# Patient Record
Sex: Female | Born: 1975 | Race: Black or African American | Hispanic: No | Smoking: Never smoker
Health system: Southern US, Community
[De-identification: ages and names within clinical notes are randomized; demographics above are authoritative.]

## PROBLEM LIST (undated history)

## (undated) ENCOUNTER — Inpatient Hospital Stay (HOSPITAL_COMMUNITY): Payer: Self-pay

## (undated) DIAGNOSIS — I8390 Asymptomatic varicose veins of unspecified lower extremity: Secondary | ICD-10-CM

## (undated) DIAGNOSIS — I472 Ventricular tachycardia, unspecified: Secondary | ICD-10-CM

## (undated) DIAGNOSIS — B999 Unspecified infectious disease: Secondary | ICD-10-CM

## (undated) DIAGNOSIS — I509 Heart failure, unspecified: Secondary | ICD-10-CM

## (undated) DIAGNOSIS — I4891 Unspecified atrial fibrillation: Secondary | ICD-10-CM

## (undated) DIAGNOSIS — J45909 Unspecified asthma, uncomplicated: Secondary | ICD-10-CM

## (undated) DIAGNOSIS — I4729 Other ventricular tachycardia: Secondary | ICD-10-CM

## (undated) DIAGNOSIS — I428 Other cardiomyopathies: Secondary | ICD-10-CM

## (undated) DIAGNOSIS — Z8719 Personal history of other diseases of the digestive system: Secondary | ICD-10-CM

## (undated) DIAGNOSIS — J42 Unspecified chronic bronchitis: Secondary | ICD-10-CM

## (undated) DIAGNOSIS — G43909 Migraine, unspecified, not intractable, without status migrainosus: Secondary | ICD-10-CM

## (undated) DIAGNOSIS — G4730 Sleep apnea, unspecified: Secondary | ICD-10-CM

## (undated) DIAGNOSIS — Z95810 Presence of automatic (implantable) cardiac defibrillator: Secondary | ICD-10-CM

## (undated) DIAGNOSIS — I5022 Chronic systolic (congestive) heart failure: Secondary | ICD-10-CM

## (undated) DIAGNOSIS — I10 Essential (primary) hypertension: Secondary | ICD-10-CM

## (undated) DIAGNOSIS — D649 Anemia, unspecified: Secondary | ICD-10-CM

## (undated) HISTORY — PX: REDUCTION MAMMAPLASTY: SUR839

## (undated) HISTORY — DX: Unspecified atrial fibrillation: I48.91

## (undated) HISTORY — DX: Migraine, unspecified, not intractable, without status migrainosus: G43.909

## (undated) HISTORY — DX: Heart failure, unspecified: I50.9

## (undated) HISTORY — DX: Unspecified infectious disease: B99.9

## (undated) HISTORY — DX: Unspecified asthma, uncomplicated: J45.909

## (undated) HISTORY — DX: Asymptomatic varicose veins of unspecified lower extremity: I83.90

## (undated) HISTORY — DX: Ventricular tachycardia: I47.2

## (undated) HISTORY — DX: Other ventricular tachycardia: I47.29

## (undated) HISTORY — PX: CARDIAC CATHETERIZATION: SHX172

## (undated) HISTORY — DX: Anemia, unspecified: D64.9

## (undated) HISTORY — DX: Other cardiomyopathies: I42.8

## (undated) HISTORY — DX: Ventricular tachycardia, unspecified: I47.20

## (undated) HISTORY — PX: CARDIAC DEFIBRILLATOR PLACEMENT: SHX171

## (undated) HISTORY — DX: Essential (primary) hypertension: I10

---

## 1993-07-27 HISTORY — PX: REDUCTION MAMMAPLASTY: SUR839

## 1994-03-05 HISTORY — PX: BREAST REDUCTION SURGERY: SHX8

## 1996-07-27 DIAGNOSIS — I8390 Asymptomatic varicose veins of unspecified lower extremity: Secondary | ICD-10-CM

## 1996-07-27 HISTORY — DX: Asymptomatic varicose veins of unspecified lower extremity: I83.90

## 1997-10-17 ENCOUNTER — Other Ambulatory Visit: Admission: RE | Admit: 1997-10-17 | Discharge: 1997-10-17 | Payer: Self-pay | Admitting: Obstetrics & Gynecology

## 1998-11-06 ENCOUNTER — Other Ambulatory Visit: Admission: RE | Admit: 1998-11-06 | Discharge: 1998-11-06 | Payer: Self-pay | Admitting: Obstetrics and Gynecology

## 1999-07-07 ENCOUNTER — Ambulatory Visit: Admission: RE | Admit: 1999-07-07 | Discharge: 1999-07-07 | Payer: Self-pay | Admitting: *Deleted

## 2000-01-08 ENCOUNTER — Ambulatory Visit (HOSPITAL_BASED_OUTPATIENT_CLINIC_OR_DEPARTMENT_OTHER): Admission: RE | Admit: 2000-01-08 | Discharge: 2000-01-08 | Payer: Self-pay | Admitting: Internal Medicine

## 2000-03-27 ENCOUNTER — Emergency Department (HOSPITAL_COMMUNITY): Admission: EM | Admit: 2000-03-27 | Discharge: 2000-03-27 | Payer: Self-pay | Admitting: *Deleted

## 2000-03-30 ENCOUNTER — Emergency Department (HOSPITAL_COMMUNITY): Admission: EM | Admit: 2000-03-30 | Discharge: 2000-03-30 | Payer: Self-pay | Admitting: Emergency Medicine

## 2000-03-30 ENCOUNTER — Encounter: Payer: Self-pay | Admitting: Emergency Medicine

## 2000-09-20 ENCOUNTER — Emergency Department (HOSPITAL_COMMUNITY): Admission: EM | Admit: 2000-09-20 | Discharge: 2000-09-20 | Payer: Self-pay | Admitting: Emergency Medicine

## 2000-12-23 ENCOUNTER — Encounter: Payer: Self-pay | Admitting: Emergency Medicine

## 2000-12-23 ENCOUNTER — Emergency Department (HOSPITAL_COMMUNITY): Admission: EM | Admit: 2000-12-23 | Discharge: 2000-12-23 | Payer: Self-pay | Admitting: Emergency Medicine

## 2001-10-25 ENCOUNTER — Other Ambulatory Visit: Admission: RE | Admit: 2001-10-25 | Discharge: 2001-10-25 | Payer: Self-pay | Admitting: Internal Medicine

## 2001-10-25 ENCOUNTER — Encounter: Admission: RE | Admit: 2001-10-25 | Discharge: 2001-10-25 | Payer: Self-pay | Admitting: Family Medicine

## 2001-11-29 ENCOUNTER — Encounter: Admission: RE | Admit: 2001-11-29 | Discharge: 2001-11-29 | Payer: Self-pay | Admitting: Sports Medicine

## 2001-12-15 ENCOUNTER — Encounter: Admission: RE | Admit: 2001-12-15 | Discharge: 2001-12-15 | Payer: Self-pay | Admitting: Family Medicine

## 2002-01-05 ENCOUNTER — Encounter: Admission: RE | Admit: 2002-01-05 | Discharge: 2002-01-05 | Payer: Self-pay | Admitting: Family Medicine

## 2002-01-25 ENCOUNTER — Encounter: Admission: RE | Admit: 2002-01-25 | Discharge: 2002-01-25 | Payer: Self-pay | Admitting: Family Medicine

## 2002-02-03 ENCOUNTER — Encounter: Admission: RE | Admit: 2002-02-03 | Discharge: 2002-02-03 | Payer: Self-pay | Admitting: Family Medicine

## 2002-02-13 ENCOUNTER — Encounter: Admission: RE | Admit: 2002-02-13 | Discharge: 2002-02-13 | Payer: Self-pay | Admitting: Family Medicine

## 2002-02-28 ENCOUNTER — Encounter: Admission: RE | Admit: 2002-02-28 | Discharge: 2002-02-28 | Payer: Self-pay | Admitting: Sports Medicine

## 2002-03-30 ENCOUNTER — Encounter: Admission: RE | Admit: 2002-03-30 | Discharge: 2002-03-30 | Payer: Self-pay | Admitting: Family Medicine

## 2002-04-07 ENCOUNTER — Encounter: Admission: RE | Admit: 2002-04-07 | Discharge: 2002-04-07 | Payer: Self-pay | Admitting: Family Medicine

## 2002-10-03 ENCOUNTER — Encounter: Admission: RE | Admit: 2002-10-03 | Discharge: 2002-10-03 | Payer: Self-pay | Admitting: Family Medicine

## 2002-10-04 ENCOUNTER — Encounter: Admission: RE | Admit: 2002-10-04 | Discharge: 2002-10-04 | Payer: Self-pay | Admitting: Family Medicine

## 2002-10-10 ENCOUNTER — Encounter: Admission: RE | Admit: 2002-10-10 | Discharge: 2002-10-10 | Payer: Self-pay | Admitting: Family Medicine

## 2002-10-16 ENCOUNTER — Ambulatory Visit (HOSPITAL_COMMUNITY): Admission: RE | Admit: 2002-10-16 | Discharge: 2002-10-16 | Payer: Self-pay | Admitting: Internal Medicine

## 2002-10-16 ENCOUNTER — Encounter: Payer: Self-pay | Admitting: Internal Medicine

## 2002-10-17 ENCOUNTER — Encounter: Admission: RE | Admit: 2002-10-17 | Discharge: 2002-10-17 | Payer: Self-pay | Admitting: Sports Medicine

## 2002-11-20 ENCOUNTER — Encounter: Admission: RE | Admit: 2002-11-20 | Discharge: 2003-02-18 | Payer: Self-pay | Admitting: Internal Medicine

## 2002-11-29 ENCOUNTER — Encounter: Admission: RE | Admit: 2002-11-29 | Discharge: 2002-11-29 | Payer: Self-pay | Admitting: Family Medicine

## 2003-01-04 ENCOUNTER — Encounter: Admission: RE | Admit: 2003-01-04 | Discharge: 2003-01-04 | Payer: Self-pay | Admitting: Family Medicine

## 2003-01-04 ENCOUNTER — Other Ambulatory Visit: Admission: RE | Admit: 2003-01-04 | Discharge: 2003-01-04 | Payer: Self-pay | Admitting: Family Medicine

## 2003-02-05 ENCOUNTER — Encounter: Admission: RE | Admit: 2003-02-05 | Discharge: 2003-02-05 | Payer: Self-pay | Admitting: Family Medicine

## 2003-03-15 ENCOUNTER — Encounter: Admission: RE | Admit: 2003-03-15 | Discharge: 2003-03-15 | Payer: Self-pay | Admitting: Family Medicine

## 2003-08-17 ENCOUNTER — Emergency Department (HOSPITAL_COMMUNITY): Admission: EM | Admit: 2003-08-17 | Discharge: 2003-08-17 | Payer: Self-pay

## 2003-08-20 ENCOUNTER — Encounter: Admission: RE | Admit: 2003-08-20 | Discharge: 2003-08-20 | Payer: Self-pay | Admitting: Family Medicine

## 2003-09-03 ENCOUNTER — Encounter: Admission: RE | Admit: 2003-09-03 | Discharge: 2003-09-03 | Payer: Self-pay | Admitting: Family Medicine

## 2003-09-12 ENCOUNTER — Ambulatory Visit (HOSPITAL_COMMUNITY): Admission: RE | Admit: 2003-09-12 | Discharge: 2003-09-12 | Payer: Self-pay | Admitting: Family Medicine

## 2003-09-12 ENCOUNTER — Encounter: Admission: RE | Admit: 2003-09-12 | Discharge: 2003-09-12 | Payer: Self-pay | Admitting: Family Medicine

## 2003-09-19 ENCOUNTER — Encounter: Payer: Self-pay | Admitting: Cardiology

## 2003-09-19 ENCOUNTER — Ambulatory Visit: Admission: RE | Admit: 2003-09-19 | Discharge: 2003-09-19 | Payer: Self-pay | Admitting: Internal Medicine

## 2003-09-27 ENCOUNTER — Encounter: Admission: RE | Admit: 2003-09-27 | Discharge: 2003-09-27 | Payer: Self-pay | Admitting: Family Medicine

## 2004-07-16 ENCOUNTER — Emergency Department (HOSPITAL_COMMUNITY): Admission: EM | Admit: 2004-07-16 | Discharge: 2004-07-16 | Payer: Self-pay | Admitting: Emergency Medicine

## 2005-01-06 ENCOUNTER — Encounter: Admission: RE | Admit: 2005-01-06 | Discharge: 2005-04-06 | Payer: Self-pay | Admitting: Internal Medicine

## 2005-07-27 HISTORY — PX: CARDIAC DEFIBRILLATOR PLACEMENT: SHX171

## 2006-01-24 DIAGNOSIS — I5022 Chronic systolic (congestive) heart failure: Secondary | ICD-10-CM

## 2006-01-24 DIAGNOSIS — I509 Heart failure, unspecified: Secondary | ICD-10-CM

## 2006-01-24 HISTORY — DX: Heart failure, unspecified: I50.9

## 2006-01-24 HISTORY — DX: Chronic systolic (congestive) heart failure: I50.22

## 2006-02-05 ENCOUNTER — Ambulatory Visit: Payer: Self-pay | Admitting: Internal Medicine

## 2006-02-05 ENCOUNTER — Inpatient Hospital Stay (HOSPITAL_COMMUNITY): Admission: EM | Admit: 2006-02-05 | Discharge: 2006-02-11 | Payer: Self-pay | Admitting: *Deleted

## 2006-02-05 ENCOUNTER — Encounter (INDEPENDENT_AMBULATORY_CARE_PROVIDER_SITE_OTHER): Payer: Self-pay | Admitting: Cardiology

## 2006-02-10 HISTORY — PX: INSERT / REPLACE / REMOVE PACEMAKER: SUR710

## 2006-03-03 ENCOUNTER — Ambulatory Visit: Payer: Self-pay

## 2006-04-29 ENCOUNTER — Encounter: Admission: RE | Admit: 2006-04-29 | Discharge: 2006-04-29 | Payer: Self-pay | Admitting: Internal Medicine

## 2006-07-13 ENCOUNTER — Ambulatory Visit: Payer: Self-pay | Admitting: Internal Medicine

## 2006-09-14 ENCOUNTER — Ambulatory Visit: Payer: Self-pay | Admitting: Internal Medicine

## 2007-06-28 ENCOUNTER — Ambulatory Visit: Payer: Self-pay | Admitting: Internal Medicine

## 2007-11-09 ENCOUNTER — Other Ambulatory Visit: Admission: RE | Admit: 2007-11-09 | Discharge: 2007-11-09 | Payer: Self-pay | Admitting: Obstetrics and Gynecology

## 2007-11-22 ENCOUNTER — Ambulatory Visit: Payer: Self-pay | Admitting: Internal Medicine

## 2008-04-06 ENCOUNTER — Ambulatory Visit: Payer: Self-pay | Admitting: Internal Medicine

## 2008-07-01 ENCOUNTER — Emergency Department (HOSPITAL_COMMUNITY): Admission: EM | Admit: 2008-07-01 | Discharge: 2008-07-01 | Payer: Self-pay | Admitting: Emergency Medicine

## 2008-07-10 ENCOUNTER — Ambulatory Visit: Payer: Self-pay | Admitting: Internal Medicine

## 2008-08-27 ENCOUNTER — Encounter: Payer: Self-pay | Admitting: Internal Medicine

## 2008-11-29 ENCOUNTER — Encounter: Payer: Self-pay | Admitting: Internal Medicine

## 2009-02-12 ENCOUNTER — Telehealth: Payer: Self-pay | Admitting: Internal Medicine

## 2009-08-27 DIAGNOSIS — I428 Other cardiomyopathies: Secondary | ICD-10-CM

## 2009-08-27 DIAGNOSIS — I4891 Unspecified atrial fibrillation: Secondary | ICD-10-CM | POA: Insufficient documentation

## 2009-08-27 DIAGNOSIS — I509 Heart failure, unspecified: Secondary | ICD-10-CM | POA: Insufficient documentation

## 2009-08-29 ENCOUNTER — Ambulatory Visit: Payer: Self-pay | Admitting: Internal Medicine

## 2009-08-30 DIAGNOSIS — J209 Acute bronchitis, unspecified: Secondary | ICD-10-CM

## 2009-09-10 ENCOUNTER — Encounter: Admission: RE | Admit: 2009-09-10 | Discharge: 2009-09-10 | Payer: Self-pay | Admitting: Internal Medicine

## 2009-12-16 ENCOUNTER — Encounter (INDEPENDENT_AMBULATORY_CARE_PROVIDER_SITE_OTHER): Payer: Self-pay | Admitting: *Deleted

## 2010-02-11 ENCOUNTER — Encounter: Payer: Self-pay | Admitting: Internal Medicine

## 2010-07-23 ENCOUNTER — Encounter (INDEPENDENT_AMBULATORY_CARE_PROVIDER_SITE_OTHER): Payer: Self-pay | Admitting: *Deleted

## 2010-08-25 ENCOUNTER — Encounter: Payer: Self-pay | Admitting: Internal Medicine

## 2010-08-28 NOTE — Assessment & Plan Note (Signed)
Summary: PC2  Medications Added TEKTURNA 300 MG TABS (ALISKIREN FUMARATE) Take one tablet by mouth daily DIOVAN HCT 320-25 MG TABS (VALSARTAN-HYDROCHLOROTHIAZIDE) Take one tablet by mouth once daily. FUROSEMIDE 20 MG TABS (FUROSEMIDE) 2 tablet two times a day ZANTAC 150 MG TABS (RANITIDINE HCL) once daily VITAMIN D (ERGOCALCIFEROL) 50000 UNIT CAPS (ERGOCALCIFEROL) 2 times per week FISH OIL   OIL (FISH OIL) 2 caps two times a day MULTIVITAMINS   TABS (MULTIPLE VITAMIN) UAD LOTREL 10-20 MG CAPS (AMLODIPINE BESY-BENAZEPRIL HCL) at bedtime DIGOXIN 0.25 MG TABS (DIGOXIN) Take one tablet by mouth daily CARVEDILOL 25 MG TABS (CARVEDILOL) Take 1 tablet by mouth once daily PROAIR HFA 108 (90 BASE) MCG/ACT AERS (ALBUTEROL SULFATE) UAD ALBUTEROL SULFATE (2.5 MG/3ML) 0.083% NEBU (ALBUTEROL SULFATE) UAD ZYRTEC ALLERGY 10 MG TABS (CETIRIZINE HCL) Take one tablet by mouth once daily. as needed ZITHROMAX TRI-PAK 500 MG TABS (AZITHROMYCIN) as directed      Allergies Added: ! PERCOCET  Visit Type:  Follow-up Primary Provider:  Kellie Shropshire, MD  CC:  chest pain.  History of Present Illness: [name] returns today for followup.  She is a very pleasant young woman with a history of nonischemic cardiomyopathy, class I-II congestive heart failure, status post ICD insertion.  She has a history of hypertensive heart disease.  She has moderate obesity.  She continues to gain weight.  She returns for followup.  She denies chest pain.  She has been bothered lately without URI type symptoms and now notes that she is coughing up brown thick sputum.  No fever or chills or night sweats.  Current Medications (verified): 1)  Tekturna 300 Mg Tabs (Aliskiren Fumarate) .... Take One Tablet By Mouth Daily 2)  Diovan Hct 320-25 Mg Tabs (Valsartan-Hydrochlorothiazide) .... Take One Tablet By Mouth Once Daily. 3)  Furosemide 20 Mg Tabs (Furosemide) .... 2 Tablet Two Times A Day 4)  Zantac 150 Mg Tabs (Ranitidine Hcl)  .... Once Daily 5)  Vitamin D (Ergocalciferol) 50000 Unit Caps (Ergocalciferol) .... 2 Times Per Week 6)  Fish Oil   Oil (Fish Oil) .... 2 Caps Two Times A Day 7)  Multivitamins   Tabs (Multiple Vitamin) .... Uad 8)  Lotrel 10-20 Mg Caps (Amlodipine Besy-Benazepril Hcl) .... At Bedtime 9)  Digoxin 0.25 Mg Tabs (Digoxin) .... Take One Tablet By Mouth Daily 10)  Carvedilol 25 Mg Tabs (Carvedilol) .... Take 1 Tablet By Mouth Once Daily 11)  Proair Hfa 108 (90 Base) Mcg/act Aers (Albuterol Sulfate) .... Uad 12)  Albuterol Sulfate (2.5 Mg/42ml) 0.083% Nebu (Albuterol Sulfate) .... Uad 13)  Zyrtec Allergy 10 Mg Tabs (Cetirizine Hcl) .... Take One Tablet By Mouth Once Daily. As Needed  Allergies (verified): 1)  ! Percocet  Past History:  Past Medical History: Last updated: 08/27/2009 Current Problems:  ATRIAL FIBRILLATION (ICD-427.31) AICD,  MEDTRONIC SPRINT QUATTRO SECURE, MODEL 5947 (ICD-V45.02) CHF (ICD-428.0) CARDIOMYOPATHY (ICD-425.4)    Past Surgical History: Last updated: 08/27/2009   DATE OF PROCEDURE:  02/10/2006  PROCEDURE PERFORMED: Implantation of AICD-Medtronic Sprint Quattro Secure, model 5947   PHYSICIAN:  Doylene Canning. Ladona Ridgel, M.D.  Review of Systems  The patient denies chest pain, syncope, and peripheral edema.    Vital Signs:  Patient profile:   35 year old female Height:      61 inches Weight:      245 pounds BMI:     46.46 Pulse rate:   97 / minute BP sitting:   158 / 102  (left arm)  Vitals Entered  By: Laurance Flatten CMA (August 29, 2009 3:11 PM)  Physical Exam  General:  Obese, well developed, well nourished, in no acute distress.  HEENT: normal. no maxillary tenderness. Neck: supple. No JVD. Carotids 2+ bilaterally no bruits Cor: RRR no rubs, gallops or murmur.  PMI is enlarged and laterally displaced. Lungs: CTA Ab: soft, nontender. nondistended. No HSM. Good bowel sounds Ext: warm. no cyanosis, clubbing or edema Neuro: alert and oriented. Grossly  nonfocal. affect pleasant     ICD Specifications Following MD:  Lewayne Bunting, MD     ICD Vendor:  Medtronic     ICD Model Number:  7232     ICD Serial Number:  [id number] ICD DOI:  02/10/2006     ICD Implanting MD:  Lewayne Bunting, MD  Lead 1:    Location: RV     DOI: 02/10/2006     Model #: 4098     Serial #: [id number]     Status: active  Indications::  NICM   ICD Follow Up Remote Check?  No Battery Voltage:  3.09 V     Charge Time:  8.44 seconds     ICD Dependent:  No       ICD Device Measurements Right Ventricle:  Amplitude: 17.1 mV, Impedance: 432 ohms, Threshold: 1.0 V at 0.5 msec Shock Impedance: 41/51 ohms   Episodes Shock:  0     ATP:  0     Nonsustained:  3      Brady Parameters Mode VVI     Lower Rate Limit:  40      Tachy Zones VF:  240     VT1:  182     Next Remote Date:  11/26/2009     Next Cardiology Appt Due:  08/27/2010 Tech Comments:  No parameter changes.  Device function normal.   Carelink transmissions every 3 months .  ROV 1 year Dr. Ladona Ridgel.  Checked by [employer]. Altha Harm, LPN  August 29, 2009 3:26 PM   MD Comments:  Agree with above.  Impression & Recommendations:  Problem # 1:  AICD,  MEDTRONIC SPRINT QUATTRO SECURE, MODEL 5947 (ICD-V45.02) Her device is working normally.  She has had no sustained VT or VF.  Will recheck in several months.  Problem # 2:  CHF (ICD-428.0) Continue current meds and maintain a low sodium diet. Her updated medication list for this problem includes:    Diovan Hct 320-25 Mg Tabs (Valsartan-hydrochlorothiazide) .Marland Kitchen... Take one tablet by mouth once daily.    Furosemide 20 Mg Tabs (Furosemide) .Marland Kitchen... 2 tablet two times a day    Lotrel 10-20 Mg Caps (Amlodipine besy-benazepril hcl) .Marland Kitchen... At bedtime    Digoxin 0.25 Mg Tabs (Digoxin) .Marland Kitchen... Take one tablet by mouth daily    Carvedilol 25 Mg Tabs (Carvedilol) .Marland Kitchen... Take 1 tablet by mouth once daily  Problem # 3:  ACUTE BRONCHITIS (ICD-466.0) Her symptoms are suggestive of  this.  She is scheduled to see her primary MD next week.  I will plan to treat her with a Z-Pack and ask that she followup with Dr. Renae Gloss next week as scheduled. Her updated medication list for this problem includes:    Proair Hfa 108 (90 Base) Mcg/act Aers (Albuterol sulfate) ..... Uad    Albuterol Sulfate (2.5 Mg/35ml) 0.083% Nebu (Albuterol sulfate) ..... Uad    Zithromax Tri-pak 500 Mg Tabs (Azithromycin) .Marland Kitchen... As directed  Patient Instructions: 1)  Your physician recommends that you schedule a follow-up appointment in: 12  months with Dr Ladona Ridgel Prescriptions: [name] TRI-PAK 500 MG TABS (AZITHROMYCIN) as directed  #1 x 0   Entered by:   Dennis Bast, RN, BSN   Authorized by:   Laren Boom, MD, [facility]   Signed by:   Dennis Bast, RN, BSN on 08/29/2009   Method used:   Electronically to        [facility] [phone]* (retail)       [address]       [address]       [place], [place]  [address]       Ph: [phone]       Fax: [phone]   RxID:   [id number]

## 2010-08-28 NOTE — Letter (Signed)
Summary: Device-Delinquent Phone Journalist, newspaper, Main Office  [address]   [place], [place] [address]   Phone: [phone]  Fax: [phone]     February 11, 2010 MRN: [id number]   [name] [address] [place], [place]  [address]   Dear Ms. [name],  According to our records, you were scheduled for a device phone transmission on 11-26-09.     We did not receive any results from this check.  If you transmitted on your scheduled day, please call us to help troubleshoot your system.  If you forgot to send your transmission, please send one upon receipt of this letter.  Thank you,   Architectural technologist [facility] Clinic

## 2010-08-28 NOTE — Letter (Signed)
Summary: Device-Delinquent Check  [facility], Main Office  [address]   [place], [place] [address]   Phone: [phone]  Fax: [phone]     July 23, 2010 MRN: [id number]   SHANIKIA KERNODLE [address] [place], [place]  [address]   Dear Ms. [name],  According to our records, you have not had your implanted device checked in the recommended period of time.  We are unable to determine appropriate device function without checking your device on a regular basis.  Please call our office to schedule an appointment as soon as possible.  If you are having your device checked by another physician, please call us so that we may update our records.  Thank you,  Vella Kohler  July 23, 2010 8:45 AM   [facility] certified

## 2010-08-28 NOTE — Letter (Signed)
Summary: Device-Delinquent Phone Journalist, newspaper, Main Office  [address]   [place], [place] [address]   Phone: [phone]  Fax: [phone]     Dec 16, 2009 MRN: [id number]   [name] [address] [place], [place]  [address]   Dear Ms. [name],  According to our records, you were scheduled for a device phone transmission on                              .     We did not receive any results from this check.  If you transmitted on your scheduled day, please call us to help troubleshoot your system.  If you forgot to send your transmission, please send one upon receipt of this letter.  Thank you,   Architectural technologist Device Clinic

## 2010-09-17 NOTE — Cardiovascular Report (Signed)
Summary: Certified Letter Returned (RTS - UTF)  Certified Letter Returned (RTS - UTF)   Imported By: Debby Freiberg 09/08/2010 11:25:41  _____________________________________________________________________  External Attachment:    Type:   Image     Comment:   External Document

## 2010-10-02 ENCOUNTER — Ambulatory Visit (INDEPENDENT_AMBULATORY_CARE_PROVIDER_SITE_OTHER): Payer: BC Managed Care – PPO | Admitting: Internal Medicine

## 2010-10-02 ENCOUNTER — Encounter: Payer: Self-pay | Admitting: Internal Medicine

## 2010-10-02 DIAGNOSIS — I428 Other cardiomyopathies: Secondary | ICD-10-CM

## 2010-10-02 DIAGNOSIS — E669 Obesity, unspecified: Secondary | ICD-10-CM

## 2010-10-02 DIAGNOSIS — I472 Ventricular tachycardia: Secondary | ICD-10-CM

## 2010-10-02 DIAGNOSIS — I5022 Chronic systolic (congestive) heart failure: Secondary | ICD-10-CM

## 2010-10-07 NOTE — Assessment & Plan Note (Signed)
Summary: FOLLOW UP - 1 YEAR/per pt call=mj  Medications Added BENICAR HCT 40-25 MG TABS (OLMESARTAN MEDOXOMIL-HCTZ) once daily FUROSEMIDE 20 MG TABS (FUROSEMIDE) 1 tablet two times a day VITAMIN D 2000 UNIT TABS (CHOLECALCIFEROL) once daily ZYRTEC ALLERGY 10 MG TABS (CETIRIZINE HCL) Take one tablet by mouth once daily. as needed MAGNESIUM OXIDE 400 MG TABS (MAGNESIUM OXIDE) once daily COENZYME Q10 100 MG CAPS (COENZYME Q10) once daily VITAMIN C 500 MG  TABS (ASCORBIC ACID) once daily        Visit Type:  Follow-up Primary Provider:  Kellie Shropshire, MD   History of Present Illness: Mrs. [name] returns today for followup. She is a pleasant woman with an DCM, severe LV dysfunction, class 2 CHF and obesity. She has not had any ICD shocks. She has been frustrated by her inabililty to lose weight. She has reduced her caloric intake and is trying to exercise several times a week. Minimal peripheral edema. She has had problems with sodium indiscretion in the past but now is trying to improve.  Current Medications (verified): 1)  Tekturna 300 Mg Tabs (Aliskiren Fumarate) .... Take One Tablet By Mouth Daily 2)  Benicar Hct 40-25 Mg Tabs (Olmesartan Medoxomil-Hctz) .... Once Daily 3)  Furosemide 20 Mg Tabs (Furosemide) .Marland Kitchen.. 1 Tablet Two Times A Day 4)  Zantac 150 Mg Tabs (Ranitidine Hcl) .... Once Daily 5)  Vitamin D 2000 Unit Tabs (Cholecalciferol) .... Once Daily 6)  Fish Oil   Oil (Fish Oil) .... 2 Caps Two Times A Day 7)  Multivitamins   Tabs (Multiple Vitamin) .... Uad 8)  Lotrel 10-20 Mg Caps (Amlodipine Besy-Benazepril Hcl) .... At Bedtime 9)  Digoxin 0.25 Mg Tabs (Digoxin) .... Take One Tablet By Mouth Daily 10)  Carvedilol 25 Mg Tabs (Carvedilol) .... Take 1 Tablet By Mouth Once Daily 11)  Proair Hfa 108 (90 Base) Mcg/act Aers (Albuterol Sulfate) .... Uad 12)  Albuterol Sulfate (2.5 Mg/70ml) 0.083% Nebu (Albuterol Sulfate) .... Uad 13)  Zyrtec Allergy 10 Mg Tabs (Cetirizine Hcl) ....  Take One Tablet By Mouth Once Daily. As Needed 14)  Magnesium Oxide 400 Mg Tabs (Magnesium Oxide) .... Once Daily 15)  Coenzyme Q10 100 Mg Caps (Coenzyme Q10) .... Once Daily 16)  Vitamin C 500 Mg  Tabs (Ascorbic Acid) .... Once Daily  Allergies: 1)  ! Percocet  Past History:  Past Medical History: Last updated: 08/27/2009 Current Problems:  ATRIAL FIBRILLATION (ICD-427.31) AICD,  MEDTRONIC SPRINT QUATTRO SECURE, MODEL 5947 (ICD-V45.02) CHF (ICD-428.0) CARDIOMYOPATHY (ICD-425.4)    Past Surgical History: Last updated: 08/27/2009   DATE OF PROCEDURE:  02/10/2006  PROCEDURE PERFORMED: Implantation of AICD-Medtronic Sprint Quattro Secure, model 5947   PHYSICIAN:  Doylene Canning. Ladona Ridgel, M.D.  Review of Systems       The patient complains of dyspnea on exertion.  The patient denies chest pain, syncope, and peripheral edema.    Vital Signs:  Patient profile:   35 year old female Height:      61 inches Weight:      253 pounds BMI:     47.98 Pulse rate:   74 / minute BP sitting:   144 / 86  (left arm)  Vitals Entered By: Laurance Flatten CMA (October 02, 2010 2:52 PM)  Physical Exam  General:  Obese, well developed, well nourished, in no acute distress.  HEENT: normal. no maxillary tenderness. Neck: supple. No JVD. Carotids 2+ bilaterally no bruits Cor: RRR no rubs, gallops or murmur.  PMI is enlarged and  laterally displaced. Lungs: CTA Ab: soft, nontender. nondistended. No HSM. Good bowel sounds Ext: warm. no cyanosis, clubbing or edema Neuro: alert and oriented. Grossly nonfocal. affect pleasant     ICD Specifications Following MD:  Lewayne Bunting, MD     ICD Vendor:  Medtronic     ICD Model Number:  7232     ICD Serial Number:  [id number] ICD DOI:  02/10/2006     ICD Implanting MD:  Lewayne Bunting, MD  Lead 1:    Location: RV     DOI: 02/10/2006     Model #: 4098     Serial #: [id number]     Status: active  Indications::  NICM   ICD Follow Up ICD Dependent:  No      Brady  Parameters Mode VVI     Lower Rate Limit:  40      Tachy Zones VF:  240     VT1:  182     MD Comments:  NOrmal device function.  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 Will follow. Her updated medication list for this problem includes:    Lotrel 10-20 Mg Caps (Amlodipine besy-benazepril hcl) .Marland Kitchen... At bedtime    Carvedilol 25 Mg Tabs (Carvedilol) .Marland Kitchen... Take 1 tablet by mouth once daily  Problem # 2:  AICD,  MEDTRONIC SPRINT QUATTRO SECURE, MODEL 5947 (ICD-V45.02) Her device is working normally. Will recheck in several months.  Problem # 3:  CHF (ICD-428.0) Her chronic systolic CHF is class 2. She will continue her current meds and maintain a low sodium diet. Her updated medication list for this problem includes:    Benicar Hct 40-25 Mg Tabs (Olmesartan medoxomil-hctz) ..... Once daily    Furosemide 20 Mg Tabs (Furosemide) .Marland Kitchen... 1 tablet two times a day    Lotrel 10-20 Mg Caps (Amlodipine besy-benazepril hcl) .Marland Kitchen... At bedtime    Digoxin 0.25 Mg Tabs (Digoxin) .Marland Kitchen... Take one tablet by mouth daily    Carvedilol 25 Mg Tabs (Carvedilol) .Marland Kitchen... Take 1 tablet by mouth once daily  Patient Instructions: 1)  Your physician wants you to follow-up in: 12 months with Dr Court Ireta will receive a reminder letter in the mail two months in advance. If you don't receive a letter, please call our office to schedule the follow-up appointment. 2)  Your physician recommends that you continue on your current medications as directed. Please refer to the Current Medication list given to you today.

## 2010-10-14 NOTE — Cardiovascular Report (Signed)
Summary: Office Visit   Office Visit   Imported By: Roderic Ovens 10/08/2010 11:48:32  _____________________________________________________________________  External Attachment:    Type:   Image     Comment:   External Document

## 2010-12-09 NOTE — Assessment & Plan Note (Signed)
[facility]                         ELECTROPHYSIOLOGY OFFICE NOTE   [name], [name]                       MRN:          [id number]  DATE:06/28/2007                            DOB:          09-26-1975    [name] returns today for followup visit.  She is a very pleasant,  young woman with history of severe hypertension and a hypertensive  cardiomyopathy with EF of 20%.  She returns today for followup.  She is  status post ICD insertion back in July of 2007.  She has recently  married and has joined a gym and has been exercising some.  Unfortunately, in the last year her weight has gone up almost 25 pounds.  She denies chest pain or shortness of breath.  She denies peripheral  edema.   PHYSICAL EXAMINATION:  GENERAL:  She is a pleasant, well-appearing,  obese woman in no acute distress.  VITAL SIGNS:  Blood pressure 170/110, pulse 80 and regular.  Respirations 18.  Weight 246 pounds.  The patient states that she has  been off her medications as she lost them.   MEDICATIONS:  1. Diovan/HCTZ 20/25.  2. Digoxin 0.25 mg daily.  3. Lotrel 10/80 mg.  4. Tekturna.  5. Carvedilol unknown dose.  She does not know it.  She does not have      her medicines with her today.   Interrogation of her defibrillator demonstrates a Medtronic Maximo with  R waves at 17.  The impedance was 464.  The threshold of 0.5.  Battery  voltage 3.19 volts.  Underlying rhythm is sinus.  No intercurrent  __________  .   IMPRESSION:  1. Nonischemic cardiomyopathy.  2. Congestive heart failure.  3. Severe hypertension.  4. Obesity.   DISCUSSION:  Overall [name] is stable but her blood pressure  remains elevated.  I suspect some noncompliance.  I have encouraged her  to continue exercising and we talked about the importance of not  overdoing it and not using heavy weights.  I will plan to see her back  in the office in one year.    Doylene Canning. Ladona Ridgel, MD  Electronically Signed   GWT/MedQ  DD: 06/28/2007  DT: 06/28/2007  Job #: [id number]   cc:   Merlene Laughter. Renae Gloss, M.D.

## 2010-12-09 NOTE — Assessment & Plan Note (Signed)
[facility]                         ELECTROPHYSIOLOGY OFFICE NOTE   NAME:[name], Briana MCQUAIG                       MRN:          [id number]  DATE:07/10/2008                            DOB:          04/26/1976    [name] returns today for followup.  She is a very pleasant young  woman with a history of nonischemic cardiomyopathy, class I-II  congestive heart failure, status post ICD insertion.  She has a history  of hypertensive heart disease.  She has moderate obesity.  She continues  to gain weight.  She returns for followup.  She denies chest pain.  She  denies shortness of breath.  She has rare palpitations which are almost  certainly PVCs and nonsustained VT.   MEDICATIONS:  1. Diovan/HCTZ 320/25 a day.  2. Digoxin 0.25 mg daily.  3. Lotrel 10/20 a day.  4. Tekturna 300 a day.  5. Zantac 150 a day.  6. Furosemide 20 twice a day.  7. Coreg 25 daily.  8. She is also on Cipro and Flagyl.   PHYSICAL EXAMINATION:  GENERAL:  She is a pleasant well-appearing young  woman in no distress.  VITAL SIGNS:  Blood pressure was 153/95, pulse 90 and regular, and  respirations were 18.  The weight was 240 pounds.  NECK:  No jugular venous distention.  LUNGS:  Clear bilaterally to auscultation.  No wheezes, rales, or  rhonchi are present.  There is no increased work of breathing.  CARDIOVASCULAR:  Regular rate and rhythm.  Normal S1 and S2.  There is  soft S4 gallop.  There are no murmurs.  ABDOMEN:  Soft and nontender.  There is no organomegaly.  EXTREMITIES:  No cyanosis, clubbing, or edema.  Thee pulses are 2+ and  symmetric.   Interrogation of her defibrillator demonstrates a Medtronic Maximo.  R-  waves were 17.  The impedance was 432.  The threshold was 1 volt at 0.5.  Battery voltage was 3.14 volts.  There is no intercurrent ICD therapies.   IMPRESSION:  1. Nonischemic cardiomyopathy.  2. Congestive heart failure.  3. Status post implantable  cardioverter-defibrillator insertion.   DISCUSSION:  Overall, [name] is stable.  She has had no syncopal  episodes.  Her blood pressures remains elevated.  We talked about ways  to try to lose weight and I have encouraged her to maintain a low-salt  diet.  I will see her back for ICD followup in 1 year.     Doylene Canning. Ladona Ridgel, MD  Electronically Signed    GWT/MedQ  DD: 07/10/2008  DT: 07/10/2008  Job #: [id number]

## 2010-12-12 NOTE — Op Note (Signed)
NAMEMEYER, DOCKERY                ACCOUNT NO.:  [id number]   MEDICAL RECORD NO.:  [id number]          PATIENT TYPE:  INP   LOCATION:  3731                         FACILITY:  [facility]   PHYSICIAN:  Doylene Canning. Ladona Ridgel, M.D.  DATE OF BIRTH:  [birth year]   DATE OF PROCEDURE:  02/10/2006  DATE OF DISCHARGE:                                 OPERATIVE REPORT   PROCEDURE PERFORMED:  Single chamber ICD implantation.   INDICATION:  Syncope with a nonischemic cardiomyopathy and EF of 25%.   INTRODUCTION:  The patient is a 35 year old woman with a history of severe  hypertension and congestive heart failure with EFs dating back to 2004 of 15-  20%.  The patient was in her usual state of health when she was admitted to  the hospital after having a syncopal episode.  She was unresponsive and  foaming at the mouth.  She had negative EEG which suggested that this was  not a seizure.  With her severe LV dysfunction and syncope, she is now  referred for ICD implantation. Of note, catheterization demonstrated no  coronary disease.   PROCEDURE:  After informed consent was obtained, the patient was taken to  the diagnostic EP lab in a fasting state.  After the usual preparation and  draping, intravenous fentanyl and midazolam were given for sedation.  30 mL  lidocaine was infiltrated in the left infraclavicular region.  An 8 cm  incision was carried out over this region.  Electrocautery was utilized to  dissect down to the fascial plane.  The left subclavian vein was  demonstrated be patent after 10 mL of contrast was injected into it.  The  Medtronic Sprint [place], model 5621, 65-cm defibrillation lead,  serial number [id number], was advanced into the right ventricle and mapping  was carried out at the final site on the RV apex.  The R-waves measured 30  mV where a pacing impedance of 699 ohms.  The lead was actively fixed.  The  pacing threshold was 0.5 volts at 0.5 milliseconds and 10 volts pacing  did  not stimulate the diaphragm.  With the ventricular lead in satisfactory  position, it was secured to the subpectoralis fascia with a figure-of-eight  silk suture.  The sewing sleeve was also secured with a silk suture.  Electrocautery was then utilized to make a subcutaneous pocket.  Kanamycin  irrigation was utilized to irrigate the pocket.  Electrocautery was utilized  to assure hemostasis.  The Medtronic Maximo VR, model [id number], single chamber  defibrillator, serial number [id number], was connected to the defibrillation  lead and placed in the subcutaneous pocket.  The generator was secured with  a silk suture.  The patient was more deeply sedated and defibrillation  threshold testing was carried out.   After the patient was more deeply sedated with fentanyl and Versed, VF was  induced with a T-wave shock and a 15 joule shock was delivered which  terminated VF and restored sinus rhythm.  Five minutes was allowed to elapse  and a second defibrillation threshold test carried out.  Again, VF was  induced with T-wave shock and a 15 joules shock was delivered, this time  failing to terminate VF.  The device recharged and delivered a 25 joule  shock which did, in fact, terminate VF and restored sinus rhythm.  At this  point, no additional defibrillation threshold testing was carried out and  the incision was closed with a layer of 2-0 Vicryl followed by a layer of 3-  0 Vicryl followed by a layer of 4-0 Vicryl.  Benzoin was painted on the  skin, Steri-Strips were applied, a pressure dressing was placed, and the  patient was returned to her room in satisfactory condition.   COMPLICATIONS:  There were no immediate procedure complications.   RESULTS:  This demonstrates successful implantation of a Medtronic single  chamber defibrillator in a patient with syncope and nonischemic  cardiomyopathy, EF 20%.           ______________________________  Doylene Canning. Ladona Ridgel, M.D.     GWT/MEDQ  D:   02/10/2006  T:  02/10/2006  Job:  [id number]   cc:   Cristy Hilts. Jacinto Halim, MD  Fax: [phone]   Merlene Laughter. Renae Gloss, M.D.  Fax: [phone]

## 2010-12-12 NOTE — Assessment & Plan Note (Signed)
[facility]                           ELECTROPHYSIOLOGY OFFICE NOTE   Briana, [name]                       MRN:          [id number]  DATE:03/03/2006                            DOB:          1976/07/27    [name] was seen today in the clinic on March 03, 2006 for a wound  check of her newly implanted Medtronic model [id number] Maximo. Date of implant  was February 10, 2006 for nonischemic cardiomyopathy.   On interrogation of her device today, her battery voltage is 3.23 with a  charge time of 8.51 seconds. R waves measured 16.6 millivolts with a  ventricular pacing threshold of 1 volt at 0.4 milliseconds and a ventricular  lead impedance of 368. Shock impedance was 39. There were no episodes since  implant date. I did program onset on, her Steri-Strips were removed, her  wound was without redness or edema. She was enrolled in [facility] today and  will return in November to see Dr. Ladona Ridgel.                                   Altha Harm, LPN                                Doylene Canning. Ladona Ridgel, MD   PO/MedQ  DD:  03/03/2006  DT:  03/03/2006  Job #:  [id number]

## 2010-12-12 NOTE — Procedures (Signed)
EEG number [id number]     This is a 35 year old African-American female with a history of new-onset  seizure on February 04, 2006.  The patient has a history of congestive heart  failure, asthma, hypertension and obesity.  Medicines in the past include  Advair Diskus, Coreg, digoxin, Diovan, Lotrel and nitroglycerin.   TECHNICAL DESCRIPTION:  This EEG was recorded during the awake and during  the stage II sleep states.  The background activity shows 0 Hz rhythms with  higher amplitudes seen in the posterior head regions bilaterally.  There was  no evidence of any focal asymmetry present during this EEG.  Photic  stimulation was performed, which did not produce a driving response in the  occipital head regions.  No evidence of any focal asymmetry was seen.  There  was stage II sleep.  Hyperventilation testing was performed with no  abnormalities seen.   IMPRESSION:  This is a normal EEG during the awake and stage II sleep  states.           ______________________________  Genene Churn. Sandria Manly, M.D.     [id number]  D:  02/05/2006 14:02:56  T:  02/05/2006 15:56:45  Job #:  [id number]   cc:   [facility]

## 2010-12-12 NOTE — Discharge Summary (Signed)
NAMEMARA, [name]                ACCOUNT NO.:  [id number]   MEDICAL RECORD NO.:  [id number]          PATIENT TYPE:  INP   LOCATION:  3706                         FACILITY:  [facility]   PHYSICIAN:  Lonia Blood, M.D.      DATE OF BIRTH:  12-11-1975   DATE OF ADMISSION:  02/04/2006  DATE OF DISCHARGE:  02/11/2006                                 DISCHARGE SUMMARY   PRIMARY CARE PHYSICIAN:  Dr. Andi Devon.   DISCHARGE DIAGNOSES:  1.  Seizure versus syncope presume.  2.  Arrhythmias status post ICD insertion on February 11, 2006.  3.  Nonischemic cardiomyopathy with an EF of 25 to 30%.  4.  Iron deficiency anemia.  5.  Class 3 CHF.  6.  Asthma which is stable.  7.  Dyslipidemia.  8.  Hypertension.  9.  Transient hypokalemia.  10. Obesity.   DISCHARGE MEDICATIONS:  1.  Multivitamin daily.  2.  Digoxin 250 mcg daily.  3.  AcipHex 20 mg daily.  4.  Coreg CR 40 mg daily.  5.  Lotrel 10/20 one tablet daily.  6.  Tekturna 150 mg p.o. daily.  7.  Advair 50/100 one inhalation twice a day.  8.  Albuterol inhaler as needed.  9.  Albuterol nebulizer also as needed.  10. Diovan HCT 320 25 mg one tablet daily.  11. The patient also on Lasix 20 mg daily.  12. Aldactone 25 mg daily.  13. Nu-Iron 150 mg daily.   DISPOSITION:  The patient has a new ICD placed.  She will have a followup  with EP in about three weeks.  She will also have a followup with Dr.  Renae Gloss as an outpatient.   PROCEDURES PERFORMED THIS ADMISSION:  1.  Head CT without contrast performed on February 04, 2006 showed negative for      acute ethmoid sinus inflammatory changes.  2.  2D echo February 05, 2006 showed an EF of about 25%.  Left ventricular wall      thickness was increased and a pattern of severe concentric hypertrophy.      Features consistent with a normal left ventricular-filling pattern with      concomitant abnormal early detection and increased filling pressure.  3.  An ultrasound of the abdomen on February 06, 2006 showed no acute      abnormality.  There was probably tiny gall bladder polyp.  4.  A chest x-ray February 10, 2006 showed stable cardiomegaly, no acute      disease.  5.  Cardiac cath performed on February 08, 2006 showed normal coronary anatomy      and normal renal arteries.  6.  Insertion of ICD performed on February 10, 2006.  Currently stable.   CONSULTATIONS:  [facility] and EP.  Dr. Lewayne Bunting.   HISTORY AND PHYSICAL:  Please refer to my dictated history and physical on  February 05, 2006.  This is a pleasant 34 year old female with known  cardiomyopathy and asthma who presented with sudden onset of observed tonic  clonic seizure and altered mental  status on February 05, 2006.  Episode occurred  abruptly while she was having what she considered an acute asthma attack.  The patient was seen in the ED.  She was awake, alert at the time and no  postictal symptoms.  Based on her advance cardiomyopathy, it was not clear  whether this was a true seizure since she had no history of seizure disorder  or a syncopal episode manifested in a seizure.  She was subsequently  admitted for further workup.   HOSPITAL COURSE:  1.  Seizure, syncope.  The patient's workup was complete.  She had prolactin      level.  She had a head CT, et Karie Soda checked and no evidence of real      seizure.  EEG done was also negative.  However, with her dilated      cardiomyopathy, it is presumed the patient most likely had what we call      sudden cardiac death.  Probably she went into some form of arrhythmia      related to her passing out and then having syncope.  With this in mind,      she was evaluated.  She had a cath done that is clean.  Subsequently, EP      was asked to see patient.  An ICD is placed based on her very low EF of      25% and her symptomatology.  Hopefully this will keep the patient out of      trouble.  2.  Non-ischemic cardiomyopathy.  The patient's EF is somewhere  around 25%.      She is, however, compensated at this point.  Further adjustment to her      medication was done including increasing her Coreg dose now to 80 mg      daily of the CR.  The patient was originally on 40 mg.  She also had      Lasix and Aldactone added based on her EF.  Now she has an ICD in place.  3.  Iron-deficiency anemia.  The patient had a workup done here for her      anemia that was observed on admission.  It appears that she has iron      deficiency with the following findings:  Her retic count is only 1.9%,      total iron is only 14.  Percent saturation was only 4%.  Her TIBC normal      is at 353.  Ferritin is only 21.  In fact, she has been started on iron      therapy this hospitalization.  Her hemoglobin has been fairly consistent      around 10 at this point.  4.  Asthma.  The patient was maintained on her home medication with no      change.  She did not have any asthma attack here.  She was on steroid at      home but we took her off that.  5.  Dyslipidemia.  She is currently not on treatment.  Her fasting lipid      panel is as follows:  Total cholesterol of 201, triglycerides 38, HDL      42, her LDL is 151.  The patient will need to be on some form of statin      hopefully as an outpatient to help with control of her lipids although      she has no ischemic cardiomyopathy at this point.  The patient is      otherwise stable for discharge.      Lonia Blood, M.D.  Electronically Signed     LG/MEDQ  D:  02/11/2006  T:  02/11/2006  Job:  [id number]   cc:   Merlene Laughter. Renae Gloss, M.D.  Fax: [phone]

## 2010-12-12 NOTE — H&P (Signed)
[name], [name]                ACCOUNT NO.:  [id number]   MEDICAL RECORD NO.:  [id number]          PATIENT TYPE:  INP   LOCATION:  3731                         FACILITY:  [facility]   PHYSICIAN:  Lonia Blood, M.D.      DATE OF BIRTH:  1976-07-03   DATE OF ADMISSION:  02/04/2006  DATE OF DISCHARGE:                                HISTORY & PHYSICAL   PRIMARY CARE PHYSICIAN:  Merlene Laughter. Renae Gloss, M.D.   PRESENTING COMPLAINT:  Seizure.   HISTORY OF THE PRESENT ILLNESS:  The patient is a 35 year old female with a  history of hypertension and CHF with systolic dysfunction who apparently was  doing all right until today when she started having shortness of breath.  The patient  actually has had shortness of breath that has been going on for  about a week, which she believed was due to her asthma.  She called her  primary care physician who started her on a Prednisone Pak today.  She was  having problems breathing with coughing and one of her friends tapped her  back .  That was the last thing the patient remembers before she passed out.  Along with her weakness the patient was having tonic clonic activity.  Her  mouth was foaming, and she had clenched jaws and teeth.  They had to pry her  mouth open to prevent her from biting her.  A friend actually got their hand  bitten due to that.  She was subsequently brought into the emergency room.   The patient is currently fully awake and alert, and able to give a history  on her own.  The patient denies any fever or any palpitations.  She has  baseline shortness of breath that has not changed.  She currently is not  wheezing.   PAST MEDICAL HISTORY:  The patient's past medical history is significant  for:  1.  CHF with an EF of 10-15% back in 2004; currently it is in the 40s.  She      is followed by Dr. Jacinto Halim.  2.  The patient has a history of  hypertension diagnosed at the age of 80      and she has been on treatment since that time.  3.   History of asthma.  4.  Obesity.   ALLERGIES:  The patient is ALLERGIC TO PERCOCET.   MEDICATIONS:  1.  AcipHex 20 mg daily.  2.  Advair Diskus 250/50, 1 puff twice a day.  3.  Albuterol inhaler.  4.  Coreg 40 mg daily.  5.  Digoxin 250 mcg daily.  6.  Diovan/hydrochlorothiazide 320/25 mg daily.  7.  Lotrel 10/20 daily.  8.  Multivitamins.  9.  Prednisone Pak.   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  No tobacco or alcohol usage.  She has been fairly active and has  been increasing her activities slowly.   FAMILY HISTORY:  The family history is significant for hypertension and also  heart disease.   REVIEW OF SYSTEMS:  The  12-point review of systems is performed and is  notable for that which is mentioned in the history of present illness.   PHYSICAL EXAMINATION:  VITAL SIGNS: On physical exam temperature is 97.1,  blood pressure 195/123, pulse 78, respiratory rate 20, and O2 sat  93% on  room air.  GENERAL APPEARANCE:  Generally she is alert, awake and oriented, and able to  give a history; and, she is in no acute distress.  HEENT:  The patient has bilateral subconjunctival hemorrhages on the lateral  aspect of the eyeball.  She seems to have an exophthalmos kind of look.  PERL.  EOMI.  No visible  tear on her tongue.  No  visible scratches.  NECK:  The neck is supple.  No JVD.  No lymphadenopathy.  CHEST AND LUNGS:  Respiratory wise she has good air entry bilaterally.  There are no audible wheezes.  HEART:  Cardiovascular system showed S1 and S2 with no audible murmurs.  ABDOMEN:  The abdomen is soft and nontender with positive bowel sounds.  EXTREMITIES:  The extremities show trace edema, but no cyanosis.   LABORATORY DATA:  Initial labs shows sodium 141, potassium 3.5, chloride  104, BUN 16, glucose 143, bicarb 27, creatinine 0.9.  Hemoglobin 12.  Pregnancy test is negative.  Head CT without contrast is negative for  anything  acute.   ASSESSMENT:  This is a 35 year old female with a history of congestive heart  failure who presents with what appears to be absence seizure.  The patient  may also have had a syncope episode primarily as the differential.  It is  not clear as to what the trigger was.  She has no prior history of seizure.  I suspect that this could be hypoxic-related versus hypertensive margin,  especially with her blood pressure being 195/123 upon arrival; and, it may  have been more than 200 at the time of the episode.   PLAN:  The plan therefore will be:  1.  Seizure versus syncope.   We will admit the patient for at least a 23-hour observation period.  We  will rule out cardiac cause by placing her on telemetry and monitoring her  telemetry.  We will check her cardiac enzymes serially.  We will repeat the  electrocardiogram, which at this point shows normal sinus rhythm with some  evidence of left atrial enlargement and some flattening of the lateral T  waves.  We will also check an electroencephalogram and serum prolactin level  to rule out possible seizure.  We will place her on seizure precautions for  now.   1.  Congestive heart failure.  The patient's B-natriuretic peptide is more than 1400; however, it is not  clear if this is her baseline B-natriuretic peptide.   We will ask Dr. Verl Dicker office regarding this.  I will keep her on her home  medications and consider  placing her on some baseline Lasix to help with  her blood pressure and her congestive heart failure.   1.  Asthma.  The patient is not wheezing at his point so I suspect this may not be  triggered by her asthma.   I therefore will not give her any steroids, except for her Advair Diskus.   1.  Hypertension.  The patient's blood pressure has come down some with treatment.   I will continue her home medications and I will add nitroglycerin.  We will monitor her ventriculoperitoneal closely.   1.  The patient's other  medical problems seem to be stable.   I will follow the patient closely while she is in the hospital.      Lonia Blood, M.D.  Electronically Signed     LG/MEDQ  D:  02/05/2006  T:  02/05/2006  Job:  [id number]

## 2010-12-12 NOTE — Cardiovascular Report (Signed)
NAMEALIVIYA, SCHOELLER                ACCOUNT NO.:  [id number]   MEDICAL RECORD NO.:  [id number]          PATIENT TYPE:  INP   LOCATION:  3731                         FACILITY:  [facility]   PHYSICIAN:  Cristy Hilts. Jacinto Halim, MD       DATE OF BIRTH:  May 10, 1976   DATE OF PROCEDURE:  02/08/2006  DATE OF DISCHARGE:                              CARDIAC CATHETERIZATION   PROCEDURE PERFORMED:  1. Left and right heart catheterization.  2. Abdominal aortogram.   INDICATIONS:  [name] is a 35 year old female with history of  hypertension and cardiomyopathy with ejection fraction on 20% by  echocardiogram done about 2 to 3 years ago. Her EF about 6 to 8 months ago  had improved to 45%. However, she was readmitted to the hospital on February 05, 2006 with episode of seizure and syncope. It was felt that this was probably  related to VT VF arrest. She had an echocardiogram which again revealed  severe LV systolic dysfunction. Given this, she was brought to the cardiac  catheterization lab for left and right heart catheterization to evaluate for  reversible causes for her cardiomyopathy. The right heart catheterization  was performed to evaluate for cardiac output and cardiac index and also to  evaluate for pulmonary hypertension given her morbid obesity and obstructive  sleep apnea. Abdominal aortogram was performed to evaluate for renal artery  stenosis as she had difficult to control hypertension.   HEMODYNAMIC DATA:  Right heart catheterization:  RA pressures were 12/10  with a mean of 9 mmHg.   RV pressures 38/3 with an end diastolic pressure of 5 mmHg.   PA pressures 43/72 with a mean of 27 mmHg.   Pulmonary capillary wedge pressures 14/13 with a mean of 13 mmHg.   PA saturation was 73% and FA saturation was 95%.   Cardiac output was 6.0 with a cardiac index of 3.1 by Fick method.   HEMODYNAMIC DATA:  Left heart catheterization hemodynamic data:  The left  ventricular pressures were 151/3 with  end diastolic pressure of 9 mmHg. The  aortic pressure was 157/92 with a mean of 119 mmHg. There was no pressure  gradient across the aortic valve.   ANGIOGRAPHIC DATA:  Left ventricle:  The left ventricle is mildly dilated.  Left ventricular systolic function is markedly depressed with ejection  fraction of around 30 to 35% with marked global hypokinesis. No significant  mitral regurgitation.   CORONARY ARTERIES:  Right coronary artery:  The right coronary artery is a  large dominant vessel which is normal.   Left main:  The left main is normal.   Circumflex:  The circumflex is a large caliber vessel and is normal.   Left anterior descending:  The LAD is a large caliber vessel. It is smooth  and normal.   ABDOMINAL AORTOGRAM:  The abdominal aortogram revealed the presence of 2  renal arteries, 1 on either side. The left renal artery was not well  visualized at the initial injection, but the second injection revealed  widely patent renal arteries. Both right and left renal arteries  are widely  patent.   IMPRESSION:  1. Findings consistent with nonischemic, mildly dilated cardiomyopathy.      This is probably related to hypertension with hypertensive heart      disease.  2. Severe left ventricular systolic dysfunction, ejection fraction around      35% with moderate to marked global hypokinesis.  3. Moderate pulmonary hypertension.  4. Preserved cardiac output and cardiac index.   RECOMMENDATIONS:  1. Based on the data, she may benefit from secondary prevention of sudden      cardiac death. EP consultation has been obtained, and Lewayne Bunting has      been appraised of the situation. Further recommendations will follow      from the electrophysiologic input.  2. Continued therapy for hypertension and for nonischemic cardiomyopathy      as indicated.   TECHNIQUES OF THE PROCEDURE:  Under sterile precautions using a 6-French R  femoral arterial access and a 7-French R femoral  venous access, left and  right heart catheterization was performed .   Initially, right heart catheterization was performed by advancing a balloon  tip Swan-Ganz catheter into the pulmonary artery, and pulmonary capillary  wedge was easily obtained. Right heart hemodynamics were carefully measured.  Then, the catheter was pulled out of the body in the usual fashion.   Left heart catheterization:  Using a 6-French multipurpose B2 the catheter  was advanced in the ascending aorta with over a 0.035 j-wire. The catheter  was gently advanced, and the left ventricular pressures were monitored. Hand  contrast injection was performed of the left ventricle was performed, both  in the LAO and RAO projection. The catheter was flushed with saline and  pulled back into the ascending aorta, and pressure gradient across the  aortic valve was monitored.   Right coronary artery was selectively engaged angiography was performed. In  a similar fashion, the left main coronary artery was selectively engaged  angiography was performed. Then the catheter was pulled back in the  abdominal aorta, and abdominal aortogram was performed. Then, the catheter  was pulled out of the body in usual fashion. The patient to the procedure  well.  A total of 120 cc of contrast was utilized for diagnostic  angiography.      Cristy Hilts. Jacinto Halim, MD  Electronically Signed    JRG/MEDQ  D:  02/08/2006  T:  02/08/2006  Job:  [id number]   cc:   Merlene Laughter. Renae Gloss, M.D.  Doylene Canning. Ladona Ridgel, M.D.

## 2010-12-12 NOTE — Assessment & Plan Note (Signed)
[facility]                         ELECTROPHYSIOLOGY OFFICE NOTE   DANEL, STUDZINSKI                       MRN:          [id number]  DATE:07/13/2006                            DOB:          [birth year]    [name] returns today for followup.  She is a pleasant, 35 year old  woman with a history of severe hypertension and hypertensive  cardiomyopathy and an EF of 20% who is status post ICD insertion.  She  has had a history of syncope in the past as well.  She denies chest pain  or shortness of breath.  She does admit to some peripheral edema.  She  has had some compliance issues with her blood pressure medications.  I  suspect there was an issue of sodium indiscretion as well.   EXAM:  She was a pleasant, young woman in no distress.  Blood pressure was 170/100.  The pulse was 85 and regular.  Respirations  were 18 and the weight was 225 pounds.  NECK:  Revealed no jugular venous distention.  There was no thyromegaly.  Trachea was midline.  The carotids were 2+ and symmetric.  LUNGS:  Clear bilaterally to auscultation.  No wheezes, rales, or  rhonchi.  CARDIOVASCULAR:  Regular rate and rhythm with a normal S1, S2.  There is  a S4 gallops present.  EXTREMITIES:  Demonstrated no edema.   Interrogation of her defibrillator demonstrates a Medtronic Maximo with  R-waves of 17 and impedence of 456 ohms and a pacing threshold of 1.5 V  at 0.5 msec.  The battery voltage is 3.2 V.  There were no intercurrent  ICD therapies.   IMPRESSION:  1. Nonischemic cardiomyopathy.  2. Congestive heart failure, class II.  3. Severe hypertension.  4. History of syncope.   DISCUSSION:  Overall, [name] is stable.  Her defibrillator is  working normally.  She does not have one of the [employer] leads but  has a Oncologist lead which has not been a recall lead.  Today, I changed her from once a day Coreg to twice a day Coreg to help  with the  medical expenses.  We will see her back in the ER for ICD  followup.     Doylene Canning. Ladona Ridgel, MD     GWT/MedQ  DD: 07/13/2006  DT: 07/13/2006  Job #: [id number]   cc:   Cristy Hilts. Jacinto Halim, MD

## 2011-01-01 ENCOUNTER — Encounter: Payer: BC Managed Care – PPO | Admitting: *Deleted

## 2011-01-06 ENCOUNTER — Encounter: Payer: Self-pay | Admitting: *Deleted

## 2011-03-05 ENCOUNTER — Ambulatory Visit (INDEPENDENT_AMBULATORY_CARE_PROVIDER_SITE_OTHER): Payer: BC Managed Care – PPO | Admitting: Internal Medicine

## 2011-03-05 ENCOUNTER — Telehealth: Payer: Self-pay | Admitting: Internal Medicine

## 2011-03-05 ENCOUNTER — Encounter: Payer: Self-pay | Admitting: Internal Medicine

## 2011-03-05 ENCOUNTER — Encounter: Payer: Self-pay | Admitting: *Deleted

## 2011-03-05 VITALS — BP 182/104 | HR 83 | Ht 61.5 in | Wt 265.0 lb

## 2011-03-05 DIAGNOSIS — I10 Essential (primary) hypertension: Secondary | ICD-10-CM

## 2011-03-05 DIAGNOSIS — I509 Heart failure, unspecified: Secondary | ICD-10-CM

## 2011-03-05 DIAGNOSIS — I5022 Chronic systolic (congestive) heart failure: Secondary | ICD-10-CM

## 2011-03-05 MED ORDER — CARVEDILOL 25 MG PO TABS: 25.0000 mg | ORAL_TABLET | Freq: Every day | ORAL | Status: DC

## 2011-03-05 MED ORDER — POTASSIUM CHLORIDE CRYS ER 20 MEQ PO TBCR: 20.0000 meq | EXTENDED_RELEASE_TABLET | Freq: Every day | ORAL | Status: DC

## 2011-03-05 MED ORDER — FUROSEMIDE 20 MG PO TABS: ORAL_TABLET | ORAL | Status: DC

## 2011-03-05 NOTE — Telephone Encounter (Signed)
Per pt call, pt has gained 14 or 15lbs recently 10lbs in the last two days. Pt is also having trouble breathing and would like to speak to nurse.   Pt talked to on site nurse at work who gave pt a breathing treatment, b/c they thought it was pt asthma and not sure if it was fluid retention from pt CHF. Pt wanted to get an appt today, yet I told her there were no openings today. Please return pt call to advise/discuss.

## 2011-03-05 NOTE — Patient Instructions (Signed)
Your physician recommends that you schedule a follow-up appointment in: 3 weeks with dr taylor  Your physician recommends that you return for lab work in: Monday 03-09-11 AND NURSE ROOM VISIT FOR WEIGHT  INCREASE FUROSEMIDE 40MG  TAKE ONE AND ONE HALF TABLET TWICE DAILY X 4 DAYS THEN ONE TABLET TWICE DAILY  START KLOR-CON 20 MEQ ONE TABLET ONCE DAILY

## 2011-03-05 NOTE — Progress Notes (Signed)
HPI [name] returns today for followup. She is a pleasant, morbidly obese woman with a nonischemic cardiomyopathy and chronic systolic heart failure.  The patient has gained approximately 15 pounds in the last 3-4 weeks. She had been working hard to lose weight until about a month ago. She notes that over the last 2 weeks she has had increasing dyspnea on exertion, peripheral edema, and orthopnea. She denies chest pain. She has not had syncope in her ICD has not gone off. She denies indiscretion with sodium. She has taken her medications. Allergies  Allergen Reactions  . Oxycodone-Acetaminophen      Current Outpatient Prescriptions  Medication Sig Dispense Refill  . aliskiren (TEKTURNA) 300 MG tablet Take 300 mg by mouth daily.        Marland Kitchen amLODipine-benazepril (LOTREL) 10-20 MG per capsule Take 1 capsule by mouth daily.        . carvedilol (COREG) 25 MG tablet Take 1 tablet (25 mg total) by mouth daily.  30 tablet  12  . cetirizine (ZYRTEC) 10 MG tablet Take 10 mg by mouth daily.        . cholecalciferol (VITAMIN D) 1000 UNITS tablet Take 1,000 Units by mouth daily.        . digoxin (LANOXIN) 0.25 MG tablet Take 250 mcg by mouth daily.        . furosemide (LASIX) 20 MG tablet One and one half tablets twice daily x 4 days then one tablet twice daily  60 tablet  12  . multivitamin (THERAGRAN) per tablet Take 1 tablet by mouth daily.        Marland Kitchen olmesartan-hydrochlorothiazide (BENICAR HCT) 40-25 MG per tablet Take 1 tablet by mouth daily.        Marland Kitchen omega-3 fish oil (MAXEPA) 1000 MG CAPS capsule Take by mouth daily.        . ranitidine (ZANTAC) 150 MG capsule Take 150 mg by mouth daily.        . potassium chloride SA (KLOR-CON M20) 20 MEQ tablet Take 1 tablet (20 mEq total) by mouth daily.  30 tablet  12     No past medical history on file.  ROS:   All systems reviewed and negative except as noted in the HPI.   No past surgical history on file.   No family history on file.   History     Social History  . Marital Status: Legally Separated    Spouse Name: N/A    Number of Children: N/A  . Years of Education: N/A   Occupational History  . Not on file.   Social History Main Topics  . Smoking status: Never Smoker   . Smokeless tobacco: Never Used  . Alcohol Use: No  . Drug Use: No  . Sexually Active: Not on file   Other Topics Concern  . Not on file   Social History Narrative  . No narrative on file     BP 182/104  Pulse 83  Ht 5' 1.5" (1.562 m)  Wt 265 lb (120.203 kg)  BMI 49.26 kg/m2  Physical Exam:  anxious appearing morbidly obese woman, NAD HEENT: Unremarkable Neck:  7 cm JVD, no thyromegally Lymphatics:  No adenopathy Back:  No CVA tenderness Lungs:  Rales bilaterally one third up. No wheezes or rhonchi. HEART:  Regular rate rhythm, no murmurs, no rubs, no clicks Abd:  Morbidly obese, soft, positive bowel sounds, no organomegally, no rebound, no guarding Ext:  2 plus pulses, 3+ edema bilaterally, no cyanosis,  no clubbing Skin:  No rashes no nodules Neuro:  CN II through XII intact, motor grossly intact  EKG Normal sinus rhythm with left ventricular hypertrophy  Assess/Plan:

## 2011-03-05 NOTE — Assessment & Plan Note (Signed)
Her blood pressure is elevated today. She denies noncompliance. After we increased her to maintain a low-sodium diet. When she is euvolemic I suspect her pressure will improve.

## 2011-03-05 NOTE — Assessment & Plan Note (Signed)
Her chronic systolic heart failure is worsened. She is edematous. She is not acutely ill appearing however. She appears to again 10-15 pounds. Today I have asked that the patient increase her Lasix to 120 mg daily taken in divided doses. She will do this for 4 days. Have given her a potassium supplement to take as well. I will have her come back in several days to check electrolytes. She is instructed to maintain a low-sodium diet.

## 2011-03-05 NOTE — Telephone Encounter (Signed)
Pt calls today with a weight gain of 6 pounds overnight. Today weight is 266.  She reports her weight has steadily increased over the last few days. She is now sleeping on 3 pillows ( 2 were the norm)  And is still not resting well. She is short of breath with exertion and "sweating profusely".  Pt states " by the time I walk 30 feet from my desk to the phone I feel like I have ran 1/2 a mile".  Pt does have increased edema of the feet and legs making it difficult to wear shoes.  She was given a breathing treatment yesterday and today b/c thought it might be her asthma. This has not helped.  She is taking all medications as prescribed. Mylo Red RN

## 2011-03-05 NOTE — Telephone Encounter (Signed)
Pt will come in today for 4 pm appt with Dr. Ernst Breach RN

## 2011-03-06 ENCOUNTER — Telehealth: Payer: Self-pay | Admitting: Internal Medicine

## 2011-03-06 DIAGNOSIS — I5022 Chronic systolic (congestive) heart failure: Secondary | ICD-10-CM

## 2011-03-06 MED ORDER — FUROSEMIDE 40 MG PO TABS: ORAL_TABLET | ORAL | Status: DC

## 2011-03-06 NOTE — Telephone Encounter (Signed)
Pt was given furosemide and she thought the dose was increased and when she went to Pharm the dose had not changed so she was wondering what needs to happen. She uses [facility] on [address]

## 2011-03-06 NOTE — Telephone Encounter (Signed)
Refilled Patient's Lasix for 40 mg twice daily.

## 2011-03-09 ENCOUNTER — Ambulatory Visit (INDEPENDENT_AMBULATORY_CARE_PROVIDER_SITE_OTHER): Payer: BC Managed Care – PPO

## 2011-03-09 ENCOUNTER — Encounter: Payer: Self-pay | Admitting: *Deleted

## 2011-03-09 DIAGNOSIS — I10 Essential (primary) hypertension: Secondary | ICD-10-CM

## 2011-03-09 DIAGNOSIS — I5022 Chronic systolic (congestive) heart failure: Secondary | ICD-10-CM

## 2011-03-09 LAB — BASIC METABOLIC PANEL
Calcium: 9.2 mg/dL (ref 8.4–10.5)
Chloride: 94 mEq/L — ABNORMAL LOW (ref 96–112)
Creatinine, Ser: 1.1 mg/dL (ref 0.4–1.2)
Sodium: 139 mEq/L (ref 135–145)

## 2011-03-09 NOTE — Progress Notes (Signed)
Pt here for blood pressure check after Lasix dosage increased. Pt does report feeling better and edema is down. Weight is down to 254 (loss of 11 pounds). Pt states she has experienced some cramping in her back when she turns a certain way. Pt will also have a BMP drawn today.

## 2011-03-09 NOTE — Progress Notes (Signed)
Addended by: Keitha Butte on: 03/09/2011 01:43 PM   Modules accepted: Orders

## 2011-03-09 NOTE — Patient Instructions (Signed)
Please continue with medication plan as instructed by Dr Ladona Ridgel. Please call our office if edema or weight gain increases.

## 2011-03-16 ENCOUNTER — Encounter: Payer: Self-pay | Admitting: Internal Medicine

## 2011-03-18 ENCOUNTER — Encounter: Payer: Self-pay | Admitting: Internal Medicine

## 2011-03-18 ENCOUNTER — Ambulatory Visit (INDEPENDENT_AMBULATORY_CARE_PROVIDER_SITE_OTHER): Payer: BC Managed Care – PPO | Admitting: Internal Medicine

## 2011-03-18 VITALS — BP 146/88 | HR 75 | Ht 61.5 in | Wt 259.0 lb

## 2011-03-18 DIAGNOSIS — I472 Ventricular tachycardia: Secondary | ICD-10-CM

## 2011-03-18 DIAGNOSIS — I428 Other cardiomyopathies: Secondary | ICD-10-CM

## 2011-03-18 DIAGNOSIS — I10 Essential (primary) hypertension: Secondary | ICD-10-CM

## 2011-03-18 DIAGNOSIS — I509 Heart failure, unspecified: Secondary | ICD-10-CM

## 2011-03-18 NOTE — Assessment & Plan Note (Signed)
Her blood pressure is not optimally controlled. Additional medication up titration will be recommended over the course of the next few months. I have again encouraged her to maintain a low-sodium diet.

## 2011-03-18 NOTE — Assessment & Plan Note (Signed)
She has had no recurrent ventricular arrhythmias since her last visit. She'll continue her current medical therapy.

## 2011-03-18 NOTE — Progress Notes (Signed)
HPI [name] returns today for followup. She is a very pleasant 35 year old morbidly obese woman with nonischemic cardiomyopathy, hypertension, and chronic systolic heart failure. When I saw her last approximately 2 weeks ago, she was volume overloaded and dyspneic. We up titrated her Lasix to 80 mg twice a day for 4 days and her weight decreased over 10 pounds. Her symptoms were much improved. After that she went back to 40 mg twice a day and over the last few days has increased her weight approximately 3 pounds. She feels slightly more dyspneic than she was at her best but still better than she was last saw her back 2 weeks ago. She has had no ICD shocks. She denies dietary noncompliance. Allergies  Allergen Reactions  . Oxycodone-Acetaminophen      Current Outpatient Prescriptions  Medication Sig Dispense Refill  . aliskiren (TEKTURNA) 300 MG tablet Take 300 mg by mouth daily.        Marland Kitchen amLODipine-benazepril (LOTREL) 10-20 MG per capsule Take 1 capsule by mouth daily.        . carvedilol (COREG) 25 MG tablet Take 1 tablet (25 mg total) by mouth daily.  30 tablet  12  . cetirizine (ZYRTEC) 10 MG tablet Take 10 mg by mouth daily.        . Cholecalciferol (VITAMIN D) 2000 UNITS tablet Take 2,000 Units by mouth daily.        . digoxin (LANOXIN) 0.25 MG tablet Take 250 mcg by mouth daily.        . furosemide (LASIX) 40 MG tablet Take one tablet twice daily.  60 tablet  6  . Magnesium 250 MG TABS Take 1 tablet by mouth daily.        . multivitamin (THERAGRAN) per tablet Take 1 tablet by mouth daily.        Marland Kitchen olmesartan-hydrochlorothiazide (BENICAR HCT) 40-25 MG per tablet Take 1 tablet by mouth daily.        Marland Kitchen omega-3 fish oil (MAXEPA) 1000 MG CAPS capsule Take by mouth daily.        . potassium chloride SA (KLOR-CON M20) 20 MEQ tablet Take 1 tablet (20 mEq total) by mouth daily.  30 tablet  12  . ranitidine (ZANTAC) 150 MG capsule Take 150 mg by mouth daily.        . vitamin C (ASCORBIC  ACID) 500 MG tablet Take 500 mg by mouth daily.           Past Medical History  Diagnosis Date  . Other primary cardiomyopathies   . Atrial fibrillation   . Paroxysmal ventricular tachycardia   . CHF (congestive heart failure)   . Hypertension   . Acute bronchitis and bronchiolitis     ROS:   All systems reviewed and negative except as noted in the HPI.   Past Surgical History  Procedure Date  . Insert / replace / remove pacemaker  02/10/2006    Status post implantable cardioverter-defibrillator insertion  .Marland KitchenMarland KitchenMarland KitchenThe Medtronic Maximo VR, model 7232, single chamber  . Cardiac catheterization     EF 30-35%     Family History  Problem Relation Age of Onset  . Hypertension    . Heart disease       History   Social History  . Marital Status: Legally Separated    Spouse Name: N/A    Number of Children: N/A  . Years of Education: N/A   Occupational History  . [employer]   Social History Main  Topics  . Smoking status: Never Smoker   . Smokeless tobacco: Never Used  . Alcohol Use: No  . Drug Use: No  . Sexually Active: Not on file   Other Topics Concern  . Not on file   Social History Narrative  . No narrative on file     BP 146/88  Pulse 75  Ht 5' 1.5" (1.562 m)  Wt 259 lb (117.482 kg)  BMI 48.15 kg/m2  Physical Exam:  Morbidly obese appearing NAD HEENT: Unremarkable Neck:  No JVD, no thyromegally Lymphatics:  No adenopathy Back:  No CVA tenderness Lungs:  Clear except for rales in the bases bilaterally. Well healed ICD incision. HEART:  Regular rate rhythm, no murmurs, no rubs, no clicks. R. Sounds are somewhat distant. Abd:  soft, positive bowel sounds, no organomegally, no rebound, no guarding Ext:  2 plus pulses, 1+ edema, no cyanosis, no clubbing Skin:  No rashes no nodules Neuro:  CN II through XII intact, motor grossly intact  DEVICE  Normal device function.  See PaceArt for details.   Assess/Plan:

## 2011-03-18 NOTE — Assessment & Plan Note (Signed)
Her chronic systolic heart failure is improved but still in need of additional medication titration. I recommended she increase her Lasix to 60 mg twice a day. Additional adjustments will be made as needed. My plan will be to refer her for advanced heart failure clinic for additional evaluation. Her young age makes her a consideration for heart transplant evaluation.

## 2011-03-18 NOTE — Patient Instructions (Signed)
Remote monitoring is used to monitor your Pacemaker of ICD from home. This monitoring reduces the number of office visits required to check your device to one time per year. It allows [place] to keep an eye on the functioning of your device to ensure it is working properly. You are scheduled for a device check from home on 06/19/11. You may send your transmission at any time that day. If you have a wireless device, the transmission will be sent automatically. After your physician reviews your transmission, you will receive a postcard with your next transmission date.  Your physician wants you to follow-up in: 1 year with Dr. Ladona Ridgel. You will receive a reminder letter in the mail two months in advance. If you don't receive a letter, please call our office to schedule the follow-up appointment.  You are being referred to Dr. Gala Romney for Heart Failure. We will contact you about this appointment.

## 2011-04-13 ENCOUNTER — Ambulatory Visit (HOSPITAL_COMMUNITY)
Admission: RE | Admit: 2011-04-13 | Discharge: 2011-04-13 | Disposition: A | Discharge status: Home / Self Care | Payer: BC Managed Care – PPO | Source: Ambulatory Visit | Attending: Internal Medicine | Admitting: Internal Medicine

## 2011-04-13 VITALS — BP 138/88 | HR 90 | Resp 18 | Wt 255.4 lb

## 2011-04-13 DIAGNOSIS — I5022 Chronic systolic (congestive) heart failure: Secondary | ICD-10-CM

## 2011-04-13 LAB — BASIC METABOLIC PANEL
CO2: 37 mEq/L — ABNORMAL HIGH (ref 19–32)
Calcium: 10 mg/dL (ref 8.4–10.5)
Chloride: 99 mEq/L (ref 96–112)
GFR calc Af Amer: >60 mL/min (ref >60)
Glucose, Bld: 110 mg/dL — ABNORMAL HIGH (ref 70–99)
Potassium: 3.9 mEq/L (ref 3.5–5.1)
Sodium: 143 mEq/L (ref 135–145)

## 2011-04-13 MED ORDER — SPIRONOLACTONE 25 MG PO TABS: 12.5000 mg | ORAL_TABLET | Freq: Every day | ORAL | Status: DC

## 2011-04-13 NOTE — Assessment & Plan Note (Addendum)
NYHA III. Volume status elevated. Dyspnea on exertion. Will start spironolactone 12.5mg  daily. Stop potassium. She is instructed to wear ted hose when out of bed. Check BMET and BNP today. ECHO performed at bedside by Dr Gala Romney EF 50-55%.  Formal ECHO ordered. Encouraged to keep exercising and weigh daily.  Follow up in 2 weeks.

## 2011-04-13 NOTE — Progress Notes (Signed)
HPI:  35 year old morbidly obese woman with nonischemic cardiomyopathy, hypertension,  chronic systolic heart failure and obesity.   Last ECHO 2007 25% EF   Cardiac Cath 2007 -Findings consistent with nonischemic, mildly dilated cardiomyopathy. This is probably related to hypertension with hypertensive heart disease. Severe left ventricular systolic dysfunction, ejection fraction around 35% with moderate to marked global hypokinesis. Moderate pulmonary hypertension. Preserved cardiac output and cardiac index.   She is referred by Dr Ladona Ridgel due to her young age and possible need for advanced therapies.  She has been titrating Lasix up to 80 mg  BID for the last 2 weeks but she still has lower extremity edema. She does not have compression stockings.  Weight at home 247-253.   She sleeps on 3 pillows at night.  She has CPAP but she does not use it . She joined [employer] 03/30/2011. .Now following a low salt diet.  She is walking but she is tired.  SOB walking up steps. SOB on exertion.  Abdominal bloating.   No shocks.     ROS: All systems negative except as listed in HPI, PMH and Problem List.  Past Medical History  Diagnosis Date  . Other primary cardiomyopathies   . Atrial fibrillation   . Paroxysmal ventricular tachycardia   . CHF (congestive heart failure)   . Hypertension   . Acute bronchitis and bronchiolitis     Current Outpatient Prescriptions  Medication Sig Dispense Refill  . aliskiren (TEKTURNA) 300 MG tablet Take 300 mg by mouth daily.        Marland Kitchen amLODipine-benazepril (LOTREL) 10-20 MG per capsule Take 1 capsule by mouth daily.        . carvedilol (COREG) 25 MG tablet Take 1 tablet (25 mg total) by mouth daily.  30 tablet  12  . cetirizine (ZYRTEC) 10 MG tablet Take 10 mg by mouth daily.        . Cholecalciferol (VITAMIN D) 2000 UNITS tablet Take 2,000 Units by mouth daily.        . digoxin (LANOXIN) 0.25 MG tablet Take 250 mcg by mouth daily.        . furosemide  (LASIX) 40 MG tablet Take one tablet twice daily.  60 tablet  6  . Magnesium 250 MG TABS Take 1 tablet by mouth daily.        . multivitamin (THERAGRAN) per tablet Take 1 tablet by mouth daily.        Marland Kitchen olmesartan-hydrochlorothiazide (BENICAR HCT) 40-25 MG per tablet Take 1 tablet by mouth daily.        Marland Kitchen omega-3 fish oil (MAXEPA) 1000 MG CAPS capsule Take by mouth daily.        . potassium chloride SA (KLOR-CON M20) 20 MEQ tablet Take 1 tablet (20 mEq total) by mouth daily.  30 tablet  12  . ranitidine (ZANTAC) 150 MG capsule Take 150 mg by mouth daily.        . vitamin C (ASCORBIC ACID) 500 MG tablet Take 500 mg by mouth daily.           PHYSICAL EXAM: Filed Vitals:   04/13/11 1446  BP: 138/88  Pulse: 90  Resp: 18   General:  Well appearing. No resp difficulty HEENT: normal Neck: supple. JVP 6 Carotids 2+ bilaterally; no bruits. No lymphadenopathy or thryomegaly appreciated. Cor: PMI normal. Regular rate & rhythm. No rubs, gallops or murmurs. Lungs: clear Abdomen: soft, nontender, distended and obeses. No hepatosplenomegaly. No bruits or masses. Good bowel sounds.  Extremities: no cyanosis, clubbing, rash, 2+ lower extremity  edema Neuro: alert & orientedx3, cranial nerves grossly intact. Moves all 4 extremities w/o difficulty. Affect pleasant.       ASSESSMENT & PLAN:  Patient seen and examined with Tonye Becket, NP. We discussed all aspects of the encounter. I agree with the assessment and plan as stated above.

## 2011-04-13 NOTE — Patient Instructions (Signed)
Stop Potassium Start Spironolactone 25 mg 1/2 tab daily  Lab today  Your physician has requested that you have an echocardiogram. Echocardiography is a painless test that uses sound waves to create images of your heart. It provides your doctor with information about the size and shape of your heart and how well your heart's chambers and valves are working. This procedure takes approximately one hour. There are no restrictions for this procedure.  Your physician recommends that you schedule a follow-up appointment in: 2 weeks

## 2011-04-24 ENCOUNTER — Telehealth: Payer: Self-pay | Admitting: Internal Medicine

## 2011-04-24 NOTE — Telephone Encounter (Signed)
Refill of furosimide 80 mg was increased to  two tabs twice a day, uses [facility] [address] pt has almost run out

## 2011-04-27 ENCOUNTER — Ambulatory Visit (HOSPITAL_COMMUNITY)
Admission: RE | Admit: 2011-04-27 | Discharge: 2011-04-27 | Disposition: A | Discharge status: Home / Self Care | Payer: BC Managed Care – PPO | Source: Ambulatory Visit | Attending: Internal Medicine | Admitting: Internal Medicine

## 2011-04-27 VITALS — BP 122/84 | HR 66 | Wt 253.2 lb

## 2011-04-27 DIAGNOSIS — I509 Heart failure, unspecified: Secondary | ICD-10-CM | POA: Insufficient documentation

## 2011-04-27 DIAGNOSIS — I5022 Chronic systolic (congestive) heart failure: Secondary | ICD-10-CM

## 2011-04-27 DIAGNOSIS — I517 Cardiomegaly: Secondary | ICD-10-CM

## 2011-04-27 DIAGNOSIS — I10 Essential (primary) hypertension: Secondary | ICD-10-CM | POA: Insufficient documentation

## 2011-04-27 DIAGNOSIS — M7989 Other specified soft tissue disorders: Secondary | ICD-10-CM | POA: Insufficient documentation

## 2011-04-27 DIAGNOSIS — E785 Hyperlipidemia, unspecified: Secondary | ICD-10-CM | POA: Insufficient documentation

## 2011-04-27 LAB — BASIC METABOLIC PANEL
Chloride: 97 mEq/L (ref 96–112)
Creatinine, Ser: 1.16 mg/dL — ABNORMAL HIGH (ref 0.50–1.10)
GFR calc Af Amer: 69 mL/min — ABNORMAL LOW (ref >90)
Potassium: 4.4 mEq/L (ref 3.5–5.1)

## 2011-04-27 MED ORDER — FUROSEMIDE 80 MG PO TABS: 80.0000 mg | ORAL_TABLET | Freq: Two times a day (BID) | ORAL | Status: DC

## 2011-04-27 NOTE — Progress Notes (Signed)
HPI:  35 year old morbidly obese woman with nonischemic cardiomyopathy, hypertension,  chronic systolic heart failure and obesity.   Last ECHO 2007 25% EF   Cardiac Cath 2007 -Findings consistent with nonischemic, mildly dilated cardiomyopathy. This is probably related to hypertension with hypertensive heart disease. Severe left ventricular systolic dysfunction, ejection fraction around 35% with moderate to marked global hypokinesis. Moderate pulmonary hypertension. Preserved cardiac output and cardiac index.  04/13/2011 BMET Potassium 3.9, BUN 20, Creatinine 0.99 BNP 192 ECHO 04/27/2011 45-50% She is here for follow up. Complaining of leg cramps.   She continues to take Lasix 80 mg BID.  She has a prescription for compression stockings but has not purchased.   Weight at home 244-249. Over the last few days she has been 244.   She sleeps on 32pillows at night. She has CPAP but she does not use it regularly. She joined [employer] and she is participating in [employer]. .Now following a low salt diet.  She is walking but has started to jog.  Walking up steps. Denies SOB.   No shocks.     ROS: All systems negative except as listed in HPI, PMH and Problem List.  Past Medical History  Diagnosis Date  . Other primary cardiomyopathies   . Atrial fibrillation   . Paroxysmal ventricular tachycardia   . CHF (congestive heart failure)   . Hypertension   . Acute bronchitis and bronchiolitis     Current Outpatient Prescriptions  Medication Sig Dispense Refill  . aliskiren (TEKTURNA) 300 MG tablet Take 300 mg by mouth daily.        Marland Kitchen amLODipine-benazepril (LOTREL) 10-20 MG per capsule Take 1 capsule by mouth daily.        . carvedilol (COREG) 25 MG tablet Take 1 tablet (25 mg total) by mouth daily.  30 tablet  12  . cetirizine (ZYRTEC) 10 MG tablet Take 10 mg by mouth daily.        . Cholecalciferol (VITAMIN D) 2000 UNITS tablet Take 2,000 Units by mouth daily.        . digoxin  (LANOXIN) 0.25 MG tablet Take 250 mcg by mouth daily.        . furosemide (LASIX) 40 MG tablet Take one tablet twice daily.  60 tablet  6  . Magnesium 250 MG TABS Take 1 tablet by mouth daily.        . multivitamin (THERAGRAN) per tablet Take 1 tablet by mouth daily.        Marland Kitchen olmesartan-hydrochlorothiazide (BENICAR HCT) 40-25 MG per tablet Take 1 tablet by mouth daily.        Marland Kitchen omega-3 fish oil (MAXEPA) 1000 MG CAPS capsule Take by mouth daily.        . ranitidine (ZANTAC) 150 MG capsule Take 150 mg by mouth daily.        Marland Kitchen spironolactone (ALDACTONE) 25 MG tablet Take 0.5 tablets (12.5 mg total) by mouth daily.  30 tablet  6  . vitamin C (ASCORBIC ACID) 500 MG tablet Take 500 mg by mouth daily.           PHYSICAL EXAM: Filed Vitals:   04/27/11 1522  BP: 122/84  Pulse: 66   General:  Well appearing. No resp difficulty HEENT: normal Neck: supple. JVP 6 Carotids 2+ bilaterally; no bruits. No lymphadenopathy or thryomegaly appreciated. Cor: PMI normal. Regular rate & rhythm. No rubs, gallops or murmurs. Lungs: clear Abdomen: soft, nontender, distended and obese. No hepatosplenomegaly. No bruits or masses. Good  bowel sounds. Extremities: no cyanosis, clubbing, rash,  1+lower extremity  edema Neuro: alert & orientedx3, cranial nerves grossly intact. Moves all 4 extremities w/o difficulty. Affect pleasant.       ASSESSMENT & PLAN:

## 2011-04-27 NOTE — Patient Instructions (Signed)
Stop taking tecturna.  Continue to weigh and record daily.  Please follow up in 2 weeks.

## 2011-04-27 NOTE — Assessment & Plan Note (Addendum)
NYHA III. Volume status elevated. Compliant with medications. ECHO today. 45-50% Grade 1 diastolic dysfunction.  She is currently on ARB/ ACE/Tekturna and spironolactone. Will d/c Tecturna now and plan to d/c  ACE-I or ARB down the road as ACE/ARB/spiro combination is contraindicated. Check BMET today and in two weeks.  Follow up in two weeks for medication management. Will need to watch BP.   Patient seen and examined with Tonye Becket, NP. We discussed all aspects of the encounter. I agree with the assessment and plan as stated above.

## 2011-04-30 LAB — URINALYSIS, ROUTINE W REFLEX MICROSCOPIC
Bilirubin Urine: NEGATIVE
Nitrite: NEGATIVE
Protein, ur: 30 mg/dL — AB
Specific Gravity, Urine: 1.013 (ref 1.005–1.030)
Urobilinogen, UA: 0.2 mg/dL (ref 0.0–1.0)

## 2011-04-30 LAB — POCT I-STAT, CHEM 8
BUN: 15 mg/dL (ref 6–23)
Chloride: 103 mEq/L (ref 96–112)
Creatinine, Ser: 1.1 mg/dL (ref 0.4–1.2)
Hemoglobin: 14.6 g/dL (ref 12.0–15.0)
Potassium: 3.6 mEq/L (ref 3.5–5.1)
Sodium: 142 mEq/L (ref 135–145)

## 2011-04-30 LAB — CBC
Hemoglobin: 13.1 g/dL (ref 12.0–15.0)
MCHC: 31.4 g/dL (ref 30.0–36.0)
MCV: 79.8 fL (ref 78.0–100.0)
RDW: 17.9 % — ABNORMAL HIGH (ref 11.5–15.5)

## 2011-04-30 LAB — DIFFERENTIAL
Basophils Relative: 1 % (ref 0–1)
Eosinophils Relative: 2 % (ref 0–5)
Lymphocytes Relative: 31 % (ref 12–46)

## 2011-04-30 LAB — URINE MICROSCOPIC-ADD ON

## 2011-04-30 LAB — PREGNANCY, URINE: Preg Test, Ur: NEGATIVE

## 2011-04-30 LAB — HEPATIC FUNCTION PANEL
AST: 14 U/L (ref 0–37)
Albumin: 3.5 g/dL (ref 3.5–5.2)

## 2011-05-11 ENCOUNTER — Ambulatory Visit (HOSPITAL_COMMUNITY)
Admission: RE | Admit: 2011-05-11 | Discharge: 2011-05-11 | Disposition: A | Discharge status: Home / Self Care | Payer: BC Managed Care – PPO | Source: Ambulatory Visit | Attending: Internal Medicine | Admitting: Internal Medicine

## 2011-05-11 VITALS — BP 130/74 | HR 66 | Wt 248.0 lb

## 2011-05-11 DIAGNOSIS — I10 Essential (primary) hypertension: Secondary | ICD-10-CM

## 2011-05-11 DIAGNOSIS — I5022 Chronic systolic (congestive) heart failure: Secondary | ICD-10-CM | POA: Insufficient documentation

## 2011-05-11 LAB — BASIC METABOLIC PANEL
BUN: 34 mg/dL — ABNORMAL HIGH (ref 6–23)
CO2: 32 mEq/L (ref 19–32)
Chloride: 99 mEq/L (ref 96–112)
GFR calc Af Amer: 59 mL/min — ABNORMAL LOW (ref >90)
Glucose, Bld: 92 mg/dL (ref 70–99)
Potassium: 4.1 mEq/L (ref 3.5–5.1)

## 2011-05-11 NOTE — Progress Notes (Signed)
HPI:  35 year old morbidly obese woman with nonischemic cardiomyopathy, hypertension,  chronic systolic heart failure and obesity.   Last ECHO 2007 25% EF   Cardiac Cath 2007 -Findings consistent with nonischemic, mildly dilated cardiomyopathy. This is probably related to hypertension with hypertensive heart disease. Severe left ventricular systolic dysfunction, ejection fraction around 35% with moderate to marked global hypokinesis. Moderate pulmonary hypertension. Preserved cardiac output and cardiac index.  04/13/2011 BMET Potassium 3.9, BUN 20, Creatinine 0.99 BNP 192 ECHO 04/27/2011 45-50%.  She returns for follow up today.  Her tekturna was d/c'd last visit, she was continued on lasix 80 mg BID.  She has been doing well.  She checks BP at work SBP 122-125.  She denies dyspnea, orthopnea or PND.  Weighing daily, it has been stable.  She is wearing compression stockings daily.  She walked a 5K yesterday without difficulty.  She continues to go to [employer].  Following a low sodium diet.      ROS: All systems negative except as listed in HPI, PMH and Problem List.  Past Medical History  Diagnosis Date  . Other primary cardiomyopathies   . Atrial fibrillation   . Paroxysmal ventricular tachycardia   . CHF (congestive heart failure)   . Hypertension   . Acute bronchitis and bronchiolitis     Current Outpatient Prescriptions  Medication Sig Dispense Refill  . amLODipine-benazepril (LOTREL) 10-20 MG per capsule Take 1 capsule by mouth daily.        . carvedilol (COREG) 25 MG tablet Take 1 tablet (25 mg total) by mouth daily.  30 tablet  12  . cetirizine (ZYRTEC) 10 MG tablet Take 10 mg by mouth daily.        . Cholecalciferol (VITAMIN D) 2000 UNITS tablet Take 2,000 Units by mouth daily.        . digoxin (LANOXIN) 0.25 MG tablet Take 250 mcg by mouth daily.        . furosemide (LASIX) 80 MG tablet Take 1 tablet (80 mg total) by mouth 2 (two) times daily.  60 tablet  6  .  Magnesium 250 MG TABS Take 1 tablet by mouth daily.        . multivitamin (THERAGRAN) per tablet Take 1 tablet by mouth daily.        Marland Kitchen olmesartan-hydrochlorothiazide (BENICAR HCT) 40-25 MG per tablet Take 1 tablet by mouth daily.        Marland Kitchen omega-3 fish oil (MAXEPA) 1000 MG CAPS capsule Take by mouth daily.        . ranitidine (ZANTAC) 150 MG capsule Take 150 mg by mouth daily.        . rosuvastatin (CRESTOR) 5 MG tablet Take 5 mg by mouth daily.        Marland Kitchen spironolactone (ALDACTONE) 25 MG tablet Take 0.5 tablets (12.5 mg total) by mouth daily.  30 tablet  6  . vitamin C (ASCORBIC ACID) 500 MG tablet Take 500 mg by mouth daily.           PHYSICAL EXAM: Filed Vitals:   05/11/11 1559  BP: 130/74  Pulse: 66   General:  Well appearing. No resp difficulty HEENT: normal Neck: supple. JVP 6 Carotids 2+ bilaterally; no bruits. No lymphadenopathy or thryomegaly appreciated. Cor: PMI normal. Regular rate & rhythm. No rubs, gallops or murmurs. Lungs: clear Abdomen: soft, nontender, distended and obese. No hepatosplenomegaly. No bruits or masses. Good bowel sounds. Extremities: no cyanosis, clubbing, rash,  Trace lower extremity  edema Neuro:  alert & orientedx3, cranial nerves grossly intact. Moves all 4 extremities w/o difficulty. Affect pleasant.       ASSESSMENT & PLAN:

## 2011-05-11 NOTE — Assessment & Plan Note (Signed)
Stable today.  Will dc lotrel and reassess in 2 weeks.  Will most likely have to titrate other regimens but would like to complete this in stages.

## 2011-05-11 NOTE — Assessment & Plan Note (Addendum)
Volume status stable today.  NYHA I. EF recovered on echo.  Pt on ACEI/ARB/Spiro which is contraindicated.  Will d/c Lotrel today (gets edema with amlodipine) and change coreg to 12.5 BID.  Will reassess in 2 weeks with plans to titrate spiro as needed. May also need an additional agent.   Check BMET/Mag today.    Patient seen and examined with Ulyess Blossom PA-C. We discussed all aspects of the encounter. I agree with the assessment and plan as stated above.

## 2011-05-11 NOTE — Patient Instructions (Signed)
Discontinue Lotrel.   Take Coreg 12.5 mg twice daily (1/2 tablet twice daily).   Check labs today.    Follow up in 2 weeks.

## 2011-05-13 NOTE — Progress Notes (Signed)
Encounter addended by: Dolores Patty, MD on: 05/13/2011  3:58 PM<BR>     Documentation filed: Laurie Panda

## 2011-05-24 NOTE — Progress Notes (Signed)
Patient seen and examined with Nicki Bradley PA-C. We discussed all aspects of the encounter. I agree with the assessment and plan as stated above.   

## 2011-05-25 ENCOUNTER — Ambulatory Visit (HOSPITAL_COMMUNITY)
Admission: RE | Admit: 2011-05-25 | Discharge: 2011-05-25 | Disposition: A | Discharge status: Home / Self Care | Payer: BC Managed Care – PPO | Source: Ambulatory Visit | Attending: Internal Medicine | Admitting: Internal Medicine

## 2011-05-25 ENCOUNTER — Encounter (HOSPITAL_COMMUNITY): Payer: Self-pay

## 2011-05-25 VITALS — BP 148/96 | HR 77 | Wt 245.0 lb

## 2011-05-25 DIAGNOSIS — Z79899 Other long term (current) drug therapy: Secondary | ICD-10-CM | POA: Insufficient documentation

## 2011-05-25 DIAGNOSIS — I428 Other cardiomyopathies: Secondary | ICD-10-CM | POA: Insufficient documentation

## 2011-05-25 DIAGNOSIS — I5022 Chronic systolic (congestive) heart failure: Secondary | ICD-10-CM | POA: Insufficient documentation

## 2011-05-25 DIAGNOSIS — I110 Hypertensive heart disease with heart failure: Secondary | ICD-10-CM | POA: Insufficient documentation

## 2011-05-25 DIAGNOSIS — I4891 Unspecified atrial fibrillation: Secondary | ICD-10-CM | POA: Insufficient documentation

## 2011-05-25 DIAGNOSIS — I509 Heart failure, unspecified: Secondary | ICD-10-CM | POA: Insufficient documentation

## 2011-05-25 NOTE — Assessment & Plan Note (Addendum)
NYHA II. Volume status stable. Tolerating medications. This appears to be her dry weight. Creatinine increased to 1.3. Will recheck BMET in one week. BMET to be obtained at her job in one week and faxed to HF clinic. She will follow up in one month.   Patient seen and examined with Tonye Becket, NP. We discussed all aspects of the encounter. I agree with the assessment and plan as stated above.

## 2011-05-25 NOTE — Progress Notes (Signed)
HPI:  35 year old morbidly obese woman with nonischemic cardiomyopathy, hypertension,  chronic systolic heart failure and obesity.   Last ECHO 2007 25% EF   Cardiac Cath 2007 -Findings consistent with nonischemic, mildly dilated cardiomyopathy. This is probably related to hypertension with hypertensive heart disease. Severe left ventricular systolic dysfunction, ejection fraction around 35% with moderate to marked global hypokinesis. Moderate pulmonary hypertension. Preserved cardiac output and cardiac index.  04/13/2011 BMET Potassium 3.9, BUN 20, Creatinine 0.99 BNP 192 ECHO 04/27/2011 45-50%.  10-15Creatinine 1.32 Potassium 4. 0  She returns for follow up today.  Last visit Lotrel discontinued. She was continued on lasix 80 mg BID.Wieght at home 236-241.   She has been doing well.  She checks BP at work SBP 120/76. Dyspnea, orthopnea or PND. Continues to sleep on two pillows which is decreased from two.   She is wearing compression stockings daily.  She continues to walk 1-2 miles per day.  She continues to go to [employer].  Following a low sodium diet.      ROS: All systems negative except as listed in HPI, PMH and Problem List.  Past Medical History  Diagnosis Date  . Other primary cardiomyopathies   . Atrial fibrillation   . Paroxysmal ventricular tachycardia   . CHF (congestive heart failure)   . Hypertension   . Acute bronchitis and bronchiolitis     Current Outpatient Prescriptions  Medication Sig Dispense Refill  . carvedilol (COREG) 25 MG tablet Take 1 tablet (25 mg total) by mouth daily.  30 tablet  12  . cetirizine (ZYRTEC) 10 MG tablet Take 10 mg by mouth daily.        . Cholecalciferol (VITAMIN D) 2000 UNITS tablet Take 2,000 Units by mouth daily.        . digoxin (LANOXIN) 0.25 MG tablet Take 250 mcg by mouth daily.        . furosemide (LASIX) 80 MG tablet Take 1 tablet (80 mg total) by mouth 2 (two) times daily.  60 tablet  6  . Magnesium 250 MG TABS Take  1 tablet by mouth daily.        . multivitamin (THERAGRAN) per tablet Take 1 tablet by mouth daily.        Marland Kitchen olmesartan-hydrochlorothiazide (BENICAR HCT) 40-25 MG per tablet Take 1 tablet by mouth daily.        Marland Kitchen omega-3 fish oil (MAXEPA) 1000 MG CAPS capsule Take by mouth daily.        . ranitidine (ZANTAC) 150 MG capsule Take 150 mg by mouth daily.        . rosuvastatin (CRESTOR) 5 MG tablet Take 5 mg by mouth daily.        Marland Kitchen spironolactone (ALDACTONE) 25 MG tablet Take 0.5 tablets (12.5 mg total) by mouth daily.  30 tablet  6  . vitamin C (ASCORBIC ACID) 500 MG tablet Take 500 mg by mouth daily.           PHYSICAL EXAM: Filed Vitals:   05/25/11 1447  BP: 148/96  Pulse: 77  Weight 245 (243) General:  Well appearing. No resp difficulty HEENT: normal Neck: supple. JVP 6 Carotids 2+ bilaterally; no bruits. No lymphadenopathy or thryomegaly appreciated. Cor: PMI normal. Regular rate & rhythm. No rubs, gallops or murmurs. Lungs: clear Abdomen: soft, nontender, distended and obese. No hepatosplenomegaly. No bruits or masses. Good bowel sounds. Extremities: no cyanosis, clubbing, rash,  Trace lower extremity  edema Neuro: alert & orientedx3, cranial nerves grossly  intact. Moves all 4 extremities w/o difficulty. Affect pleasant.       ASSESSMENT & PLAN:

## 2011-05-25 NOTE — Progress Notes (Signed)
Patient seen and examined with Amy Clegg, NP. We discussed all aspects of the encounter. I agree with the assessment and plan as stated above.   

## 2011-05-25 NOTE — Patient Instructions (Addendum)
Please continue to weigh and record weights daily  Please obtain BMET at your job in one week and fax results to [facility] [phone]  Follow up in one month.

## 2011-06-03 ENCOUNTER — Encounter: Payer: Self-pay | Admitting: Internal Medicine

## 2011-06-08 ENCOUNTER — Telehealth (HOSPITAL_COMMUNITY): Payer: Self-pay | Admitting: *Deleted

## 2011-06-08 MED ORDER — POTASSIUM CHLORIDE CRYS ER 20 MEQ PO TBCR: 20.0000 meq | EXTENDED_RELEASE_TABLET | Freq: Every day | ORAL | Status: DC

## 2011-06-08 NOTE — Telephone Encounter (Signed)
Pt aware of lab results, k 3.4 she will start kcl 20 meq daily per Tonye Becket, NP pt does have potassium pills at home, will recheck at next [facility] 11/30

## 2011-06-08 NOTE — Telephone Encounter (Signed)
Ms. [name] is returning a call regarding her labs.  You can call her back on her home number.  Thanks!

## 2011-06-19 ENCOUNTER — Encounter: Payer: BC Managed Care – PPO | Admitting: *Deleted

## 2011-06-20 ENCOUNTER — Encounter: Payer: Self-pay | Admitting: *Deleted

## 2011-06-26 ENCOUNTER — Ambulatory Visit (HOSPITAL_COMMUNITY)
Admission: RE | Admit: 2011-06-26 | Discharge: 2011-06-26 | Disposition: A | Discharge status: Home / Self Care | Payer: BC Managed Care – PPO | Source: Ambulatory Visit | Attending: Internal Medicine | Admitting: Internal Medicine

## 2011-06-26 VITALS — BP 138/80 | HR 68 | Wt 244.5 lb

## 2011-06-26 DIAGNOSIS — I5022 Chronic systolic (congestive) heart failure: Secondary | ICD-10-CM | POA: Insufficient documentation

## 2011-06-26 LAB — BASIC METABOLIC PANEL
BUN: 23 mg/dL (ref 6–23)
Chloride: 98 mEq/L (ref 96–112)
GFR calc Af Amer: 68 mL/min — ABNORMAL LOW (ref >90)
Potassium: 4.4 mEq/L (ref 3.5–5.1)

## 2011-06-26 MED ORDER — CARVEDILOL 3.125 MG PO TABS: 3.1250 mg | ORAL_TABLET | Freq: Every day | ORAL | Status: DC

## 2011-06-26 MED ORDER — SPIRONOLACTONE 25 MG PO TABS: 25.0000 mg | ORAL_TABLET | Freq: Every day | ORAL | Status: DC

## 2011-06-26 NOTE — Progress Notes (Signed)
Patient seen and examined with Nicki Bradley PA-C. We discussed all aspects of the encounter. I agree with the assessment and plan as stated below.   

## 2011-06-26 NOTE — Patient Instructions (Signed)
Stop potassium. Increase spironolactone to 25 mg (1 tab) daily.  Will try to add carvedilol 3.125 mg in the morning and continue 25 mg at night.  Labs today and in 2 weeks.    Follow up in 2 months with and ultrasound. Your physician has requested that you have an echocardiogram. Echocardiography is a painless test that uses sound waves to create images of your heart. It provides your doctor with information about the size and shape of your heart and how well your heart's chambers and valves are working. This procedure takes approximately one hour. There are no restrictions for this procedure.

## 2011-06-26 NOTE — Progress Notes (Signed)
HPI:  35 year old morbidly obese woman with nonischemic cardiomyopathy, hypertension,  chronic systolic heart failure and obesity.   Last ECHO 2007 25% EF   Cardiac Cath 2007 -Findings consistent with nonischemic, mildly dilated cardiomyopathy. This is probably related to hypertension with hypertensive heart disease. Severe left ventricular systolic dysfunction, ejection fraction around 35% with moderate to marked global hypokinesis. Moderate pulmonary hypertension. Preserved cardiac output and cardiac index.  04/13/2011 BMET Potassium 3.9, BUN 20, Creatinine 0.99 BNP 192 ECHO 04/27/2011 45-50%.  10-15Creatinine 1.32 Potassium 4. 0 11/7 Cr 1.35 K 3.4  ACEI/ARB/Spiro which is contraindicated, lotrel stopped.    She returns for follow up today.  Laid off 11/9 but feels ok about it.  Started going to the gym 4x week and working with a trainer twice a week.  Walking 25 min on a treadmill 4x week.  Tried taking carvedilol 12.5 mg BID but switched back to 25 mg nightly because it makes her tired/fall asleep during the day. Following low sodium diet.  A little frustrated weight is not going down.  No orthopnea/PND/CP.     ROS: All systems negative except as listed in HPI, PMH and Problem List.  Past Medical History  Diagnosis Date  . Other primary cardiomyopathies   . Atrial fibrillation   . Paroxysmal ventricular tachycardia   . CHF (congestive heart failure)   . Hypertension   . Acute bronchitis and bronchiolitis     Current Outpatient Prescriptions  Medication Sig Dispense Refill  . carvedilol (COREG) 25 MG tablet Take 25 mg by mouth at bedtime.        . cetirizine (ZYRTEC) 10 MG tablet Take 10 mg by mouth daily.        . Cholecalciferol (VITAMIN D) 2000 UNITS tablet Take 2,000 Units by mouth daily.        . digoxin (LANOXIN) 0.25 MG tablet Take 250 mcg by mouth daily.        . furosemide (LASIX) 80 MG tablet Take 1 tablet (80 mg total) by mouth 2 (two) times daily.  60 tablet  6    . Magnesium 250 MG TABS Take 1 tablet by mouth daily.        . multivitamin (THERAGRAN) per tablet Take 1 tablet by mouth daily.        Marland Kitchen olmesartan-hydrochlorothiazide (BENICAR HCT) 40-25 MG per tablet Take 1 tablet by mouth daily.        Marland Kitchen omega-3 fish oil (MAXEPA) 1000 MG CAPS capsule Take by mouth daily.        . potassium chloride SA (K-DUR,KLOR-CON) 20 MEQ tablet Take 1 tablet (20 mEq total) by mouth daily.      . ranitidine (ZANTAC) 150 MG capsule Take 150 mg by mouth daily.        . rosuvastatin (CRESTOR) 5 MG tablet Take 5 mg by mouth daily.        Marland Kitchen spironolactone (ALDACTONE) 25 MG tablet Take 0.5 tablets (12.5 mg total) by mouth daily.  30 tablet  6  . vitamin C (ASCORBIC ACID) 500 MG tablet Take 500 mg by mouth daily.           PHYSICAL EXAM: Filed Vitals:   06/26/11 0954  BP: 138/80  Pulse: 68  Weight 244  General:  Well appearing. No resp difficulty HEENT: normal Neck: supple. JVP 5-6 Carotids 2+ bilaterally; no bruits. No lymphadenopathy or thryomegaly appreciated. Cor: PMI normal. Regular rate & rhythm. No rubs, gallops or murmurs. Lungs: clear Abdomen: soft,  nontender, distended and obese. No hepatosplenomegaly. No bruits or masses. Good bowel sounds. Extremities: no cyanosis, clubbing, rash,  Trace lower extremity  edema Neuro: alert & orientedx3, cranial nerves grossly intact. Moves all 4 extremities w/o difficulty. Affect pleasant.       ASSESSMENT & PLAN:

## 2011-06-26 NOTE — Assessment & Plan Note (Addendum)
Volume status looks good today.  NYHA 1.  Continue lasix, increase Spironolactone 25 mg daily and stop potassium.  Will try carvedilol 3.125 in am and 25 mg in pm.  Will check labs today and in 2 weeks.  Repeat echo at the first of the year.  Encouraged her to continue exercising.

## 2011-07-10 ENCOUNTER — Other Ambulatory Visit: Payer: BC Managed Care – PPO | Admitting: *Deleted

## 2011-07-28 DIAGNOSIS — O24419 Gestational diabetes mellitus in pregnancy, unspecified control: Secondary | ICD-10-CM

## 2011-07-28 HISTORY — DX: Gestational diabetes mellitus in pregnancy, unspecified control: O24.419

## 2011-07-29 ENCOUNTER — Other Ambulatory Visit: Payer: Self-pay | Admitting: Internal Medicine

## 2011-07-29 ENCOUNTER — Ambulatory Visit (INDEPENDENT_AMBULATORY_CARE_PROVIDER_SITE_OTHER): Payer: BC Managed Care – PPO | Admitting: *Deleted

## 2011-07-29 ENCOUNTER — Encounter: Payer: Self-pay | Admitting: Internal Medicine

## 2011-07-29 DIAGNOSIS — I428 Other cardiomyopathies: Secondary | ICD-10-CM

## 2011-07-29 DIAGNOSIS — I509 Heart failure, unspecified: Secondary | ICD-10-CM

## 2011-07-29 DIAGNOSIS — I4891 Unspecified atrial fibrillation: Secondary | ICD-10-CM

## 2011-07-29 DIAGNOSIS — I472 Ventricular tachycardia: Secondary | ICD-10-CM

## 2011-07-29 LAB — REMOTE ICD DEVICE
BRDY-0002RV: 40 {beats}/min
CHARGE TIME: 8.64 s
DEV-0020ICD: NEGATIVE
RV LEAD AMPLITUDE: 16.6 mv
TOT-0006: 20120822000000
TZAT-0001FASTVT: 1
TZAT-0001FASTVT: 2
TZAT-0002FASTVT: NEGATIVE
TZAT-0002FASTVT: NEGATIVE
TZAT-0004FASTVT: 8
TZAT-0004FASTVT: 8
TZAT-0004SLOWVT: 8
TZAT-0004SLOWVT: 8
TZAT-0005SLOWVT: 91 pct
TZAT-0011SLOWVT: 10 ms
TZAT-0011SLOWVT: 10 ms
TZAT-0012SLOWVT: 200 ms
TZAT-0012SLOWVT: 200 ms
TZAT-0013SLOWVT: 2
TZAT-0013SLOWVT: 2
TZAT-0020FASTVT: 1.6 ms
TZAT-0020FASTVT: 1.6 ms
TZAT-0020SLOWVT: 1.6 ms
TZAT-0020SLOWVT: 1.6 ms
TZON-0003SLOWVT: 330 ms
TZON-0004SLOWVT: 16
TZON-0011AFLUTTER: 70
TZST-0001FASTVT: 3
TZST-0001FASTVT: 5
TZST-0001SLOWVT: 4
TZST-0001SLOWVT: 6
TZST-0002FASTVT: NEGATIVE
TZST-0003FASTVT: 25 J
TZST-0003SLOWVT: 25 J
TZST-0003SLOWVT: 35 J
TZST-0003SLOWVT: 35 J

## 2011-07-30 NOTE — Progress Notes (Signed)
Remote defib check  

## 2011-08-03 ENCOUNTER — Encounter: Payer: Self-pay | Admitting: Internal Medicine

## 2011-08-05 ENCOUNTER — Other Ambulatory Visit: Payer: BC Managed Care – PPO | Admitting: *Deleted

## 2011-08-05 DIAGNOSIS — I5022 Chronic systolic (congestive) heart failure: Secondary | ICD-10-CM

## 2011-08-05 LAB — BASIC METABOLIC PANEL
CO2: 35 mEq/L — ABNORMAL HIGH (ref 19–32)
Chloride: 95 mEq/L — ABNORMAL LOW (ref 96–112)
Creatinine, Ser: 1.5 mg/dL — ABNORMAL HIGH (ref 0.4–1.2)
Sodium: 141 mEq/L (ref 135–145)

## 2011-08-12 ENCOUNTER — Encounter: Payer: Self-pay | Admitting: *Deleted

## 2011-08-20 ENCOUNTER — Ambulatory Visit (HOSPITAL_COMMUNITY)
Admission: RE | Admit: 2011-08-20 | Discharge: 2011-08-20 | Disposition: A | Discharge status: Home / Self Care | Payer: BC Managed Care – PPO | Source: Ambulatory Visit | Attending: Internal Medicine | Admitting: Internal Medicine

## 2011-08-20 VITALS — BP 108/58 | HR 75 | Wt 242.0 lb

## 2011-08-20 DIAGNOSIS — I5022 Chronic systolic (congestive) heart failure: Secondary | ICD-10-CM

## 2011-08-20 DIAGNOSIS — I509 Heart failure, unspecified: Secondary | ICD-10-CM | POA: Insufficient documentation

## 2011-08-20 DIAGNOSIS — I517 Cardiomegaly: Secondary | ICD-10-CM

## 2011-08-20 DIAGNOSIS — I10 Essential (primary) hypertension: Secondary | ICD-10-CM | POA: Insufficient documentation

## 2011-08-20 NOTE — Progress Notes (Signed)
*  PRELIMINARY RESULTS* Echocardiogram 2D Echocardiogram has been performed.  Clide Deutscher 08/20/2011, 9:41 AM

## 2011-08-20 NOTE — Patient Instructions (Addendum)
Follow up in 4 months  Do the following things EVERYDAY: 1) Weigh yourself in the morning before breakfast. Write it down and keep it in a log. 2) Take your medicines as prescribed 3) Eat low salt foods--Limit salt (sodium) to 2000mg per day.  4) Stay as active as you can everyday 

## 2011-08-20 NOTE — Progress Notes (Signed)
Patient ID: [name], female   DOB: 1975-12-30, 36 y.o.   MRN: [id number]    HPI:  36 year old morbidly obese woman with nonischemic cardiomyopathy, hypertension,  chronic systolic heart failure and obesity.   Last ECHO 2007 25% EF   Cardiac Cath 2007 -Findings consistent with nonischemic, mildly dilated cardiomyopathy. This is probably related to hypertension with hypertensive heart disease. Severe left ventricular systolic dysfunction, ejection fraction around 35% with moderate to marked global hypokinesis. Moderate pulmonary hypertension. Preserved cardiac output and cardiac index.  04/13/2011 BMET Potassium 3.9, BUN 20, Creatinine 0.99 BNP 192 ECHO 04/27/2011 45-50%.  10-15Creatinine 1.32 Potassium 4. 0 11/7 Cr 1.35 K 3.4 1/7 Creatinine 1.5 Potassium 3.8  1/24 ECHO 55-60%  ACEI/ARB/Spiro which is contraindicated, lotrel stopped.    She returns for follow up today.  Complains of nausea and fatigue. . Productive cough. Currently un-employed. Exercising 4 x a week. Walking 2 1/2 miles.  Taking Carvedilol 3.125 mg in am and 25 mg in pm and Spironolactone 25 mg daily. Weight at home 234-236.   Following low sodium diet.  No lower extremity edema.  No orthopnea/PND/CP.     ROS: All systems negative except as listed in HPI, PMH and Problem List.  Past Medical History  Diagnosis Date  . Other primary cardiomyopathies   . Atrial fibrillation   . Paroxysmal ventricular tachycardia   . CHF (congestive heart failure)   . Hypertension   . Acute bronchitis and bronchiolitis     Current Outpatient Prescriptions  Medication Sig Dispense Refill  . carvedilol (COREG) 25 MG tablet Take 25 mg by mouth at bedtime.        . carvedilol (COREG) 3.125 MG tablet Take 1 tablet (3.125 mg total) by mouth daily at 6 (six) AM.  30 tablet  6  . cetirizine (ZYRTEC) 10 MG tablet Take 10 mg by mouth daily.        . Cholecalciferol (VITAMIN D) 2000 UNITS tablet Take 2,000 Units by mouth daily.        .  digoxin (LANOXIN) 0.25 MG tablet Take 250 mcg by mouth daily.        . furosemide (LASIX) 80 MG tablet Take 1 tablet (80 mg total) by mouth 2 (two) times daily.  60 tablet  6  . Magnesium 250 MG TABS Take 1 tablet by mouth daily.        . multivitamin (THERAGRAN) per tablet Take 1 tablet by mouth daily.        Marland Kitchen olmesartan-hydrochlorothiazide (BENICAR HCT) 40-25 MG per tablet Take 1 tablet by mouth daily.        Marland Kitchen omega-3 fish oil (MAXEPA) 1000 MG CAPS capsule Take by mouth daily.        . ranitidine (ZANTAC) 150 MG capsule Take 150 mg by mouth daily.        . rosuvastatin (CRESTOR) 5 MG tablet Take 5 mg by mouth daily.        Marland Kitchen spironolactone (ALDACTONE) 25 MG tablet Take 1 tablet (25 mg total) by mouth daily.  30 tablet  6  . vitamin C (ASCORBIC ACID) 500 MG tablet Take 500 mg by mouth daily.           PHYSICAL EXAM: Filed Vitals:   08/20/11 1052  BP: 108/58  Pulse: 75  Weight   242 (244)  General:  Well appearing. No resp difficulty HEENT: normal Neck: supple. JVP 5-6 Carotids 2+ bilaterally; no bruits. No lymphadenopathy or thryomegaly appreciated. Cor: PMI normal.  Regular rate & rhythm. No rubs, gallops or murmurs. Lungs: clear Abdomen: soft, nontender, distended and obese. No hepatosplenomegaly. No bruits or masses. Good bowel sounds. Extremities: no cyanosis, clubbing, rash,  Trace lower extremity  edema Neuro: alert & orientedx3, cranial nerves grossly intact. Moves all 4 extremities w/o difficulty. Affect pleasant.       ASSESSMENT & PLAN:

## 2011-08-20 NOTE — Assessment & Plan Note (Addendum)
NYHA I. Volume status stable. ECHO results reviewed . LV function normalized. Continue current medical regimen. Encouraged to continue to lose weight.  Instructed to use CPAP. Follow up in 4 months.   Patient seen and examined with Tonye Becket, NP. We discussed all aspects of the encounter. I agree with the assessment and plan as stated above.  We reviewed echo results. EF back to normal. Continue current meds and weight loss attempts.

## 2011-08-29 NOTE — Progress Notes (Signed)
Patient seen and examined with Amy Clegg, NP. We discussed all aspects of the encounter. I agree with the assessment and plan as stated above.   

## 2011-10-09 ENCOUNTER — Encounter: Payer: Self-pay | Admitting: Internal Medicine

## 2011-10-29 ENCOUNTER — Encounter: Payer: BC Managed Care – PPO | Admitting: *Deleted

## 2011-11-05 ENCOUNTER — Encounter: Payer: Self-pay | Admitting: *Deleted

## 2011-12-17 ENCOUNTER — Encounter (HOSPITAL_COMMUNITY): Payer: Self-pay

## 2011-12-17 ENCOUNTER — Ambulatory Visit (HOSPITAL_COMMUNITY)
Admission: RE | Admit: 2011-12-17 | Discharge: 2011-12-17 | Disposition: A | Discharge status: Home / Self Care | Payer: BC Managed Care – PPO | Source: Ambulatory Visit | Attending: Internal Medicine | Admitting: Internal Medicine

## 2011-12-17 VITALS — BP 104/55 | HR 66 | Ht 61.5 in | Wt 227.8 lb

## 2011-12-17 DIAGNOSIS — R42 Dizziness and giddiness: Secondary | ICD-10-CM | POA: Insufficient documentation

## 2011-12-17 DIAGNOSIS — I5022 Chronic systolic (congestive) heart failure: Secondary | ICD-10-CM | POA: Insufficient documentation

## 2011-12-17 DIAGNOSIS — I428 Other cardiomyopathies: Secondary | ICD-10-CM

## 2011-12-17 MED ORDER — FUROSEMIDE 80 MG PO TABS: 80.0000 mg | ORAL_TABLET | Freq: Every day | ORAL | Status: DC | PRN

## 2011-12-17 NOTE — Progress Notes (Addendum)
PCP: Dr. Kellie Shropshire   HPI:  36 year old morbidly obese woman with h/o chronic systolic heart failure due to nonischemic cardiomyopathy (now with recovered EF), hypertension, and obesity.   ECHO 2007 25% EF   Cardiac Cath 2007 -Findings consistent with nonischemic, mildly dilated cardiomyopathy. This is probably related to hypertension with hypertensive heart disease. Severe left ventricular systolic dysfunction, ejection fraction around 35% with moderate to marked global hypokinesis. Moderate pulmonary hypertension. Preserved cardiac output and cardiac index.  04/13/2011 BMET Potassium 3.9, BUN 20, Creatinine 0.99 BNP 192 ECHO 04/27/2011 45-50%.  08/20/11 ECHO 55-60%  She is here for follow up today.  She feels ok.  Dizziness when she stands up or bends over.  Feels like she may pass out but no syncope.  Has had 2 falls though.  Weighing at home 217, steadily losing weight.  Walking and jogging on treadmill 5x week.  Eating better.  No lower extremity edema.  No dyspnea/orthopnea/PND.  Had blood drawn at her PCP 5 days ago.    ROS: All systems negative except as listed in HPI, PMH and Problem List.  Past Medical History  Diagnosis Date  . Other primary cardiomyopathies   . Atrial fibrillation   . Ventricular tachycardia, inducible   . CHF (congestive heart failure)   . Hypertension   . Acute bronchitis and bronchiolitis     Current Outpatient Prescriptions  Medication Sig Dispense Refill  . carvedilol (COREG) 25 MG tablet Take 25 mg by mouth at bedtime.        . carvedilol (COREG) 3.125 MG tablet Take 1 tablet (3.125 mg total) by mouth daily at 6 (six) AM.  30 tablet  6  . cetirizine (ZYRTEC) 10 MG tablet Take 10 mg by mouth daily.        . Cholecalciferol (VITAMIN D) 2000 UNITS tablet Take 2,000 Units by mouth daily.        . digoxin (LANOXIN) 0.25 MG tablet Take 250 mcg by mouth daily.        . furosemide (LASIX) 80 MG tablet Take 80 mg by mouth daily.      . Magnesium 250 MG TABS  Take 1 tablet by mouth daily.        . multivitamin (THERAGRAN) per tablet Take 1 tablet by mouth daily.        Marland Kitchen olmesartan-hydrochlorothiazide (BENICAR HCT) 40-25 MG per tablet Take 1 tablet by mouth daily.        Marland Kitchen omega-3 fish oil (MAXEPA) 1000 MG CAPS capsule Take by mouth daily.        . ranitidine (ZANTAC) 150 MG capsule Take 150 mg by mouth daily.        . rosuvastatin (CRESTOR) 5 MG tablet Take 5 mg by mouth daily.        Marland Kitchen spironolactone (ALDACTONE) 25 MG tablet Take 1 tablet (25 mg total) by mouth daily.  30 tablet  6  . vitamin C (ASCORBIC ACID) 500 MG tablet Take 500 mg by mouth daily.        Marland Kitchen DISCONTD: furosemide (LASIX) 80 MG tablet Take 1 tablet (80 mg total) by mouth 2 (two) times daily.  60 tablet  6     PHYSICAL EXAM: Filed Vitals:   12/17/11 0916 12/17/11 0918 12/17/11 0919 12/17/11 0923  BP: 114/61 99/50 106/56 104/55  Pulse: 63 62 68 66  Height:      Weight:        General:  Well appearing. No resp difficulty HEENT: normal Neck:  supple. JVP 5-6 Carotids 2+ bilaterally; no bruits. No lymphadenopathy or thryomegaly appreciated. Cor: PMI normal. Regular rate & rhythm. No rubs, gallops or murmurs. Lungs: clear Abdomen: soft, nontender, distended and obese. No hepatosplenomegaly. No bruits or masses. Good bowel sounds. Extremities: no cyanosis, clubbing, rash,  Trace lower extremity  edema Neuro: alert & orientedx3, cranial nerves grossly intact. Moves all 4 extremities w/o difficulty. Affect pleasant.       ASSESSMENT & PLAN:

## 2011-12-17 NOTE — Patient Instructions (Addendum)
Will stop lasix, spironolactone and digoxin.    May use your lasix as needed for edema.   Follow up 6-8 weeks.

## 2011-12-17 NOTE — Assessment & Plan Note (Signed)
Most likely due to over diuresis.  Will stop lasix and spironolactone at this time.  She can use lasix as needed for weight gain or edema.  If her dizziness does not improve she is to call the office.  Encouraged her to continue with her weight loss program.  Follow up 6 weeks.

## 2011-12-17 NOTE — Assessment & Plan Note (Addendum)
EF improved by echo last visit.  As above will stop lasix, spiro and digoxin.  Continue Benicar HCT and carvedilol.    Patient seen and examined with Ulyess Blossom, PA-C. We discussed all aspects of the encounter. I agree with the assessment and plan as stated above. Echo reviewed with her. Doing very well. No clinical HF. EF completely recovered. Congratulated her on exercise program. Will stop spiro, digoxin and lasix. Can use lasix prn. Continue ARB and b-blocker for BP control and HF prevention.

## 2011-12-21 ENCOUNTER — Encounter: Payer: Self-pay | Admitting: *Deleted

## 2012-01-06 ENCOUNTER — Other Ambulatory Visit: Payer: Self-pay | Admitting: Cardiology

## 2012-01-06 MED ORDER — FUROSEMIDE 80 MG PO TABS: 80.0000 mg | ORAL_TABLET | Freq: Every day | ORAL | Status: DC | PRN

## 2012-01-26 ENCOUNTER — Encounter (HOSPITAL_COMMUNITY): Payer: Self-pay

## 2012-01-26 ENCOUNTER — Ambulatory Visit (HOSPITAL_COMMUNITY)
Admission: RE | Admit: 2012-01-26 | Discharge: 2012-01-26 | Disposition: A | Discharge status: Home / Self Care | Payer: BC Managed Care – PPO | Source: Ambulatory Visit | Attending: Internal Medicine | Admitting: Internal Medicine

## 2012-01-26 VITALS — BP 126/80 | HR 75 | Ht 61.5 in | Wt 227.0 lb

## 2012-01-26 DIAGNOSIS — I4891 Unspecified atrial fibrillation: Secondary | ICD-10-CM | POA: Insufficient documentation

## 2012-01-26 DIAGNOSIS — I43 Cardiomyopathy in diseases classified elsewhere: Secondary | ICD-10-CM

## 2012-01-26 DIAGNOSIS — I472 Ventricular tachycardia, unspecified: Secondary | ICD-10-CM | POA: Insufficient documentation

## 2012-01-26 DIAGNOSIS — I119 Hypertensive heart disease without heart failure: Secondary | ICD-10-CM | POA: Insufficient documentation

## 2012-01-26 DIAGNOSIS — Z95810 Presence of automatic (implantable) cardiac defibrillator: Secondary | ICD-10-CM | POA: Insufficient documentation

## 2012-01-26 DIAGNOSIS — I10 Essential (primary) hypertension: Secondary | ICD-10-CM

## 2012-01-26 DIAGNOSIS — I428 Other cardiomyopathies: Secondary | ICD-10-CM | POA: Insufficient documentation

## 2012-01-26 DIAGNOSIS — J218 Acute bronchiolitis due to other specified organisms: Secondary | ICD-10-CM | POA: Insufficient documentation

## 2012-01-26 DIAGNOSIS — I4729 Other ventricular tachycardia: Secondary | ICD-10-CM | POA: Insufficient documentation

## 2012-01-26 DIAGNOSIS — I2789 Other specified pulmonary heart diseases: Secondary | ICD-10-CM | POA: Insufficient documentation

## 2012-01-26 DIAGNOSIS — J40 Bronchitis, not specified as acute or chronic: Secondary | ICD-10-CM | POA: Insufficient documentation

## 2012-01-26 NOTE — Progress Notes (Signed)
PCP: Dr. Kellie Shropshire   HPI:  36 year old morbidly obese woman with h/o chronic systolic heart failure due to nonischemic cardiomyopathy (previously 25% in 2007 but now with recovered EF) s/p ICD implantation, hypertension, and obesity.    Cardiac Cath 2007 -Findings consistent with nonischemic, mildly dilated cardiomyopathy. This is probably related to hypertension with hypertensive heart disease. Severe left ventricular systolic dysfunction, ejection fraction around 35% with moderate to marked global hypokinesis. Moderate pulmonary hypertension. Preserved cardiac output and cardiac index.  ECHO 04/27/2011 45-50%. 08/20/11 ECHO 55-60%  She returns for follow up today.  Feels good today.  Dizziness improved after stopping spiro, lasix and digoxin.  Has taken a lasix tablet twice in last 2 months.  Walking 5 x per week about 35-45 minutes.  Started new job, Transport planner at [employer].  Eating better.  No lower extremity edema.  No dyspnea/orthopnea/PND.   ROS: All systems negative except as listed in HPI, PMH and Problem List.  Past Medical History  Diagnosis Date  . Other primary cardiomyopathies   . Atrial fibrillation   . Paroxysmal ventricular tachycardia   . CHF (congestive heart failure)   . Hypertension   . Acute bronchitis and bronchiolitis     Current Outpatient Prescriptions  Medication Sig Dispense Refill  . carvedilol (COREG) 25 MG tablet Take 25 mg by mouth at bedtime.        . carvedilol (COREG) 3.125 MG tablet Take 1 tablet (3.125 mg total) by mouth daily at 6 (six) AM.  30 tablet  6  . cetirizine (ZYRTEC) 10 MG tablet Take 10 mg by mouth daily.        . Cholecalciferol (VITAMIN D) 2000 UNITS tablet Take 2,000 Units by mouth daily.        . Ciprofloxacin (CIPRO PO) Take by mouth as directed.      . furosemide (LASIX) 80 MG tablet Take 1 tablet (80 mg total) by mouth daily as needed.  60 tablet  2  . Magnesium 250 MG TABS Take 1 tablet by mouth daily.          . multivitamin (THERAGRAN) per tablet Take 1 tablet by mouth daily.        Marland Kitchen olmesartan-hydrochlorothiazide (BENICAR HCT) 40-25 MG per tablet Take 1 tablet by mouth daily.        Marland Kitchen omega-3 fish oil (MAXEPA) 1000 MG CAPS capsule Take by mouth daily.        . ranitidine (ZANTAC) 150 MG capsule Take 150 mg by mouth daily.        . rosuvastatin (CRESTOR) 5 MG tablet Take 5 mg by mouth daily.        . vitamin C (ASCORBIC ACID) 500 MG tablet Take 500 mg by mouth daily.           PHYSICAL EXAM: Filed Vitals:   01/26/12 1539  BP: 126/80  Pulse: 75  Height: 5' 1.5" (1.562 m)  Weight: 227 lb (102.967 kg)  SpO2: 98%    General:  Well appearing. No resp difficulty HEENT: normal Neck: supple. JVP 6-7 Carotids 2+ bilaterally; no bruits. No lymphadenopathy or thryomegaly appreciated. Cor: PMI normal. Regular rate & rhythm. No rubs, gallops or murmurs. Lungs: clear Abdomen: soft, nontender, distended and obese. No hepatosplenomegaly. No bruits or masses. Good bowel sounds. Extremities: no cyanosis, clubbing, rash,  edema Neuro: alert & orientedx3, cranial nerves grossly intact. Moves all 4 extremities w/o difficulty. Affect pleasant.       ASSESSMENT & PLAN:

## 2012-01-26 NOTE — Patient Instructions (Addendum)
Continue current medications.    Follow up 4 month with Dr. Gala Romney

## 2012-02-03 DIAGNOSIS — I119 Hypertensive heart disease without heart failure: Secondary | ICD-10-CM | POA: Insufficient documentation

## 2012-02-03 NOTE — Assessment & Plan Note (Addendum)
She is doing well.  Her EF has recovered by echo in Jan of this year and currently without HF symptoms.  Will continue current medications and prn lasix.  She also had questions today about pregnancy.  She is currently not trying but would like to discuss risk of pregnancy in the future.  Would like to wait until she has been recovered for at least a year.  Will have her follow up with Dr. Gala Romney in the fall for further discussion of pregnancy as there is some risk of depressed EF.  She agrees to this plan.  She will call if anything changes.

## 2012-02-03 NOTE — Assessment & Plan Note (Signed)
Well controlled Continue current therapy 

## 2012-02-10 ENCOUNTER — Other Ambulatory Visit (HOSPITAL_COMMUNITY): Payer: Self-pay | Admitting: Physician Assistant

## 2012-02-29 ENCOUNTER — Telehealth (HOSPITAL_COMMUNITY): Payer: Self-pay | Admitting: Internal Medicine

## 2012-02-29 MED ORDER — CARVEDILOL 3.125 MG PO TABS: ORAL_TABLET | ORAL | Status: DC

## 2012-02-29 NOTE — Telephone Encounter (Signed)
Pt called and stated she has found she is 3 or [redacted] weeks pregnant. Pt states she needs you to cal her and let her know which meds she can take ans which ones she cant. Thanks

## 2012-02-29 NOTE — Telephone Encounter (Signed)
Per Dr Gala Romney stop Benicar and Crestor, monitor BP and f/u w/echo in 2 months, pt is aware

## 2012-03-04 MED ORDER — LABETALOL HCL 100 MG PO TABS: 200.0000 mg | ORAL_TABLET | Freq: Two times a day (BID) | ORAL | Status: DC

## 2012-03-04 MED ORDER — NIFEDIPINE ER OSMOTIC RELEASE 30 MG PO TB24: 30.0000 mg | ORAL_TABLET | Freq: Every day | ORAL | Status: DC | PRN

## 2012-03-04 NOTE — Addendum Note (Signed)
Addended by: Hadassah Pais on: 03/04/2012 04:48 PM   Modules accepted: Orders

## 2012-03-04 NOTE — Telephone Encounter (Signed)
Patient called complaining of HA and elevated SBP in the 140s since stopping her benicar.  Have discussed with Dr. Gala Romney and will change carvedilol to labetalol.  She will also have an as needed nifedipine rx for SBP >140.  The patient is aware and appreciates the call back.

## 2012-03-22 ENCOUNTER — Encounter: Payer: BC Managed Care – PPO | Admitting: Obstetrics and Gynecology

## 2012-03-22 ENCOUNTER — Encounter: Payer: Self-pay | Admitting: *Deleted

## 2012-04-04 ENCOUNTER — Ambulatory Visit (INDEPENDENT_AMBULATORY_CARE_PROVIDER_SITE_OTHER): Payer: 59 | Admitting: Obstetrics and Gynecology

## 2012-04-04 ENCOUNTER — Encounter: Payer: Self-pay | Admitting: Obstetrics and Gynecology

## 2012-04-04 ENCOUNTER — Other Ambulatory Visit (INDEPENDENT_AMBULATORY_CARE_PROVIDER_SITE_OTHER): Payer: 59

## 2012-04-04 VITALS — BP 100/64 | Ht 61.5 in | Wt 234.0 lb

## 2012-04-04 DIAGNOSIS — Z331 Pregnant state, incidental: Secondary | ICD-10-CM

## 2012-04-04 DIAGNOSIS — O368390 Maternal care for abnormalities of the fetal heart rate or rhythm, unspecified trimester, not applicable or unspecified: Secondary | ICD-10-CM

## 2012-04-04 DIAGNOSIS — Z95810 Presence of automatic (implantable) cardiac defibrillator: Secondary | ICD-10-CM | POA: Insufficient documentation

## 2012-04-04 DIAGNOSIS — O169 Unspecified maternal hypertension, unspecified trimester: Secondary | ICD-10-CM | POA: Insufficient documentation

## 2012-04-04 DIAGNOSIS — Z3201 Encounter for pregnancy test, result positive: Secondary | ICD-10-CM

## 2012-04-04 DIAGNOSIS — O139 Gestational [pregnancy-induced] hypertension without significant proteinuria, unspecified trimester: Secondary | ICD-10-CM

## 2012-04-04 DIAGNOSIS — Z950 Presence of cardiac pacemaker: Secondary | ICD-10-CM | POA: Insufficient documentation

## 2012-04-04 DIAGNOSIS — N760 Acute vaginitis: Secondary | ICD-10-CM

## 2012-04-04 DIAGNOSIS — A499 Bacterial infection, unspecified: Secondary | ICD-10-CM

## 2012-04-04 LAB — POCT WET PREP (WET MOUNT): pH: 5

## 2012-04-04 LAB — POCT URINALYSIS DIPSTICK
Bilirubin, UA: NEGATIVE
Nitrite, UA: NEGATIVE
pH, UA: 5

## 2012-04-04 LAB — US OB COMP LESS 14 WKS

## 2012-04-04 MED ORDER — METRONIDAZOLE 500 MG PO TABS: 500.0000 mg | ORAL_TABLET | Freq: Two times a day (BID) | ORAL | Status: AC

## 2012-04-04 NOTE — Progress Notes (Signed)
Subjective:    [name] is being seen today for her first obstetrical visit at [redacted]w[redacted]d gestation by LMP 01/22/12 (regular cycles) EDD 10/28/12 The patient had USS today with GA [redacted]w[redacted]d EDD 11/03/12  She reports feeling a little tired  Her obstetrical history is significant for: Patient Active Problem List  Diagnosis  . BV (bacterial vaginosis)  . Pacemaker  . Cardiac defibrillator in situ  . Hypertension complicating pregnancy    Relationship with FOB:  Married to patient and involved.  Patient does intend to breast feed.   Pregnancy history fully reviewed. The patient had CHF in 2007. Fitted with Defib/Pacemaker (Left Upper Chest) CHF now resolved. Cardiologist Dr Adriana Reams. To have f/u Cardiac Echo Nov 2013. Chronic Hypertension - taking Labetalol 200 mg po BID daily.   Review of Systems Pertinent ROS is described in HPI   Objective:   BP 100/64  Ht 5' 1.5" (1.562 m)  Wt 234 lb (106.142 kg)  BMI 43.50 kg/m2  LMP 01/22/2012 Wt Readings from Last 1 Encounters:  04/04/12 234 lb (106.142 kg)   BMI: Body mass index is 43.50 kg/(m^2). Early Glucola recommended - patient advised  General: alert, cooperative and no distress Respiratory: clear to auscultation bilaterally Cardiovascular: regular rate and rhythm, S1, S2 normal, no murmur Breasts:  No dominant masses, nipples erect Gastrointestinal: soft, non-tender; no masses,  no organomegaly Extremities: extremities normal, no pain or edema Vaginal Bleeding: None  EXTERNAL GENITALIA: normal appearing vulva with no masses, tenderness or lesions VAGINA: no abnormal discharge or lesions CERVIX: no lesions or cervical motion tenderness; cervix closed, long, firm UTERUS: gravid and consistent with 10 weeks ADNEXA: no masses palpable and nontender OB EXAM PELVIMETRY: appears adequate   FHR:  133  bpm  Assessment:    Pregnancy at  [redacted]w[redacted]d  Plan:     Prenatal panel reviewed and discussed with the patient:yes Pap smear  collected:no to get result from dr Kellie Shropshire MD at [facility] (Pap 2012 - normal) GC/Chlamydia collected: Wet prep:  PH 4.5 Clue cells, BV. Discussion of Genetic testing options: would consider testing Prenatal vitamins recommended Problem list reviewed and updated yes.  Plan of care: Follow up in 4 weeks for ROB Tx BV with Flagyl 500 mgs po BID x 7 days To co-ordinate Cardiac Care with Dr Lorie Apley, Jericho Alcorn CNM, [place] 04/04/2012 6:08 PM

## 2012-04-04 NOTE — Progress Notes (Deleted)
[redacted]w[redacted]d Subjective:                                                                Subjective:    Subjective:    [name] is being seen today for her first obstetrical visit at [redacted]w[redacted]d gestation by ***.  She reports ***  Her obstetrical history is significant for: There is no problem list on file for this patient.   Relationship with FOB:  ***  Patient {does/does not:19097} intend to breast feed.   Pregnancy history fully reviewed.  {Common ambulatory SmartLinks:19316}  Review of Systems Pertinent ROS is described in HPI   Objective:   BP 100/64  Ht 5' 1.5" (1.562 m)  Wt 234 lb (106.142 kg)  BMI 43.50 kg/m2  LMP 01/22/2012 Wt Readings from Last 1 Encounters:  04/04/12 234 lb (106.142 kg)   BMI: Body mass index is 43.50 kg/(m^2).  General: alert, cooperative and no distress Respiratory: clear to auscultation bilaterally Cardiovascular: regular rate and rhythm, S1, S2 normal, no murmur Breasts:  No dominant masses, nipples erect Gastrointestinal: soft, non-tender; no masses,  no organomegaly Extremities: extremities normal, no pain or edema Vaginal Bleeding: None  EXTERNAL GENITALIA: normal appearing vulva with no masses, tenderness or lesions VAGINA: no abnormal discharge or lesions CERVIX: no lesions or cervical motion tenderness; cervix closed, long, firm UTERUS: gravid and consistent with *** weeks ADNEXA: no masses palpable and nontender OB EXAM PELVIMETRY: {pe pelvimetry:313269::"appears adequate"}   FHR:  ***  bpm  Assessment:    Pregnancy at   Plan:     Prenatal panel reviewed and discussed with the patient:{YES NO:22349::"Labs drawn today"} Pap smear collected:{YES NO:22349::"Not applicable"} GC/Chlamydia collected:{YES NO:22349} Wet prep:  *** Discussion of Genetic testing options: *** Prenatal vitamins recommended Problem list reviewed and updated.  Plan of care: Follow up in *** weeks for ***  [name] CNM,  [place] 04/04/2012 4:44 PM

## 2012-04-05 LAB — PRENATAL PANEL VII
Antibody Screen: NEGATIVE
Eosinophils Relative: 2 % (ref 0–5)
HCT: 32.2 % — ABNORMAL LOW (ref 36.0–46.0)
Lymphocytes Relative: 33 % (ref 12–46)
Lymphs Abs: 2.5 10*3/uL (ref 0.7–4.0)
MCV: 79.5 fL (ref 78.0–100.0)
Monocytes Absolute: 0.5 10*3/uL (ref 0.1–1.0)
Monocytes Relative: 7 % (ref 3–12)
RBC: 4.05 MIL/uL (ref 3.87–5.11)
Rh Type: POSITIVE
Rubella: 185.1 IU/mL — ABNORMAL HIGH
WBC: 7.7 10*3/uL (ref 4.0–10.5)

## 2012-04-05 LAB — GC/CHLAMYDIA PROBE AMP, GENITAL
Chlamydia, DNA Probe: NEGATIVE
GC Probe Amp, Genital: NEGATIVE

## 2012-04-06 LAB — CULTURE, OB URINE
Colony Count: NO GROWTH
Organism ID, Bacteria: NO GROWTH

## 2012-04-06 LAB — HEMOGLOBINOPATHY EVALUATION: Hgb S Quant: 0 %

## 2012-05-05 ENCOUNTER — Ambulatory Visit (INDEPENDENT_AMBULATORY_CARE_PROVIDER_SITE_OTHER): Payer: 59 | Admitting: Obstetrics and Gynecology

## 2012-05-05 ENCOUNTER — Ambulatory Visit (INDEPENDENT_AMBULATORY_CARE_PROVIDER_SITE_OTHER): Payer: 59

## 2012-05-05 ENCOUNTER — Other Ambulatory Visit: Payer: Self-pay | Admitting: Obstetrics and Gynecology

## 2012-05-05 VITALS — BP 110/78 | Wt 233.0 lb

## 2012-05-05 DIAGNOSIS — D649 Anemia, unspecified: Secondary | ICD-10-CM

## 2012-05-05 DIAGNOSIS — O169 Unspecified maternal hypertension, unspecified trimester: Secondary | ICD-10-CM

## 2012-05-05 DIAGNOSIS — Z3490 Encounter for supervision of normal pregnancy, unspecified, unspecified trimester: Secondary | ICD-10-CM

## 2012-05-05 DIAGNOSIS — Z331 Pregnant state, incidental: Secondary | ICD-10-CM

## 2012-05-05 DIAGNOSIS — D539 Nutritional anemia, unspecified: Secondary | ICD-10-CM | POA: Insufficient documentation

## 2012-05-05 LAB — US OB LIMITED

## 2012-05-05 NOTE — Progress Notes (Signed)
Pt stated no issues today.  

## 2012-05-05 NOTE — Progress Notes (Signed)
[redacted]w[redacted]d The patient had been at Primary care and had Random Blood Glucose Check = 87 Also had thyroid panel completed which was reported as normal Continues on Labetalol 100mg s BID Nausea is improving. The patient has complained of some abdominal cramping c/w uterine growth. FHT's as per USS:       bpm  FH to dates. No change in vaginal secretions. USS for FHT's FHR 155 bpm Bilateral simple ovarian cyst, Normal adenexa Anemia: to take Floridix Liquid Iron 10ml po BID - the patient will get from [employer] today.  1 Hr GTT at 18 weeks next visit together with CBC and Quad screem ROB x 4 weeks

## 2012-05-12 ENCOUNTER — Other Ambulatory Visit: Payer: Self-pay | Admitting: Neurology

## 2012-05-12 DIAGNOSIS — H4710 Unspecified papilledema: Secondary | ICD-10-CM

## 2012-05-16 ENCOUNTER — Ambulatory Visit
Admission: RE | Admit: 2012-05-16 | Discharge: 2012-05-16 | Disposition: A | Discharge status: Home / Self Care | Payer: 59 | Source: Ambulatory Visit | Attending: Neurology | Admitting: Neurology

## 2012-05-16 DIAGNOSIS — H4710 Unspecified papilledema: Secondary | ICD-10-CM

## 2012-05-17 ENCOUNTER — Other Ambulatory Visit: Payer: Self-pay | Admitting: Neurology

## 2012-05-17 ENCOUNTER — Telehealth: Payer: Self-pay | Admitting: Obstetrics and Gynecology

## 2012-05-17 DIAGNOSIS — H4710 Unspecified papilledema: Secondary | ICD-10-CM

## 2012-05-18 ENCOUNTER — Telehealth: Payer: Self-pay | Admitting: Internal Medicine

## 2012-05-18 ENCOUNTER — Telehealth: Payer: Self-pay | Admitting: Obstetrics and Gynecology

## 2012-05-18 NOTE — Telephone Encounter (Signed)
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

## 2012-05-18 NOTE — Telephone Encounter (Signed)
05-18-12 lmm to call to set up past due pacer ck , was due 02-2012 with taylor/or brooke/mt

## 2012-05-19 ENCOUNTER — Encounter (HOSPITAL_COMMUNITY): Payer: Self-pay

## 2012-05-19 ENCOUNTER — Ambulatory Visit (HOSPITAL_COMMUNITY)
Admission: RE | Admit: 2012-05-19 | Discharge: 2012-05-19 | Disposition: A | Discharge status: Home / Self Care | Payer: BC Managed Care – PPO | Source: Ambulatory Visit | Attending: Internal Medicine | Admitting: Internal Medicine

## 2012-05-19 ENCOUNTER — Ambulatory Visit (HOSPITAL_BASED_OUTPATIENT_CLINIC_OR_DEPARTMENT_OTHER)
Admission: RE | Admit: 2012-05-19 | Discharge: 2012-05-19 | Disposition: A | Discharge status: Home / Self Care | Payer: BC Managed Care – PPO | Source: Ambulatory Visit | Attending: Internal Medicine | Admitting: Internal Medicine

## 2012-05-19 VITALS — BP 165/99 | HR 69 | Wt 234.8 lb

## 2012-05-19 DIAGNOSIS — I4891 Unspecified atrial fibrillation: Secondary | ICD-10-CM | POA: Insufficient documentation

## 2012-05-19 DIAGNOSIS — I119 Hypertensive heart disease without heart failure: Secondary | ICD-10-CM

## 2012-05-19 DIAGNOSIS — I10 Essential (primary) hypertension: Secondary | ICD-10-CM | POA: Insufficient documentation

## 2012-05-19 DIAGNOSIS — I43 Cardiomyopathy in diseases classified elsewhere: Secondary | ICD-10-CM

## 2012-05-19 DIAGNOSIS — I5022 Chronic systolic (congestive) heart failure: Secondary | ICD-10-CM

## 2012-05-19 DIAGNOSIS — I509 Heart failure, unspecified: Secondary | ICD-10-CM | POA: Insufficient documentation

## 2012-05-19 DIAGNOSIS — I059 Rheumatic mitral valve disease, unspecified: Secondary | ICD-10-CM

## 2012-05-19 MED ORDER — POTASSIUM CHLORIDE CRYS ER 20 MEQ PO TBCR: 20.0000 meq | EXTENDED_RELEASE_TABLET | Freq: Two times a day (BID) | ORAL | Status: DC

## 2012-05-19 MED ORDER — HYDROCHLOROTHIAZIDE 25 MG PO TABS: 25.0000 mg | ORAL_TABLET | Freq: Every day | ORAL | Status: DC

## 2012-05-19 NOTE — Progress Notes (Signed)
  Echocardiogram 2D Echocardiogram has been performed.  [name] 05/19/2012, 9:41 AM

## 2012-05-19 NOTE — Assessment & Plan Note (Addendum)
Uncontrolled.  As above, add HCTZ 25 mg daily.  See back in 2 weeks for further titration as needed.

## 2012-05-19 NOTE — Assessment & Plan Note (Addendum)
EF has fallen since January.  She will need to be fallen closely while she is pregnant and arrangement in a high risk clinic.  Will discuss with Dr. Pennie Rushing.  Will continue labetolol and add HCTZ 25 mg daily.  Re-evaluate in 2 weeks and if SBP remains high will increase labetolol to 300 mg BID.   Patient seen and examined with Ulyess Blossom, PA-C. We discussed all aspects of the encounter. I agree with the assessment and plan as stated above.  Echo reviewed personally. No clinical HF but EF down and BP high. Suspect she has hypertensive CM. Will refer to high-risk OB clinic. Add HCTZ as above. No ACE-I due to teratogenic effects. Will need echos every 4 weeks or so during pregnancy. Titrate labetalol as needed.

## 2012-05-19 NOTE — Patient Instructions (Addendum)
Add HCTZ and potassium daily.  Follow up 2 weeks.

## 2012-05-19 NOTE — Progress Notes (Signed)
PCP: Dr. Kellie Shropshire  OB:  Dr. Pennie Rushing   HPI:  36 year old morbidly obese woman with h/o chronic systolic heart failure due to nonischemic cardiomyopathy (previously 25% in 2007 but now with recovered EF) s/p ICD implantation, hypertension, and obesity.    Cardiac Cath 2007 -Findings consistent with nonischemic, mildly dilated cardiomyopathy. This is probably related to hypertension with hypertensive heart disease. Severe left ventricular systolic dysfunction, ejection fraction around 35% with moderate to marked global hypokinesis. Moderate pulmonary hypertension. Preserved cardiac output and cardiac index.  ECHO 04/27/2011 45-50%. 08/20/11 ECHO 55-60% 05/19/12 EF 40-45%  She returns for follow up today.  She is ~ 4 months pregnant.  Her benicar, coreg, and crestor were stopped when she found out she was pregnant.  She was transitioned to labetolol and nifedipine.  Nifedipine was suppose to be used prn for SBP 140 or greater but she has not been taking this due to migraines/HA.  SBP has been running 110-145 but mostly staying 130-140s.  She denies dyspnea, orthopnea, or PND.  No chest pain.  No edema.      ROS: All systems negative except as listed in HPI, PMH and Problem List.  Past Medical History  Diagnosis Date  . Other primary cardiomyopathies   . Atrial fibrillation   . Paroxysmal ventricular tachycardia   . CHF (congestive heart failure)   . Hypertension   . Acute bronchitis and bronchiolitis     Current Outpatient Prescriptions  Medication Sig Dispense Refill  . Cholecalciferol (VITAMIN D) 2000 UNITS tablet Take 2,000 Units by mouth daily.        Marland Kitchen labetalol (NORMODYNE) 100 MG tablet Take 2 tablets (200 mg total) by mouth 2 (two) times daily.  120 tablet  6  . Magnesium 250 MG TABS Take 1 tablet by mouth daily.        . multivitamin (THERAGRAN) per tablet Take 1 tablet by mouth daily.        Marland Kitchen omega-3 fish oil (MAXEPA) 1000 MG CAPS capsule Take by mouth daily.        . vitamin  C (ASCORBIC ACID) 500 MG tablet Take 500 mg by mouth daily.        . cetirizine (ZYRTEC) 10 MG tablet Take 10 mg by mouth daily.        . Ciprofloxacin (CIPRO PO) Take by mouth as directed.      . furosemide (LASIX) 80 MG tablet Take 1 tablet (80 mg total) by mouth daily as needed.  60 tablet  2  . NIFEdipine (PROCARDIA-XL/ADALAT-CC/NIFEDICAL-XL) 30 MG 24 hr tablet Take 1 tablet (30 mg total) by mouth daily as needed. Daily as needed for elevated blood pressure.  30 tablet  6  . ranitidine (ZANTAC) 150 MG capsule Take 150 mg by mouth daily.           PHYSICAL EXAM: Filed Vitals:   05/19/12 1002  BP: 165/99  Pulse: 69  Weight: 234 lb 12.8 oz (106.505 kg)  SpO2: 100%    General:  Well appearing. No resp difficulty HEENT: normal Neck: supple. JVP 6-7 Carotids 2+ bilaterally; no bruits. No lymphadenopathy or thryomegaly appreciated. Cor: PMI normal. Regular rate & rhythm. No rubs, gallops or murmurs. Lungs: clear Abdomen: soft, nontender, distended and obese. No hepatosplenomegaly. No bruits or masses. Good bowel sounds. Extremities: no cyanosis, clubbing, rash, trace edema Neuro: alert & orientedx3, cranial nerves grossly intact. Moves all 4 extremities w/o difficulty. Affect pleasant.       ASSESSMENT & PLAN:

## 2012-05-23 ENCOUNTER — Encounter (HOSPITAL_COMMUNITY): Payer: Self-pay

## 2012-05-24 ENCOUNTER — Encounter: Payer: Self-pay | Admitting: Obstetrics and Gynecology

## 2012-05-24 ENCOUNTER — Other Ambulatory Visit: Payer: Self-pay | Admitting: Obstetrics and Gynecology

## 2012-05-24 ENCOUNTER — Ambulatory Visit (INDEPENDENT_AMBULATORY_CARE_PROVIDER_SITE_OTHER): Payer: 59 | Admitting: Obstetrics and Gynecology

## 2012-05-24 ENCOUNTER — Ambulatory Visit (INDEPENDENT_AMBULATORY_CARE_PROVIDER_SITE_OTHER): Payer: 59

## 2012-05-24 ENCOUNTER — Telehealth: Payer: Self-pay | Admitting: Obstetrics and Gynecology

## 2012-05-24 VITALS — BP 118/70 | Wt 234.0 lb

## 2012-05-24 DIAGNOSIS — O4690 Antepartum hemorrhage, unspecified, unspecified trimester: Secondary | ICD-10-CM

## 2012-05-24 DIAGNOSIS — N83201 Unspecified ovarian cyst, right side: Secondary | ICD-10-CM

## 2012-05-24 DIAGNOSIS — Z331 Pregnant state, incidental: Secondary | ICD-10-CM

## 2012-05-24 DIAGNOSIS — N83209 Unspecified ovarian cyst, unspecified side: Secondary | ICD-10-CM

## 2012-05-24 DIAGNOSIS — O4410 Placenta previa with hemorrhage, unspecified trimester: Secondary | ICD-10-CM

## 2012-05-24 DIAGNOSIS — O4420 Partial placenta previa NOS or without hemorrhage, unspecified trimester: Secondary | ICD-10-CM

## 2012-05-24 DIAGNOSIS — B373 Candidiasis of vulva and vagina: Secondary | ICD-10-CM

## 2012-05-24 DIAGNOSIS — Z3689 Encounter for other specified antenatal screening: Secondary | ICD-10-CM

## 2012-05-24 DIAGNOSIS — N83202 Unspecified ovarian cyst, left side: Secondary | ICD-10-CM | POA: Insufficient documentation

## 2012-05-24 LAB — POCT WET PREP (WET MOUNT)
Trichomonas Wet Prep HPF POC: NEGATIVE
WBC, Wet Prep HPF POC: NEGATIVE

## 2012-05-24 LAB — US OB LIMITED

## 2012-05-24 MED ORDER — FLUCONAZOLE 150 MG PO TABS: 150.0000 mg | ORAL_TABLET | Freq: Once | ORAL | Status: DC

## 2012-05-24 NOTE — Telephone Encounter (Signed)
TC from pt. States Had pinkish brown D/C last PM and and reddish brown this AM.  Feels "pulling" in navel area. Last IC 05/20/12.  Per VL sched for U/S and ROB today.TC to pt. LM to return call.

## 2012-05-24 NOTE — Addendum Note (Signed)
Addended by: Cornelius Moras on: 05/24/2012 07:03 PM   Modules accepted: Orders

## 2012-05-24 NOTE — Progress Notes (Signed)
Here for pink spotting yesterday, brown today.  Some pin ovary area--hx bilateral simple cysts.  Some vulvar itching externally. IC in previous 48 hours.  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  Cervix 3.67.  Bilateral simple cysts still present--right 3 cm, left 2.94 cm Breech, normal fluid. Gentle wet prep done just inside introitus--yeast. Reviewed findings on US--recommended pelvic rest, nothing in vagina. Rx Diflucan 150 mg po x 1 dose, with 1 refill. OTC Monistat to outside of vulva where irritation present. F/U US as scheduled for anatomy on 06/03/12. Quad screen today To call with any frank bleeding or other issues.

## 2012-05-24 NOTE — Progress Notes (Signed)
[redacted]w[redacted]d C/o brown spotting  cx length u/s 3.67 cm WNL fluid vertical pocket 3.8 cm Marginal Placenta Previa Bilateral simple ovarian cyst noted again, no fluid CDS, nrml adenexas

## 2012-05-25 ENCOUNTER — Other Ambulatory Visit: Payer: Self-pay

## 2012-05-25 DIAGNOSIS — I429 Cardiomyopathy, unspecified: Secondary | ICD-10-CM

## 2012-05-25 LAB — AFP, QUAD SCREEN
Age Alone: 1:148 {titer}
Down Syndrome Scr Risk Est: 1:12300 {titer}
HCG, Total: 13751 m[IU]/mL
MoM for AFP: 1.27
MoM for hCG: 0.89
Open Spina bifida: NEGATIVE
Tri 18 Scr Risk Est: NEGATIVE
uE3 Mom: 0.98

## 2012-06-02 ENCOUNTER — Encounter: Payer: Self-pay | Admitting: Obstetrics and Gynecology

## 2012-06-02 ENCOUNTER — Encounter: Payer: 59 | Admitting: Obstetrics and Gynecology

## 2012-06-02 ENCOUNTER — Encounter (HOSPITAL_COMMUNITY): Payer: BC Managed Care – PPO

## 2012-06-02 ENCOUNTER — Ambulatory Visit (INDEPENDENT_AMBULATORY_CARE_PROVIDER_SITE_OTHER): Payer: 59

## 2012-06-02 ENCOUNTER — Ambulatory Visit (INDEPENDENT_AMBULATORY_CARE_PROVIDER_SITE_OTHER): Payer: 59 | Admitting: Obstetrics and Gynecology

## 2012-06-02 ENCOUNTER — Ambulatory Visit (HOSPITAL_COMMUNITY)
Admission: RE | Admit: 2012-06-02 | Discharge: 2012-06-02 | Disposition: A | Discharge status: Home / Self Care | Payer: BC Managed Care – PPO | Source: Ambulatory Visit | Attending: Internal Medicine | Admitting: Internal Medicine

## 2012-06-02 ENCOUNTER — Other Ambulatory Visit: Payer: 59

## 2012-06-02 VITALS — BP 148/90 | Wt 230.0 lb

## 2012-06-02 VITALS — BP 184/110 | HR 82 | Wt 230.2 lb

## 2012-06-02 DIAGNOSIS — I428 Other cardiomyopathies: Secondary | ICD-10-CM

## 2012-06-02 DIAGNOSIS — O9989 Other specified diseases and conditions complicating pregnancy, childbirth and the puerperium: Secondary | ICD-10-CM | POA: Insufficient documentation

## 2012-06-02 DIAGNOSIS — I2510 Atherosclerotic heart disease of native coronary artery without angina pectoris: Secondary | ICD-10-CM | POA: Insufficient documentation

## 2012-06-02 DIAGNOSIS — Z331 Pregnant state, incidental: Secondary | ICD-10-CM

## 2012-06-02 DIAGNOSIS — I509 Heart failure, unspecified: Secondary | ICD-10-CM | POA: Insufficient documentation

## 2012-06-02 DIAGNOSIS — I5022 Chronic systolic (congestive) heart failure: Secondary | ICD-10-CM | POA: Insufficient documentation

## 2012-06-02 DIAGNOSIS — I119 Hypertensive heart disease without heart failure: Secondary | ICD-10-CM

## 2012-06-02 DIAGNOSIS — O09299 Supervision of pregnancy with other poor reproductive or obstetric history, unspecified trimester: Secondary | ICD-10-CM

## 2012-06-02 DIAGNOSIS — Z3689 Encounter for other specified antenatal screening: Secondary | ICD-10-CM

## 2012-06-02 DIAGNOSIS — Z95810 Presence of automatic (implantable) cardiac defibrillator: Secondary | ICD-10-CM | POA: Insufficient documentation

## 2012-06-02 DIAGNOSIS — O10019 Pre-existing essential hypertension complicating pregnancy, unspecified trimester: Secondary | ICD-10-CM | POA: Insufficient documentation

## 2012-06-02 DIAGNOSIS — I10 Essential (primary) hypertension: Secondary | ICD-10-CM

## 2012-06-02 DIAGNOSIS — O169 Unspecified maternal hypertension, unspecified trimester: Secondary | ICD-10-CM

## 2012-06-02 MED ORDER — LABETALOL HCL 200 MG PO TABS: 400.0000 mg | ORAL_TABLET | Freq: Two times a day (BID) | ORAL | Status: DC

## 2012-06-02 MED ORDER — POTASSIUM CHLORIDE CRYS ER 20 MEQ PO TBCR: 20.0000 meq | EXTENDED_RELEASE_TABLET | Freq: Two times a day (BID) | ORAL | Status: DC

## 2012-06-02 NOTE — Addendum Note (Signed)
Encounter addended by: Theresia Bough, CMA on: 06/02/2012  3:09 PM<BR>     Documentation filed: Orders, Patient Instructions Section

## 2012-06-02 NOTE — Progress Notes (Signed)
Ultrasound shows:  Gestational age by [place]     SIUP  S=D     [place] EDD: 11/03/2012            AFI: n/a            Cervical length: 3.26 cm cm            Placenta localization: posterior, low lying           Fetal presentation: breech       Anatomy survey is normal    Anatomy complete?yes            Gender : female Comments: breech presentation, posterior low lying placenta is 1.8 cm from internal os). Fluid is normal (vertical pocket = 3.4 cm. No anomalies seen (profile, palate, philtrum, nasal bone, open hands, 5th digit, feet, heel seen. Cardiac anatomy is unseen due to maternal body habitus and fetal position. Suggest follow up in  Weeks to complete anatomy and evaluate low lying placenta. Female gender. Bilateral simple ovarian cyst still seen, no fluid is CDS, normal adnexa's.  Pelvic rest precautions given.  Pt and husband want U/S prior to 24 wks to determine need to continue restrictions on intercourse.  Have scheduled for 3 wks, but may have MFM visit prior to that.  We had a prolonged discussion about the complex clinical issues on her problem list,  and went over the various important aspects to consider. All questions were answered. Pt will be transferred to HR clinic.  Will also have MFM consult to answer question concerning best location for delivery.  Increased risk of pre-eclampsia given hx in prior pregnancy and chronic hypertension reviewed. Pt has cardiology followup today.

## 2012-06-02 NOTE — Assessment & Plan Note (Addendum)
Doing well from this standpoint. No clinical HF symptoms. Continue labetalol. Not candidate for ACE-I or ARB at this time due to pregnancy. Agree with high-risk clinic. Will repeat echo 4-6 weeks.

## 2012-06-02 NOTE — Assessment & Plan Note (Signed)
BP still up. Will increase labetalol to 400 bid. See her back in 2 weeks. i have asked her to bring in home BP cuff so that we can calibrate it. Keep following BPs at home.

## 2012-06-02 NOTE — Progress Notes (Signed)
[redacted]w[redacted]d Pt had U/S   Glucola given @9 :10 am   LAB DRAW @ 10:39am.  IF NL. Will repeat at 26 wks

## 2012-06-02 NOTE — Progress Notes (Signed)
PCP: Dr. Kellie Shropshire  OB:  Dr. Pennie Rushing   HPI:   [name] is a 36 year old morbidly obese woman with h/o chronic systolic heart failure due to nonischemic cardiomyopathy (previously 25% in 2007 but now with recovered EF) s/p ICD implantation, hypertension, and obesity.    Cardiac Cath 2007 -Findings consistent with nonischemic, mildly dilated cardiomyopathy. This is probably related to hypertension with hypertensive heart disease. Severe left ventricular systolic dysfunction, ejection fraction around 35% with moderate to marked global hypokinesis. Moderate pulmonary hypertension. Preserved cardiac output and cardiac index.  ECHO 04/27/2011 45-50%. 08/20/11 ECHO 55-60% 05/19/12 EF ~40%  She returns for follow up today.  She is ~ 5 months pregnant.  Her benicar, coreg, and crestor were stopped when she found out she was pregnant.  She was transitioned to labetolol and nifedipine - nifedipine stopped due to HAs. At last visit we added HCTZ 25.   Feels fine. Works 10hrs/day at [employer]. She denies dyspnea, orthopnea, or PND.  No chest pain.  No edema.     BP running about 145/90 on average. Saw Dr. Pennie Rushing and will be enrolled in high-risk OB clinic.    ROS: All systems negative except as listed in HPI, PMH and Problem List.  Past Medical History  Diagnosis Date  . Hypertension     currently on Labetalol 200mg  BID  . Infection     UTI;not frequent;last infection was in June  . Infection     Yeast;not frequent  . Asthma     Inhaler prn;triggered by seasonal changes mostly cold weathert;last asthma  attack years ago  . CHF (congestive heart failure) 01/2006    Has Pacemaker and Defibrillator;was on heart meds;no longer taking since pregancy  . Anemia     Iron supplements in the past  . Migraines     Can't take Procardia XL, causes migraines  . Varicose veins 1998    Developed during last pregnancy  . Other primary cardiomyopathies   . Atrial fibrillation   . Paroxysmal ventricular  tachycardia   . CHF (congestive heart failure)   . Hypertension   . Acute bronchitis and bronchiolitis     Current Outpatient Prescriptions  Medication Sig Dispense Refill  . cetirizine (ZYRTEC) 10 MG tablet Take 10 mg by mouth daily.        . Cholecalciferol (VITAMIN D) 2000 UNITS tablet Take 2,000 Units by mouth daily.        . Ciprofloxacin (CIPRO PO) Take by mouth as directed.      . fish oil-omega-3 fatty acids 1000 MG capsule Take 1 g by mouth daily.      . fluconazole (DIFLUCAN) 150 MG tablet Take 1 tablet (150 mg total) by mouth once.  1 tablet  1  . furosemide (LASIX) 80 MG tablet Take 1 tablet (80 mg total) by mouth daily as needed.  60 tablet  2  . hydrochlorothiazide (HYDRODIURIL) 25 MG tablet Take 1 tablet (25 mg total) by mouth daily.  30 tablet  6  . labetalol (NORMODYNE) 100 MG tablet Take 2 tablets (200 mg total) by mouth 2 (two) times daily.  120 tablet  6  . Magnesium 250 MG TABS Take 1 tablet by mouth daily.        . multivitamin (THERAGRAN) per tablet Take 1 tablet by mouth daily.        Marland Kitchen omega-3 fish oil (MAXEPA) 1000 MG CAPS capsule Take by mouth daily.        Marland Kitchen OVER THE COUNTER MEDICATION       .  potassium chloride SA (K-DUR,KLOR-CON) 20 MEQ tablet Take 1 tablet (20 mEq total) by mouth 2 (two) times daily.  30 tablet  6  . Prenat w/o A-FeCbn-DSS-FA-DHA (CITRANATAL 90 DHA PO) Take 1 tablet by mouth daily.      . ranitidine (ZANTAC) 150 MG capsule Take 150 mg by mouth daily.        . vitamin C (ASCORBIC ACID) 500 MG tablet Take 500 mg by mouth daily.           PHYSICAL EXAM: Filed Vitals:   06/02/12 1430  BP: 182/102  Pulse: 82  Weight: 230 lb 4 oz (104.441 kg)  SpO2: 100%    General:  Well appearing. No resp difficulty HEENT: normal Neck: supple. JVP 6-7 Carotids 2+ bilaterally; no bruits. No lymphadenopathy or thryomegaly appreciated. Cor: PMI normal. Regular rate & rhythm. No rubs, gallops or murmurs. Lungs: clear Abdomen: soft, nontender, distended  and obese. No hepatosplenomegaly. No bruits or masses. Good bowel sounds. Extremities: no cyanosis, clubbing, rash, trace edema Neuro: alert & orientedx3, cranial nerves grossly intact. Moves all 4 extremities w/o difficulty. Affect pleasant.       ASSESSMENT & PLAN:

## 2012-06-02 NOTE — Patient Instructions (Addendum)
Increase you Labetalol to 400mg  twice a day  Your physician recommends that you schedule a follow-up appointment in: 2 weeks (Be sure to bring you B/P cuff)

## 2012-06-03 ENCOUNTER — Telehealth: Payer: Self-pay

## 2012-06-03 NOTE — Telephone Encounter (Signed)
Tc to pt. Informed pt appt sched with MFM on 06/14/12 @ 9:00a. Pt voices understanding and agrees.

## 2012-06-06 LAB — US OB COMP + 14 WK

## 2012-06-14 ENCOUNTER — Telehealth: Payer: Self-pay

## 2012-06-14 ENCOUNTER — Ambulatory Visit (HOSPITAL_COMMUNITY)
Admission: RE | Admit: 2012-06-14 | Discharge: 2012-06-14 | Disposition: A | Discharge status: Home / Self Care | Payer: BC Managed Care – PPO | Source: Ambulatory Visit | Attending: Obstetrics and Gynecology | Admitting: Obstetrics and Gynecology

## 2012-06-14 DIAGNOSIS — I2510 Atherosclerotic heart disease of native coronary artery without angina pectoris: Secondary | ICD-10-CM | POA: Insufficient documentation

## 2012-06-14 DIAGNOSIS — I499 Cardiac arrhythmia, unspecified: Secondary | ICD-10-CM | POA: Insufficient documentation

## 2012-06-14 DIAGNOSIS — O10019 Pre-existing essential hypertension complicating pregnancy, unspecified trimester: Secondary | ICD-10-CM | POA: Insufficient documentation

## 2012-06-14 DIAGNOSIS — O99891 Other specified diseases and conditions complicating pregnancy: Secondary | ICD-10-CM | POA: Insufficient documentation

## 2012-06-14 DIAGNOSIS — O169 Unspecified maternal hypertension, unspecified trimester: Secondary | ICD-10-CM

## 2012-06-14 DIAGNOSIS — Z79899 Other long term (current) drug therapy: Secondary | ICD-10-CM | POA: Insufficient documentation

## 2012-06-14 DIAGNOSIS — O09519 Supervision of elderly primigravida, unspecified trimester: Secondary | ICD-10-CM | POA: Insufficient documentation

## 2012-06-14 DIAGNOSIS — I509 Heart failure, unspecified: Secondary | ICD-10-CM | POA: Insufficient documentation

## 2012-06-14 DIAGNOSIS — J45909 Unspecified asthma, uncomplicated: Secondary | ICD-10-CM | POA: Insufficient documentation

## 2012-06-14 NOTE — Progress Notes (Signed)
Patient : [name]; 36 y.o. MRN# [id number] [facility] Attending: Hal Morales, MD Consult Date: 06/14/2012 -  Consult for: history of congestive heart failure, maternal arrythmia history  HPI: [name] is a 36 y.o. G2P0101 at [redacted]w[redacted]d complicated by maternal history of congestive heart failure and arrhythmia.  Since becoming pregnant, patient reports that she can still exercise as she walks 1.5 miles about every day on the treadmill.  She has 2 pillow orthopnea (improved from 4 pillow orthopnea a few years ago).  She has not experienced any dyspnea on mild exertion, chest pain, palpitations, or excessive fatigue.  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  The patient desires TOLAC following counseling. A full discussion replete with my impressions and salient recommendations follow.  OB History:  OB History    Grav Para Term Preterm Abortions TAB SAB Ect Mult Living   2 1  1      1      1. 1998 CD at 49 1/[redacted] weeks gestational age in context of severe preeclampsia and uncontrollable severe HTN unresponsive to MgSO4 or IV antihypertensives.  female with birthweight of 2 lbs 14 oz.  Her daughter is now 48 yo and is healthy.  Allergy: Oxycodone-acetaminophen and Percocet Patient denies food, latex or environmental allergies.   Current Medications: Current outpatient prescriptions:cetirizine (ZYRTEC) 10 MG tablet, Take 10 mg by mouth daily.  , Disp: , Rfl: ;  Cholecalciferol (VITAMIN D) 2000 UNITS tablet, Take 2,000 Units by mouth daily.  , Disp: , Rfl: ;  fish oil-omega-3 fatty acids 1000 MG capsule, Take 1 g by mouth daily., Disp: , Rfl: ;  fluconazole (DIFLUCAN) 150 MG tablet, Take 1 tablet (150 mg total) by mouth once., Disp: 1 tablet, Rfl: 1 furosemide (LASIX) 80 MG tablet, Take 1 tablet (80 mg total) by mouth daily as needed., Disp: 60 tablet, Rfl: 2;  hydrochlorothiazide  (HYDRODIURIL) 25 MG tablet, Take 1 tablet (25 mg total) by mouth daily., Disp: 30 tablet, Rfl: 6;  labetalol (NORMODYNE) 200 MG tablet, Take 2 tablets (400 mg total) by mouth 2 (two) times daily., Disp: 120 tablet, Rfl: 6;  Magnesium 250 MG TABS, Take 1 tablet by mouth daily.  , Disp: , Rfl:  multivitamin (THERAGRAN) per tablet, Take 1 tablet by mouth daily.  , Disp: , Rfl: ;  omega-3 fish oil (MAXEPA) 1000 MG CAPS capsule, Take by mouth daily.  , Disp: , Rfl: ;  OVER THE COUNTER MEDICATION, , Disp: , Rfl: ;  potassium chloride SA (K-DUR,KLOR-CON) 20 MEQ tablet, Take 1 tablet (20 mEq total) by mouth 2 (two) times daily., Disp: 60 tablet, Rfl: 6 Prenat w/o A-FeCbn-DSS-FA-DHA (CITRANATAL 90 DHA PO), Take 1 tablet by mouth daily., Disp: , Rfl: ;  ranitidine (ZANTAC) 150 MG capsule, Take 150 mg by mouth daily.  , Disp: , Rfl: ;  vitamin C (ASCORBIC ACID) 500 MG tablet, Take 500 mg by mouth daily.  , Disp: , Rfl:    Past Medical History: Past Medical History  Diagnosis Date  . Hypertension     currently on Labetalol 200mg  BID  . Infection     UTI;not frequent;last infection was in June  . Infection     Yeast;not frequent  . Asthma     Inhaler prn;triggered by seasonal changes mostly cold weathert;last asthma  attack years ago  . CHF (congestive heart failure) 01/2006  Has Pacemaker and Defibrillator;was on heart meds;no longer taking since pregancy  . Anemia     Iron supplements in the past  . Migraines     Can't take Procardia XL, causes migraines  . Varicose veins 1998    Developed during last pregnancy  . Other primary cardiomyopathies   . Atrial fibrillation   . Paroxysmal ventricular tachycardia   . CHF (congestive heart failure)   . Hypertension   . Acute bronchitis and bronchiolitis     Past Surgical History: Past Surgical History  Procedure Date  . Cesarean section 1998    d/t preeclampsia  . Breast reduction surgery 03/05/1994    d/t back, shoulder, and neck pain (42 F)  .  Insert / replace / remove pacemaker  02/10/2006    Status post implantable cardioverter-defibrillator insertion  .Marland KitchenMarland KitchenMarland KitchenThe Medtronic Maximo VR, model 7232, single chamber  . Cardiac catheterization     EF 30-35%     Past OB/GYN History: OB History    Grav Para Term Preterm Abortions TAB SAB Ect Mult Living   2 1  1      1       Social History: History   Social History  . Marital Status: Legally Separated    Spouse Name: Mylee Falin    Number of Children: 1  . Years of Education: 15   Occupational History  . 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 [employer]   Social History Main Topics  . Smoking status: Never Smoker   . Smokeless tobacco: Never Used  . Alcohol Use: No  . Drug Use: No  . Sexually Active: Yes -- Female partner(s)    Birth Control/ Protection: Condom, Pill, None   Other Topics Concern  . Not on file   Social History Narrative   ** Merged History Encounter **     Family History: Family History  Problem Relation Age of Onset  . Sickle cell trait Daughter   . Other Father     Varicose veins  . Asthma Daughter   . Seizures Maternal Uncle   . Migraines Mother   . Migraines Daughter   . Migraines Cousin     Maternal  . Cancer Paternal Grandmother     Stomach  . Cancer Maternal Uncle     Prostate  . Cancer Maternal Uncle     Lung  . Ulcers Mother   . Lupus Maternal Aunt   . Hypertension Father   . Hypertension Paternal Aunt   . Other Daughter     blood transfusion @ birth;platelets were low  . Rheum arthritis Mother   . Diabetes Father     Controlled w/ diet and exercise  . Diabetes Maternal Aunt   . Diabetes Paternal Aunt     x 2  . Diabetes Paternal Uncle   . Hypertension    . Heart disease       Review of Systems: A comprehensive review of systems was obtained.  Since becoming pregnant, patient reports that she can still exercise as she walks 1.5 miles about every day on the treadmill.  She has 2 pillow orthopnea  (improved from 4 pillow orthopnea a few years ago).  She has not experienced any dyspnea on mild exertion, chest pain, palpitations, or excessive fatigue.  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   Physical Exam*: Gen:  Well-appearing but obese female  in NAD HEENT:  NCAT, No JVD or neck masses grossly Neuro:  Grossly intact without focal deficits Abd:  Gravid Extr:  No edema grossly *More detailed exam was deferrred as the purpose of this visit was consultative in nature with a focus on elicitation of pertinent medical and obstetrical variables to formulate impression and care plan for the remainder of the pregnancy.  Labs: Maternal echo with EF 40% in October  Impressions:  MARILOU BARNFIELD is a 36 y.o. G2P0101 at [redacted]w[redacted]d - with a pregnancy complicated by:  Patient Active Problem List  Diagnosis  . CARDIOMYOPATHY  . ATRIAL FIBRILLATION  . CHF  . ACUTE BRONCHITIS  . VENTRICULAR TACHYCARDIA, PAROXYSMAL  . Hypertension  . Chronic systolic heart failure  . Dizziness  . Cardiomyopathy, hypertensive  . BV (bacterial vaginosis)  . Pacemaker  . Cardiac defibrillator in situ  . Hypertension complicating pregnancy  . Anemia  . Marginal placenta previa  . Vaginal bleeding in pregnancy  . Bilateral ovarian cysts  . History of pre-eclampsia in prior pregnancy, currently pregnant     Discussion: By way of consultation, I spoke to your patient about the management of pregnancy in context of history of congestive heart failure, maternal arrhythmia, CHTN, history of preeclampsia, and previous cesarean section.  1. History of congestive heart failure: In review of the patient's records, she had a maternal echocardiogram in October demonstrating some appreciable decrease in EF from 60% (January 2013) to 40-45% (October 2013.  It is my practice and recommendation to assess maternal cardiac function serially every 4-6  in a patient like Ms. Inis Sizer that has demonstrated some mild yet appreciable decrease in cardiac function early in pregnancy.  I explained to her that I am concerned that she may experience further deterioration and that we will desire serial assessments both clinically by physical examination and by maternal echo.  Such assessments would facilitate an understanding of her function with EF during the peak of the physiologic changes of pregnancy.  If she experiences symptoms of increasing dyspnea, chest pain, dizziness, fatigue, palpitations and/or sudden onset of edema, I explained to her that I desired she present to [place] for urgent evaluation during which we would facilitate a concurrent assessment by cardiology.    2. Maternal arrhythmias are an assortment of conditions in which stress hormone release are best avoided, namely catecholamines, as they can trigger an unstable ventricular rhythm.  Oftentimes, arrhythmias can lead to syncopal episodes or even death in context of pain.  Because of the severity and associated mortality risk, these patients are generally advised to avoid strenuous activities like competitive sports.  Similarly, laboring and the associated pain response could trigger arrhythmia.  Large fluid shifts as with hemorrhage incurred during a cesarean delivery poses risk for triggering arrhythmia.  While not commonly encountered, this condition is best managed obstetrically by placement of epidural early in labor to block the pain response release of catecholamines.  The maternal cardiac status should be closely and meticulously followed in labor by implementing telemetry monitoring of maternal heart rate and rhythm, continuous pulse oximetry, and close matching of fluid status (I/O's with adequate matching of hydration and urine output monitored by way of foley catheter).  I recommend that the patient have an assessment by anesthesiology for placement of early epidural; this assessment in  my opinion is best done as a planning visit sometime in the third trimester.  To avoid excessive maternal expulsive forces, I recommended to your patient to "labor down"  to an appropriate fetal station for an assisted second stage vaginal delivery by either forceps or vacuum depending on practitioner preference.  Following delivery, I recommend continuing the telemetry for 24 hours and pulse oximetry for 12-24 hours.  After this initial period of observation is without onset of CHF or arrhythmia, I would then simply obtain a pulse oximetry measurement routinely with vital signs every 4 hours until criteria for discharge are met.  3. Chronic hypertension and history of Preeclampsia: I reviewed hypertension as a cause of uteroplacental insufficiency, with increased risk of IUGR, oligohydramnios, and stillbirth.  I told her that her hypertension also places her at increased risk for preeclampsia (which she has a history of), describing the triad of increased blood pressure, proteinuria, and abnormal edema.  Finally, I spoke about hypertension increasing the risk of placental abruption, especially in the setting of superimposed preeclampsia.  We talked about the medical treatment of hypertension in pregnancy.  I outlined the usual plan of management for hypertension in pregnancy.  She should have her blood pressure carefully followed, and her medications adjusted to keep her BP in the target range of around 130-140/80-90 mm/Hg.  While her medications of HCTZ and labetalol are Class B and C medications in pregnancy, respectively, and are both safe and appropriate for the control of her HTN in context of the CHF, there are risks.  Namely, beta-blocker use can be associated with fetal growth restriction.  Diuretic use can be associated with oligohydramnios. Therefore, I recommend interval ultrasounds for fetal biometry and AFI so as to plot the fetal growth and fluid.  Assuming that the fetal growth and AFI are normal  on a monthly basis, the use of antenatal testing (eg, NST, BPP) may remain reserved until 32 weeks for fetal well-being assessment.   I recommended  24-hour urine collection for total protein and creatinine clearance; this may be used as both a screen for underlying renal damage and as a baseline for later evaluation in the event of suspected superimposed pre-eclampsia.  If she has a two- to three-fold increase in proteinuria and has concurrent clinical signs and symptoms of preeclampsia, she should be managed as having preeclampsia.  4. VBAC/TOLAC:    We discussed the ACOG criteria for allowing trial of labor for patients with previous cesarean section. These include: one prior low-transverse cesarean delivery (or two prior low-transverse cesareans with a prior vaginal delivery), a clinically adequate pelvis, and no other uterine scars or previous rupture. Additionally, a physician must be immediately available throughout labor and capable of performing emergency cesarean section with anesthesia and other personnel available for emergency cesarean delivery.   I talked about the likelihood of success (vaginal delivery) and the risks of attempting trial of labor. Although the published success rates may range as high as 80% for trial of labor after a previous cesarean section, a number of individual factors modify that overall rate.  In her case, she had a cesarean due to inability to control her HTN with medication in context of severe preeclampsia at 28 weeks.  Making sure that your patient understood that it is only an estimation, I told her that her likelihood of success is probably in the range of around 65%, owing to (no history of labor dystocia but high maternal BMI). Conversely, I told her that there is a likelihood of at least 35% that she will require cesarean delivery, even if she attempts trial of labor under appropriate circumstances. Moreover, her risk of undergoing surgery in context  of maternal  heart disease greatly outweighs the risk of TOLAC due to larger fluid shifts with increased requirement for hydration, increased risk of hemorrhage, increased inherent blood loss, and increased risk for transfusion with a cesarean.  I did talk about the risks of attempting VBAC. I outlined the increased risk of maternal and neonatal morbidity that accompanies failed VBAC, including hemorrhage, infection, etc. I spoke also about the possibility of uterine rupture with previous cesarean section. The rate of symptomatic uterine rupture appears to be about 0.5% with spontaneous, unaugmented labor and one prior cesarean section. Although a number of factors associated with uterine rupture have been identified (prostaglandin cervical ripening, oxytocin use, etc.), I explained that there are no accurate predictors of uterine rupture and that the first sign of complication is usually an abnormal FHR pattern or fetal bradycardia.   Finally, I we spoke about the issue of inducing labor in the face of previous cesarean section. I told her that patients are at lowest risk of uterine rupture with spontaneous labor, but the use of prostaglandin for ripening agents for induction appears to increase the rate of rupture, substantially so for the induction protocols. I told your patient that most clinicians in tertiary centers consider prostaglandin ripening to be contraindicated in the presence of a previous cesarean section.  Nonetheless, use of Foley bulb for mechanical dilation with low dose pitocin would be the best choice for her in my opinion should she have an unfavorable Bishop score.  Moreover, timed delivery at term to facilitate placement of early epidural for the aforementioned reasons is key for this patient.  Then, once a favorable Bishop score (6 or better) is achieved, I would have anesthesia place the epidural prior to initiating the pitocin for induction.  This way, the catecholamine release in association  with labor pains may be most fully avoided and, in turn, minimizing risk with delivery.  Again, cesarean delivery should be reserved for the usual obstetrical reasons. Moreover, her risk of undergoing surgery in context of maternal heart disease greatly outweighs the risk of TOLAC due to larger fluid shifts with increased requirement for hydration, increased risk of hemorrhage, increased inherent blood loss, and increased risk for transfusion with a cesarean.  Although this was a lot of information for her to digest, your patient demonstrated adequate comprehension of my impressions, recommendations and the underlying rationale.  All of her questions were answered during the course of the consultation.   Summary of Recommendations for Maternal Surveillance: 1. I recommend assessing maternal EF in pregnancy by echocardiogram in the next 1-2 weeks and then about every 4-6 weeks throughout the remainder of pregnancy (or more often if clinically indicated). 2. Continue comanagement with cardiology for medical follow up at least monthly during the mid- and late trimesters to ensure adequate follow up of HTN and cardiac function in context of the physiologic changes germane to the late trimester of pregnancy. 3. Following the maternal echocardiogram obtained at around 30 weeks, I would consider and recommend antenatal consultation with anesthesiology for their service's planning of needed early epidural to facilitate safe labor.  I would immediately consult them at time of admission for labor/induction if indication arises. 4. Telemetry in labor and for 1st 24 hours postpartum. 5. Continuous pulse oximetry in labor and for 1st 12-24 hours postpartum.  Then q 4 hours with vitals postpartum. 6. Early epidural (ie, upon diagnosis of active labor or prior to starting pitocin for induction in context of a favorable cervix). 7. TOLAC  is recommended to reduce maternal risks.  If spontaneous labor does not ensue prior  to 39 weeks, I would strongly recommend an induction of labor around 39 weeks to help facilitate onset of labor under controlled conditions; ie, laboring at home increases risk for maternal mortality in context of maternal history of CHF and arrhythmia.  Of course, this recommendation of delaying delivery until 39 weeks (term) is contingent upon continued good control of maternal symptoms and cardiac rhythm/function and reassuring fetal growth pattern. 8. If cervical ripening is required, prostaglandins are best avoided in context of TOLAC.  However, ripening may be achieved with a foley bulb with low-dose pitocin until favorable Bishop score is achieved.  9. I recommend second-stage assisted vaginal delivery by either forceps or vacuum depending on practitioner preference.  Cesarean delivery should be reserved for usual obstetrical indications. 10. The patient was given a jug for 24 hour urine collection.  Baseline preeclampsia labs will be drawn at the next visit when she deposits her jug of urine.  Summary of Recommendations for Fetal Surveillance: Given the use of beta-blocker and diuretic, I would follow interval fetal growth every 4 weeks for the remainder of the pregnancy.  Regardless of whether growth and AFI remain normal for gestational age, use of antenatal testing (twice weekly NST and weekly AFI) should begin at around [redacted] weeks gestation.  I spent in excess of 80 minutes in review of medical records, evaluation, and education of your patient in consultation.  More than 50% of this time was spent in direct face-to-face counseling.  It was a pleasure seeing your patient today in consultation. I recommend transfer of care to [facility] with planning for delivery at [facility].  Appropriate requests for scheduling have been made. Thank you for allowing [place] the opportunity to contribute to the care of your patient.   Page with questions.  Merideth Abbey, MD, MS, FACOG Assistant  Professor, Maternal-Fetal Medicine

## 2012-06-14 NOTE — Addendum Note (Signed)
Encounter addended by: Durwin Nora, MD on: 06/14/2012  2:06 PM<BR>     Documentation filed: Notes Section

## 2012-06-14 NOTE — Telephone Encounter (Signed)
Tc from pt. Pt req 1 hour GTT. Told pt 1 hour GTT=108. Pt voices understanding.

## 2012-06-14 NOTE — Progress Notes (Addendum)
Patient : [name]; 36 y.o. MRN# [id number] [facility] Attending: Vanessa P Haygood, MD Consult Date: 06/14/2012 -  Consult for: history of congestive heart failure, maternal arrythmia history  HPI: [name] is a 36 y.o. G2P0101 at [redacted]w[redacted]d complicated by maternal history of congestive heart failure and arrhythmia.  Since becoming pregnant, patient reports that she can still exercise as she walks 1.5 miles about every day on the treadmill.  She has 2 pillow orthopnea (improved from 4 pillow orthopnea a few years ago).  She has not experienced any dyspnea on mild exertion, chest pain, palpitations, or excessive fatigue.  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  The patient desires TOLAC following counseling. A full discussion replete with my impressions and salient recommendations follow.  OB History:  OB History    Grav Para Term Preterm Abortions TAB SAB Ect Mult Living   2 1  1      1     1. 1998 CD at 28 1/[redacted] weeks gestational age in context of severe preeclampsia and uncontrollable severe HTN unresponsive to MgSO4 or IV antihypertensives.  female with birthweight of 2 lbs 14 oz.  Her daughter is now 15 yo and is healthy.  Allergy: Oxycodone-acetaminophen and Percocet Patient denies food, latex or environmental allergies.   Current Medications: Current outpatient prescriptions:cetirizine (ZYRTEC) 10 MG tablet, Take 10 mg by mouth daily.  , Disp: , Rfl: ;  Cholecalciferol (VITAMIN D) 2000 UNITS tablet, Take 2,000 Units by mouth daily.  , Disp: , Rfl: ;  fish oil-omega-3 fatty acids 1000 MG capsule, Take 1 g by mouth daily., Disp: , Rfl: ;  fluconazole (DIFLUCAN) 150 MG tablet, Take 1 tablet (150 mg total) by mouth once., Disp: 1 tablet, Rfl: 1 furosemide (LASIX) 80 MG tablet, Take 1 tablet (80 mg total) by mouth daily as needed., Disp: 60 tablet, Rfl: 2;  hydrochlorothiazide  (HYDRODIURIL) 25 MG tablet, Take 1 tablet (25 mg total) by mouth daily., Disp: 30 tablet, Rfl: 6;  labetalol (NORMODYNE) 200 MG tablet, Take 2 tablets (400 mg total) by mouth 2 (two) times daily., Disp: 120 tablet, Rfl: 6;  Magnesium 250 MG TABS, Take 1 tablet by mouth daily.  , Disp: , Rfl:  multivitamin (THERAGRAN) per tablet, Take 1 tablet by mouth daily.  , Disp: , Rfl: ;  omega-3 fish oil (MAXEPA) 1000 MG CAPS capsule, Take by mouth daily.  , Disp: , Rfl: ;  OVER THE COUNTER MEDICATION, , Disp: , Rfl: ;  potassium chloride SA (K-DUR,KLOR-CON) 20 MEQ tablet, Take 1 tablet (20 mEq total) by mouth 2 (two) times daily., Disp: 60 tablet, Rfl: 6 Prenat w/o A-FeCbn-DSS-FA-DHA (CITRANATAL 90 DHA PO), Take 1 tablet by mouth daily., Disp: , Rfl: ;  ranitidine (ZANTAC) 150 MG capsule, Take 150 mg by mouth daily.  , Disp: , Rfl: ;  vitamin C (ASCORBIC ACID) 500 MG tablet, Take 500 mg by mouth daily.  , Disp: , Rfl:    Past Medical History: Past Medical History  Diagnosis Date  . Hypertension     currently on Labetalol 200mg BID  . Infection     UTI;not frequent;last infection was in June  . Infection     Yeast;not frequent  . Asthma     Inhaler prn;triggered by seasonal changes mostly cold weathert;last asthma  attack years ago  . CHF (congestive heart failure) 01/2006      Has Pacemaker and Defibrillator;was on heart meds;no longer taking since pregancy  . Anemia     Iron supplements in the past  . Migraines     Can't take Procardia XL, causes migraines  . Varicose veins 1998    Developed during last pregnancy  . Other primary cardiomyopathies   . Atrial fibrillation   . Paroxysmal ventricular tachycardia   . CHF (congestive heart failure)   . Hypertension   . Acute bronchitis and bronchiolitis     Past Surgical History: Past Surgical History  Procedure Date  . Cesarean section 1998    d/t preeclampsia  . Breast reduction surgery 03/05/1994    d/t back, shoulder, and neck pain (42 F)  .  Insert / replace / remove pacemaker  02/10/2006    Status post implantable cardioverter-defibrillator insertion  ....The Medtronic Maximo VR, model 7232, single chamber  . Cardiac catheterization     EF 30-35%     Past OB/GYN History: OB History    Grav Para Term Preterm Abortions TAB SAB Ect Mult Living   2 1  1      1      Social History: History   Social History  . Marital Status: Legally Separated    Spouse Name: Harold Stoltz    Number of Children: 1  . Years of Education: 15   Occupational History  . 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 [employer]   Social History Main Topics  . Smoking status: Never Smoker   . Smokeless tobacco: Never Used  . Alcohol Use: No  . Drug Use: No  . Sexually Active: Yes -- Female partner(s)    Birth Control/ Protection: Condom, Pill, None   Other Topics Concern  . Not on file   Social History Narrative   ** Merged History Encounter **     Family History: Family History  Problem Relation Age of Onset  . Sickle cell trait Daughter   . Other Father     Varicose veins  . Asthma Daughter   . Seizures Maternal Uncle   . Migraines Mother   . Migraines Daughter   . Migraines Cousin     Maternal  . Cancer Paternal Grandmother     Stomach  . Cancer Maternal Uncle     Prostate  . Cancer Maternal Uncle     Lung  . Ulcers Mother   . Lupus Maternal Aunt   . Hypertension Father   . Hypertension Paternal Aunt   . Other Daughter     blood transfusion @ birth;platelets were low  . Rheum arthritis Mother   . Diabetes Father     Controlled w/ diet and exercise  . Diabetes Maternal Aunt   . Diabetes Paternal Aunt     x 2  . Diabetes Paternal Uncle   . Hypertension    . Heart disease       Review of Systems: A comprehensive review of systems was obtained.  Since becoming pregnant, patient reports that she can still exercise as she walks 1.5 miles about every day on the treadmill.  She has 2 pillow orthopnea  (improved from 4 pillow orthopnea a few years ago).  She has not experienced any dyspnea on mild exertion, chest pain, palpitations, or excessive fatigue.  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   Physical Exam*: Gen:  Well-appearing but obese female   in NAD HEENT:  NCAT, No JVD or neck masses grossly Neuro:  Grossly intact without focal deficits Abd:  Gravid Extr:  No edema grossly *More detailed exam was deferrred as the purpose of this visit was consultative in nature with a focus on elicitation of pertinent medical and obstetrical variables to formulate impression and care plan for the remainder of the pregnancy.  Labs: Maternal echo with EF 40% in October  Impressions:  Perlita L Vickrey is a 36 y.o. G2P0101 at [redacted]w[redacted]d - with a pregnancy complicated by:  Patient Active Problem List  Diagnosis  . CARDIOMYOPATHY  . ATRIAL FIBRILLATION  . CHF  . ACUTE BRONCHITIS  . VENTRICULAR TACHYCARDIA, PAROXYSMAL  . Hypertension  . Chronic systolic heart failure  . Dizziness  . Cardiomyopathy, hypertensive  . BV (bacterial vaginosis)  . Pacemaker  . Cardiac defibrillator in situ  . Hypertension complicating pregnancy  . Anemia  . Marginal placenta previa  . Vaginal bleeding in pregnancy  . Bilateral ovarian cysts  . History of pre-eclampsia in prior pregnancy, currently pregnant     Discussion: By way of consultation, I spoke to your patient about the management of pregnancy in context of history of congestive heart failure, maternal arrhythmia, CHTN, history of preeclampsia, and previous cesarean section.  1. History of congestive heart failure: In review of the patient's records, she had a maternal echocardiogram in October demonstrating some appreciable decrease in EF from 60% (January 2013) to 40-45% (October 2013.  It is my practice and recommendation to assess maternal cardiac function serially every 4-6  in a patient like Ms. Erilyn L Metzgar that has demonstrated some mild yet appreciable decrease in cardiac function early in pregnancy.  I explained to her that I am concerned that she may experience further deterioration and that we will desire serial assessments both clinically by physical examination and by maternal echo.  Such assessments would facilitate an understanding of her function with EF during the peak of the physiologic changes of pregnancy.  If she experiences symptoms of increasing dyspnea, chest pain, dizziness, fatigue, palpitations and/or sudden onset of edema, I explained to her that I desired she present to us for urgent evaluation during which we would facilitate a concurrent assessment by cardiology.    2. Maternal arrhythmias are an assortment of conditions in which stress hormone release are best avoided, namely catecholamines, as they can trigger an unstable ventricular rhythm.  Oftentimes, arrhythmias can lead to syncopal episodes or even death in context of pain.  Because of the severity and associated mortality risk, these patients are generally advised to avoid strenuous activities like competitive sports.  Similarly, laboring and the associated pain response could trigger arrhythmia.  Large fluid shifts as with hemorrhage incurred during a cesarean delivery poses risk for triggering arrhythmia.  While not commonly encountered, this condition is best managed obstetrically by placement of epidural early in labor to block the pain response release of catecholamines.  The maternal cardiac status should be closely and meticulously followed in labor by implementing telemetry monitoring of maternal heart rate and rhythm, continuous pulse oximetry, and close matching of fluid status (I/O's with adequate matching of hydration and urine output monitored by way of foley catheter).  I recommend that the patient have an assessment by anesthesiology for placement of early epidural; this assessment in  my opinion is best done as a planning visit sometime in the third trimester.  To avoid excessive maternal expulsive forces, I recommended to your patient to "labor down"   to an appropriate fetal station for an assisted second stage vaginal delivery by either forceps or vacuum depending on practitioner preference.  Following delivery, I recommend continuing the telemetry for 24 hours and pulse oximetry for 12-24 hours.  After this initial period of observation is without onset of CHF or arrhythmia, I would then simply obtain a pulse oximetry measurement routinely with vital signs every 4 hours until criteria for discharge are met.  3. Chronic hypertension and history of Preeclampsia: I reviewed hypertension as a cause of uteroplacental insufficiency, with increased risk of IUGR, oligohydramnios, and stillbirth.  I told her that her hypertension also places her at increased risk for preeclampsia (which she has a history of), describing the triad of increased blood pressure, proteinuria, and abnormal edema.  Finally, I spoke about hypertension increasing the risk of placental abruption, especially in the setting of superimposed preeclampsia.  We talked about the medical treatment of hypertension in pregnancy.  I outlined the usual plan of management for hypertension in pregnancy.  She should have her blood pressure carefully followed, and her medications adjusted to keep her BP in the target range of around 130-140/80-90 mm/Hg.  While her medications of HCTZ and labetalol are Class B and C medications in pregnancy, respectively, and are both safe and appropriate for the control of her HTN in context of the CHF, there are risks.  Namely, beta-blocker use can be associated with fetal growth restriction.  Diuretic use can be associated with oligohydramnios. Therefore, I recommend interval ultrasounds for fetal biometry and AFI so as to plot the fetal growth and fluid.  Assuming that the fetal growth and AFI are normal  on a monthly basis, the use of antenatal testing (eg, NST, BPP) may remain reserved until 32 weeks for fetal well-being assessment.   I recommended  24-hour urine collection for total protein and creatinine clearance; this may be used as both a screen for underlying renal damage and as a baseline for later evaluation in the event of suspected superimposed pre-eclampsia.  If she has a two- to three-fold increase in proteinuria and has concurrent clinical signs and symptoms of preeclampsia, she should be managed as having preeclampsia.  4. VBAC/TOLAC:    We discussed the ACOG criteria for allowing trial of labor for patients with previous cesarean section. These include: one prior low-transverse cesarean delivery (or two prior low-transverse cesareans with a prior vaginal delivery), a clinically adequate pelvis, and no other uterine scars or previous rupture. Additionally, a physician must be immediately available throughout labor and capable of performing emergency cesarean section with anesthesia and other personnel available for emergency cesarean delivery.   I talked about the likelihood of success (vaginal delivery) and the risks of attempting trial of labor. Although the published success rates may range as high as 80% for trial of labor after a previous cesarean section, a number of individual factors modify that overall rate.  In her case, she had a cesarean due to inability to control her HTN with medication in context of severe preeclampsia at 28 weeks.  Making sure that your patient understood that it is only an estimation, I told her that her likelihood of success is probably in the range of around 65%, owing to (no history of labor dystocia but high maternal BMI). Conversely, I told her that there is a likelihood of at least 35% that she will require cesarean delivery, even if she attempts trial of labor under appropriate circumstances. Moreover, her risk of undergoing surgery in context   of maternal  heart disease greatly outweighs the risk of TOLAC due to larger fluid shifts with increased requirement for hydration, increased risk of hemorrhage, increased inherent blood loss, and increased risk for transfusion with a cesarean.  I did talk about the risks of attempting VBAC. I outlined the increased risk of maternal and neonatal morbidity that accompanies failed VBAC, including hemorrhage, infection, etc. I spoke also about the possibility of uterine rupture with previous cesarean section. The rate of symptomatic uterine rupture appears to be about 0.5% with spontaneous, unaugmented labor and one prior cesarean section. Although a number of factors associated with uterine rupture have been identified (prostaglandin cervical ripening, oxytocin use, etc.), I explained that there are no accurate predictors of uterine rupture and that the first sign of complication is usually an abnormal FHR pattern or fetal bradycardia.   Finally, I we spoke about the issue of inducing labor in the face of previous cesarean section. I told her that patients are at lowest risk of uterine rupture with spontaneous labor, but the use of prostaglandin for ripening agents for induction appears to increase the rate of rupture, substantially so for the induction protocols. I told your patient that most clinicians in tertiary centers consider prostaglandin ripening to be contraindicated in the presence of a previous cesarean section.  Nonetheless, use of Foley bulb for mechanical dilation with low dose pitocin would be the best choice for her in my opinion should she have an unfavorable Bishop score.  Moreover, timed delivery at term to facilitate placement of early epidural for the aforementioned reasons is key for this patient.  Then, once a favorable Bishop score (6 or better) is achieved, I would have anesthesia place the epidural prior to initiating the pitocin for induction.  This way, the catecholamine release in association  with labor pains may be most fully avoided and, in turn, minimizing risk with delivery.  Again, cesarean delivery should be reserved for the usual obstetrical reasons. Moreover, her risk of undergoing surgery in context of maternal heart disease greatly outweighs the risk of TOLAC due to larger fluid shifts with increased requirement for hydration, increased risk of hemorrhage, increased inherent blood loss, and increased risk for transfusion with a cesarean.  Although this was a lot of information for her to digest, your patient demonstrated adequate comprehension of my impressions, recommendations and the underlying rationale.  All of her questions were answered during the course of the consultation.   Summary of Recommendations for Maternal Surveillance: 1. I recommend assessing maternal EF in pregnancy by echocardiogram in the next 1-2 weeks and then about every 4-6 weeks throughout the remainder of pregnancy (or more often if clinically indicated). 2. Continue comanagement with cardiology for medical follow up at least monthly during the mid- and late trimesters to ensure adequate follow up of HTN and cardiac function in context of the physiologic changes germane to the late trimester of pregnancy. 3. Following the maternal echocardiogram obtained at around 30 weeks, I would consider and recommend antenatal consultation with anesthesiology for their service's planning of needed early epidural to facilitate safe labor.  I would immediately consult them at time of admission for labor/induction if indication arises. 4. Telemetry in labor and for 1st 24 hours postpartum. 5. Continuous pulse oximetry in labor and for 1st 12-24 hours postpartum.  Then q 4 hours with vitals postpartum. 6. Early epidural (ie, upon diagnosis of active labor or prior to starting pitocin for induction in context of a favorable cervix). 7. TOLAC   is recommended to reduce maternal risks.  If spontaneous labor does not ensue prior  to 39 weeks, I would strongly recommend an induction of labor around 39 weeks to help facilitate onset of labor under controlled conditions; ie, laboring at home increases risk for maternal mortality in context of maternal history of CHF and arrhythmia.  Of course, this recommendation of delaying delivery until 39 weeks (term) is contingent upon continued good control of maternal symptoms and cardiac rhythm/function and reassuring fetal growth pattern. 8. If cervical ripening is required, prostaglandins are best avoided in context of TOLAC.  However, ripening may be achieved with a foley bulb with low-dose pitocin until favorable Bishop score is achieved.  9. I recommend second-stage assisted vaginal delivery by either forceps or vacuum depending on practitioner preference.  Cesarean delivery should be reserved for usual obstetrical indications. 10. The patient was given a jug for 24 hour urine collection.  Baseline preeclampsia labs will be drawn at the next visit when she deposits her jug of urine.  Summary of Recommendations for Fetal Surveillance: Given the use of beta-blocker and diuretic, I would follow interval fetal growth every 4 weeks for the remainder of the pregnancy.  Regardless of whether growth and AFI remain normal for gestational age, use of antenatal testing (twice weekly NST and weekly AFI) should begin at around [redacted] weeks gestation.  I spent in excess of 80 minutes in review of medical records, evaluation, and education of your patient in consultation.  More than 50% of this time was spent in direct face-to-face counseling.  It was a pleasure seeing your patient today in consultation. I recommend transfer of care to [facility] with planning for delivery at [facility].  Appropriate requests for scheduling have been made. Thank you for allowing us the opportunity to contribute to the care of your patient.   Page with questions.  J. M. Denney, MD, MS, FACOG Assistant  Professor, Maternal-Fetal Medicine   

## 2012-06-16 ENCOUNTER — Ambulatory Visit (HOSPITAL_COMMUNITY)
Admission: RE | Admit: 2012-06-16 | Discharge: 2012-06-16 | Disposition: A | Discharge status: Home / Self Care | Payer: 59 | Source: Ambulatory Visit | Attending: Internal Medicine | Admitting: Internal Medicine

## 2012-06-16 ENCOUNTER — Ambulatory Visit (INDEPENDENT_AMBULATORY_CARE_PROVIDER_SITE_OTHER): Payer: 59 | Admitting: Obstetrics and Gynecology

## 2012-06-16 ENCOUNTER — Encounter (HOSPITAL_COMMUNITY): Payer: Self-pay

## 2012-06-16 ENCOUNTER — Encounter: Payer: Self-pay | Admitting: Obstetrics and Gynecology

## 2012-06-16 VITALS — BP 162/104 | HR 80 | Wt 233.8 lb

## 2012-06-16 VITALS — BP 157/94 | Temp 97.2°F | Wt 232.6 lb

## 2012-06-16 DIAGNOSIS — O4410 Placenta previa with hemorrhage, unspecified trimester: Secondary | ICD-10-CM

## 2012-06-16 DIAGNOSIS — O4450 Low lying placenta with hemorrhage, unspecified trimester: Secondary | ICD-10-CM

## 2012-06-16 DIAGNOSIS — I5022 Chronic systolic (congestive) heart failure: Secondary | ICD-10-CM | POA: Insufficient documentation

## 2012-06-16 DIAGNOSIS — I119 Hypertensive heart disease without heart failure: Secondary | ICD-10-CM

## 2012-06-16 DIAGNOSIS — Z23 Encounter for immunization: Secondary | ICD-10-CM

## 2012-06-16 DIAGNOSIS — O169 Unspecified maternal hypertension, unspecified trimester: Secondary | ICD-10-CM

## 2012-06-16 DIAGNOSIS — I10 Essential (primary) hypertension: Secondary | ICD-10-CM | POA: Insufficient documentation

## 2012-06-16 DIAGNOSIS — O34219 Maternal care for unspecified type scar from previous cesarean delivery: Secondary | ICD-10-CM

## 2012-06-16 DIAGNOSIS — I428 Other cardiomyopathies: Secondary | ICD-10-CM

## 2012-06-16 DIAGNOSIS — O0990 Supervision of high risk pregnancy, unspecified, unspecified trimester: Secondary | ICD-10-CM | POA: Insufficient documentation

## 2012-06-16 DIAGNOSIS — Z302 Encounter for sterilization: Secondary | ICD-10-CM

## 2012-06-16 LAB — POCT URINALYSIS DIP (DEVICE)
Bilirubin Urine: NEGATIVE
Nitrite: NEGATIVE
pH: 6 (ref 5.0–8.0)

## 2012-06-16 MED ORDER — INFLUENZA VIRUS VACC SPLIT PF IM SUSP
0.5000 mL | INTRAMUSCULAR | Status: AC
  Administered 2012-06-16: 0.5 mL via INTRAMUSCULAR

## 2012-06-16 MED ORDER — LABETALOL HCL 200 MG PO TABS: 400.0000 mg | ORAL_TABLET | Freq: Three times a day (TID) | ORAL | Status: DC

## 2012-06-16 NOTE — Addendum Note (Signed)
Encounter addended by: Ty Hilts, RN on: 06/16/2012 10:17 AM<BR>     Documentation filed: Charges VN

## 2012-06-16 NOTE — Progress Notes (Signed)
P=75, Here for first visit , transferred from [facility] , states saw Dr. Teressa Lower, cardiologist at [facility] today and bp there was 162/104. Given new patient information. Desires flu shot today.

## 2012-06-16 NOTE — Progress Notes (Signed)
PCP: Dr. Kellie Shropshire  OB:  Dr. Pennie Rushing   HPI:   [name] is a 36 year old morbidly obese woman with h/o chronic systolic heart failure due to nonischemic cardiomyopathy (previously 25% in 2007 but now with recovered EF) s/p ICD implantation, hypertension, and obesity.    Cardiac Cath 2007 -Findings consistent with nonischemic, mildly dilated cardiomyopathy. This is probably related to hypertension with hypertensive heart disease. Severe left ventricular systolic dysfunction, ejection fraction around 35% with moderate to marked global hypokinesis. Moderate pulmonary hypertension. Preserved cardiac output and cardiac index.  ECHO 04/27/2011 45-50%. 08/20/11 ECHO 55-60% 05/19/12 EF ~40%  She returns for 2 week follow up today for BP check.  SBP running in the 140-170s at home.  Last night 177/97.  Have asked her to bring her blood pressure cuff today and it was 165/100 which is close to our reading of 162/104.  She is compliant with her meds.  She is scheduled to see Dr. Catalina Antigua in the high risk OB clinic today.  She is unable to tolerate nifedipine due to HAs/migraines.  She continues to work at [employer].  She denies dyspnea, orthopnea, or PND.  No chest pain.  No edema.     ROS: All systems negative except as listed in HPI, PMH and Problem List.  Past Medical History  Diagnosis Date  . Hypertension     currently on Labetalol 200mg  BID  . Infection     UTI;not frequent;last infection was in June  . Infection     Yeast;not frequent  . Asthma     Inhaler prn;triggered by seasonal changes mostly cold weathert;last asthma  attack years ago  . CHF (congestive heart failure) 01/2006    Has Pacemaker and Defibrillator;was on heart meds;no longer taking since pregancy  . Anemia     Iron supplements in the past  . Migraines     Can't take Procardia XL, causes migraines  . Varicose veins 1998    Developed during last pregnancy  . Other primary cardiomyopathies   . Atrial fibrillation   .  Paroxysmal ventricular tachycardia   . CHF (congestive heart failure)   . Hypertension   . Acute bronchitis and bronchiolitis     Current Outpatient Prescriptions  Medication Sig Dispense Refill  . cetirizine (ZYRTEC) 10 MG tablet Take 10 mg by mouth daily.        . Cholecalciferol (VITAMIN D) 2000 UNITS tablet Take 2,000 Units by mouth daily.        . fish oil-omega-3 fatty acids 1000 MG capsule Take 1 g by mouth daily.      . fluconazole (DIFLUCAN) 150 MG tablet Take 1 tablet (150 mg total) by mouth once.  1 tablet  1  . furosemide (LASIX) 80 MG tablet Take 1 tablet (80 mg total) by mouth daily as needed.  60 tablet  2  . hydrochlorothiazide (HYDRODIURIL) 25 MG tablet Take 1 tablet (25 mg total) by mouth daily.  30 tablet  6  . labetalol (NORMODYNE) 200 MG tablet Take 2 tablets (400 mg total) by mouth 2 (two) times daily.  120 tablet  6  . Magnesium 250 MG TABS Take 1 tablet by mouth daily.        . multivitamin (THERAGRAN) per tablet Take 1 tablet by mouth daily.        Marland Kitchen omega-3 fish oil (MAXEPA) 1000 MG CAPS capsule Take by mouth daily.        Marland Kitchen OVER THE COUNTER MEDICATION       .  potassium chloride SA (K-DUR,KLOR-CON) 20 MEQ tablet Take 1 tablet (20 mEq total) by mouth 2 (two) times daily.  60 tablet  6  . Prenat w/o A-FeCbn-DSS-FA-DHA (CITRANATAL 90 DHA PO) Take 1 tablet by mouth daily.      . ranitidine (ZANTAC) 150 MG capsule Take 150 mg by mouth daily.        . vitamin C (ASCORBIC ACID) 500 MG tablet Take 500 mg by mouth daily.           PHYSICAL EXAM: Filed Vitals:   06/16/12 0854  BP: 162/104  Pulse: 80  Weight: 233 lb 12.8 oz (106.051 kg)  SpO2: 100%    General:  Well appearing. No resp difficulty HEENT: normal Neck: supple. JVP 6-7 Carotids 2+ bilaterally; no bruits. No lymphadenopathy or thryomegaly appreciated. Cor: PMI normal. Regular rate & rhythm. No rubs, gallops or murmurs. Lungs: clear Abdomen: soft, nontender, distended and obese. No hepatosplenomegaly.  No bruits or masses. Good bowel sounds. Extremities: no cyanosis, clubbing, rash, trace edema Neuro: alert & orientedx3, cranial nerves grossly intact. Moves all 4 extremities w/o difficulty. Affect pleasant.    ASSESSMENT & PLAN:

## 2012-06-16 NOTE — Addendum Note (Signed)
Addended by: Sherre Lain A on: 06/16/2012 10:52 AM   Modules accepted: Orders

## 2012-06-16 NOTE — Assessment & Plan Note (Signed)
Her blood pressure remains uncontrolled.  Have discussed options with Dr. Gala Romney and pharmacy, will increase labetolol 400 mg TID as she is unable to tolerate nifidipine due to migraines.  Will have her to check BP twice daily and bring recordings to next visit.  If BP remains elevated will add alpha methyldopa at follow up.  Will repeat echo every 4-6 weeks during high risk pregnancy.  Will repeat in 2 weeks.

## 2012-06-16 NOTE — Assessment & Plan Note (Signed)
Volume status well controlled.  Will continue HCTZ.  Follow up 2 weeks with repeat echo.  Will obtain serial echos every 4-6 weeks while pregnant.

## 2012-06-16 NOTE — Patient Instructions (Addendum)
Increase labetalol 400 mg three times daily.  Follow up in 2 weeks with echo.  Your physician has requested that you have an echocardiogram. Echocardiography is a painless test that uses sound waves to create images of your heart. It provides your doctor with information about the size and shape of your heart and how well your heart's chambers and valves are working. This procedure takes approximately one hour. There are no restrictions for this procedure.

## 2012-06-16 NOTE — Progress Notes (Signed)
Patient transferred from [facility] secondary to high risk status. Was seen by MFM recently who recommended planned delivery at [facility]. Patient scheduled for repeat maternal echo ion 12/6 with cardiologist. Will scheduled for repeat ultrasound secondary to incomplete anatomy with MFM on 11/29. Patient was previously instructed to bring baseline 24 hour urine and have labs collected secondary to [facility] at her MFM appointment on 11/29. Labetalol was increased to 400 TID this am by cardiologist.  Patient understands that she will continue her care with [place] but will deliver at [facility].

## 2012-06-16 NOTE — Progress Notes (Signed)
Nutrition Note: 1st visit consult Pt with h/o heart failure, high BP & obesity.  Pt has gained 5.6# @ [redacted]w[redacted]d, which is wnl. Pt reports eating 3 meals & 3 snacks/ d. Pt reports drinking water, juice & milk daily and tea occ. Pt reports taking PNV. Pt reports no N&V but is constipated occ.  Pt reports walking most days. Provided pt with verbal & written education on general nutrition during pregnancy. Disc importance of BF. Disc wt gain goals of 11-20# or 0.5#/wk. Pt agrees to cont PNV. Pt does not receive WIC but plans to apply.  Pt plans to BF. F/u if referred Blondell Reveal, MS, RD, LDN

## 2012-06-17 ENCOUNTER — Other Ambulatory Visit: Payer: Self-pay

## 2012-06-17 DIAGNOSIS — O4440 Low lying placenta NOS or without hemorrhage, unspecified trimester: Secondary | ICD-10-CM

## 2012-06-17 DIAGNOSIS — Z3689 Encounter for other specified antenatal screening: Secondary | ICD-10-CM

## 2012-06-20 ENCOUNTER — Other Ambulatory Visit: Payer: 59

## 2012-06-20 ENCOUNTER — Encounter: Payer: 59 | Admitting: Obstetrics and Gynecology

## 2012-06-21 ENCOUNTER — Other Ambulatory Visit (HOSPITAL_COMMUNITY): Payer: Self-pay | Admitting: Obstetrics and Gynecology

## 2012-06-21 DIAGNOSIS — I509 Heart failure, unspecified: Secondary | ICD-10-CM

## 2012-06-24 ENCOUNTER — Ambulatory Visit (HOSPITAL_COMMUNITY)
Admission: RE | Admit: 2012-06-24 | Discharge: 2012-06-24 | Disposition: A | Discharge status: Home / Self Care | Payer: 59 | Source: Ambulatory Visit | Attending: Obstetrics and Gynecology | Admitting: Obstetrics and Gynecology

## 2012-06-24 VITALS — BP 152/89 | HR 73 | Wt 235.5 lb

## 2012-06-24 DIAGNOSIS — N83202 Unspecified ovarian cyst, left side: Secondary | ICD-10-CM

## 2012-06-24 DIAGNOSIS — O34219 Maternal care for unspecified type scar from previous cesarean delivery: Secondary | ICD-10-CM | POA: Insufficient documentation

## 2012-06-24 DIAGNOSIS — Z302 Encounter for sterilization: Secondary | ICD-10-CM

## 2012-06-24 DIAGNOSIS — O358XX0 Maternal care for other (suspected) fetal abnormality and damage, not applicable or unspecified: Secondary | ICD-10-CM | POA: Insufficient documentation

## 2012-06-24 DIAGNOSIS — O4450 Low lying placenta with hemorrhage, unspecified trimester: Secondary | ICD-10-CM

## 2012-06-24 DIAGNOSIS — Z363 Encounter for antenatal screening for malformations: Secondary | ICD-10-CM | POA: Insufficient documentation

## 2012-06-24 DIAGNOSIS — Z1389 Encounter for screening for other disorder: Secondary | ICD-10-CM | POA: Insufficient documentation

## 2012-06-24 DIAGNOSIS — O4690 Antepartum hemorrhage, unspecified, unspecified trimester: Secondary | ICD-10-CM

## 2012-06-24 DIAGNOSIS — O0990 Supervision of high risk pregnancy, unspecified, unspecified trimester: Secondary | ICD-10-CM

## 2012-06-24 DIAGNOSIS — I509 Heart failure, unspecified: Secondary | ICD-10-CM

## 2012-06-24 DIAGNOSIS — O4420 Partial placenta previa NOS or without hemorrhage, unspecified trimester: Secondary | ICD-10-CM

## 2012-06-24 DIAGNOSIS — O10019 Pre-existing essential hypertension complicating pregnancy, unspecified trimester: Secondary | ICD-10-CM | POA: Insufficient documentation

## 2012-06-24 LAB — CREATININE CLEARANCE, URINE, 24 HOUR
Creatinine Clearance: 91 mL/min (ref 75–115)
Creatinine, 24H Ur: 1534 mg/d (ref 700–1800)
Creatinine, Urine: 105.81 mg/dL
Creatinine: 1.17 mg/dL — ABNORMAL HIGH (ref 0.50–1.10)

## 2012-06-24 LAB — CREATININE, SERUM: Creatinine, Ser: 1.06 mg/dL (ref 0.50–1.10)

## 2012-06-24 NOTE — Progress Notes (Signed)
[name] had an ultrasound appointment today.  Please see [facility]-OB/GYN report for details.   Impression An active singleton fetus is observed in gestation complicated by maternal congestive heart failure Biometry is appropriate for gestational age. Screening survey of the fetal anatomy was performed and no dysmorphic features are detected, noting that images of the spine and heart are suboptimal and warrant follow up. Amniotic fluid volume is normal.  Incidental note is made of small, simple ovarian cysts on both ovaries.  Recommendations Follow up ultrasound for interval growth and completion of the anatomic survey is scheduled to occur in 3 weeks. Patient's prenatal visits are being transferred to the [facility] with the next appointment on June 29, 2012.  Rogelia Boga, MD, MS, FACOG Assistant Professor Section of Maternal-Fetal Medicine [facility]

## 2012-06-25 NOTE — Addendum Note (Signed)
Encounter addended by: Dolores Patty, MD on: 06/25/2012  9:36 PM<BR>     Documentation filed: Follow-up Section, LOS Section

## 2012-06-27 ENCOUNTER — Telehealth: Payer: Self-pay | Admitting: *Deleted

## 2012-06-27 NOTE — Telephone Encounter (Signed)
Pt left message stating that she had a pain behind her navel last night when she stood up and wants to know if this is normal.  I returned pt's call and discussed her concern. She is not having the pain now and states that it just went on for about 30 min. last night.  Based on her description, I told pt that I did not think there was anything to worry about and that most likely it was just muscle strain or the way the baby was laying. I advised pt to come to [facility] if her sx worsened or became more consistent.  Pt voiced understanding. We also discussed her plan for prenatal care. I stated that I had read in Dr. Reeves Forth note that she was going to transfer prenatal care to [facility] and that she has appt on 06/29/12. She also has follow up Ob appt in our clinic on 12/12.  Pt stated that she is also confused. I advised that she call the MFM dept tomorrow and discuss with MD @ that time.  Pt agreed and voiced understanding.

## 2012-07-01 ENCOUNTER — Ambulatory Visit (HOSPITAL_COMMUNITY)
Admission: RE | Admit: 2012-07-01 | Discharge: 2012-07-01 | Disposition: A | Discharge status: Home / Self Care | Payer: 59 | Source: Ambulatory Visit | Attending: Internal Medicine | Admitting: Internal Medicine

## 2012-07-01 ENCOUNTER — Other Ambulatory Visit (HOSPITAL_COMMUNITY): Payer: Self-pay | Admitting: Vascular Surgery

## 2012-07-01 ENCOUNTER — Ambulatory Visit (HOSPITAL_BASED_OUTPATIENT_CLINIC_OR_DEPARTMENT_OTHER)
Admission: RE | Admit: 2012-07-01 | Discharge: 2012-07-01 | Disposition: A | Discharge status: Home / Self Care | Payer: 59 | Source: Ambulatory Visit | Attending: Internal Medicine | Admitting: Internal Medicine

## 2012-07-01 VITALS — BP 172/94 | HR 75 | Wt 232.5 lb

## 2012-07-01 DIAGNOSIS — I119 Hypertensive heart disease without heart failure: Secondary | ICD-10-CM

## 2012-07-01 DIAGNOSIS — I517 Cardiomegaly: Secondary | ICD-10-CM | POA: Insufficient documentation

## 2012-07-01 DIAGNOSIS — I5022 Chronic systolic (congestive) heart failure: Secondary | ICD-10-CM

## 2012-07-01 DIAGNOSIS — O169 Unspecified maternal hypertension, unspecified trimester: Secondary | ICD-10-CM

## 2012-07-01 DIAGNOSIS — I428 Other cardiomyopathies: Secondary | ICD-10-CM

## 2012-07-01 DIAGNOSIS — I10 Essential (primary) hypertension: Secondary | ICD-10-CM | POA: Insufficient documentation

## 2012-07-01 MED ORDER — METHYLDOPA 250 MG PO TABS: 250.0000 mg | ORAL_TABLET | Freq: Two times a day (BID) | ORAL | Status: DC

## 2012-07-01 NOTE — Patient Instructions (Addendum)
Add methyldopa 250 mg twice daily.  Ok to start taking Aspirin 81 mg daily.  May take extra hydrochlorothiazide if increased swelling. (add extra potassium that day)  Follow up 1 month with echo.

## 2012-07-01 NOTE — Assessment & Plan Note (Addendum)
Chronic systolic heart failure in setting of hypertensive cardiomyopathy.  BP remains elevated.  Will add methyldopa 250 mg BID.  She has trace ankle edema at this time, have discussed sliding scale HCTZ.  Will continue to follow echos every month as she is high-risk.  Continue to follow in High Risk OB Clinic.            Patient seen and examined with Ulyess Blossom, PA-C. We discussed all aspects of the encounter. I agree with the assessment and plan as stated above.  Agree with above. Echo reviewed personally and compared to previous. EF stable in 35-40% range. Mild volume overload but may be normal for pregnancy. Can take extra HCTZ if getting worse. Continue monthly echos. Add methyldopa for HTN - dosing d/w pharmacy.

## 2012-07-01 NOTE — Progress Notes (Signed)
*  PRELIMINARY RESULTS* Echocardiogram 2D Echocardiogram has been performed.  [name] 07/01/2012, 10:05 AM

## 2012-07-01 NOTE — Progress Notes (Signed)
PCP: Dr. Kellie Shropshire  OB:  Dr. Pennie Rushing   HPI:  [name] is a 36 year old morbidly obese woman with h/o chronic systolic heart failure due to nonischemic cardiomyopathy (previously 25% in 2007 but now with recovered EF) s/p ICD implantation, hypertension, and obesity.    Cardiac Cath 2007 -Findings consistent with nonischemic, mildly dilated cardiomyopathy. This is probably related to hypertension with hypertensive heart disease. Severe left ventricular systolic dysfunction, ejection fraction around 35% with moderate to marked global hypokinesis. Moderate pulmonary hypertension. Preserved cardiac output and cardiac index.  ECHO 04/27/2011 45-50%. 08/20/11 ECHO 55-60% 05/19/12 EF ~40% 07/01/12 EF ~40%  She returns for follow up today.  She feels well.  She is 23 weeks today.  She is now seeing Dr. Vincenza Hews in [place] every 2 weeks now.  He would like her to start ASA.  SBP 150s/80s at home.  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  No edema.  No orthopnea/PND.  Denies dyspnea.     ROS: All systems negative except as listed in HPI, PMH and Problem List.  Past Medical History  Diagnosis Date  . Hypertension     currently on Labetalol 200mg  BID  . Infection     UTI;not frequent;last infection was in June  . Infection     Yeast;not frequent  . Asthma     Inhaler prn;triggered by seasonal changes mostly cold weathert;last asthma  attack years ago  . CHF (congestive heart failure) 01/2006    Has Pacemaker and Defibrillator;was on heart meds;no longer taking since pregancy  . Anemia     Iron supplements in the past  . Migraines     Can't take Procardia XL, causes migraines  . Varicose veins 1998    Developed during last pregnancy  . Other primary cardiomyopathies   . Atrial fibrillation   . Paroxysmal ventricular tachycardia   . CHF (congestive heart failure)   . Hypertension   . Acute bronchitis and bronchiolitis     Current Outpatient Prescriptions  Medication Sig Dispense Refill  .  Cholecalciferol (VITAMIN D) 2000 UNITS tablet Take 2,000 Units by mouth daily.        . hydrochlorothiazide (HYDRODIURIL) 25 MG tablet Take 1 tablet (25 mg total) by mouth daily.  30 tablet  6  . labetalol (NORMODYNE) 200 MG tablet Take 2 tablets (400 mg total) by mouth 3 (three) times daily.  180 tablet  3  . Magnesium 250 MG TABS Take 1 tablet by mouth daily.        Marland Kitchen omega-3 fish oil (MAXEPA) 1000 MG CAPS capsule Take by mouth daily.        Marland Kitchen OVER THE COUNTER MEDICATION       . potassium chloride SA (K-DUR,KLOR-CON) 20 MEQ tablet Take 1 tablet (20 mEq total) by mouth 2 (two) times daily.  60 tablet  6  . Prenat w/o A-FeCbn-DSS-FA-DHA (CITRANATAL 90 DHA PO) Take 1 tablet by mouth daily.      . ranitidine (ZANTAC) 150 MG capsule Take 150 mg by mouth daily.        . vitamin C (ASCORBIC ACID) 500 MG tablet Take 500 mg by mouth daily.           PHYSICAL EXAM: Filed Vitals:   07/01/12 1101  BP: 172/94  Pulse: 75  Weight: 232 lb 8 oz (105.461 kg)  SpO2: 100%    General:  Well appearing. No resp difficulty HEENT: normal Neck: supple. JVP 6-7 Carotids 2+ bilaterally; no bruits. No lymphadenopathy or thryomegaly  appreciated. Cor: PMI normal. Regular rate & rhythm. No rubs, gallops or murmurs. Lungs: clear Abdomen: soft, nontender, distended and obese. No hepatosplenomegaly. No bruits or masses. Good bowel sounds. Extremities: no cyanosis, clubbing, rash, trace edema Neuro: alert & orientedx3, cranial nerves grossly intact. Moves all 4 extremities w/o difficulty. Affect pleasant.    ASSESSMENT & PLAN:

## 2012-07-01 NOTE — Assessment & Plan Note (Addendum)
Uncontrolled.  Add methyldopa 250 mg BID.  Continue to follow BP closely at home.    Attending: Agree.

## 2012-07-07 ENCOUNTER — Encounter: Payer: 59 | Admitting: Obstetrics & Gynecology

## 2012-07-15 ENCOUNTER — Ambulatory Visit (HOSPITAL_COMMUNITY): Payer: 59

## 2012-08-03 ENCOUNTER — Encounter (HOSPITAL_COMMUNITY): Payer: Self-pay

## 2012-08-03 ENCOUNTER — Encounter (HOSPITAL_COMMUNITY): Payer: Self-pay | Admitting: *Deleted

## 2012-08-03 ENCOUNTER — Ambulatory Visit (HOSPITAL_COMMUNITY)
Admission: RE | Admit: 2012-08-03 | Discharge: 2012-08-03 | Disposition: A | Discharge status: Home / Self Care | Payer: 59 | Source: Ambulatory Visit | Attending: Internal Medicine | Admitting: Internal Medicine

## 2012-08-03 ENCOUNTER — Ambulatory Visit (HOSPITAL_BASED_OUTPATIENT_CLINIC_OR_DEPARTMENT_OTHER)
Admission: RE | Admit: 2012-08-03 | Discharge: 2012-08-03 | Disposition: A | Discharge status: Home / Self Care | Payer: 59 | Source: Ambulatory Visit | Attending: Internal Medicine | Admitting: Internal Medicine

## 2012-08-03 VITALS — BP 136/94 | HR 77 | Wt 240.8 lb

## 2012-08-03 DIAGNOSIS — I119 Hypertensive heart disease without heart failure: Secondary | ICD-10-CM

## 2012-08-03 DIAGNOSIS — I5022 Chronic systolic (congestive) heart failure: Secondary | ICD-10-CM

## 2012-08-03 DIAGNOSIS — I10 Essential (primary) hypertension: Secondary | ICD-10-CM

## 2012-08-03 DIAGNOSIS — I059 Rheumatic mitral valve disease, unspecified: Secondary | ICD-10-CM

## 2012-08-03 DIAGNOSIS — I079 Rheumatic tricuspid valve disease, unspecified: Secondary | ICD-10-CM | POA: Insufficient documentation

## 2012-08-03 DIAGNOSIS — I509 Heart failure, unspecified: Secondary | ICD-10-CM | POA: Insufficient documentation

## 2012-08-03 NOTE — Assessment & Plan Note (Addendum)
SBP < 130 at home. If SBP increased > 140. Will need to increase [place] to 500 mg bid.   Attending: Agree. BP slightly up here but very well controlled at home. Ill continue current regimen. If SBP creeps over 140 will increase Aldomet.

## 2012-08-03 NOTE — Assessment & Plan Note (Addendum)
Chronic systolic heart failure in setting of hypertensive cardiomyopathy. Dr Gala Romney discussed and reviewed ECHO during OV. EF remains ~40%. Continue current regimen. Will provide records to Dr Lendon Colonel. Provided copy of ECHO.. Follow up in HF clinic after delivery.   Patient seen and examined with Tonye Becket, NP. We discussed all aspects of the encounter. I agree with the assessment and plan as stated above. No clinical HF. Echo reviewed personally in clinic. EF very stable around 40%. Will continue current rx. Not candidate for ACE due to pregnancy. She will be transferring her care to Dr. Lendon Colonel at [place] to provide continuity with the MFM clinic. I have called him personally to discuss. We also have made a copy of her notes and a CD with her echo images to send along with her.

## 2012-08-03 NOTE — Progress Notes (Signed)
Patient ID: [name], female   DOB: [birth year], 37 y.o.   MRN: [id number] PCP: Dr. Kellie Shropshire  OB:  Dr. Pennie Rushing  Heart Failure: Dr Lendon Colonel High Risk Ob/Gyn: Dr Vincenza Hews  HPI:  [name] is a 37 year old morbidly obese woman with h/o chronic systolic heart failure due to nonischemic cardiomyopathy (previously 25% in 2007 but now with recovered EF) s/p ICD implantation, hypertension, and obesity.    Cardiac Cath 2007 -Findings consistent with nonischemic, mildly dilated cardiomyopathy. This is probably related to hypertension with hypertensive heart disease. Severe left ventricular systolic dysfunction, ejection fraction around 35% with moderate to marked global hypokinesis. Moderate pulmonary hypertension. Preserved cardiac output and cardiac index.  ECHO 04/27/2011 45-50%. 08/20/11 ECHO 55-60% 05/19/12 EF ~40% 07/01/12 EF ~40% 08/02/12 EF~40%  She returns for follow up today.  Complains of nasal congestion. Denies dyspnea/CP/orthopnea.   She is [redacted] weeks pregnant.  She is now seeing Dr. Vincenza Hews in [place] every 2 weeks.  SBP 124/68 at home.  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      ROS: All systems negative except as listed in HPI, PMH and Problem List.  Past Medical History  Diagnosis Date  . Hypertension     currently on Labetalol 200mg  BID  . Infection     UTI;not frequent;last infection was in June  . Infection     Yeast;not frequent  . Asthma     Inhaler prn;triggered by seasonal changes mostly cold weathert;last asthma  attack years ago  . CHF (congestive heart failure) 01/2006    Has Pacemaker and Defibrillator;was on heart meds;no longer taking since pregancy  . Anemia     Iron supplements in the past  . Migraines     Can't take Procardia XL, causes migraines  . Varicose veins 1998    Developed during last pregnancy  . Other primary cardiomyopathies   . Atrial fibrillation   . Paroxysmal ventricular tachycardia   . CHF (congestive heart  failure)   . Hypertension   . Acute bronchitis and bronchiolitis     Current Outpatient Prescriptions  Medication Sig Dispense Refill  . albuterol (PROVENTIL HFA;VENTOLIN HFA) 108 (90 BASE) MCG/ACT inhaler Inhale 2 puffs into the lungs every 6 (six) hours as needed.      Marland Kitchen aspirin 81 MG tablet Take 81 mg by mouth daily.      . Cholecalciferol (VITAMIN D) 2000 UNITS tablet Take 2,000 Units by mouth daily.        . hydrochlorothiazide (HYDRODIURIL) 25 MG tablet Take 1 tablet (25 mg total) by mouth daily.  30 tablet  6  . labetalol (NORMODYNE) 100 MG tablet Take 500 mg by mouth 3 (three) times daily.      . Magnesium 250 MG TABS Take 1 tablet by mouth daily.        . methyldopa (ALDOMET) 250 MG tablet Take 1 tablet (250 mg total) by mouth 2 (two) times daily.  60 tablet  3  . omega-3 fish oil (MAXEPA) 1000 MG CAPS capsule Take by mouth daily.        . potassium chloride SA (K-DUR,KLOR-CON) 20 MEQ tablet Take 1 tablet (20 mEq total) by mouth 2 (two) times daily.  60 tablet  6  . Prenat w/o A-FeCbn-DSS-FA-DHA (CITRANATAL 90 DHA PO) Take 1 tablet by mouth daily.      . vitamin C (ASCORBIC ACID) 500 MG tablet Take 500 mg by mouth daily.  PHYSICAL EXAM: Filed Vitals:   08/03/12 1016  BP: 136/94  Pulse: 77  Weight: 240 lb 12.8 oz (109.226 kg)  SpO2: 100%    General:  Well appearing. No resp difficulty HEENT: normal Neck: supple. JVP 6-7 Carotids 2+ bilaterally; no bruits. No lymphadenopathy or thryomegaly appreciated. Cor: PMI normal. Regular rate & rhythm. No rubs, gallops or murmurs. Lungs: clear Abdomen: soft, nontender, distended and obese. No hepatosplenomegaly. No bruits or masses. Good bowel sounds. Extremities: no cyanosis, clubbing, rash, trace edema Neuro: alert & orientedx3, cranial nerves grossly intact. Moves all 4 extremities w/o difficulty. Affect pleasant.    ASSESSMENT & PLAN:

## 2012-08-03 NOTE — Patient Instructions (Addendum)
Contact HF clinic after delivery  Do the following things EVERYDAY: 1) Weigh yourself in the morning before breakfast. Write it down and keep it in a log. 2) Take your medicines as prescribed 3) Eat low salt foods-Limit salt (sodium) to 2000 mg per day.  4) Stay as active as you can everyday 5) Limit all fluids for the day to less than 2 liters

## 2012-08-05 ENCOUNTER — Inpatient Hospital Stay (HOSPITAL_COMMUNITY)
Admission: AD | Admit: 2012-08-05 | Discharge: 2012-08-05 | Disposition: A | Discharge status: Home / Self Care | Payer: 59 | Source: Ambulatory Visit | Attending: Obstetrics & Gynecology | Admitting: Obstetrics & Gynecology

## 2012-08-05 ENCOUNTER — Inpatient Hospital Stay (HOSPITAL_COMMUNITY): Payer: 59

## 2012-08-05 ENCOUNTER — Encounter (HOSPITAL_COMMUNITY): Payer: Self-pay | Admitting: *Deleted

## 2012-08-05 DIAGNOSIS — O26879 Cervical shortening, unspecified trimester: Secondary | ICD-10-CM

## 2012-08-05 DIAGNOSIS — O34599 Maternal care for other abnormalities of gravid uterus, unspecified trimester: Secondary | ICD-10-CM | POA: Insufficient documentation

## 2012-08-05 DIAGNOSIS — O4420 Partial placenta previa NOS or without hemorrhage, unspecified trimester: Secondary | ICD-10-CM

## 2012-08-05 DIAGNOSIS — Z302 Encounter for sterilization: Secondary | ICD-10-CM

## 2012-08-05 DIAGNOSIS — O26859 Spotting complicating pregnancy, unspecified trimester: Secondary | ICD-10-CM

## 2012-08-05 DIAGNOSIS — R10811 Right upper quadrant abdominal tenderness: Secondary | ICD-10-CM

## 2012-08-05 DIAGNOSIS — N83209 Unspecified ovarian cyst, unspecified side: Secondary | ICD-10-CM | POA: Insufficient documentation

## 2012-08-05 DIAGNOSIS — O34219 Maternal care for unspecified type scar from previous cesarean delivery: Secondary | ICD-10-CM | POA: Insufficient documentation

## 2012-08-05 DIAGNOSIS — O10019 Pre-existing essential hypertension complicating pregnancy, unspecified trimester: Secondary | ICD-10-CM | POA: Insufficient documentation

## 2012-08-05 DIAGNOSIS — O479 False labor, unspecified: Secondary | ICD-10-CM

## 2012-08-05 DIAGNOSIS — O4410 Placenta previa with hemorrhage, unspecified trimester: Secondary | ICD-10-CM | POA: Insufficient documentation

## 2012-08-05 DIAGNOSIS — R109 Unspecified abdominal pain: Secondary | ICD-10-CM | POA: Insufficient documentation

## 2012-08-05 DIAGNOSIS — B373 Candidiasis of vulva and vagina: Secondary | ICD-10-CM

## 2012-08-05 DIAGNOSIS — N83201 Unspecified ovarian cyst, right side: Secondary | ICD-10-CM

## 2012-08-05 DIAGNOSIS — O4450 Low lying placenta with hemorrhage, unspecified trimester: Secondary | ICD-10-CM

## 2012-08-05 DIAGNOSIS — O4690 Antepartum hemorrhage, unspecified, unspecified trimester: Secondary | ICD-10-CM

## 2012-08-05 DIAGNOSIS — O0990 Supervision of high risk pregnancy, unspecified, unspecified trimester: Secondary | ICD-10-CM

## 2012-08-05 LAB — WET PREP, GENITAL: Trich, Wet Prep: NONE SEEN

## 2012-08-05 LAB — URINALYSIS, ROUTINE W REFLEX MICROSCOPIC
Bilirubin Urine: NEGATIVE
Glucose, UA: NEGATIVE mg/dL
Hgb urine dipstick: NEGATIVE
Ketones, ur: 15 mg/dL — AB
Leukocytes, UA: NEGATIVE
Nitrite: NEGATIVE
Protein, ur: NEGATIVE mg/dL
Specific Gravity, Urine: >1.03 — ABNORMAL HIGH (ref 1.005–1.030)
Urobilinogen, UA: 0.2 mg/dL (ref 0.0–1.0)
pH: 6.5 (ref 5.0–8.0)

## 2012-08-05 MED ORDER — FLUCONAZOLE 150 MG PO TABS: 150.0000 mg | ORAL_TABLET | Freq: Once | ORAL | Status: DC

## 2012-08-05 MED ORDER — FLUCONAZOLE 150 MG PO TABS
150.0000 mg | ORAL_TABLET | Freq: Once | ORAL | Status: AC
  Administered 2012-08-05: 150 mg via ORAL
  Filled 2012-08-05: qty 1

## 2012-08-05 NOTE — MAU Note (Signed)
Started her prenatal care here with MFM but is getting her care in [place] now; has CHF,hypertension,asthma,obese,etc;

## 2012-08-05 NOTE — MAU Note (Signed)
Noted a smear of blood when she wiped today; hx of preterm labor with a delivery (15 yrs) ago @ 28 weeks; works 6 days a week at about 8-10 hours a day; feeling vaginal pressure and c/o sharp pain on both sides of her abdomen and also c/o achy lower back; pain started around 1300 today and is not wearing a peri-pad; G2P1; is [redacted] weeks gestation today;

## 2012-08-05 NOTE — MAU Provider Note (Signed)
Chief Complaint:  Abdominal Pain, Vaginal Bleeding and Back Pain  HPI: [name] is a 37 y.o. G2P0101 at [redacted]w[redacted]d who presents to maternity admissions reporting one episode of spotting while going to the bathroom earlier today. This began around 1:15 PM and started to have lower abdominal pressure that was intermittent, lasted for several seconds, and has happened irregularly multiple times per hour since then.  Has had good fetal movement and denies recent drug use.    Pt did have previous history of eclampsia with LTCS at 28 weeks.  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  Pt was seen by MFM here before transferring to Dr. Vincenza Hews at [facility] around the beginning of December 2013.  She was diagnosed with a moderate placenta previa that had been followed and had moved posterior to the os as of November 29,2013.    Denies leakage of fluid or current vaginal bleeding. No recent sexual activity and nothing per vagina in the last two weeks.    Pregnancy Course:   Past Medical History: Past Medical History  Diagnosis Date  . Hypertension     currently on Labetalol 200mg  BID  . Infection     UTI;not frequent;last infection was in June  . Infection     Yeast;not frequent  . Asthma     Inhaler prn;triggered by seasonal changes mostly cold weathert;last asthma  attack years ago  . CHF (congestive heart failure) 01/2006    Has Pacemaker and Defibrillator;was on heart meds;no longer taking since pregancy  . Anemia     Iron supplements in the past  . Migraines     Can't take Procardia XL, causes migraines  . Varicose veins 1998    Developed during last pregnancy  . Other primary cardiomyopathies   . Atrial fibrillation   . Paroxysmal ventricular tachycardia   . CHF (congestive heart failure)   . Hypertension   . Acute bronchitis and bronchiolitis     Past obstetric history: OB History    Grav Para Term Preterm Abortions TAB SAB Ect Mult Living   2  1  1      1      # Outc Date GA Lbr Len/2nd Wgt Sex Del Anes PTL Lv   1 PRE 7/98 [redacted]w[redacted]d  2lb14oz(1.304kg) F CS EPI  Yes   Comments: Preeclampsia   2 CUR               Past Surgical History: Past Surgical History  Procedure Date  . Cesarean section 1998    d/t preeclampsia  . Breast reduction surgery 03/05/1994    d/t back, shoulder, and neck pain (42 F)  . Insert / replace / remove pacemaker  02/10/2006    Status post implantable cardioverter-defibrillator insertion  .Marland KitchenMarland KitchenMarland KitchenThe Medtronic Maximo VR, model 7232, single chamber  . Cardiac catheterization     EF 30-35%    Family History: Family History  Problem Relation Age of Onset  . Sickle cell trait Daughter   . Other Father     Varicose veins  . Asthma Daughter   . Seizures Maternal Uncle   . Migraines Mother   . Migraines Daughter   . Migraines Cousin     Maternal  . Cancer Paternal Grandmother     Stomach  . Cancer Maternal Uncle     Prostate  . Cancer Maternal Uncle     Lung  . Ulcers Mother   . Lupus Maternal Aunt   .  Hypertension Father   . Hypertension Paternal Aunt   . Other Daughter     blood transfusion @ birth;platelets were low  . Rheum arthritis Mother   . Diabetes Father     Controlled w/ diet and exercise  . Diabetes Maternal Aunt   . Diabetes Paternal Aunt     x 2  . Diabetes Paternal Uncle   . Hypertension    . Heart disease      Social History: History  Substance Use Topics  . Smoking status: Never Smoker   . Smokeless tobacco: Never Used  . Alcohol Use: No    Allergies:  Allergies  Allergen Reactions  . Oxycodone-Acetaminophen Shortness Of Breath  . Percocet (Oxycodone-Acetaminophen) Shortness Of Breath    Meds:  Prescriptions prior to admission  Medication Sig Dispense Refill  . aspirin 81 MG tablet Take 81 mg by mouth daily.      . Cholecalciferol (VITAMIN D) 2000 UNITS tablet Take 2,000 Units by mouth daily.        . hydrochlorothiazide (HYDRODIURIL) 25 MG tablet Take 1  tablet (25 mg total) by mouth daily.  30 tablet  6  . labetalol (NORMODYNE) 100 MG tablet Take 500 mg by mouth 3 (three) times daily.      . Magnesium 250 MG TABS Take 1 tablet by mouth daily.        Marland Kitchen omega-3 fish oil (MAXEPA) 1000 MG CAPS capsule Take by mouth daily.        Marland Kitchen OVER THE COUNTER MEDICATION Take 1 tablet by mouth daily. Patient takes over the counter magnesium      . potassium chloride SA (K-DUR,KLOR-CON) 20 MEQ tablet Take 1 tablet (20 mEq total) by mouth 2 (two) times daily.  60 tablet  6  . Prenat w/o A-FeCbn-DSS-FA-DHA (CITRANATAL 90 DHA PO) Take 1 tablet by mouth daily.      . vitamin C (ASCORBIC ACID) 500 MG tablet Take 500 mg by mouth daily.        Marland Kitchen albuterol (PROVENTIL HFA;VENTOLIN HFA) 108 (90 BASE) MCG/ACT inhaler Inhale 2 puffs into the lungs every 6 (six) hours as needed. Rescue inhaler      . methyldopa (ALDOMET) 250 MG tablet Take 1 tablet (250 mg total) by mouth 2 (two) times daily.  60 tablet  3    ROS: Pertinent findings in history of present illness.  Physical Exam  Blood pressure 118/66, pulse 81, temperature 98.1 F (36.7 C), temperature source Oral, resp. rate 20, height 5\' 2"  (1.575 m), weight 109.68 kg (241 lb 12.8 oz), last menstrual period 01/22/2012, SpO2 97.00%. GENERAL: Well-developed, well-nourished female in no acute distress.  HEENT: EOMI B/L HEART: RRR, no murmurs appreciated RESP: CTA B/L ABDOMEN: no RUQ pain, NABS, TTP at suprapubic region, otherwise no TTP EXTREMITIES: Nontender, Trace edema B/L NEURO: alert and oriented SPECULUM EXAM: No blood, +mucoid discharge, cervix closed     FHT:  Baseline 140 , moderate variability, accelerations present, no decelerations Contractions: none    Labs: Results for orders placed during the hospital encounter of 08/05/12 (from the past 24 hour(s))  URINALYSIS, ROUTINE W REFLEX MICROSCOPIC     Status: Abnormal   Collection Time   08/05/12  4:20 PM      Component Value Range   Color, Urine YELLOW   YELLOW   APPearance CLEAR  CLEAR   Specific Gravity, Urine >1.030 (*) 1.005 - 1.030   pH 6.5  5.0 - 8.0   Glucose, UA NEGATIVE  NEGATIVE  mg/dL   Hgb urine dipstick NEGATIVE  NEGATIVE   Bilirubin Urine NEGATIVE  NEGATIVE   Ketones, ur 15 (*) NEGATIVE mg/dL   Protein, ur NEGATIVE  NEGATIVE mg/dL   Urobilinogen, UA 0.2  0.0 - 1.0 mg/dL   Nitrite NEGATIVE  NEGATIVE   Leukocytes, UA NEGATIVE  NEGATIVE  WET PREP, GENITAL     Status: Abnormal   Collection Time   08/05/12  6:22 PM      Component Value Range   Yeast Wet Prep HPF POC MODERATE (*) NONE SEEN   Trich, Wet Prep NONE SEEN  NONE SEEN   Clue Cells Wet Prep HPF POC NONE SEEN  NONE SEEN   WBC, Wet Prep HPF POC MANY (*) NONE SEEN    Imaging:  No results found. MAU Course: 1) Due to previous Hx of Placenta Previa, will get [place].  Also, will do speculum exam and get FFN (holding), Wet Prep, GC/Chlamydia.  FHT reactive.  Continue to monitor.     Assessment: 1. Supervision of high-risk pregnancy   2. Low lying placenta with hemorrhage, antepartum   3. Previous cesarean delivery, delivered, with or without mention of antepartum condition   4. Sterilization   5. Bilateral ovarian cysts   6. Marginal placenta previa   7. Vaginal bleeding in pregnancy     Plan: Discharge home Tx Yeast vaginitis with diflucan x 1.  Most likely the cause of her vaginal spotting.  Specific instructions on when to return and f/u with her MFM doctor     Medication List     As of 08/05/2012  7:06 PM    ASK your doctor about these medications         albuterol 108 (90 BASE) MCG/ACT inhaler   Commonly known as: PROVENTIL HFA;VENTOLIN HFA   Inhale 2 puffs into the lungs every 6 (six) hours as needed. Rescue inhaler      aspirin 81 MG tablet   Take 81 mg by mouth daily.      CITRANATAL 90 DHA PO   Take 1 tablet by mouth daily.      hydrochlorothiazide 25 MG tablet   Commonly known as: HYDRODIURIL   Take 1 tablet (25 mg total) by mouth daily.       labetalol 100 MG tablet   Commonly known as: NORMODYNE   Take 500 mg by mouth 3 (three) times daily.      Magnesium 250 MG Tabs   Take 1 tablet by mouth daily.      methyldopa 250 MG tablet   Commonly known as: ALDOMET   Take 1 tablet (250 mg total) by mouth 2 (two) times daily.      omega-3 fish oil 1000 MG Caps capsule   Commonly known as: MAXEPA   Take by mouth daily.      OVER THE COUNTER MEDICATION   Take 1 tablet by mouth daily. Patient takes over the counter magnesium      potassium chloride SA 20 MEQ tablet   Commonly known as: K-DUR,KLOR-CON   Take 1 tablet (20 mEq total) by mouth 2 (two) times daily.      vitamin C 500 MG tablet   Commonly known as: ASCORBIC ACID   Take 500 mg by mouth daily.      Vitamin D 2000 UNITS tablet   Take 2,000 Units by mouth daily.        Briscoe Deutscher, DO 08/05/2012 7:06 PM  I have seen and examined patient and  reviewed her labs, fetal heart tracing, records, vitals and imaging results. I agree with above except as noted below.  LIMITED OBSTETRIC ULTRASOUND  Number of Fetuses: 1  Heart Rate: 150 bpm  Movement: Yes  Presentation: Transverse  Placental Location: Posterior  Previa: No  Amniotic Fluid (Subjective): Normal  AFI: 20.1 cm (5%ile 94 cm, 95%ile 228 cm)  BPD: 6.3cm 25w 5d  MATERNAL FINDINGS:  Cervix: Closed, shortened measuring 2.30 cm.  IMPRESSION:  Single living intrauterine gestation. The estimated gestational  age is 25 weeks and 5 days.  Recommend followup with non-emergent complete OB 14+ wk US  examination for fetal biometric evaluation and anatomic survey if  not already performed.  Original Report Authenticated By: Signa Kell, M.D.  Assessment/Plan:   1.  Vaginal candidiasis causing minimal vaginal bleeding - treat with diflucan. Placenta is not low-lying or previa. 2.  Abdominal pain:  Likely due to yeast/braxton hicks contractions. Sono shows cervix closed, normal placenta. Does have shortened  cervix but not in labor. No contractions on monitor.  3.  Hypertension. BP readings normal except one reading of 149/78. Asymptomatic. On multiple medications for chronic hypertension. 4.  Patient stable to discharge home. Has f/u with MFM at [facility] on 1/16. Discussed with Dr. Despina Hidden.   Napoleon Form, MD 08/05/12

## 2012-08-05 NOTE — MAU Note (Signed)
Patent states she has had some spotting today with lower abdominal pressure and some abdominal pain. Reports good fetal movement.

## 2012-08-06 LAB — GC/CHLAMYDIA PROBE AMP: CT Probe RNA: NEGATIVE

## 2012-09-05 ENCOUNTER — Ambulatory Visit (HOSPITAL_COMMUNITY)
Admission: RE | Admit: 2012-09-05 | Discharge: 2012-09-05 | Disposition: A | Discharge status: Home / Self Care | Payer: 59 | Source: Ambulatory Visit | Attending: Maternal and Fetal Medicine | Admitting: Maternal and Fetal Medicine

## 2012-09-05 ENCOUNTER — Other Ambulatory Visit (HOSPITAL_COMMUNITY): Payer: Self-pay | Admitting: Maternal and Fetal Medicine

## 2012-09-05 ENCOUNTER — Ambulatory Visit (HOSPITAL_COMMUNITY)
Admission: RE | Admit: 2012-09-05 | Discharge: 2012-09-05 | Disposition: A | Discharge status: Home / Self Care | Payer: 59 | Source: Ambulatory Visit | Attending: Obstetrics & Gynecology | Admitting: Obstetrics & Gynecology

## 2012-09-05 DIAGNOSIS — Z3689 Encounter for other specified antenatal screening: Secondary | ICD-10-CM | POA: Insufficient documentation

## 2012-09-05 DIAGNOSIS — O0990 Supervision of high risk pregnancy, unspecified, unspecified trimester: Secondary | ICD-10-CM

## 2012-09-05 DIAGNOSIS — O4690 Antepartum hemorrhage, unspecified, unspecified trimester: Secondary | ICD-10-CM

## 2012-09-05 DIAGNOSIS — O34219 Maternal care for unspecified type scar from previous cesarean delivery: Secondary | ICD-10-CM

## 2012-09-05 DIAGNOSIS — N83202 Unspecified ovarian cyst, left side: Secondary | ICD-10-CM

## 2012-09-05 DIAGNOSIS — O288 Other abnormal findings on antenatal screening of mother: Secondary | ICD-10-CM

## 2012-09-05 DIAGNOSIS — O4450 Low lying placenta with hemorrhage, unspecified trimester: Secondary | ICD-10-CM

## 2012-09-05 DIAGNOSIS — Z302 Encounter for sterilization: Secondary | ICD-10-CM

## 2012-09-05 DIAGNOSIS — O289 Unspecified abnormal findings on antenatal screening of mother: Secondary | ICD-10-CM | POA: Insufficient documentation

## 2012-09-05 DIAGNOSIS — O4420 Partial placenta previa NOS or without hemorrhage, unspecified trimester: Secondary | ICD-10-CM

## 2012-09-05 NOTE — Progress Notes (Signed)
[name]  was seen today for an ultrasound appointment.  See full report in [facility].  Impression: Single IUP at 32 3/7 weeks Pregnancy complicated by congestive heart failure, GDM Active fetus with BPP of 8/10 (-2 for non reactive NST)  Recommendations: Continue 2x weekly NSTs.  Has follow up later this week at [facility].  Alpha Gula, MD

## 2012-09-05 NOTE — ED Notes (Signed)
Fetal tracing entered under another patient in obix.  See [id number] from [phone] for fetal tracing.

## 2012-09-12 ENCOUNTER — Encounter (HOSPITAL_COMMUNITY): Payer: Self-pay

## 2012-09-12 ENCOUNTER — Ambulatory Visit (HOSPITAL_COMMUNITY)
Admission: RE | Admit: 2012-09-12 | Discharge: 2012-09-12 | Disposition: A | Discharge status: Home / Self Care | Payer: 59 | Source: Ambulatory Visit | Attending: Obstetrics & Gynecology | Admitting: Obstetrics & Gynecology

## 2012-09-12 VITALS — BP 137/87 | HR 75 | Wt 236.5 lb

## 2012-09-12 DIAGNOSIS — O4450 Low lying placenta with hemorrhage, unspecified trimester: Secondary | ICD-10-CM

## 2012-09-12 DIAGNOSIS — O34219 Maternal care for unspecified type scar from previous cesarean delivery: Secondary | ICD-10-CM

## 2012-09-12 DIAGNOSIS — O99419 Diseases of the circulatory system complicating pregnancy, unspecified trimester: Secondary | ICD-10-CM | POA: Insufficient documentation

## 2012-09-12 DIAGNOSIS — Z302 Encounter for sterilization: Secondary | ICD-10-CM

## 2012-09-12 DIAGNOSIS — O4690 Antepartum hemorrhage, unspecified, unspecified trimester: Secondary | ICD-10-CM

## 2012-09-12 DIAGNOSIS — I509 Heart failure, unspecified: Secondary | ICD-10-CM | POA: Insufficient documentation

## 2012-09-12 DIAGNOSIS — O4420 Partial placenta previa NOS or without hemorrhage, unspecified trimester: Secondary | ICD-10-CM

## 2012-09-12 DIAGNOSIS — N83201 Unspecified ovarian cyst, right side: Secondary | ICD-10-CM

## 2012-09-12 DIAGNOSIS — O0990 Supervision of high risk pregnancy, unspecified, unspecified trimester: Secondary | ICD-10-CM

## 2012-09-12 DIAGNOSIS — O99810 Abnormal glucose complicating pregnancy: Secondary | ICD-10-CM | POA: Insufficient documentation

## 2012-09-12 DIAGNOSIS — I2510 Atherosclerotic heart disease of native coronary artery without angina pectoris: Secondary | ICD-10-CM | POA: Insufficient documentation

## 2012-09-19 ENCOUNTER — Ambulatory Visit (HOSPITAL_COMMUNITY)
Admission: RE | Admit: 2012-09-19 | Discharge: 2012-09-19 | Disposition: A | Discharge status: Home / Self Care | Payer: 59 | Source: Ambulatory Visit | Attending: Maternal and Fetal Medicine | Admitting: Maternal and Fetal Medicine

## 2012-09-19 VITALS — BP 153/83 | HR 73 | Wt 235.0 lb

## 2012-09-19 DIAGNOSIS — N83202 Unspecified ovarian cyst, left side: Secondary | ICD-10-CM

## 2012-09-19 DIAGNOSIS — O34219 Maternal care for unspecified type scar from previous cesarean delivery: Secondary | ICD-10-CM

## 2012-09-19 DIAGNOSIS — O0993 Supervision of high risk pregnancy, unspecified, third trimester: Secondary | ICD-10-CM

## 2012-09-19 DIAGNOSIS — O289 Unspecified abnormal findings on antenatal screening of mother: Secondary | ICD-10-CM | POA: Insufficient documentation

## 2012-09-19 DIAGNOSIS — Z302 Encounter for sterilization: Secondary | ICD-10-CM

## 2012-09-19 NOTE — Progress Notes (Signed)
Active fetal movement noted through out NST.  [place] held on manually due to fetal movement.

## 2012-09-26 ENCOUNTER — Ambulatory Visit (HOSPITAL_COMMUNITY): Payer: 59 | Attending: Maternal and Fetal Medicine

## 2012-10-03 ENCOUNTER — Ambulatory Visit (HOSPITAL_COMMUNITY): Payer: 59

## 2012-10-10 ENCOUNTER — Ambulatory Visit (HOSPITAL_COMMUNITY): Payer: 59

## 2012-10-17 ENCOUNTER — Ambulatory Visit (HOSPITAL_COMMUNITY): Payer: 59

## 2012-10-26 ENCOUNTER — Ambulatory Visit (HOSPITAL_COMMUNITY)
Admission: RE | Admit: 2012-10-26 | Discharge: 2012-10-26 | Disposition: A | Discharge status: Home / Self Care | Payer: 59 | Source: Ambulatory Visit | Attending: Internal Medicine | Admitting: Internal Medicine

## 2012-10-26 VITALS — BP 178/102 | HR 78 | Wt 227.0 lb

## 2012-10-26 DIAGNOSIS — I509 Heart failure, unspecified: Secondary | ICD-10-CM | POA: Insufficient documentation

## 2012-10-26 DIAGNOSIS — I5022 Chronic systolic (congestive) heart failure: Secondary | ICD-10-CM | POA: Insufficient documentation

## 2012-10-26 DIAGNOSIS — I10 Essential (primary) hypertension: Secondary | ICD-10-CM

## 2012-10-26 MED ORDER — HYDRALAZINE HCL 50 MG PO TABS: 75.0000 mg | ORAL_TABLET | Freq: Three times a day (TID) | ORAL | Status: DC

## 2012-10-26 MED ORDER — LABETALOL HCL 300 MG PO TABS: 600.0000 mg | ORAL_TABLET | Freq: Two times a day (BID) | ORAL | Status: DC

## 2012-10-26 NOTE — Progress Notes (Signed)
Patient ID: [name], female   DOB: 06-25-1976, 37 y.o.   MRN: [id number] PCP: Dr. Kellie Shropshire  OB:  Dr. Pennie Rushing  Heart Failure: Dr Lendon Colonel High Risk Ob/Gyn: Dr Vincenza Hews HPI: [name] is a 37 year old morbidly obese woman with h/o chronic systolic heart failure due to nonischemic cardiomyopathy (previously 25% in 2007 but now with recovered EF) s/p ICD implantation, hypertension, and obesity.    Cardiac Cath 2007 -Findings consistent with nonischemic, mildly dilated cardiomyopathy. This is probably related to hypertension with hypertensive heart disease. Severe left ventricular systolic dysfunction, ejection fraction around 35% with moderate to marked global hypokinesis. Moderate pulmonary hypertension. Preserved cardiac output and cardiac index.  ECHOs 04/27/2011 45-50%.   08/20/11 ECHO 55-60%   05/19/12 EF ~40%   07/01/12 EF ~40%   08/02/12 EF~40%   2/14 EF ~50%  She returns for follow up today after the birth of her baby. She was referred back by Dr Ardean Larsen. Denies dyspnea/CP/orthopnea.   SBP at home 150-170s . Complaining of headache. Compliant with medications.       ROS: All systems negative except as listed in HPI, PMH and Problem List.  Past Medical History  Diagnosis Date  . Hypertension     currently on Labetalol 200mg  BID  . Infection     UTI;not frequent;last infection was in June  . Infection     Yeast;not frequent  . Asthma     Inhaler prn;triggered by seasonal changes mostly cold weathert;last asthma  attack years ago  . CHF (congestive heart failure) 01/2006    Has Pacemaker and Defibrillator;was on heart meds;no longer taking since pregancy  . Anemia     Iron supplements in the past  . Migraines     Can't take Procardia XL, causes migraines  . Varicose veins 1998    Developed during last pregnancy  . Other primary cardiomyopathies   . Atrial fibrillation   . Paroxysmal ventricular tachycardia   . CHF (congestive heart failure)   . Hypertension   . Acute bronchitis and  bronchiolitis     Current Outpatient Prescriptions  Medication Sig Dispense Refill  . albuterol (PROVENTIL HFA;VENTOLIN HFA) 108 (90 BASE) MCG/ACT inhaler Inhale 2 puffs into the lungs every 6 (six) hours as needed. Rescue inhaler      . aspirin 81 MG tablet Take 81 mg by mouth daily.      . Cholecalciferol (VITAMIN D) 2000 UNITS tablet Take 2,000 Units by mouth daily.        . DOCOSAHEXAENOIC ACID PO Take 1 capsule by mouth daily.      Marland Kitchen docusate sodium (COLACE) 100 MG capsule Take 100 mg by mouth 2 (two) times daily.      . ferrous sulfate 325 (65 FE) MG EC tablet Take 325 mg by mouth daily with breakfast.      . hydrALAZINE (APRESOLINE) 50 MG tablet Take 50 mg by mouth 2 (two) times daily.      Marland Kitchen labetalol (NORMODYNE) 100 MG tablet Take 600 mg by mouth 2 (two) times daily.       . Magnesium 250 MG TABS Take 1 tablet by mouth daily.        Marland Kitchen omega-3 fish oil (MAXEPA) 1000 MG CAPS capsule Take by mouth daily.        . potassium chloride SA (K-DUR,KLOR-CON) 20 MEQ tablet Take 1 tablet (20 mEq total) by mouth 2 (two) times daily.  60 tablet  6   No current facility-administered medications for this encounter.  PHYSICAL EXAM: Filed Vitals:   10/26/12 1221  BP: 178/102  Pulse: 78  Weight: 227 lb (102.967 kg)  SpO2: 98%    General:  Well appearing. No resp difficulty HEENT: normal Neck: supple. JVP 6-7 Carotids 2+ bilaterally; no bruits. No lymphadenopathy or thryomegaly appreciated. Cor: PMI normal. Regular rate & rhythm. No rubs, gallops or murmurs. Lungs: clear Abdomen: soft, nontender, non-distended and obese. No hepatosplenomegaly. No bruits or masses. Good bowel sounds. Extremities: no cyanosis, clubbing, rash, trace edema Neuro: alert & orientedx3, cranial nerves grossly intact. Moves all 4 extremities w/o difficulty. Affect pleasant.    ASSESSMENT & PLAN:

## 2012-10-26 NOTE — Patient Instructions (Addendum)
Take extra lasix for the next 2 days  Take hydralazine 75 mg three times a day  Take Labetalol 600 mg three times a day  Follow up in 2 weeks.

## 2012-10-26 NOTE — Assessment & Plan Note (Addendum)
BP remains elevated. She continues to breast feed. Increase labetalol 600 mg tid and increase hydralazine 50 mg tid. Follow up in 2 weeks to reassess BP.   Patient seen and examined with Tonye Becket, NP. We discussed all aspects of the encounter. I agree with the assessment and plan as stated above.  BP remains elevated. She is breastfeeding so have to be careful with drug choice. Increase agents as below. We considered adding spiro but it does have some secretion into breast milk so we have opted against this for now. Changes as above.

## 2012-10-26 NOTE — Assessment & Plan Note (Addendum)
Volume status stable. Continue current diuretic regimen. Follow up in 2 weeks. Will need to schedule ECHO.  Attending: Volume status appears mildly elevated to me and this may be contributing to her HTN. Apparently had ECHO at [facility] prior to C-section and EF up to 50%. Reinforced need for daily weights and reviewed use of sliding scale diuretics.Can double lasix to 40 bid as needed. Will take extra KCL 20 with extra lasix.

## 2012-11-10 ENCOUNTER — Ambulatory Visit (HOSPITAL_COMMUNITY)
Admission: RE | Admit: 2012-11-10 | Discharge: 2012-11-10 | Disposition: A | Discharge status: Home / Self Care | Payer: 59 | Source: Ambulatory Visit | Attending: Internal Medicine | Admitting: Internal Medicine

## 2012-11-10 VITALS — BP 162/102 | HR 82 | Wt 227.0 lb

## 2012-11-10 DIAGNOSIS — I4891 Unspecified atrial fibrillation: Secondary | ICD-10-CM | POA: Insufficient documentation

## 2012-11-10 DIAGNOSIS — I8390 Asymptomatic varicose veins of unspecified lower extremity: Secondary | ICD-10-CM | POA: Insufficient documentation

## 2012-11-10 DIAGNOSIS — I428 Other cardiomyopathies: Secondary | ICD-10-CM | POA: Insufficient documentation

## 2012-11-10 DIAGNOSIS — Z79899 Other long term (current) drug therapy: Secondary | ICD-10-CM | POA: Insufficient documentation

## 2012-11-10 DIAGNOSIS — I10 Essential (primary) hypertension: Secondary | ICD-10-CM | POA: Insufficient documentation

## 2012-11-10 DIAGNOSIS — Z95810 Presence of automatic (implantable) cardiac defibrillator: Secondary | ICD-10-CM | POA: Insufficient documentation

## 2012-11-10 DIAGNOSIS — I5022 Chronic systolic (congestive) heart failure: Secondary | ICD-10-CM | POA: Insufficient documentation

## 2012-11-10 DIAGNOSIS — Z7982 Long term (current) use of aspirin: Secondary | ICD-10-CM | POA: Insufficient documentation

## 2012-11-10 DIAGNOSIS — G43909 Migraine, unspecified, not intractable, without status migrainosus: Secondary | ICD-10-CM | POA: Insufficient documentation

## 2012-11-10 DIAGNOSIS — J45909 Unspecified asthma, uncomplicated: Secondary | ICD-10-CM | POA: Insufficient documentation

## 2012-11-10 DIAGNOSIS — I509 Heart failure, unspecified: Secondary | ICD-10-CM | POA: Insufficient documentation

## 2012-11-10 MED ORDER — HYDRALAZINE HCL 100 MG PO TABS: 100.0000 mg | ORAL_TABLET | Freq: Three times a day (TID) | ORAL | Status: DC

## 2012-11-10 NOTE — Patient Instructions (Addendum)
Increase hydralazine 100 mg three times a day.    Start allergy medication.

## 2012-11-10 NOTE — Progress Notes (Signed)
PCP: Dr. Kellie Shropshire  OB:  Dr. Pennie Rushing  Heart Failure: Dr Lendon Colonel High Risk Ob/Gyn: Dr Vincenza Hews  HPI: [name] is a 37 year old morbidly obese woman with h/o chronic systolic heart failure due to nonischemic cardiomyopathy (previously 25% in 2007 but now with recovered EF) s/p ICD implantation, hypertension, and obesity.    Cardiac Cath 2007 -Findings consistent with nonischemic, mildly dilated cardiomyopathy. This is probably related to hypertension with hypertensive heart disease. Severe left ventricular systolic dysfunction, ejection fraction around 35% with moderate to marked global hypokinesis. Moderate pulmonary hypertension. Preserved cardiac output and cardiac index.  ECHOs 04/27/2011 45-50%.   08/20/11 ECHO 55-60%   05/19/12 EF ~40%   07/01/12 EF ~40%   08/02/12 EF~40%    2/14 EF ~50%  She returns for 2 week BP follow up today.  Last visit labetalol 600 mg BID and hydralazine 75 mg TID for hypertension. In the past week she has been experiencing an asthma flare and needing to use her inhaler as well as reflux and has restarted zantac.  Her BP at home has been running 140-150/80s.  Some SOB with stairs.  No orthopnea/PND.    ROS: All systems negative except as listed in HPI, PMH and Problem List.  Past Medical History  Diagnosis Date  . Hypertension     currently on Labetalol 200mg  BID  . Infection     UTI;not frequent;last infection was in June  . Infection     Yeast;not frequent  . Asthma     Inhaler prn;triggered by seasonal changes mostly cold weathert;last asthma  attack years ago  . CHF (congestive heart failure) 01/2006    Has Pacemaker and Defibrillator;was on heart meds;no longer taking since pregancy  . Anemia     Iron supplements in the past  . Migraines     Can't take Procardia XL, causes migraines  . Varicose veins 1998    Developed during last pregnancy  . Other primary cardiomyopathies   . Atrial fibrillation   . Paroxysmal ventricular tachycardia   . CHF (congestive  heart failure)   . Hypertension   . Acute bronchitis and bronchiolitis     Current Outpatient Prescriptions  Medication Sig Dispense Refill  . albuterol (PROVENTIL HFA;VENTOLIN HFA) 108 (90 BASE) MCG/ACT inhaler Inhale 2 puffs into the lungs every 6 (six) hours as needed. Rescue inhaler      . aspirin 81 MG tablet Take 81 mg by mouth daily.      . Cholecalciferol (VITAMIN D) 2000 UNITS tablet Take 2,000 Units by mouth daily.        . DOCOSAHEXAENOIC ACID PO Take 1 capsule by mouth daily.      Marland Kitchen docusate sodium (COLACE) 100 MG capsule Take 100 mg by mouth 2 (two) times daily.      . ferrous sulfate 325 (65 FE) MG EC tablet Take 325 mg by mouth daily with breakfast.      . hydrALAZINE (APRESOLINE) 50 MG tablet Take 1.5 tablets (75 mg total) by mouth 3 (three) times daily.  140 tablet  3  . labetalol (NORMODYNE) 300 MG tablet Take 600 mg by mouth 3 (three) times daily.      . Magnesium 250 MG TABS Take 1 tablet by mouth daily.        Marland Kitchen omega-3 fish oil (MAXEPA) 1000 MG CAPS capsule Take by mouth daily.        . potassium chloride SA (K-DUR,KLOR-CON) 20 MEQ tablet Take 1 tablet (20 mEq total) by mouth 2 (  two) times daily.  60 tablet  6   No current facility-administered medications for this encounter.     PHYSICAL EXAM: Filed Vitals:   11/10/12 1303  BP: 162/102  Pulse: 82  Weight: 227 lb (102.967 kg)  SpO2: 92%  Repeat 148/98  General:  Well appearing. No resp difficulty HEENT: normal Neck: supple. JVP 6-7 Carotids 2+ bilaterally; no bruits. No lymphadenopathy or thryomegaly appreciated. Cor: PMI normal. Regular rate & rhythm. No rubs, gallops or murmurs. Lungs: clear Abdomen: soft, nontender, non-distended and obese. No hepatosplenomegaly. No bruits or masses. Good bowel sounds. Extremities: no cyanosis, clubbing, rash, trace edema Neuro: alert & orientedx3, cranial nerves grossly intact. Moves all 4 extremities w/o difficulty. Affect pleasant.    ASSESSMENT & PLAN:

## 2012-11-11 NOTE — Assessment & Plan Note (Addendum)
Volume status stable.  Can double lasix if need be for weight gain or edema.

## 2012-11-11 NOTE — Assessment & Plan Note (Addendum)
Would like SBP <140.  Repeat BP in office today showed BP 148/96 therefore will increase hydralazine 100 mg TID.  She will continue to take her BP daily and call if remaining greater than 150/100.  She understands the importance of BP control and states that if BP is not well controlled on current regimen she would consider stopping breastfeeding so we can use other agents.  Will discuss further at follow up if need be.

## 2012-11-30 ENCOUNTER — Other Ambulatory Visit (HOSPITAL_COMMUNITY): Payer: Self-pay | Admitting: Cardiology

## 2012-12-11 ENCOUNTER — Inpatient Hospital Stay (HOSPITAL_COMMUNITY)
Admission: EM | Admit: 2012-12-11 | Discharge: 2012-12-14 | DRG: 291 | Disposition: A | Discharge status: Home / Self Care | Payer: 59 | Attending: Internal Medicine | Admitting: Internal Medicine

## 2012-12-11 ENCOUNTER — Encounter (HOSPITAL_COMMUNITY): Payer: Self-pay | Admitting: *Deleted

## 2012-12-11 DIAGNOSIS — Z833 Family history of diabetes mellitus: Secondary | ICD-10-CM

## 2012-12-11 DIAGNOSIS — G43909 Migraine, unspecified, not intractable, without status migrainosus: Secondary | ICD-10-CM | POA: Diagnosis present

## 2012-12-11 DIAGNOSIS — I5023 Acute on chronic systolic (congestive) heart failure: Secondary | ICD-10-CM

## 2012-12-11 DIAGNOSIS — R0602 Shortness of breath: Secondary | ICD-10-CM

## 2012-12-11 DIAGNOSIS — Z801 Family history of malignant neoplasm of trachea, bronchus and lung: Secondary | ICD-10-CM

## 2012-12-11 DIAGNOSIS — Z800 Family history of malignant neoplasm of digestive organs: Secondary | ICD-10-CM

## 2012-12-11 DIAGNOSIS — I509 Heart failure, unspecified: Secondary | ICD-10-CM

## 2012-12-11 DIAGNOSIS — Z79899 Other long term (current) drug therapy: Secondary | ICD-10-CM

## 2012-12-11 DIAGNOSIS — I4729 Other ventricular tachycardia: Secondary | ICD-10-CM | POA: Diagnosis present

## 2012-12-11 DIAGNOSIS — I129 Hypertensive chronic kidney disease with stage 1 through stage 4 chronic kidney disease, or unspecified chronic kidney disease: Secondary | ICD-10-CM | POA: Diagnosis present

## 2012-12-11 DIAGNOSIS — R000 Tachycardia, unspecified: Secondary | ICD-10-CM

## 2012-12-11 DIAGNOSIS — Z885 Allergy status to narcotic agent status: Secondary | ICD-10-CM

## 2012-12-11 DIAGNOSIS — I169 Hypertensive crisis, unspecified: Secondary | ICD-10-CM

## 2012-12-11 DIAGNOSIS — I4891 Unspecified atrial fibrillation: Secondary | ICD-10-CM | POA: Diagnosis present

## 2012-12-11 DIAGNOSIS — Z9119 Patient's noncompliance with other medical treatment and regimen: Secondary | ICD-10-CM

## 2012-12-11 DIAGNOSIS — J9600 Acute respiratory failure, unspecified whether with hypoxia or hypercapnia: Secondary | ICD-10-CM | POA: Diagnosis present

## 2012-12-11 DIAGNOSIS — Z95810 Presence of automatic (implantable) cardiac defibrillator: Secondary | ICD-10-CM

## 2012-12-11 DIAGNOSIS — I428 Other cardiomyopathies: Secondary | ICD-10-CM | POA: Diagnosis present

## 2012-12-11 DIAGNOSIS — Z8249 Family history of ischemic heart disease and other diseases of the circulatory system: Secondary | ICD-10-CM

## 2012-12-11 DIAGNOSIS — Z91199 Patient's noncompliance with other medical treatment and regimen due to unspecified reason: Secondary | ICD-10-CM

## 2012-12-11 DIAGNOSIS — N179 Acute kidney failure, unspecified: Secondary | ICD-10-CM | POA: Diagnosis present

## 2012-12-11 DIAGNOSIS — Z825 Family history of asthma and other chronic lower respiratory diseases: Secondary | ICD-10-CM

## 2012-12-11 DIAGNOSIS — Z7982 Long term (current) use of aspirin: Secondary | ICD-10-CM

## 2012-12-11 DIAGNOSIS — I5033 Acute on chronic diastolic (congestive) heart failure: Principal | ICD-10-CM

## 2012-12-11 DIAGNOSIS — I472 Ventricular tachycardia, unspecified: Secondary | ICD-10-CM | POA: Diagnosis present

## 2012-12-11 DIAGNOSIS — R600 Localized edema: Secondary | ICD-10-CM

## 2012-12-11 DIAGNOSIS — N189 Chronic kidney disease, unspecified: Secondary | ICD-10-CM | POA: Diagnosis present

## 2012-12-11 DIAGNOSIS — J45909 Unspecified asthma, uncomplicated: Secondary | ICD-10-CM | POA: Diagnosis present

## 2012-12-11 DIAGNOSIS — Z6841 Body Mass Index (BMI) 40.0 and over, adult: Secondary | ICD-10-CM

## 2012-12-11 MED ORDER — FUROSEMIDE 10 MG/ML IJ SOLN
80.0000 mg | Freq: Once | INTRAMUSCULAR | Status: AC
  Administered 2012-12-12: 80 mg via INTRAVENOUS
  Filled 2012-12-11: qty 8

## 2012-12-11 MED ORDER — IPRATROPIUM BROMIDE 0.02 % IN SOLN
0.5000 mg | RESPIRATORY_TRACT | Status: DC
  Administered 2012-12-12 (×3): 0.5 mg via RESPIRATORY_TRACT
  Filled 2012-12-11 (×3): qty 2.5

## 2012-12-11 MED ORDER — NITROGLYCERIN IN D5W 200-5 MCG/ML-% IV SOLN
50.0000 ug/min | Freq: Once | INTRAVENOUS | Status: AC
  Administered 2012-12-12: 50 ug/min via INTRAVENOUS
  Filled 2012-12-11: qty 250

## 2012-12-11 MED ORDER — ALBUTEROL SULFATE (5 MG/ML) 0.5% IN NEBU
5.0000 mg | INHALATION_SOLUTION | Freq: Once | RESPIRATORY_TRACT | Status: AC
  Administered 2012-12-11: 5 mg via RESPIRATORY_TRACT
  Filled 2012-12-11: qty 1

## 2012-12-11 MED ORDER — NITROGLYCERIN 0.4 MG SL SUBL
0.4000 mg | SUBLINGUAL_TABLET | SUBLINGUAL | Status: DC | PRN
  Administered 2012-12-12: 0.4 mg via SUBLINGUAL
  Filled 2012-12-11: qty 25

## 2012-12-11 MED ORDER — ALBUTEROL SULFATE (5 MG/ML) 0.5% IN NEBU
5.0000 mg | INHALATION_SOLUTION | RESPIRATORY_TRACT | Status: DC
  Administered 2012-12-12 (×3): 5 mg via RESPIRATORY_TRACT
  Filled 2012-12-11 (×2): qty 0.5
  Filled 2012-12-11 (×2): qty 1

## 2012-12-11 MED ORDER — IPRATROPIUM BROMIDE 0.02 % IN SOLN
0.5000 mg | Freq: Once | RESPIRATORY_TRACT | Status: AC
  Administered 2012-12-11: 0.5 mg via RESPIRATORY_TRACT
  Filled 2012-12-11: qty 2.5

## 2012-12-11 NOTE — ED Notes (Signed)
Pt postpartum 09/25/2012

## 2012-12-11 NOTE — ED Notes (Signed)
Increased shortness of breath for a few wks; worse today; speaking in phrases; sats 88 on arrival; cough x 2 wks; increased swelling lower legs; states has been sleeping on four pillows at home

## 2012-12-12 ENCOUNTER — Encounter (HOSPITAL_COMMUNITY): Payer: 59

## 2012-12-12 ENCOUNTER — Emergency Department (HOSPITAL_COMMUNITY): Payer: 59

## 2012-12-12 DIAGNOSIS — I10 Essential (primary) hypertension: Secondary | ICD-10-CM

## 2012-12-12 DIAGNOSIS — J9600 Acute respiratory failure, unspecified whether with hypoxia or hypercapnia: Secondary | ICD-10-CM

## 2012-12-12 DIAGNOSIS — I059 Rheumatic mitral valve disease, unspecified: Secondary | ICD-10-CM

## 2012-12-12 DIAGNOSIS — I5033 Acute on chronic diastolic (congestive) heart failure: Principal | ICD-10-CM

## 2012-12-12 DIAGNOSIS — I169 Hypertensive crisis, unspecified: Secondary | ICD-10-CM

## 2012-12-12 LAB — BASIC METABOLIC PANEL
BUN: 21 mg/dL (ref 6–23)
CO2: 27 mEq/L (ref 19–32)
Chloride: 104 mEq/L (ref 96–112)
Glucose, Bld: 152 mg/dL — ABNORMAL HIGH (ref 70–99)
Potassium: 2.8 mEq/L — ABNORMAL LOW (ref 3.5–5.1)
Sodium: 144 mEq/L (ref 135–145)

## 2012-12-12 LAB — URINALYSIS, ROUTINE W REFLEX MICROSCOPIC
Bilirubin Urine: NEGATIVE
Glucose, UA: NEGATIVE mg/dL
Hgb urine dipstick: NEGATIVE
Specific Gravity, Urine: 1.01 (ref 1.005–1.030)
Urobilinogen, UA: 0.2 mg/dL (ref 0.0–1.0)
pH: 6 (ref 5.0–8.0)

## 2012-12-12 LAB — CBC WITH DIFFERENTIAL/PLATELET
Basophils Relative: 1 % (ref 0–1)
Eosinophils Absolute: 0.2 10*3/uL (ref 0.0–0.7)
Hemoglobin: 11 g/dL — ABNORMAL LOW (ref 12.0–15.0)
Lymphs Abs: 2.8 10*3/uL (ref 0.7–4.0)
MCH: 27.1 pg (ref 26.0–34.0)
MCHC: 31.5 g/dL (ref 30.0–36.0)
Monocytes Relative: 6 % (ref 3–12)
Neutro Abs: 3.8 10*3/uL (ref 1.7–7.7)
Neutrophils Relative %: 52 % (ref 43–77)
Platelets: 274 10*3/uL (ref 150–400)
RBC: 4.06 MIL/uL (ref 3.87–5.11)

## 2012-12-12 LAB — COMPREHENSIVE METABOLIC PANEL
ALT: 81 U/L — ABNORMAL HIGH (ref 0–35)
AST: 83 U/L — ABNORMAL HIGH (ref 0–37)
Albumin: 3.1 g/dL — ABNORMAL LOW (ref 3.5–5.2)
Alkaline Phosphatase: 61 U/L (ref 39–117)
BUN: 23 mg/dL (ref 6–23)
Chloride: 106 mEq/L (ref 96–112)
Potassium: 4.2 mEq/L (ref 3.5–5.1)
Sodium: 143 mEq/L (ref 135–145)
Total Bilirubin: 1.1 mg/dL (ref 0.3–1.2)
Total Protein: 6.4 g/dL (ref 6.0–8.3)

## 2012-12-12 LAB — POCT I-STAT TROPONIN I: Troponin i, poc: 0.04 ng/mL (ref 0.00–0.08)

## 2012-12-12 MED ORDER — FUROSEMIDE 10 MG/ML IJ SOLN
80.0000 mg | Freq: Two times a day (BID) | INTRAMUSCULAR | Status: DC
  Administered 2012-12-12 (×2): 80 mg via INTRAVENOUS
  Filled 2012-12-12 (×2): qty 8
  Filled 2012-12-12: qty 4
  Filled 2012-12-12: qty 8
  Filled 2012-12-12: qty 4

## 2012-12-12 MED ORDER — ONDANSETRON HCL 4 MG/2ML IJ SOLN: 4.0000 mg | Freq: Three times a day (TID) | INTRAMUSCULAR | Status: AC | PRN

## 2012-12-12 MED ORDER — POTASSIUM CHLORIDE CRYS ER 20 MEQ PO TBCR
20.0000 meq | EXTENDED_RELEASE_TABLET | Freq: Two times a day (BID) | ORAL | Status: DC
  Administered 2012-12-13 – 2012-12-14 (×3): 20 meq via ORAL
  Filled 2012-12-12 (×4): qty 1

## 2012-12-12 MED ORDER — SODIUM CHLORIDE 0.9 % IV SOLN
INTRAVENOUS | Status: DC
  Administered 2012-12-12: 06:00:00 via INTRAVENOUS

## 2012-12-12 MED ORDER — ONDANSETRON HCL 4 MG/2ML IJ SOLN
4.0000 mg | Freq: Once | INTRAMUSCULAR | Status: AC
  Administered 2012-12-12: 4 mg via INTRAVENOUS
  Filled 2012-12-12: qty 2

## 2012-12-12 MED ORDER — ENOXAPARIN SODIUM 40 MG/0.4ML ~~LOC~~ SOLN
40.0000 mg | Freq: Every day | SUBCUTANEOUS | Status: DC
  Administered 2012-12-12 – 2012-12-13 (×2): 40 mg via SUBCUTANEOUS
  Filled 2012-12-12 (×3): qty 0.4

## 2012-12-12 MED ORDER — ALBUTEROL SULFATE (5 MG/ML) 0.5% IN NEBU
2.5000 mg | INHALATION_SOLUTION | Freq: Two times a day (BID) | RESPIRATORY_TRACT | Status: DC
  Administered 2012-12-12 – 2012-12-13 (×3): 2.5 mg via RESPIRATORY_TRACT
  Filled 2012-12-12 (×3): qty 0.5

## 2012-12-12 MED ORDER — POTASSIUM CHLORIDE CRYS ER 20 MEQ PO TBCR
40.0000 meq | EXTENDED_RELEASE_TABLET | ORAL | Status: AC
  Administered 2012-12-12 (×3): 40 meq via ORAL
  Filled 2012-12-12: qty 1
  Filled 2012-12-12 (×3): qty 2

## 2012-12-12 MED ORDER — HYDRALAZINE HCL 50 MG PO TABS
50.0000 mg | ORAL_TABLET | Freq: Three times a day (TID) | ORAL | Status: DC
  Administered 2012-12-12 – 2012-12-14 (×7): 50 mg via ORAL
  Filled 2012-12-12 (×10): qty 1

## 2012-12-12 MED ORDER — NITROGLYCERIN IN D5W 200-5 MCG/ML-% IV SOLN: 2.0000 ug/min | Freq: Once | INTRAVENOUS | Status: DC

## 2012-12-12 MED ORDER — NITROGLYCERIN IN D5W 200-5 MCG/ML-% IV SOLN
50.0000 ug/min | Freq: Once | INTRAVENOUS | Status: AC
  Administered 2012-12-12: 50 ug/min via INTRAVENOUS
  Filled 2012-12-12: qty 250

## 2012-12-12 MED ORDER — ACETAMINOPHEN 325 MG PO TABS
650.0000 mg | ORAL_TABLET | Freq: Four times a day (QID) | ORAL | Status: DC | PRN
  Administered 2012-12-12 – 2012-12-13 (×3): 650 mg via ORAL
  Filled 2012-12-12 (×2): qty 2

## 2012-12-12 MED ORDER — ALBUTEROL SULFATE (5 MG/ML) 0.5% IN NEBU: 2.5000 mg | INHALATION_SOLUTION | RESPIRATORY_TRACT | Status: AC | PRN

## 2012-12-12 MED ORDER — NITROGLYCERIN IN D5W 200-5 MCG/ML-% IV SOLN
2.0000 ug/min | INTRAVENOUS | Status: DC
  Administered 2012-12-12: 200 ug/min via INTRAVENOUS
  Filled 2012-12-12 (×2): qty 250

## 2012-12-12 MED ORDER — IPRATROPIUM BROMIDE 0.02 % IN SOLN
0.5000 mg | Freq: Two times a day (BID) | RESPIRATORY_TRACT | Status: DC
  Administered 2012-12-12 – 2012-12-13 (×3): 0.5 mg via RESPIRATORY_TRACT
  Filled 2012-12-12 (×3): qty 2.5

## 2012-12-12 MED ORDER — LABETALOL HCL 300 MG PO TABS
300.0000 mg | ORAL_TABLET | Freq: Three times a day (TID) | ORAL | Status: DC
  Administered 2012-12-12 – 2012-12-13 (×4): 300 mg via ORAL
  Filled 2012-12-12 (×6): qty 1

## 2012-12-12 NOTE — ED Provider Notes (Signed)
History     CSN: [id number]  Arrival date & time 12/11/12  2324   First MD Initiated Contact with Patient 12/11/12 2356      Chief Complaint  Patient presents with  . Shortness of Breath   HPI [name] is a 37 y.o. female with hypertension and a recent pertinent medical history of preeclampsia having given birth at 27 weeks on 11/25/2012, patient initially started as a vaginal birth and do breech was delivered cesarean section. She says over the course of the last 2 weeks she's had progressive orthopnea, patient now sleeps with 45 kg and is occasionally getting up to sleep in a chair which is new, she has a history of heart failure, last ejection fraction from echocardiogram 08/03/12 showed ejection fraction 40-45% with mild to moderately dilated left ventricle and wall thickness consistent with moderate LVH and mild mitral regurg, she follows with Dr. Jones Broom for that. Patient had an episode of severe or failure for which she had a pacemaker ICD placed in 2007.  She does have some epigastric discomfort, she complains about early satiety, she says this is been going on for about the same amount of time than a respiratory samples have.   Past Medical History  Diagnosis Date  . Hypertension     currently on Labetalol 200mg  BID  . Infection     UTI;not frequent;last infection was in June  . Infection     Yeast;not frequent  . Asthma     Inhaler prn;triggered by seasonal changes mostly cold weathert;last asthma  attack years ago  . CHF (congestive heart failure) 01/2006    Has Pacemaker and Defibrillator;was on heart meds;no longer taking since pregancy  . Anemia     Iron supplements in the past  . Migraines     Can't take Procardia XL, causes migraines  . Varicose veins 1998    Developed during last pregnancy  . Other primary cardiomyopathies   . Atrial fibrillation   . Paroxysmal ventricular tachycardia   . CHF (congestive heart failure)   . Hypertension   . Acute bronchitis  and bronchiolitis     Past Surgical History  Procedure Laterality Date  . Cesarean section  1998    d/t preeclampsia  . Breast reduction surgery  03/05/1994    d/t back, shoulder, and neck pain (42 F)  . Insert / replace / remove pacemaker   02/10/2006    Status post implantable cardioverter-defibrillator insertion  .Marland KitchenMarland KitchenMarland KitchenThe Medtronic Maximo VR, model 7232, single chamber  . Cardiac catheterization      EF 30-35%  . Cardiac defibrillator placement      Family History  Problem Relation Age of Onset  . Sickle cell trait Daughter   . Other Father     Varicose veins  . Asthma Daughter   . Seizures Maternal Uncle   . Migraines Mother   . Migraines Daughter   . Migraines Cousin     Maternal  . Cancer Paternal Grandmother     Stomach  . Cancer Maternal Uncle     Prostate  . Cancer Maternal Uncle     Lung  . Ulcers Mother   . Lupus Maternal Aunt   . Hypertension Father   . Hypertension Paternal Aunt   . Other Daughter     blood transfusion @ birth;platelets were low  . Rheum arthritis Mother   . Diabetes Father     Controlled w/ diet and exercise  . Diabetes Maternal Aunt   .  Diabetes Paternal Aunt     x 2  . Diabetes Paternal Uncle   . Hypertension    . Heart disease      History  Substance Use Topics  . Smoking status: Never Smoker   . Smokeless tobacco: Never Used  . Alcohol Use: No    OB History   Grav Para Term Preterm Abortions TAB SAB Ect Mult Living   2 1  1      1       Review of Systems At least 10pt or greater review of systems completed and are negative except where specified in the HPI.  Allergies  Oxycodone-acetaminophen and Percocet  Home Medications   Current Outpatient Rx  Name  Route  Sig  Dispense  Refill  . albuterol (PROVENTIL HFA;VENTOLIN HFA) 108 (90 BASE) MCG/ACT inhaler   Inhalation   Inhale 2 puffs into the lungs every 6 (six) hours as needed. Rescue inhaler         . aspirin 81 MG tablet   Oral   Take 81 mg by mouth  daily.         . cetirizine (ZYRTEC) 10 MG tablet   Oral   Take 10 mg by mouth daily.         . Cholecalciferol (VITAMIN D) 2000 UNITS tablet   Oral   Take 2,000 Units by mouth daily.          . furosemide (LASIX) 40 MG tablet   Oral   Take 40 mg by mouth daily.         . hydrALAZINE (APRESOLINE) 100 MG tablet   Oral   Take 1 tablet (100 mg total) by mouth 3 (three) times daily.   90 tablet   3     Please discontinue all other hydralazine prescript ...   . labetalol (NORMODYNE) 300 MG tablet   Oral   Take 600 mg by mouth 3 (three) times daily.         . Magnesium-Zinc (MAGNESIUM-CHELATED ZINC) 133.33-5 MG TABS   Oral   Take 1 tablet by mouth daily.         Marland Kitchen omega-3 acid ethyl esters (LOVAZA) 1 G capsule   Oral   Take 1 g by mouth daily.         . potassium chloride SA (K-DUR,KLOR-CON) 20 MEQ tablet   Oral   Take 1 tablet (20 mEq total) by mouth 2 (two) times daily.   60 tablet   6   . Prenatal Vit-Fe Fumarate-FA (PRENATAL MULTIVITAMIN) TABS   Oral   Take 1 tablet by mouth daily at 12 noon.         . vitamin C (ASCORBIC ACID) 500 MG tablet   Oral   Take 500 mg by mouth daily.           BP 181/122  Pulse 114  Temp(Src) 98.1 F (36.7 C)  Resp 34  SpO2 100%  LMP 12/01/2012  Breastfeeding? Unknown  Physical Exam  Nursing notes reviewed.  Electronic medical record reviewed. VITAL SIGNS:   Filed Vitals:   12/13/12 0200 12/13/12 0300 12/13/12 0400 12/13/12 0505  BP: 131/67 119/81 137/82 137/88  Pulse:      Temp:   98.4 F (36.9 C)   TempSrc:   Oral   Resp: 14 16 14 18   Height:      Weight:      SpO2:   98%    CONSTITUTIONAL: Awake, oriented, speaking in short phrases, diaphoretic,  respiratory distress HENT: Atraumatic, normocephalic, oral mucosa pink and moist, airway patent. Nares patent without drainage. External ears normal. EYES: Conjunctiva clear, EOMI, PERRLA NECK: Trachea midline, non-tender, supple CARDIOVASCULAR:  Tachycardic, Normal rhythm, No murmurs, rubs, gallops; hypertensive to 190/100 PULMONARY/CHEST: Rales bilaterally, decreased breath sounds bilaterally. Non-tender. ABDOMINAL: Non-distended, soft, non-tender - no rebound or guarding.  BS normal. NEUROLOGIC: Non-focal, moving all four extremities, no gross sensory or motor deficits. EXTREMITIES: No clubbing, cyanosis, or edema SKIN: Warm, Dry, No erythema, No rash  ED Course  Procedures (including critical care time)  Date: 12/12/2012  Rate: 103  Rhythm: Sinus tachycardia  QRS Axis: normal  Intervals: normal  ST/T Wave abnormalities: normal  Conduction Disutrbances: none  Narrative Interpretation: Sinus tachycardia  CRITICAL CARE Performed by: Jones Skene Total critical care time: 60 Respiratory failure, hypertensive emergency Critical care time was exclusive of separately billable procedures and treating other patients. Critical care was necessary to treat or prevent imminent or life-threatening deterioration. Critical care was time spent personally by me on the following activities: development of treatment plan with patient and/or surrogate as well as nursing, discussions with consultants, evaluation of patient's response to treatment, examination of patient, obtaining history from patient or surrogate, ordering and performing treatments and interventions, ordering and review of laboratory studies, ordering and review of radiographic studies, pulse oximetry and re-evaluation of patient's condition.    Labs Reviewed  CBC WITH DIFFERENTIAL - Abnormal; Notable for the following:    Hemoglobin 11.0 (*)    HCT 34.9 (*)    RDW 17.9 (*)    All other components within normal limits  COMPREHENSIVE METABOLIC PANEL - Abnormal; Notable for the following:    Glucose, Bld 122 (*)    Creatinine, Ser 1.18 (*)    Albumin 3.1 (*)    AST 83 (*)    ALT 81 (*)    GFR calc non Af Amer 58 (*)    GFR calc Af Amer 67 (*)    All other components  within normal limits  PRO B NATRIURETIC PEPTIDE - Abnormal; Notable for the following:    Pro B Natriuretic peptide (BNP) 3902.0 (*)    All other components within normal limits  BASIC METABOLIC PANEL - Abnormal; Notable for the following:    Potassium 2.8 (*)    Glucose, Bld 152 (*)    Creatinine, Ser 1.31 (*)    Calcium 8.2 (*)    GFR calc non Af Amer 51 (*)    GFR calc Af Amer 59 (*)    All other components within normal limits  BASIC METABOLIC PANEL - Abnormal; Notable for the following:    Creatinine, Ser 1.55 (*)    Calcium 7.9 (*)    GFR calc non Af Amer 42 (*)    GFR calc Af Amer 48 (*)    All other components within normal limits  MRSA PCR SCREENING  CULTURE, BLOOD (ROUTINE X 2)  MAGNESIUM  URINALYSIS, ROUTINE W REFLEX MICROSCOPIC  POCT I-STAT TROPONIN I  CG4 I-STAT (LACTIC ACID)   Dg Chest Port 1 View  12/12/2012   *RADIOLOGY REPORT*  Clinical Data: Short of breath.  PORTABLE CHEST - 1 VIEW  Comparison: 09/10/2009  Findings: AICD remains in place.  Cardiomegaly again noted.  New diffuse interstitial infiltrates seen, consistent with interstitial edema.  No evidence of pulmonary consolidation.  IMPRESSION: Acute congestive heart failure with diffuse interstitial edema.   Original Report Authenticated By: Myles Rosenthal, M.D.     1. Acute exacerbation  of CHF (congestive heart failure)   2. Shortness of breath   3. Tachycardia   4. Bilateral lower extremity edema   5. Acute on chronic diastolic heart failure   6. Acute respiratory failure   7. Hypertensive crisis    Medications  nitroGLYCERIN (NITROSTAT) SL tablet 0.4 mg (0.4 mg Sublingual Given 12/12/12 0005)  albuterol (PROVENTIL) (5 MG/ML) 0.5% nebulizer solution 2.5 mg (not administered)  ondansetron (ZOFRAN) injection 4 mg (not administered)  0.9 %  sodium chloride infusion ( Intravenous Stopped 12/13/12 0510)  nitroGLYCERIN 0.2 mg/mL in dextrose 5 % infusion (250 mcg/min Intravenous Rate/Dose Verify 12/12/12 0615)   acetaminophen (TYLENOL) tablet 650 mg (650 mg Oral Given 12/12/12 2130)  nitroGLYCERIN 0.2 mg/mL in dextrose 5 % infusion (0 mcg/min Intravenous Stopped 12/13/12 0510)  hydrALAZINE (APRESOLINE) tablet 50 mg (50 mg Oral Given 12/13/12 0508)  labetalol (NORMODYNE) tablet 300 mg (300 mg Oral Given 12/12/12 2118)  enoxaparin (LOVENOX) injection 40 mg (40 mg Subcutaneous Given 12/12/12 0932)  albuterol (PROVENTIL) (5 MG/ML) 0.5% nebulizer solution 2.5 mg (2.5 mg Nebulization Given 12/12/12 2100)  ipratropium (ATROVENT) nebulizer solution 0.5 mg (0.5 mg Nebulization Given 12/12/12 2100)  potassium chloride SA (K-DUR,KLOR-CON) CR tablet 20 mEq (not administered)  isosorbide mononitrate (IMDUR) 24 hr tablet 30 mg (not administered)  albuterol (PROVENTIL) (5 MG/ML) 0.5% nebulizer solution 5 mg (5 mg Nebulization Given 12/11/12 2336)  ipratropium (ATROVENT) nebulizer solution 0.5 mg (0.5 mg Nebulization Given 12/11/12 2336)  nitroGLYCERIN 0.2 mg/mL in dextrose 5 % infusion (0 mcg/min Intravenous Stopped 12/12/12 0444)  furosemide (LASIX) injection 80 mg (80 mg Intravenous Given 12/12/12 0006)  ondansetron (ZOFRAN) injection 4 mg (4 mg Intravenous Given 12/12/12 0009)  nitroGLYCERIN 0.2 mg/mL in dextrose 5 % infusion (50 mcg/min Intravenous New Bag/Given 12/12/12 0445)  potassium chloride SA (K-DUR,KLOR-CON) CR tablet 40 mEq (40 mEq Oral Given 12/12/12 2119)      MDM  SAPPHIRE TYGART is a 37 y.o. female who recently had a child in 5/2, she also carries a history of congestive heart failure, last echocardiogram showed an EF of 42% done in January of this year.  Differential at this time includes acute exacerbation of current congestive heart failure as well as postpartum cardiomyopathy. I think an exacerbation of her CHF was most likely this point, patient has extreme hypertension at this time, Treated the patient aggressively with BiPAP, sublingual nitroglycerin and IV nitroglycerin, IV Lasix.  No changes on EKG to  signify ischemia or infarction.  On reevaluation, patient is responding well to preload reduction and BiPAP. Patient is breathing much easier, heart rate has slowed down. Labs show multiple abnormalities including hypokalemia, renal insufficiency.  Chest x-ray, is quite clear the patient has acute congestive heart failure with diffuse interstitial edema.  Patient also has some transaminitis, AST and ALT are 83 and 81 respectively, patient's platelet count however is 274.   Do not think this is the eclamptic picture, think she's got a hypertensive emergency, pulmonary edema and will require admission by the cardiologist for diuresis and further evaluation.  Discussed with cardiac fellow, will transfer to [facility].            Jones Skene, MD 12/13/12 [phone]

## 2012-12-12 NOTE — ED Notes (Signed)
Paged Dr. Lavonna Rua in regards to pt bed request to be transferred to [facility]. Dr stated that he did not do the request and that it was for the admitting dr to complete. Dr asked if pt was a consult or admit. Replied that pt was being admitted per dr bonk. Dr. Rulon Abide made aware of this and is to complete pt bed request.

## 2012-12-12 NOTE — Care Management Note (Addendum)
    Page 1 of 1   12/13/2012     11:21:51 AM   CARE MANAGEMENT NOTE 12/13/2012  Patient:  [name], [name]   Account Number:  [id number]  Date Initiated:  12/12/2012  Documentation initiated by:  Junius Creamer  Subjective/Objective Assessment:   adm w chf     Action/Plan:   lives w fam, pcp dr Cala Bradford [place]   Anticipated DC Date:     Anticipated DC Plan:  HOME/SELF CARE      DC Planning Services  CM consult      Choice offered to / List presented to:             Status of service:   Medicare Important Message given?   (If response is "NO", the following Medicare IM given date fields will be blank) Date Medicare IM given:   Date Additional Medicare IM given:    Discharge Disposition:  HOME/SELF CARE  Per UR Regulation:  Reviewed for med. necessity/level of care/duration of stay  If discussed at Long Length of Stay Meetings, dates discussed:    Comments:  5/20 1119a debbie dowellrn,bsn spoke w pt, she has prim care phy dr Renae Gloss. pt has ins thru [employer] [facility]. she had been out on maternity leave and has returned so her ins should be in effect she states. did give pt 2 prescription discount cards in case has brand name meds.

## 2012-12-12 NOTE — ED Notes (Signed)
Called lab to update on blood cultures. Unable to get another set of blood cultures. Lab notified. Will have someone else look for site.

## 2012-12-12 NOTE — Progress Notes (Signed)
  Afternoon rounds.  Diuresing well. BP better. EF 35% on echo.  Continue diuresis. Adjust HF meds.  Truman Hayward 5:45 PM

## 2012-12-12 NOTE — H&P (Signed)
HPI: [name] is a 37 year old morbidly obese woman with h/o chronic systolic heart failure due to nonischemic cardiomyopathy (previously 25% in 2007 but now with recovered EF) s/p ICD implantation, hypertension, and obesity. Cardiac Cath 2007 -Findings consistent with nonischemic, mildly dilated cardiomyopathy.   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 Last year she got pregnant again with her 2nd child. She was followed at the high risk clinic at [facility] and the HF clinic but referred back to the HF clinic after she gave birth to her 2nd child on March 2.  EF in January 2014 was 40-45% but reportedly was 50% after delivery,  She last seen in the HF clinic 11/10/12 and she continued on labetalol 600 mg tid and hydralazine was increased to 100 mg tid. Weight at that time was 227 pounds. She says her blood pressure did not improve at home and remained > 160.She stopped breast feeding 3 weeks ago.   Over the last 2 weeks she has complained of increased dyspnea with exertion, orthopnea -sleeping on 4 pillows, and PND. Weight at home trending up from 216 to 224 pounds. Stopped taking her meds over the past few days as throat felt tight due to her edema. She then tried to increase lasix but she had poor response.   She presented to [facility] ED with dyspnea. She was given 80  mg IV lasix and Nitroglycerin drip started. Nitroglycerin drip has titrated up to 60 mcg. Pertinent admission labs included: Potassium 4.2, creatinine 1.18, WBC 7.4, lactic acid 1.76 and Pro BNP 3902. Admit weight 223 pounds  Dyspnea improved after one dose lasix.    ECHOs 04/27/2011 45-50%.  08/20/11 ECHO 55-60%  05/19/12 EF ~40%  07/01/12 EF ~40%  08/02/12 EF~40%  2/14 EF ~50%   Review of Systems:     Cardiac Review of Systems: {Y] = yes [ ]  = no  Chest Pain [    ]  Resting SOB [   ] Exertional SOB  [Y  ]  Orthopnea Gilian.Kraft  ]   Pedal Edema [ Y  ]     Palpitations [  ] Syncope  [  ]   Presyncope [   ]  General Review of Systems: [Y] = yes [  ]=no Constitional: recent weight change [  ]; anorexia [  ]; fatigue [ Y ]; nausea [  ]; night sweats [  ]; fever [  ]; or chills [  ];                                                                                                                                          Dental: poor dentition[  ]; Last Dentist visit:   Eye : blurred vision [  ]; diplopia [   ]; vision changes [  ];  Amaurosis  fugax[  ]; Resp: cough [Y ];  wheezing[  ];  hemoptysis[  ]; shortness of breath[Y  ]; paroxysmal nocturnal dyspnea[ Y ]; dyspnea on exertion[Y  ]; or orthopnea[Y  ];  GI:  gallstones[  ], vomiting[  ];  dysphagia[  ]; melena[  ];  hematochezia [  ]; heartburn[  ];    GU: kidney stones [  ]; hematuria[  ];   dysuria [  ];  nocturia[  ];  history of     obstruction [  ];                 Skin: rash, swelling[  ];, hair loss[  ];  peripheral edema[  ];  or itching[  ]; Musculosketetal: myalgias[  ];  joint swelling[  ];  joint erythema[  ];  joint pain[  ];  back pain[  ];  Heme/Lymph: bruising[  ];  bleeding[  ];  anemia[  ];  Neuro: TIA[  ];  headaches[  ];  stroke[  ];  vertigo[  ];  seizures[  ];   paresthesias[  ];  difficulty walking[  ];  Psych:depression[  ]; anxiety[  ];  Endocrine: diabetes[  ];  thyroid dysfunction[  ];  Immunizations: Flu [  ]; Pneumococcal[  ];  Other:  Past Medical History  Diagnosis Date  . Hypertension     currently on Labetalol 200mg  BID  . Infection     UTI;not frequent;last infection was in June  . Infection     Yeast;not frequent  . Asthma     Inhaler prn;triggered by seasonal changes mostly cold weathert;last asthma  attack years ago  . CHF (congestive heart failure) 01/2006    Has Pacemaker and Defibrillator;was on heart meds;no longer taking since pregancy  . Anemia     Iron supplements in the past  . Migraines     Can't take Procardia XL, causes migraines  .  Varicose veins 1998    Developed during last pregnancy  . Other primary cardiomyopathies   . Atrial fibrillation   . Paroxysmal ventricular tachycardia   . CHF (congestive heart failure)   . Hypertension   . Acute bronchitis and bronchiolitis     Medications Prior to Admission  Medication Sig Dispense Refill  . albuterol (PROVENTIL HFA;VENTOLIN HFA) 108 (90 BASE) MCG/ACT inhaler Inhale 2 puffs into the lungs every 6 (six) hours as needed. Rescue inhaler      . aspirin 81 MG tablet Take 81 mg by mouth daily.      . cetirizine (ZYRTEC) 10 MG tablet Take 10 mg by mouth daily.      . Cholecalciferol (VITAMIN D) 2000 UNITS tablet Take 2,000 Units by mouth daily.       . furosemide (LASIX) 40 MG tablet Take 40 mg by mouth daily.      . hydrALAZINE (APRESOLINE) 100 MG tablet Take 1 tablet (100 mg total) by mouth 3 (three) times daily.  90 tablet  3  . labetalol (NORMODYNE) 300 MG tablet Take 600 mg by mouth 3 (three) times daily.      . Magnesium-Zinc (MAGNESIUM-CHELATED ZINC) 133.33-5 MG TABS Take 1 tablet by mouth daily.      Marland Kitchen omega-3 acid ethyl esters (LOVAZA) 1 G capsule Take 1 g by mouth daily.      . potassium chloride SA (K-DUR,KLOR-CON) 20 MEQ tablet Take 1 tablet (20 mEq total) by mouth 2 (two) times daily.  60 tablet  6  . Prenatal Vit-Fe Fumarate-FA (  PRENATAL MULTIVITAMIN) TABS Take 1 tablet by mouth daily at 12 noon.      . vitamin C (ASCORBIC ACID) 500 MG tablet Take 500 mg by mouth daily.         Allergies  Allergen Reactions  . Oxycodone-Acetaminophen Shortness Of Breath  . Percocet (Oxycodone-Acetaminophen) Shortness Of Breath    History   Social History  . Marital Status: Legally Separated    Spouse Name: [name]    Number of Children: 1  . Years of Education: 15   Occupational History  . 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 [employer]   Social History Main Topics  . Smoking status: Never Smoker   . Smokeless tobacco: Never Used  . Alcohol  Use: No  . Drug Use: No  . Sexually Active: Yes -- Female partner(s)    Birth Control/ Protection: Condom, Pill, None   Other Topics Concern  . Not on file   Social History Narrative   ** Merged History Encounter **        Family History  Problem Relation Age of Onset  . Sickle cell trait Daughter   . Other Father     Varicose veins  . Asthma Daughter   . Seizures Maternal Uncle   . Migraines Mother   . Migraines Daughter   . Migraines Cousin     Maternal  . Cancer Paternal Grandmother     Stomach  . Cancer Maternal Uncle     Prostate  . Cancer Maternal Uncle     Lung  . Ulcers Mother   . Lupus Maternal Aunt   . Hypertension Father   . Hypertension Paternal Aunt   . Other Daughter     blood transfusion @ birth;platelets were low  . Rheum arthritis Mother   . Diabetes Father     Controlled w/ diet and exercise  . Diabetes Maternal Aunt   . Diabetes Paternal Aunt     x 2  . Diabetes Paternal Uncle   . Hypertension    . Heart disease      PHYSICAL EXAM: Filed Vitals:   12/12/12 0700  BP: 177/105  Pulse: 90  Temp:   Resp: 31   General:  On nebs. No respiratory difficulty HEENT: normal Neck: supple. Thick neck difficult to assess but appears elevated to jaw  JVD. Carotids 2+ bilat; no bruits. No lymphadenopathy or thryomegaly appreciated. Cor: PMI nondisplaced. Regular rate & rhythm. No rubs or murmurs. +s4 Lungs: clear Abdomen: obese soft, nontender, nondistended. No hepatosplenomegaly. No bruits or masses. Good bowel sounds. Extremities: no cyanosis, clubbing, rash, R and LLE 1-2+ edema Neuro: alert & oriented x 3, cranial nerves grossly intact. moves all 4 extremities w/o difficulty. Affect pleasant.  ECG: ST 103 LVH. NSSTs  Results for orders placed during the hospital encounter of 12/11/12 (from the past 24 hour(s))  CBC WITH DIFFERENTIAL     Status: Abnormal   Collection Time    12/11/12 11:42 PM      Result Value Range   WBC 7.4  4.0 - 10.5 K/uL    RBC 4.06  3.87 - 5.11 MIL/uL   Hemoglobin 11.0 (*) 12.0 - 15.0 g/dL   HCT 16.1 (*) 09.6 - 04.5 %   MCV 86.0  78.0 - 100.0 fL   MCH 27.1  26.0 - 34.0 pg   MCHC 31.5  30.0 - 36.0 g/dL   RDW 40.9 (*) 81.1 - 91.4 %   Platelets 274  150 - 400 K/uL   Neutrophils Relative % 52  43 - 77 %   Neutro Abs 3.8  1.7 - 7.7 K/uL   Lymphocytes Relative 39  12 - 46 %   Lymphs Abs 2.8  0.7 - 4.0 K/uL   Monocytes Relative 6  3 - 12 %   Monocytes Absolute 0.5  0.1 - 1.0 K/uL   Eosinophils Relative 3  0 - 5 %   Eosinophils Absolute 0.2  0.0 - 0.7 K/uL   Basophils Relative 1  0 - 1 %   Basophils Absolute 0.1  0.0 - 0.1 K/uL  COMPREHENSIVE METABOLIC PANEL     Status: Abnormal   Collection Time    12/11/12 11:42 PM      Result Value Range   Sodium 143  135 - 145 mEq/L   Potassium 4.2  3.5 - 5.1 mEq/L   Chloride 106  96 - 112 mEq/L   CO2 24  19 - 32 mEq/L   Glucose, Bld 122 (*) 70 - 99 mg/dL   BUN 23  6 - 23 mg/dL   Creatinine, Ser 1.61 (*) 0.50 - 1.10 mg/dL   Calcium 8.8  8.4 - 09.6 mg/dL   Total Protein 6.4  6.0 - 8.3 g/dL   Albumin 3.1 (*) 3.5 - 5.2 g/dL   AST 83 (*) 0 - 37 U/L   ALT 81 (*) 0 - 35 U/L   Alkaline Phosphatase 61  39 - 117 U/L   Total Bilirubin 1.1  0.3 - 1.2 mg/dL   GFR calc non Af Amer 58 (*) >90 mL/min   GFR calc Af Amer 67 (*) >90 mL/min  PRO B NATRIURETIC PEPTIDE     Status: Abnormal   Collection Time    12/11/12 11:42 PM      Result Value Range   Pro B Natriuretic peptide (BNP) 3902.0 (*) 0 - 125 pg/mL  MAGNESIUM     Status: None   Collection Time    12/11/12 11:57 PM      Result Value Range   Magnesium 1.9  1.5 - 2.5 mg/dL  POCT I-STAT TROPONIN I     Status: None   Collection Time    12/12/12 12:04 AM      Result Value Range   Troponin i, poc 0.04  0.00 - 0.08 ng/mL   Comment 3           CG4 I-STAT (LACTIC ACID)     Status: None   Collection Time    12/12/12 12:16 AM      Result Value Range   Lactic Acid, Venous 1.76  0.5 - 2.2 mmol/L  URINALYSIS, ROUTINE W  REFLEX MICROSCOPIC     Status: None   Collection Time    12/12/12  2:39 AM      Result Value Range   Color, Urine YELLOW  YELLOW   APPearance CLEAR  CLEAR   Specific Gravity, Urine 1.010  1.005 - 1.030   pH 6.0  5.0 - 8.0   Glucose, UA NEGATIVE  NEGATIVE mg/dL   Hgb urine dipstick NEGATIVE  NEGATIVE   Bilirubin Urine NEGATIVE  NEGATIVE   Ketones, ur NEGATIVE  NEGATIVE mg/dL   Protein, ur NEGATIVE  NEGATIVE mg/dL   Urobilinogen, UA 0.2  0.0 - 1.0 mg/dL   Nitrite NEGATIVE  NEGATIVE   Leukocytes, UA NEGATIVE  NEGATIVE   Dg Chest Port 1 View  12/12/2012   *RADIOLOGY REPORT*  Clinical Data: Short of breath.  PORTABLE CHEST - 1 VIEW  Comparison: 09/10/2009  Findings: AICD remains in place.  Cardiomegaly again noted.  New diffuse interstitial infiltrates seen, consistent with interstitial edema.  No evidence of pulmonary consolidation.  IMPRESSION: Acute congestive heart failure with diffuse interstitial edema.   Original Report Authenticated By: Myles Rosenthal, M.D.     ASSESSMENT: 1. Hypertensive Crisis-  2. A/c diastolic HF 3. H/o Post partum Cardiomyopathy       -most recent EF 50% 08/2012       -S/P ICD  4. Morbid obesity 5. Noncompliance 6. Acute respiratory failure   PLAN/DISCUSSION:  She is admitted to SDU. Diurese with lasix 80 IV bid. Continue BP control with IV NTG and home meds. Would avoid b-blocker until resp status more stable. Check echo to assess EF. Will follow closely.   Advanced Heart Failure Team Pager [phone] (M-F; 7a - 4p)  Please contact [facility] for night-coverage after hours (4p -7a ) and weekends on [email or web address]

## 2012-12-12 NOTE — Progress Notes (Signed)
Echocardiogram 2D Echocardiogram has been performed.  [name] 12/12/2012, 10:55 AM

## 2012-12-13 DIAGNOSIS — I5023 Acute on chronic systolic (congestive) heart failure: Secondary | ICD-10-CM

## 2012-12-13 DIAGNOSIS — N179 Acute kidney failure, unspecified: Secondary | ICD-10-CM

## 2012-12-13 LAB — BASIC METABOLIC PANEL
BUN: 19 mg/dL (ref 6–23)
CO2: 30 mEq/L (ref 19–32)
Chloride: 104 mEq/L (ref 96–112)
Creatinine, Ser: 1.55 mg/dL — ABNORMAL HIGH (ref 0.50–1.10)
GFR calc Af Amer: 48 mL/min — ABNORMAL LOW (ref >90)
Glucose, Bld: 98 mg/dL (ref 70–99)
Potassium: 3.9 mEq/L (ref 3.5–5.1)

## 2012-12-13 MED ORDER — LOSARTAN POTASSIUM 25 MG PO TABS
25.0000 mg | ORAL_TABLET | Freq: Every day | ORAL | Status: DC
  Administered 2012-12-13 – 2012-12-14 (×2): 25 mg via ORAL
  Filled 2012-12-13 (×2): qty 1

## 2012-12-13 MED ORDER — CARVEDILOL 12.5 MG PO TABS
12.5000 mg | ORAL_TABLET | Freq: Two times a day (BID) | ORAL | Status: DC
Start: 2012-12-13 — End: 2012-12-14
  Administered 2012-12-13 – 2012-12-14 (×2): 12.5 mg via ORAL
  Filled 2012-12-13 (×4): qty 1

## 2012-12-13 MED ORDER — SPIRONOLACTONE 12.5 MG HALF TABLET
12.5000 mg | ORAL_TABLET | Freq: Every day | ORAL | Status: DC
  Administered 2012-12-13 – 2012-12-14 (×2): 12.5 mg via ORAL
  Filled 2012-12-13 (×2): qty 1

## 2012-12-13 MED ORDER — ISOSORBIDE MONONITRATE ER 30 MG PO TB24
30.0000 mg | ORAL_TABLET | Freq: Every day | ORAL | Status: DC
  Administered 2012-12-13 – 2012-12-14 (×2): 30 mg via ORAL
  Filled 2012-12-13 (×2): qty 1

## 2012-12-13 NOTE — Discharge Summary (Signed)
Advanced Heart Failure Team  Discharge Summary   Patient ID: [name] MRN: [id number], DOB/AGE: Nov 27, 1975 37 y.o. Admit date: 12/11/2012 D/C date:     12/14/2012   Primary Discharge Diagnoses:  1. Hypertensive Crisis-  2. A/c diastolic HF  3. H/o Post partum Cardiomyopathy  -most recent EF 50% 08/2012  -S/P ICD  4. Morbid obesity  5. Noncompliance  6. Acute respiratory failure - resolved  7. A/c renal failure - resolved discharge creatinine 1.1    Hospital Course:  [name] is a 37 year old morbidly obese woman with h/o chronic systolic heart failure due to nonischemic cardiomyopathy (previously 25% in 2007 but now with recovered EF) s/p ICD implantation, hypertension, and obesity. Cardiac Cath 2007 -Findings consistent with nonischemic, mildly dilated cardiomyopathy.  .  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 Last year she got pregnant  with her 2nd child. She was followed at the high risk clinic at [facility] and the HF clinic but referred back to the HF clinic after she gave birth to her 2nd child on March 2. EF in January 2014 was 40-45% but reportedly was 50% after delivery in March. Prior to admit she stopped taking HF meds for about 1 week due to weakness. She tried to increase lasix but she had a poor response and developed dyspnea.   She was admitted to [facility] 12/11/12 with increased dyspnea on exertion. She was given 80 mg IV lasix and Nitroglycerin drip was started due to hypertensive crisis. With the addition of po labetalol and hydralazine, Nitroglycerin was weaned off. SBP remained < 150. Repeat ECHO completed and EF is again reduced from 50% to 35%. Diuresed with IV lasix for 2 days and later stopped due to creatinine bump. Prior to discharge she was placed 80 mg of po lasix. Creatinine down to 1.18 on the day of discharge. She currently has no medical insurance therefore labetalol was switched to  12.5 mg of carvedilol bid to limit medication costs. Also losartan 25 mg, hydralazine 75 mg tid and IMDUR 60 mg daily added for afterload reduction. 30 day supply of HF medications provided at discharge. Plan to repeat in ECHO 3 months after HF meds optimized.   Lengthy discussions occurred regarding daily weights, low salt food choices, medication compliance, and limiting fluid intake < 2 liters. She will continue to be followed closely in the HF clinic with follow up 12/22/12 at 11:00 with a BMET.   Discharge Weight Range:  Discharg Discharge Vitals: Blood pressure 136/84, pulse 86, temperature 98.1 F (36.7 C), temperature source Oral, resp. rate 18, height 5' 1.5" (1.562 m), weight 222 lb 14.4 oz (101.107 kg), last menstrual period 12/01/2012, SpO2 96.00%, unknown if currently breastfeeding.  Labs: Lab Results  Component Value Date   WBC 7.4 12/11/2012   HGB 11.0* 12/11/2012   HCT 34.9* 12/11/2012   MCV 86.0 12/11/2012   PLT 274 12/11/2012    Recent Labs Lab 12/11/12 2342  12/14/12 0500  NA 143  < > 142  K 4.2  < > 3.8  CL 106  < > 105  CO2 24  < > 27  BUN 23  < > 19  CREATININE 1.18*  < > 1.18*  CALCIUM 8.8  < > 8.6  PROT 6.4  --   --   BILITOT 1.1  --   --   ALKPHOS 61  --   --   ALT 81*  --   --  AST 83*  --   --   GLUCOSE 122*  < > 104*  < > = values in this interval not displayed. No results found for this basename: CHOL,  HDL,  LDLCALC,  TRIG   BNP (last 3 results)  Recent Labs  12/11/12 2342  PROBNP 3902.0*    Diagnostic Studies/Procedures   No results found.  Discharge Medications     Medication List    STOP taking these medications       labetalol 300 MG tablet  Commonly known as:  NORMODYNE      TAKE these medications       albuterol 108 (90 BASE) MCG/ACT inhaler  Commonly known as:  PROVENTIL HFA;VENTOLIN HFA  Inhale 2 puffs into the lungs every 6 (six) hours as needed. Rescue inhaler     aspirin 81 MG tablet  Take 81 mg by mouth daily.      carvedilol 12.5 MG tablet  Commonly known as:  COREG  Take 1 tablet (12.5 mg total) by mouth 2 (two) times daily with a meal.     cetirizine 10 MG tablet  Commonly known as:  ZYRTEC  Take 10 mg by mouth daily.     furosemide 80 MG tablet  Commonly known as:  LASIX  Take 1 tablet (80 mg total) by mouth daily.     hydrALAZINE 50 MG tablet  Commonly known as:  APRESOLINE  Take 1.5 tablets (75 mg total) by mouth every 8 (eight) hours.     isosorbide mononitrate 60 MG 24 hr tablet  Commonly known as:  IMDUR  Take 1 tablet (60 mg total) by mouth daily.     losartan 25 MG tablet  Commonly known as:  COZAAR  Take 1 tablet (25 mg total) by mouth daily.     Magnesium-Chelated Zinc 133.33-5 MG Tabs  Take 1 tablet by mouth daily.     omega-3 acid ethyl esters 1 G capsule  Commonly known as:  LOVAZA  Take 1 g by mouth daily.     potassium chloride SA 20 MEQ tablet  Commonly known as:  K-DUR,KLOR-CON  Take 1 tablet (20 mEq total) by mouth 2 (two) times daily.     prenatal multivitamin Tabs  Take 1 tablet by mouth daily at 12 noon.     spironolactone 25 MG tablet  Commonly known as:  ALDACTONE  Take 0.5 tablets (12.5 mg total) by mouth daily.     vitamin C 500 MG tablet  Commonly known as:  ASCORBIC ACID  Take 500 mg by mouth daily.     Vitamin D 2000 UNITS tablet  Take 2,000 Units by mouth daily.        Disposition   The patient will be discharged in stable condition to home. Discharge Orders   Future Appointments Provider Department Dept Phone   12/22/2012 11:00 AM [facility] [phone]   Future Orders Complete By Expires     (HEART FAILURE PATIENTS) Call MD:  Anytime you have any of the following symptoms: 1) 3 pound weight gain in 24 hours or 5 pounds in 1 week 2) shortness of breath, with or without a dry hacking cough 3) swelling in the hands, feet or stomach 4) if you have to sleep on extra pillows at  night in order to breathe.  As directed     ACE Inhibitor / ARB already ordered  As directed     Diet - low sodium  heart healthy  As directed     Heart Failure patients record your daily weight using the same scale at the same time of day  As directed     Increase activity slowly  As directed       Follow-up Information   Follow up with Arvilla Meres, MD On 12/22/2012. (11:00 Garage Code 0100)    Contact information:   [address] [place] [address] [phone]         Duration of Discharge Encounter: Greater than 35 minutes   Signed, Miyana Mordecai NP-C 12/14/2012, 10:15 AM

## 2012-12-13 NOTE — Progress Notes (Signed)
CARDIAC REHAB PHASE I   PRE:  Rate/Rhythm: 79 SR  BP:  Supine:   Sitting: 124/74  Standing:    SaO2:   MODE:  Ambulation: 1050 ft   POST:  Rate/Rhythm: 98 SR  BP:  Supine:   Sitting: 132/81  Standing:    SaO2: 89-90 RA with rest 93% 1140-1230 Pt tolerated ambulation well, denies any pain or SOB. Room air sat after walk 89-90%. She denies that she feels SOB. Pt back to recliner after walk with call light on reach. Completed CHF education with pt. Gave her CHF education packet. Discussed CHF signs and symptoms, daily weights, low sodium diet, zones, exercise and when to call MD and 911. Pt voices understanding. Encouraged her to watch CHF video and showed her how to get it on TV.  Melina Copa RN 12/13/2012 12:25 PM

## 2012-12-13 NOTE — Progress Notes (Addendum)
Advanced Heart Failure Rounding Note   Subjective:    Yesterday hydralazine and labetalol restarted. Over night Nitro drip weaned off. SBP now < 150. Brisk diuresis with IV lasix. 24 hour  I/O -3.0 liters. Denies SOB/PND/Orthopnea.   12/11/12 ECHO 35% (previous 2/14 EF ~50%)  Creatinine 1.31>1.55. No weight recorded today.    Objective:   Weight Range:  Vital Signs:   Temp:  [97.8 F (36.6 C)-98.6 F (37 C)] 98.4 F (36.9 C) (05/20 0400) Pulse Rate:  [86-107] 87 (05/19 2115) Resp:  [14-31] 18 (05/20 0505) BP: (118-177)/(67-105) 137/88 mmHg (05/20 0505) SpO2:  [83 %-100 %] 98 % (05/20 0400) Last BM Date: 12/11/12  Weight change: Filed Weights   12/12/12 0616  Weight: 223 lb 12.3 oz (101.5 kg)    Intake/Output:   Intake/Output Summary (Last 24 hours) at 12/13/12 0657 Last data filed at 12/13/12 0510  Gross per 24 hour  Intake 1588.2 ml  Output   4500 ml  Net -2911.8 ml     Physical Exam: General:  Well appearing. No resp difficulty. Lying flat in bed HEENT: normal Neck: supple. JVP difficult to assess but does not appear elevated. Carotids 2+ bilat; no bruits. No lymphadenopathy or thryomegaly appreciated. Cor: PMI nondisplaced. Regular rate & rhythm. No rubs, gallops or murmurs. Lungs: clear Abdomen: obese, soft, nontender, nondistended. No hepatosplenomegaly. No bruits or masses. Good bowel sounds. Extremities: no cyanosis, clubbing, rash, edema Neuro: alert & orientedx3, cranial nerves grossly intact. moves all 4 extremities w/o difficulty. Affect pleasant  Telemetry: SR 70s  Labs: Basic Metabolic Panel:  Recent Labs Lab 12/11/12 2342 12/11/12 2357 12/12/12 1014 12/13/12 0415  NA 143  --  144 144  K 4.2  --  2.8* 3.9  CL 106  --  104 104  CO2 24  --  27 30  GLUCOSE 122*  --  152* 98  BUN 23  --  21 19  CREATININE 1.18*  --  1.31* 1.55*  CALCIUM 8.8  --  8.2* 7.9*  MG  --  1.9  --   --     Liver Function Tests:  Recent Labs Lab  12/11/12 2342  AST 83*  ALT 81*  ALKPHOS 61  BILITOT 1.1  PROT 6.4  ALBUMIN 3.1*   No results found for this basename: LIPASE, AMYLASE,  in the last 168 hours No results found for this basename: AMMONIA,  in the last 168 hours  CBC:  Recent Labs Lab 12/11/12 2342  WBC 7.4  NEUTROABS 3.8  HGB 11.0*  HCT 34.9*  MCV 86.0  PLT 274    Cardiac Enzymes: No results found for this basename: CKTOTAL, CKMB, CKMBINDEX, TROPONINI,  in the last 168 hours  BNP: BNP (last 3 results)  Recent Labs  12/11/12 2342  PROBNP 3902.0*     Other results:    Imaging: Dg Chest Port 1 View  12/12/2012   *RADIOLOGY REPORT*  Clinical Data: Short of breath.  PORTABLE CHEST - 1 VIEW  Comparison: 09/10/2009  Findings: AICD remains in place.  Cardiomegaly again noted.  New diffuse interstitial infiltrates seen, consistent with interstitial edema.  No evidence of pulmonary consolidation.  IMPRESSION: Acute congestive heart failure with diffuse interstitial edema.   Original Report Authenticated By: Myles Rosenthal, M.D.      Medications:     Scheduled Medications: . albuterol  2.5 mg Nebulization BID  . enoxaparin (LOVENOX) injection  40 mg Subcutaneous Daily  . furosemide  80 mg Intravenous BID  .  hydrALAZINE  50 mg Oral Q8H  . ipratropium  0.5 mg Nebulization BID  . labetalol  300 mg Oral TID  . nitroGLYCERIN  2-200 mcg/min Intravenous Once  . potassium chloride  20 mEq Oral BID     Infusions: . sodium chloride Stopped (12/13/12 0510)  . nitroGLYCERIN Stopped (12/13/12 0510)     PRN Medications:  acetaminophen, nitroGLYCERIN   Assessment:  1. Hypertensive Crisis-  2. A/c diastolic HF  3. H/o Post partum Cardiomyopathy  -most recent EF 50% 08/2012  -S/P ICD  4. Morbid obesity  5. Noncompliance  6. Acute respiratory failure - resolved 7. A/c renal failure   Plan/Discussion:    Stable off nitro drip. SBP < 150. Increase hydralazine 75 mg tid. Add IMDUR 30 mg daily.  Continue labetalol at current dose. Volume status improved. Creatinine bump noted. Stop IV lasix. Hold diuretics today. Anticipate restarting po lasix tomorrow.   Consult cardiac rehab.   Anticipate transferring to telemetry with possible d/c in the next day or two. Length of Stay: 2 CLEGG,AMY  NP-C  12/13/2012, 6:57 AM  Patient seen and examined with Tonye Becket, NP. We discussed all aspects of the encounter. I agree with the assessment and plan as stated above.   Much improved with diuresis. BP better. Renal function slightly worse. Will hold diuretics today. Switch labetalol to labetalol. Given that she is no longer pregnant or breastfeeding will add losartan 25 daily and spiro 12.5 daily. Start lasix 80 daily in am.   Transfer to 4700 and probably home tomorrow.   Medicines for d/c:  Carvedilol 12.5 bid Losartan 25 daily Hydralazine 50 tid Lasix 80 daily Spiro 12.5 daily Imdur 30 daily  Mccabe Gloria,MD 10:25 AM Advanced Heart Failure Team Pager [phone] (M-F; 7a - 4p)  Please contact [facility] for night-coverage after hours (4p -7a ) and weekends on [email or web address]

## 2012-12-14 LAB — BASIC METABOLIC PANEL
CO2: 27 mEq/L (ref 19–32)
Calcium: 8.6 mg/dL (ref 8.4–10.5)
Chloride: 105 mEq/L (ref 96–112)
Creatinine, Ser: 1.18 mg/dL — ABNORMAL HIGH (ref 0.50–1.10)
Glucose, Bld: 104 mg/dL — ABNORMAL HIGH (ref 70–99)

## 2012-12-14 MED ORDER — FUROSEMIDE 80 MG PO TABS: 80.0000 mg | ORAL_TABLET | Freq: Every day | ORAL | Status: DC

## 2012-12-14 MED ORDER — ISOSORBIDE MONONITRATE ER 60 MG PO TB24: 60.0000 mg | ORAL_TABLET | Freq: Every day | ORAL | Status: DC

## 2012-12-14 MED ORDER — LOSARTAN POTASSIUM 25 MG PO TABS: 25.0000 mg | ORAL_TABLET | Freq: Every day | ORAL | Status: DC

## 2012-12-14 MED ORDER — HYDRALAZINE HCL 50 MG PO TABS: 75.0000 mg | ORAL_TABLET | Freq: Three times a day (TID) | ORAL | Status: DC

## 2012-12-14 MED ORDER — CARVEDILOL 12.5 MG PO TABS: 12.5000 mg | ORAL_TABLET | Freq: Two times a day (BID) | ORAL | Status: DC

## 2012-12-14 MED ORDER — SPIRONOLACTONE 25 MG PO TABS: 12.5000 mg | ORAL_TABLET | Freq: Every day | ORAL | Status: DC

## 2012-12-14 MED ORDER — ALBUTEROL SULFATE (5 MG/ML) 0.5% IN NEBU
2.5000 mg | INHALATION_SOLUTION | RESPIRATORY_TRACT | Status: DC | PRN
  Administered 2012-12-14: 2.5 mg via RESPIRATORY_TRACT
  Filled 2012-12-14: qty 0.5

## 2012-12-14 MED ORDER — IPRATROPIUM BROMIDE 0.02 % IN SOLN
0.5000 mg | RESPIRATORY_TRACT | Status: DC | PRN
  Administered 2012-12-14: 0.5 mg via RESPIRATORY_TRACT
  Filled 2012-12-14: qty 2.5

## 2012-12-14 NOTE — Progress Notes (Signed)
Advanced Heart Failure Rounding Note   Subjective:    Yesterday she was switched from labetalol to carvedilol and IMDUR/ losartan  added. Diuretics held due to creatinine bump.  SBP < 160. Denies SOB/PND/Orthopnea.   12/11/12 ECHO 35% (previous 2/14 EF ~50%)  Creatinine 1.31>1.55>1.18  Weight up 2 pounds.     Objective:   Weight Range:  Vital Signs:   Temp:  [97.6 F (36.4 C)-98.2 F (36.8 C)] 97.6 F (36.4 C) (05/21 0507) Pulse Rate:  [84-93] 84 (05/21 0529) Resp:  [18] 18 (05/21 0507) BP: (132-173)/(81-111) 159/102 mmHg (05/21 0529) SpO2:  [93 %-99 %] 97 % (05/21 0507) Weight:  [220 lb 1.6 oz (99.837 kg)-222 lb 14.4 oz (101.107 kg)] 222 lb 14.4 oz (101.107 kg) (05/21 0508) Last BM Date: 12/13/12  Weight change: Filed Weights   12/13/12 1500 12/13/12 1604 12/14/12 0508  Weight: 221 lb 12.5 oz (100.6 kg) 220 lb 1.6 oz (99.837 kg) 222 lb 14.4 oz (101.107 kg)    Intake/Output:   Intake/Output Summary (Last 24 hours) at 12/14/12 0743 Last data filed at 12/14/12 0400  Gross per 24 hour  Intake    480 ml  Output   1050 ml  Net   -570 ml     Physical Exam: General:  Well appearing. No resp difficulty. Lying flat in bed HEENT: normal Neck: supple. JVP 8-9 cm. Carotids 2+ bilat; no bruits. No lymphadenopathy or thryomegaly appreciated. Cor: PMI nondisplaced. Regular rate & rhythm. No rubs, gallops or murmurs. Lungs: clear Abdomen: obese, soft, nontender, nondistended. No hepatosplenomegaly. No bruits or masses. Good bowel sounds. Extremities: no cyanosis, clubbing, rash, edema Neuro: alert & orientedx3, cranial nerves grossly intact. moves all 4 extremities w/o difficulty. Affect pleasant  Telemetry: SR 70s  Labs: Basic Metabolic Panel:  Recent Labs Lab 12/11/12 2342 12/11/12 2357 12/12/12 1014 12/13/12 0415 12/14/12 0500  NA 143  --  144 144 142  K 4.2  --  2.8* 3.9 3.8  CL 106  --  104 104 105  CO2 24  --  27 30 27   GLUCOSE 122*  --  152* 98 104*  BUN  23  --  21 19 19   CREATININE 1.18*  --  1.31* 1.55* 1.18*  CALCIUM 8.8  --  8.2* 7.9* 8.6  MG  --  1.9  --   --   --     Liver Function Tests:  Recent Labs Lab 12/11/12 2342  AST 83*  ALT 81*  ALKPHOS 61  BILITOT 1.1  PROT 6.4  ALBUMIN 3.1*   No results found for this basename: LIPASE, AMYLASE,  in the last 168 hours No results found for this basename: AMMONIA,  in the last 168 hours  CBC:  Recent Labs Lab 12/11/12 2342  WBC 7.4  NEUTROABS 3.8  HGB 11.0*  HCT 34.9*  MCV 86.0  PLT 274    Cardiac Enzymes: No results found for this basename: CKTOTAL, CKMB, CKMBINDEX, TROPONINI,  in the last 168 hours  BNP: BNP (last 3 results)  Recent Labs  12/11/12 2342  PROBNP 3902.0*     Other results:    Imaging: No results found.   Medications:     Scheduled Medications: . carvedilol  12.5 mg Oral BID WC  . enoxaparin (LOVENOX) injection  40 mg Subcutaneous Daily  . hydrALAZINE  50 mg Oral Q8H  . isosorbide mononitrate  30 mg Oral Daily  . losartan  25 mg Oral Daily  . potassium chloride  20 mEq  Oral BID  . spironolactone  12.5 mg Oral Daily    Infusions: . sodium chloride Stopped (12/13/12 0510)    PRN Medications: acetaminophen, albuterol, ipratropium, nitroGLYCERIN   Assessment:  1. Hypertensive Crisis-  2. A/c diastolic HF  3. H/o Post partum Cardiomyopathy  -most recent EF 50% 08/2012  -S/P ICD  4. Morbid obesity  5. Noncompliance  6. Acute respiratory failure - resolved 7. A/c renal failure   Plan/Discussion:   Volume status stable.  SBP> 150. Increase hydralazine to 75 mg tid. Increase IMDUR 60 mg daily Restart lasix 80 mg daily  Medicines for d/c:  Carvedilol 12.5 bid Losartan 25 daily Hydralazine 75 tid Lasix 80 daily Spiro 12.5 daily Imdur 60 daily Length of Stay: 3 [name],AMY  NP-C  12/14/2012, 7:43 AM  Patient seen with NP, agree with the above note.  She is feeling reasonably well today, creatinine has come back down.   She is mildly volume overloaded.  BP is high.  I agree that she can go home today but with close followup and some med adjustment.  - Will change to Coreg today and increase hydralazine/Imdur. - Will restart Lasix 80 mg daily today.  This will be her home dose.  - Followup early next week.   Marca Ancona 12/14/2012 8:33 AM

## 2012-12-14 NOTE — Progress Notes (Signed)
Patient's IV and telemetry has been discontinued, patient verbalizes understanding of discharge instructions and will be discharged home with husband and children.  Lorretta Harp RN

## 2012-12-14 NOTE — Progress Notes (Signed)
For D/c. Pt sts no questions, feels comfortable with plan, is walking independently. Ethelda Chick CES, ACSM 10:33 AM 12/14/2012

## 2012-12-14 NOTE — Progress Notes (Signed)
Heart Failure Education has been given and patient has no further questions.  Lorretta Harp RN

## 2012-12-14 NOTE — Progress Notes (Signed)
Pt. With elevated BP this am of 159/102. Scheduled BP medication administered as ordered. On call MD notified. RN will continue to monitor pt. For changes in condition. [name], [name]

## 2012-12-18 LAB — CULTURE, BLOOD (ROUTINE X 2): Culture: NO GROWTH

## 2012-12-22 ENCOUNTER — Ambulatory Visit (HOSPITAL_COMMUNITY)
Admission: RE | Admit: 2012-12-22 | Discharge: 2012-12-22 | Disposition: A | Discharge status: Home / Self Care | Payer: 59 | Source: Ambulatory Visit | Attending: Internal Medicine | Admitting: Internal Medicine

## 2012-12-22 ENCOUNTER — Encounter (HOSPITAL_COMMUNITY): Payer: Self-pay | Admitting: *Deleted

## 2012-12-22 ENCOUNTER — Encounter (HOSPITAL_COMMUNITY): Payer: Self-pay

## 2012-12-22 VITALS — BP 130/82 | HR 93 | Wt 219.0 lb

## 2012-12-22 DIAGNOSIS — I5022 Chronic systolic (congestive) heart failure: Secondary | ICD-10-CM

## 2012-12-22 DIAGNOSIS — I10 Essential (primary) hypertension: Secondary | ICD-10-CM

## 2012-12-22 LAB — BASIC METABOLIC PANEL
GFR calc Af Amer: 58 mL/min — ABNORMAL LOW (ref >90)
GFR calc non Af Amer: 50 mL/min — ABNORMAL LOW (ref >90)
Potassium: 4 mEq/L (ref 3.5–5.1)
Sodium: 142 mEq/L (ref 135–145)

## 2012-12-22 NOTE — Assessment & Plan Note (Signed)
Blood pressure well controlled. Continue current regimen.  

## 2012-12-22 NOTE — Addendum Note (Signed)
Encounter addended by: Noralee Space, RN on: 12/22/2012 11:40 AM<BR>     Documentation filed: Patient Instructions Section, Orders

## 2012-12-22 NOTE — Assessment & Plan Note (Signed)
Doing very well. Weight stable. Will continue current regimen. At next visit could consider increasing losartan but given dizziness I will hold off for now. Reinforced need for daily weights and reviewed use of sliding scale diuretics. Take extra 40 mg lasix in the afternoon if weight 216 or greater. Will check BMET today. Hopefully EF will recover completely. Will repeat echo in 2-3 months.

## 2012-12-22 NOTE — Progress Notes (Signed)
PCP: Dr. Kellie Shropshire  OB:  Dr. Pennie Rushing    HPI: [name] is a 37 year old woman with h/o obesity, chronic systolic heart failure due to nonischemic cardiomyopathy (previously 25% in 2007 but now with recovered EF) s/p ICD implantation and severhypertension.    Cardiac Cath 2007 -Findings consistent with nonischemic, mildly dilated cardiomyopathy. This is probably related to hypertension with hypertensive heart disease. Severe left ventricular systolic dysfunction, ejection fraction around 35% with moderate to marked global hypokinesis. Moderate pulmonary hypertension. Preserved cardiac output and cardiac index.  ECHOs 04/27/2011 45-50%.   08/20/11 ECHO 55-60%   05/19/12 EF ~40%   07/01/12 EF ~40%   08/02/12 EF~40%    2/14 EF ~50%  She delievered her baby on September 25, 2012 without complication. Admitted 5/18 -5/21 with hypertensive crisis and decompensated systolic heart failure. Treated with BIPAP and IV lasix. Discharge weight 222lbs. Echo showed EF down from 50->35%. Cr bumped to 1.55 but was 1.18 at d/c.  Returns for f/u. Doing great. Dyspnea resolves. SBP in 130s. Compliant with meds. Minimal edema. Weighing daily. Weight stable at 213 pounds. Dizzy at times.   ROS: All systems negative except as listed in HPI, PMH and Problem List.  Past Medical History  Diagnosis Date  . Hypertension     currently on Labetalol 200mg  BID  . Infection     UTI;not frequent;last infection was in June  . Infection     Yeast;not frequent  . Asthma     Inhaler prn;triggered by seasonal changes mostly cold weathert;last asthma  attack years ago  . CHF (congestive heart failure) 01/2006    Has Pacemaker and Defibrillator;was on heart meds;no longer taking since pregancy  . Anemia     Iron supplements in the past  . Migraines     Can't take Procardia XL, causes migraines  . Varicose veins 1998    Developed during last pregnancy  . Other primary cardiomyopathies   . Atrial fibrillation   . Paroxysmal  ventricular tachycardia   . CHF (congestive heart failure)   . Hypertension   . Acute bronchitis and bronchiolitis     Current Outpatient Prescriptions  Medication Sig Dispense Refill  . albuterol (PROVENTIL HFA;VENTOLIN HFA) 108 (90 BASE) MCG/ACT inhaler Inhale 2 puffs into the lungs every 6 (six) hours as needed. Rescue inhaler      . aspirin 81 MG tablet Take 81 mg by mouth daily.      . carvedilol (COREG) 12.5 MG tablet Take 1 tablet (12.5 mg total) by mouth 2 (two) times daily with a meal.  60 tablet  3  . cetirizine (ZYRTEC) 10 MG tablet Take 10 mg by mouth daily.      . Cholecalciferol (VITAMIN D) 2000 UNITS tablet Take 2,000 Units by mouth daily.       . furosemide (LASIX) 80 MG tablet Take 1 tablet (80 mg total) by mouth daily.  30 tablet  3  . hydrALAZINE (APRESOLINE) 50 MG tablet Take 1.5 tablets (75 mg total) by mouth every 8 (eight) hours.  135 tablet  3  . isosorbide mononitrate (IMDUR) 60 MG 24 hr tablet Take 1 tablet (60 mg total) by mouth daily.  30 tablet  3  . losartan (COZAAR) 25 MG tablet Take 1 tablet (25 mg total) by mouth daily.  30 tablet  3  . Magnesium-Zinc (MAGNESIUM-CHELATED ZINC) 133.33-5 MG TABS Take 1 tablet by mouth daily.      Marland Kitchen omega-3 acid ethyl esters (LOVAZA) 1 G capsule Take 1  g by mouth daily.      . potassium chloride SA (K-DUR,KLOR-CON) 20 MEQ tablet Take 1 tablet (20 mEq total) by mouth 2 (two) times daily.  60 tablet  6  . Prenatal Vit-Fe Fumarate-FA (PRENATAL MULTIVITAMIN) TABS Take 1 tablet by mouth daily at 12 noon.      Marland Kitchen spironolactone (ALDACTONE) 25 MG tablet Take 0.5 tablets (12.5 mg total) by mouth daily.  15 each  3  . vitamin C (ASCORBIC ACID) 500 MG tablet Take 500 mg by mouth daily.       No current facility-administered medications for this encounter.     PHYSICAL EXAM: Filed Vitals:   12/22/12 1119  BP: 130/82  Pulse: 93  Weight: 219 lb (99.338 kg)  SpO2: 98%  Repeat 148/98  General:  Well appearing. No resp  difficulty HEENT: normal Neck: supple. JVP 7 Carotids 2+ bilaterally; no bruits. No lymphadenopathy or thryomegaly appreciated. Cor: PMI normal. Regular rate & rhythm. +s4. No murmurs Lungs: clear Abdomen: soft, nontender, non-distended and obese. No hepatosplenomegaly. No bruits or masses. Good bowel sounds. Extremities: no cyanosis, clubbing, rash, no edema Neuro: alert & orientedx3, cranial nerves grossly intact. Moves all 4 extremities w/o difficulty. Affect pleasant.    ASSESSMENT & PLAN:

## 2012-12-22 NOTE — Patient Instructions (Addendum)
Lab today  Return to work on Monday  Your physician recommends that you schedule a follow-up appointment in: 1 month

## 2012-12-30 ENCOUNTER — Telehealth: Payer: Self-pay | Admitting: Internal Medicine

## 2012-12-30 ENCOUNTER — Encounter: Payer: Self-pay | Admitting: Internal Medicine

## 2012-12-30 NOTE — Telephone Encounter (Addendum)
12-30-12 sent past due letter/mt 01-26-13 lmm @ 1208pm for pt to call to have defib check  taylor/ brooke if possible/mt

## 2013-01-18 ENCOUNTER — Ambulatory Visit (HOSPITAL_COMMUNITY): Payer: 59 | Attending: Internal Medicine

## 2013-01-19 ENCOUNTER — Encounter (HOSPITAL_COMMUNITY): Payer: Self-pay | Admitting: *Deleted

## 2013-01-23 ENCOUNTER — Other Ambulatory Visit (HOSPITAL_COMMUNITY): Payer: Self-pay | Admitting: Physician Assistant

## 2013-02-09 ENCOUNTER — Ambulatory Visit (INDEPENDENT_AMBULATORY_CARE_PROVIDER_SITE_OTHER): Payer: BC Managed Care – PPO | Admitting: Cardiology

## 2013-02-09 ENCOUNTER — Encounter: Payer: Self-pay | Admitting: Cardiology

## 2013-02-09 VITALS — BP 139/77 | HR 76 | Ht 61.5 in | Wt 223.0 lb

## 2013-02-09 DIAGNOSIS — I5022 Chronic systolic (congestive) heart failure: Secondary | ICD-10-CM

## 2013-02-09 DIAGNOSIS — Z95810 Presence of automatic (implantable) cardiac defibrillator: Secondary | ICD-10-CM

## 2013-02-09 DIAGNOSIS — I428 Other cardiomyopathies: Secondary | ICD-10-CM

## 2013-02-09 LAB — ICD DEVICE OBSERVATION
FVT: 0
PACEART VT: 0
RV LEAD AMPLITUDE: 17.8 mv
RV LEAD THRESHOLD: 1.5 V
TZAT-0001FASTVT: 1
TZAT-0001SLOWVT: 1
TZAT-0001SLOWVT: 2
TZAT-0002FASTVT: NEGATIVE
TZAT-0002FASTVT: NEGATIVE
TZAT-0005SLOWVT: 84 pct
TZAT-0005SLOWVT: 91 pct
TZAT-0011FASTVT: 10 ms
TZAT-0011FASTVT: 10 ms
TZAT-0011SLOWVT: 10 ms
TZAT-0011SLOWVT: 10 ms
TZAT-0013FASTVT: 2
TZAT-0013FASTVT: 2
TZAT-0018FASTVT: NEGATIVE
TZAT-0018FASTVT: NEGATIVE
TZAT-0019FASTVT: 8 V
TZAT-0019FASTVT: 8 V
TZAT-0019SLOWVT: 8 V
TZON-0003FASTVT: 330 ms
TZON-0008FASTVT: 0 ms
TZON-0011AFLUTTER: 70
TZST-0001FASTVT: 4
TZST-0001SLOWVT: 3
TZST-0001SLOWVT: 4
TZST-0002FASTVT: NEGATIVE
TZST-0002FASTVT: NEGATIVE
TZST-0002FASTVT: NEGATIVE
TZST-0003FASTVT: 35 J
TZST-0003FASTVT: 35 J
TZST-0003SLOWVT: 25 J
TZST-0003SLOWVT: 35 J

## 2013-02-09 NOTE — Patient Instructions (Addendum)
Your physician wants you to follow-up in: 1 YEAR WITH DR. Court Kaizley will receive a reminder letter in the mail two months in advance. If you don't receive a letter, please call our office to schedule the follow-up appointment.  YOU WILL HAVE A REMOTE CHECK IN 3 MONTHS  Your physician recommends that you continue on your current medications as directed. Please refer to the Current Medication list given to you today.

## 2013-02-09 NOTE — Progress Notes (Signed)
ELECTROPHYSIOLOGY OFFICE NOTE  Patient ID: [name] MRN: [id number], DOB/AGE: 02-20-1976   Date of Visit: 02/09/2013  Primary Physician: Alva Garnet., MD Primary Cardiologist / EP: Gala Romney, MD / Ladona Ridgel, MD Reason for Visit: EP/device follow-up  History of Present Illness  [name] is a 37 y.o. female with h/o chronic systolic heart failure due to a nonischemic cardiomyopathy, s/p ICD implantation and hypertension. She presents today for routine electrophysiology followup.   Since last being seen in our clinic, she has had a baby girl who is now 36 months old. She delivered March 2014 without complication. She was then admitted to [facility] in May with hypertensive crisis and decompensated HF (she previously had recovered EF 50% by echo Feb 2014). Echo in May showed EF down from 50% to 35%.   Today she reports she is doing well and has no complaints. She denies chest pain or shortness of breath. She denies palpitations, dizziness, near syncope or syncope. She denies LE swelling, orthopnea, PND or recent weight gain. She is compliant and tolerating medications without difficulty.  Past Medical History Past Medical History  Diagnosis Date  . Hypertension     currently on Labetalol 200mg  BID  . Infection     UTI;not frequent;last infection was in June  . Infection     Yeast;not frequent  . Asthma     Inhaler prn;triggered by seasonal changes mostly cold weathert;last asthma  attack years ago  . CHF (congestive heart failure) 01/2006    Has Pacemaker and Defibrillator;was on heart meds;no longer taking since pregancy  . Anemia     Iron supplements in the past  . Migraines     Can't take Procardia XL, causes migraines  . Varicose veins 1998    Developed during last pregnancy  . Other primary cardiomyopathies   . Atrial fibrillation   . Paroxysmal ventricular tachycardia   . CHF (congestive heart failure)   . Hypertension   . Acute bronchitis and bronchiolitis     Past  Surgical History Past Surgical History  Procedure Laterality Date  . Cesarean section  1998    d/t preeclampsia  . Breast reduction surgery  03/05/1994    d/t back, shoulder, and neck pain (42 F)  . Insert / replace / remove pacemaker   02/10/2006    Status post implantable cardioverter-defibrillator insertion  .Marland KitchenMarland KitchenMarland KitchenThe Medtronic Maximo VR, model 7232, single chamber  . Cardiac catheterization      EF 30-35%  . Cardiac defibrillator placement      Allergies/Intolerances Allergies  Allergen Reactions  . Oxycodone-Acetaminophen Shortness Of Breath  . Percocet (Oxycodone-Acetaminophen) Shortness Of Breath   Current Home Medications Current Outpatient Prescriptions  Medication Sig Dispense Refill  . albuterol (PROVENTIL HFA;VENTOLIN HFA) 108 (90 BASE) MCG/ACT inhaler Inhale 2 puffs into the lungs every 6 (six) hours as needed. Rescue inhaler      . carvedilol (COREG) 12.5 MG tablet Take 1 tablet (12.5 mg total) by mouth 2 (two) times daily with a meal.  60 tablet  3  . cetirizine (ZYRTEC) 10 MG tablet Take 10 mg by mouth daily.      . furosemide (LASIX) 80 MG tablet Take 1 tablet (80 mg total) by mouth daily.  30 tablet  3  . hydrALAZINE (APRESOLINE) 50 MG tablet Take 1.5 tablets (75 mg total) by mouth every 8 (eight) hours.  135 tablet  3  . isosorbide mononitrate (IMDUR) 60 MG 24 hr tablet Take 1 tablet (60 mg total) by  mouth daily.  30 tablet  3  . losartan (COZAAR) 25 MG tablet Take 1 tablet (25 mg total) by mouth daily.  30 tablet  3  . potassium chloride SA (K-DUR,KLOR-CON) 20 MEQ tablet TAKE 1 TABLET BY MOUTH TWICE DAILY  60 tablet  2  . Prenatal Vit-Fe Fumarate-FA (PRENATAL MULTIVITAMIN) TABS Take 1 tablet by mouth daily at 12 noon.      Marland Kitchen spironolactone (ALDACTONE) 25 MG tablet Take 0.5 tablets (12.5 mg total) by mouth daily.  15 each  3  . vitamin C (ASCORBIC ACID) 500 MG tablet Take 500 mg by mouth daily.       No current facility-administered medications for this visit.    Social History History   Social History  . Marital Status: Married    Spouse Name: [name]    Number of Children: 1  . Years of Education: 15   Occupational History  . Asst. Manager     [employer]   Social History Main Topics  . Smoking status: Never Smoker   . Smokeless tobacco: Never Used  . Alcohol Use: No  . Drug Use: No    Birth Control/ Protection: Condom, Pill, None   Review of Systems General: No chills, fever, night sweats or weight changes Cardiovascular: No chest pain, dyspnea on exertion, edema, orthopnea, palpitations, paroxysmal nocturnal dyspnea Dermatological: No rash, lesions or masses Respiratory: No cough, dyspnea Urologic: No hematuria, dysuria Abdominal: No nausea, vomiting, diarrhea, bright red blood per rectum, melena, or hematemesis Neurologic: No visual changes, weakness, changes in mental status All other systems reviewed and are otherwise negative except as noted above.  Physical Exam Vitals: Blood pressure 139/77, pulse 76, height 5' 1.5" (1.562 m), weight 223 lb (101.152 kg).  General: Well developed, well appearing 38 y.o. female in no acute distress. HEENT: Normocephalic, atraumatic. EOMs intact. Sclera nonicteric. Oropharynx clear.  Neck: Supple. No JVD. Lungs: Respirations regular and unlabored, CTA bilaterally. No wheezes, rales or rhonchi. Heart: RRR. S1, S2 present. No murmurs, rub, S3 or S4. Abdomen: Soft, non-distended.  Extremities: No clubbing, cyanosis or edema. PT/Radials 2+ and equal bilaterally. Psych: Normal affect. Neuro: Alert and oriented X 3. Moves all extremities spontaneously.   Diagnostics Device interrogation today - Normal device function. Thresholds and sensing consistent with previous device measurements. Impedance trends stable over time. 2 NST episodes, longest 7 beats, no EGMs. Histogram distribution appropriate for patient and level of activity. No changes made this session. Device programmed at appropriate  safety margins. Device programmed to optimize intrinsic conduction. Pt enrolled in remote follow-up  Assessment and Plan 1. NICM s/p ICD implant Normal device function No ventricular arrhythmias No programming changes made Continue routine remote ICD follow-up every 3 months Return for follow-up with Dr. Ladona Ridgel in one year 2. Chronic systolic HF Stable; euvolemic by exam today Continue medical therapy Followed closely by Dr. Gala Romney  Signed, Yadier Bramhall, PA-C 02/09/2013, 4:49 PM

## 2013-04-21 ENCOUNTER — Other Ambulatory Visit (HOSPITAL_COMMUNITY): Payer: Self-pay | Admitting: Adult Health

## 2013-05-03 ENCOUNTER — Encounter (INDEPENDENT_AMBULATORY_CARE_PROVIDER_SITE_OTHER): Payer: Self-pay

## 2013-05-03 ENCOUNTER — Ambulatory Visit (HOSPITAL_COMMUNITY)
Admission: RE | Admit: 2013-05-03 | Discharge: 2013-05-03 | Disposition: A | Discharge status: Home / Self Care | Payer: BC Managed Care – PPO | Source: Ambulatory Visit | Attending: Cardiology | Admitting: Cardiology

## 2013-05-03 VITALS — BP 166/108 | HR 91 | Wt 230.5 lb

## 2013-05-03 DIAGNOSIS — R42 Dizziness and giddiness: Secondary | ICD-10-CM | POA: Insufficient documentation

## 2013-05-03 DIAGNOSIS — I509 Heart failure, unspecified: Secondary | ICD-10-CM | POA: Insufficient documentation

## 2013-05-03 DIAGNOSIS — I4891 Unspecified atrial fibrillation: Secondary | ICD-10-CM | POA: Insufficient documentation

## 2013-05-03 DIAGNOSIS — R51 Headache: Secondary | ICD-10-CM | POA: Insufficient documentation

## 2013-05-03 DIAGNOSIS — Z95810 Presence of automatic (implantable) cardiac defibrillator: Secondary | ICD-10-CM | POA: Insufficient documentation

## 2013-05-03 DIAGNOSIS — J45909 Unspecified asthma, uncomplicated: Secondary | ICD-10-CM | POA: Insufficient documentation

## 2013-05-03 DIAGNOSIS — I5022 Chronic systolic (congestive) heart failure: Secondary | ICD-10-CM

## 2013-05-03 DIAGNOSIS — I428 Other cardiomyopathies: Secondary | ICD-10-CM | POA: Insufficient documentation

## 2013-05-03 DIAGNOSIS — I10 Essential (primary) hypertension: Secondary | ICD-10-CM

## 2013-05-03 DIAGNOSIS — G43909 Migraine, unspecified, not intractable, without status migrainosus: Secondary | ICD-10-CM | POA: Insufficient documentation

## 2013-05-03 LAB — BASIC METABOLIC PANEL
BUN: 21 mg/dL (ref 6–23)
CO2: 25 mEq/L (ref 19–32)
Calcium: 8.7 mg/dL (ref 8.4–10.5)
Creatinine, Ser: 1.04 mg/dL (ref 0.50–1.10)
GFR calc non Af Amer: 67 mL/min — ABNORMAL LOW (ref >90)
Glucose, Bld: 104 mg/dL — ABNORMAL HIGH (ref 70–99)
Sodium: 142 mEq/L (ref 135–145)

## 2013-05-03 MED ORDER — CARVEDILOL 25 MG PO TABS: 25.0000 mg | ORAL_TABLET | Freq: Two times a day (BID) | ORAL | Status: DC

## 2013-05-03 NOTE — Patient Instructions (Signed)
Increase coreg to 25 mg BID. Call if notice any increase in dizziness or fatigue.  Take extra lasix today.  Record blood pressures and bring to next visit.  Follow up 2-3 weeks.  Do the following things EVERYDAY: 1) Weigh yourself in the morning before breakfast. Write it down and keep it in a log. 2) Take your medicines as prescribed 3) Eat low salt foods-Limit salt (sodium) to 2000 mg per day.  4) Stay as active as you can everyday 5) Limit all fluids for the day to less than 2 liters

## 2013-05-03 NOTE — Progress Notes (Signed)
Patient ID: [name], female   DOB: [birth year], 37 y.o.   MRN: [id number] PCP: Dr. Kellie [name]  OB:  Dr. Pennie [name]  EP: Dr. Ladona [name]  HPI: [name] is a 37 year old woman with h/o obesity, chronic systolic heart failure due to nonischemic cardiomyopathy (previously recovered, but now 35%, 11/2012) s/p ICD implantation and severe hypertension.    Cardiac Cath 2007 -Findings consistent with nonischemic, mildly dilated cardiomyopathy. This is probably related to hypertension with hypertensive heart disease. Severe left ventricular systolic dysfunction, ejection fraction around 35% with moderate to marked global hypokinesis. Moderate pulmonary hypertension. Preserved cardiac output and cardiac index.  ECHOs 04/27/2011 45-50%.   08/20/11 ECHO 55-60%   05/19/12 EF ~40%   07/01/12 EF ~40%   08/02/12 EF~40%    2/14 EF ~50%                          5/14 EF ~35%                          She delievered her baby on September 25, 2012 without complication. Admitted 5/18 -5/21 with hypertensive crisis and decompensated systolic heart failure. Treated with BIPAP and IV lasix. Discharge weight 222lbs. Echo showed EF down from 50->35%. Cr bumped to 1.55 but was 1.18 at d/c.  Follow up: Doing ok. Denies CP, orthopnea, or edema. +DOE has to stop from walking from parking lot to the store. +Dizziness and headaches (about 3-4 a week). Weight at home 223-225 lbs. Following low salt diet and drinking less than 2 L a day. Taking medications as prescribed. + abdominal distention. Saw Dr. Renae [name] last week and was told lungs of 37 yr old and was started on zpak, prednisone and Advair. 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    ROS: All systems negative except as listed in HPI, PMH and Problem List.  Past Medical History  Diagnosis Date  . Hypertension     currently on Labetalol 200mg  BID  . Infection     UTI;not frequent;last infection was in June   . Infection     Yeast;not frequent  . Asthma     Inhaler prn;triggered by seasonal changes mostly cold weathert;last asthma  attack years ago  . CHF (congestive heart failure) 01/2006    Has Pacemaker and Defibrillator;was on heart meds;no longer taking since pregancy  . Anemia     Iron supplements in the past  . Migraines     Can't take Procardia XL, causes migraines  . Varicose veins 1998    Developed during last pregnancy  . Other primary cardiomyopathies   . Atrial fibrillation   . Paroxysmal ventricular tachycardia   . CHF (congestive heart failure)   . Hypertension   . Acute bronchitis and bronchiolitis     Current Outpatient Prescriptions  Medication Sig Dispense Refill  . albuterol (PROVENTIL HFA;VENTOLIN HFA) 108 (90 BASE) MCG/ACT inhaler Inhale 2 puffs into the lungs every 6 (six) hours as needed. Rescue inhaler      . carvedilol (COREG) 12.5 MG tablet Take 1 tablet (12.5 mg total) by mouth 2 (two) times daily with a meal.  60 tablet  3  . cetirizine (ZYRTEC) 10 MG tablet Take 10 mg by mouth daily.      . Fluticasone-Salmeterol (ADVAIR) 250-50 MCG/DOSE AEPB Inhale 1 puff into the  lungs every 12 (twelve) hours.      . furosemide (LASIX) 80 MG tablet Take 1 tablet (80 mg total) by mouth daily.  30 tablet  3  . hydrALAZINE (APRESOLINE) 50 MG tablet Take 1.5 tablets (75 mg total) by mouth every 8 (eight) hours.  135 tablet  3  . isosorbide mononitrate (IMDUR) 60 MG 24 hr tablet TAKE 1 TABLET BY MOUTH ONCE DAILY  34 tablet  2  . losartan (COZAAR) 25 MG tablet TAKE 1 TABLET BY MOUTH ONCE DAILY  34 tablet  2  . potassium chloride SA (K-DUR,KLOR-CON) 20 MEQ tablet TAKE 1 TABLET BY MOUTH TWICE DAILY  60 tablet  2  . Prenatal Vit-Fe Fumarate-FA (PRENATAL MULTIVITAMIN) TABS Take 1 tablet by mouth daily at 12 noon.      . ranitidine (ZANTAC) 150 MG tablet Take 150 mg by mouth daily.      Marland Kitchen spironolactone (ALDACTONE) 25 MG tablet TAKE 1/2 TABLET BY MOUTH EVERY DAY  17 tablet  2   No  current facility-administered medications for this encounter.    Filed Vitals:   05/03/13 0850  BP: 166/108  Pulse: 91  Weight: 230 lb 8 oz (104.554 kg)  SpO2: 95%  Repeat BP 180/110  PHYSICAL EXAM: General:  Well appearing. No resp difficulty HEENT: normal Neck: supple. JVP 7; Carotids 2+ bilaterally; no bruits. No lymphadenopathy or thryomegaly appreciated. Cor: PMI normal. Regular rate & rhythm. +s4. No murmurs Lungs: clear Abdomen: soft, nontender, mildly distended and obese. No hepatosplenomegaly. No bruits or masses. Good bowel sounds. Extremities: no cyanosis, clubbing, rash, no edema Neuro: alert & orientedx3, cranial nerves grossly intact. Moves all 4 extremities w/o difficulty. Affect pleasant.  ASSESSMENT & PLAN:  1) Chronic systolic heart failure, NICM likely r/t HTN, EF 35% (11/2012), s/p ICD - NYHA II-III symptoms. Volume status is slightly elevated. Will have her take an extra 20 mg lasix today and then go back to 80 mg daily - SBP extremely elevated. 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 Will try again to increase coreg to 25 mg BID and if she dose not tolerate will go up on losartan to 50 mg daily. - BMET and pro-BNP today. - Reinforced the need and importance of daily weights, a low sodium diet, and fluid restriction (less than 2 L a day). Instructed to call the HF clinic if weight increases more than 3 lbs overnight or 5 lbs in a week. 2) HTN - elevated. As above will increase coreg to 25 mg BID. - continue current doses of spiro, hydralazine and IMDUR - asked her to record BP daily and bring log with her to next visit.  F/U 2-3 weeks  [name] B NP-C 8:11 PM

## 2013-05-04 ENCOUNTER — Encounter: Payer: Self-pay | Admitting: Internal Medicine

## 2013-05-15 ENCOUNTER — Encounter: Payer: 59 | Admitting: *Deleted

## 2013-05-18 ENCOUNTER — Encounter (HOSPITAL_COMMUNITY): Payer: BC Managed Care – PPO

## 2013-05-25 ENCOUNTER — Encounter: Payer: Self-pay | Admitting: *Deleted

## 2013-06-08 ENCOUNTER — Other Ambulatory Visit (HOSPITAL_COMMUNITY): Payer: Self-pay | Admitting: Internal Medicine

## 2013-09-05 ENCOUNTER — Other Ambulatory Visit (HOSPITAL_COMMUNITY): Payer: Self-pay

## 2013-09-05 MED ORDER — ISOSORBIDE MONONITRATE ER 60 MG PO TB24: ORAL_TABLET | ORAL | Status: DC

## 2013-09-05 MED ORDER — SPIRONOLACTONE 25 MG PO TABS: ORAL_TABLET | ORAL | Status: DC

## 2013-09-05 MED ORDER — LOSARTAN POTASSIUM 25 MG PO TABS: ORAL_TABLET | ORAL | Status: DC

## 2013-09-28 ENCOUNTER — Encounter (HOSPITAL_COMMUNITY): Payer: Self-pay | Admitting: Emergency Medicine

## 2013-09-28 ENCOUNTER — Emergency Department (HOSPITAL_COMMUNITY)
Admission: EM | Admit: 2013-09-28 | Discharge: 2013-09-28 | Disposition: A | Discharge status: Home / Self Care | Payer: 59 | Attending: Emergency Medicine | Admitting: Emergency Medicine

## 2013-09-28 ENCOUNTER — Emergency Department (HOSPITAL_COMMUNITY): Payer: 59

## 2013-09-28 DIAGNOSIS — R197 Diarrhea, unspecified: Secondary | ICD-10-CM | POA: Insufficient documentation

## 2013-09-28 DIAGNOSIS — Z3202 Encounter for pregnancy test, result negative: Secondary | ICD-10-CM | POA: Insufficient documentation

## 2013-09-28 DIAGNOSIS — J45909 Unspecified asthma, uncomplicated: Secondary | ICD-10-CM | POA: Insufficient documentation

## 2013-09-28 DIAGNOSIS — Z8679 Personal history of other diseases of the circulatory system: Secondary | ICD-10-CM | POA: Insufficient documentation

## 2013-09-28 DIAGNOSIS — I10 Essential (primary) hypertension: Secondary | ICD-10-CM | POA: Insufficient documentation

## 2013-09-28 DIAGNOSIS — Z79899 Other long term (current) drug therapy: Secondary | ICD-10-CM | POA: Insufficient documentation

## 2013-09-28 DIAGNOSIS — R112 Nausea with vomiting, unspecified: Secondary | ICD-10-CM | POA: Insufficient documentation

## 2013-09-28 DIAGNOSIS — R000 Tachycardia, unspecified: Secondary | ICD-10-CM | POA: Insufficient documentation

## 2013-09-28 DIAGNOSIS — R609 Edema, unspecified: Secondary | ICD-10-CM | POA: Insufficient documentation

## 2013-09-28 DIAGNOSIS — R1013 Epigastric pain: Secondary | ICD-10-CM

## 2013-09-28 DIAGNOSIS — Z9851 Tubal ligation status: Secondary | ICD-10-CM | POA: Insufficient documentation

## 2013-09-28 DIAGNOSIS — I509 Heart failure, unspecified: Secondary | ICD-10-CM | POA: Insufficient documentation

## 2013-09-28 DIAGNOSIS — R61 Generalized hyperhidrosis: Secondary | ICD-10-CM | POA: Insufficient documentation

## 2013-09-28 DIAGNOSIS — Z950 Presence of cardiac pacemaker: Secondary | ICD-10-CM | POA: Insufficient documentation

## 2013-09-28 LAB — CBC WITH DIFFERENTIAL/PLATELET
BASOS ABS: 0 10*3/uL (ref 0.0–0.1)
Basophils Relative: 0 % (ref 0–1)
Eosinophils Absolute: 0.1 10*3/uL (ref 0.0–0.7)
Eosinophils Relative: 1 % (ref 0–5)
HEMATOCRIT: 38.7 % (ref 36.0–46.0)
HEMOGLOBIN: 12.5 g/dL (ref 12.0–15.0)
LYMPHS PCT: 11 % — AB (ref 12–46)
Lymphs Abs: 0.8 10*3/uL (ref 0.7–4.0)
MCH: 26.6 pg (ref 26.0–34.0)
MCHC: 32.3 g/dL (ref 30.0–36.0)
MCV: 82.3 fL (ref 78.0–100.0)
MONO ABS: 0.6 10*3/uL (ref 0.1–1.0)
MONOS PCT: 8 % (ref 3–12)
NEUTROS ABS: 5.8 10*3/uL (ref 1.7–7.7)
Neutrophils Relative %: 80 % — ABNORMAL HIGH (ref 43–77)
Platelets: 197 10*3/uL (ref 150–400)
RBC: 4.7 MIL/uL (ref 3.87–5.11)
RDW: 16.7 % — ABNORMAL HIGH (ref 11.5–15.5)
WBC: 7.3 10*3/uL (ref 4.0–10.5)

## 2013-09-28 LAB — COMPREHENSIVE METABOLIC PANEL
ALK PHOS: 74 U/L (ref 39–117)
ALT: 50 U/L — ABNORMAL HIGH (ref 0–35)
AST: 83 U/L — AB (ref 0–37)
Albumin: 3.5 g/dL (ref 3.5–5.2)
BILIRUBIN TOTAL: 1 mg/dL (ref 0.3–1.2)
BUN: 16 mg/dL (ref 6–23)
CHLORIDE: 102 meq/L (ref 96–112)
CO2: 24 meq/L (ref 19–32)
CREATININE: 1.02 mg/dL (ref 0.50–1.10)
Calcium: 8.8 mg/dL (ref 8.4–10.5)
GFR calc Af Amer: 79 mL/min — ABNORMAL LOW (ref >90)
GFR, EST NON AFRICAN AMERICAN: 68 mL/min — AB (ref >90)
Glucose, Bld: 95 mg/dL (ref 70–99)
Potassium: 3.8 mEq/L (ref 3.7–5.3)
Sodium: 141 mEq/L (ref 137–147)
Total Protein: 7.4 g/dL (ref 6.0–8.3)

## 2013-09-28 LAB — URINALYSIS, ROUTINE W REFLEX MICROSCOPIC
Bilirubin Urine: NEGATIVE
GLUCOSE, UA: NEGATIVE mg/dL
HGB URINE DIPSTICK: NEGATIVE
Ketones, ur: NEGATIVE mg/dL
Leukocytes, UA: NEGATIVE
Nitrite: NEGATIVE
PH: 6 (ref 5.0–8.0)
PROTEIN: 100 mg/dL — AB
Specific Gravity, Urine: 1.022 (ref 1.005–1.030)
Urobilinogen, UA: 0.2 mg/dL (ref 0.0–1.0)

## 2013-09-28 LAB — POC URINE PREG, ED: PREG TEST UR: NEGATIVE

## 2013-09-28 LAB — URINE MICROSCOPIC-ADD ON

## 2013-09-28 LAB — I-STAT TROPONIN, ED: Troponin i, poc: 0.02 ng/mL (ref 0.00–0.08)

## 2013-09-28 LAB — LIPASE, BLOOD: Lipase: 15 U/L (ref 11–59)

## 2013-09-28 LAB — I-STAT CG4 LACTIC ACID, ED: LACTIC ACID, VENOUS: 1.44 mmol/L (ref 0.5–2.2)

## 2013-09-28 MED ORDER — MORPHINE SULFATE 4 MG/ML IJ SOLN
4.0000 mg | Freq: Once | INTRAMUSCULAR | Status: AC
  Administered 2013-09-28: 4 mg via INTRAVENOUS
  Filled 2013-09-28: qty 1

## 2013-09-28 MED ORDER — ONDANSETRON HCL 4 MG/2ML IJ SOLN
4.0000 mg | Freq: Once | INTRAMUSCULAR | Status: AC
  Administered 2013-09-28: 4 mg via INTRAVENOUS
  Filled 2013-09-28: qty 2

## 2013-09-28 MED ORDER — ONDANSETRON HCL 4 MG PO TABS: 4.0000 mg | ORAL_TABLET | Freq: Four times a day (QID) | ORAL | Status: DC

## 2013-09-28 NOTE — ED Provider Notes (Signed)
CSN: [id number]     Arrival date & time 09/28/13  1026 History   First MD Initiated Contact with Patient 09/28/13 1116     Chief Complaint  Patient presents with  . Abdominal Pain     (Consider location/radiation/quality/duration/timing/severity/associated sxs/prior Treatment) HPI  38 year old female with history of CHF, asthma, atrial fibrillation with pacemaker, not on anticoagulant presents c/o abd pain.  Patient report acute onset of sharp crampy right upper quadrant abdominal pain that wakes up around 2 AM this morning. States she felt diaphoretic, nauseous, vomited once and had some loose stools. Pain is been persistent, 9/10, worsened when she takes a deep breath or with certain movement.  She tries taking Zantac with minimal relief. She denies any fever, chills, chest pain, shortness of breath, productive cough, hemoptysis, back pain, dysuria, hematuria, hematochezia, or melena. She does not take NSAIDs on a regular basis. She has prior tubal ligation but has an intact gallbladder. She denies any lightheadedness or dizziness. She denies prior history of PE or DVT. She denies eating aggravating her pain.  Past Medical History  Diagnosis Date  . Hypertension     currently on Labetalol 200mg  BID  . Infection     UTI;not frequent;last infection was in June  . Infection     Yeast;not frequent  . Asthma     Inhaler prn;triggered by seasonal changes mostly cold weathert;last asthma  attack years ago  . CHF (congestive heart failure) 01/2006    Has Pacemaker and Defibrillator;was on heart meds;no longer taking since pregancy  . Anemia     Iron supplements in the past  . Migraines     Can't take Procardia XL, causes migraines  . Varicose veins 1998    Developed during last pregnancy  . Other primary cardiomyopathies   . Atrial fibrillation   . Paroxysmal ventricular tachycardia   . CHF (congestive heart failure)   . Hypertension   . Acute bronchitis and bronchiolitis    Past  Surgical History  Procedure Laterality Date  . Cesarean section  1998    d/t preeclampsia  . Breast reduction surgery  03/05/1994    d/t back, shoulder, and neck pain (42 F)  . Insert / replace / remove pacemaker   02/10/2006    Status post implantable cardioverter-defibrillator insertion  .Marland KitchenMarland KitchenMarland KitchenThe Medtronic Maximo VR, model 7232, single chamber  . Cardiac catheterization      EF 30-35%  . Cardiac defibrillator placement     Family History  Problem Relation Age of Onset  . Sickle cell trait Daughter   . Other Father     Varicose veins  . Asthma Daughter   . Seizures Maternal Uncle   . Migraines Mother   . Migraines Daughter   . Migraines Cousin     Maternal  . Cancer Paternal Grandmother     Stomach  . Cancer Maternal Uncle     Prostate  . Cancer Maternal Uncle     Lung  . Ulcers Mother   . Lupus Maternal Aunt   . Hypertension Father   . Hypertension Paternal Aunt   . Other Daughter     blood transfusion @ birth;platelets were low  . Rheum arthritis Mother   . Diabetes Father     Controlled w/ diet and exercise  . Diabetes Maternal Aunt   . Diabetes Paternal Aunt     x 2  . Diabetes Paternal Uncle   . Hypertension    . Heart disease     History  Substance Use Topics  . Smoking status: Never Smoker   . Smokeless tobacco: Never Used  . Alcohol Use: No   OB History   Grav Para Term Preterm Abortions TAB SAB Ect Mult Living   2 1  1      1      Review of Systems  All other systems reviewed and are negative.      Allergies  Oxycodone-acetaminophen and Percocet  Home Medications   Current Outpatient Rx  Name  Route  Sig  Dispense  Refill  . albuterol (PROVENTIL HFA;VENTOLIN HFA) 108 (90 BASE) MCG/ACT inhaler   Inhalation   Inhale 2 puffs into the lungs every 6 (six) hours as needed. Rescue inhaler         . carvedilol (COREG) 25 MG tablet   Oral   Take 1 tablet (25 mg total) by mouth 2 (two) times daily with a meal.   60 tablet   6   .  furosemide (LASIX) 80 MG tablet   Oral   Take 1 tablet (80 mg total) by mouth daily.   30 tablet   3   . hydrALAZINE (APRESOLINE) 50 MG tablet   Oral   Take 1.5 tablets (75 mg total) by mouth every 8 (eight) hours.   135 tablet   3   . isosorbide mononitrate (IMDUR) 60 MG 24 hr tablet   Oral   Take 60 mg by mouth daily.         Marland Kitchen losartan (COZAAR) 25 MG tablet   Oral   Take 25 mg by mouth daily.         . potassium chloride SA (K-DUR,KLOR-CON) 20 MEQ tablet   Oral   Take 20 mEq by mouth 2 (two) times daily.         . ranitidine (ZANTAC) 150 MG tablet   Oral   Take 150 mg by mouth daily.         Marland Kitchen spironolactone (ALDACTONE) 25 MG tablet      TAKE 1/2 TABLET BY MOUTH EVERY DAY   17 tablet   2    BP 155/103  Pulse 94  Temp(Src) 99.6 F (37.6 C) (Oral)  Resp 17  SpO2 98%  LMP 09/14/2013 Physical Exam  Nursing note and vitals reviewed. Constitutional: She appears well-developed and well-nourished. No distress.  Awake, alert, nontoxic appearance  HENT:  Head: Atraumatic.  Eyes: Conjunctivae are normal. Right eye exhibits no discharge. Left eye exhibits no discharge.  Neck: Neck supple.  Cardiovascular: Regular rhythm.   Tachycardia without murmurs, rubs, or gallops  Pulmonary/Chest: Effort normal. No respiratory distress. She has no wheezes. She has no rales. She exhibits no tenderness.  Abdominal: Soft. There is tenderness (Epigastric tenderness to palpation and mild right upper quadrant tenderness without guarding or rebound tenderness. Protuberant abdomen without distention. No overlying skin changes, no hernia noted). There is no rebound.  Musculoskeletal: She exhibits no tenderness.  ROM appears intact, no obvious focal weakness  1+ pitting edema to lower extremities bilaterally intact distal pulses.  Neurological:  Mental status and motor strength appears intact  Skin: No rash noted.  Psychiatric: She has a normal mood and affect.    ED Course   Procedures (including critical care time)  11:44 AM Patient presents with epigastric/right upper quadrant abdominal pain. Will evaluate for biliary disease. She does have history of CHF, she denies having any chest pain or shortness of breath. Has a remote history of atrial fibrillation but no prior history  of PE or DVT. Low suspicion for PE at this time. Workup initiated.  Care discussed with Dr. Wilson Singer  1:31 PM Pt resting comfortably, pain is well controlled. Vital signs stable. Pregnancy test is negative, normal lactic acid, normal troponin, EKG without acute ischemic changes, normal urinalysis, normal CBC, CMP is reassuring with very mild transaminitis, AST 83, ALT 50, normal lipase, abdominal ultrasound without any acute finding. Patient does have a PCP which I encourage close followup for further management. Strict return precautions discussed. Otherwise patient stable for discharge.  Labs Review Labs Reviewed  CBC WITH DIFFERENTIAL - Abnormal; Notable for the following:    RDW 16.7 (*)    Neutrophils Relative % 80 (*)    Lymphocytes Relative 11 (*)    All other components within normal limits  COMPREHENSIVE METABOLIC PANEL - Abnormal; Notable for the following:    AST 83 (*)    ALT 50 (*)    GFR calc non Af Amer 68 (*)    GFR calc Af Amer 79 (*)    All other components within normal limits  URINALYSIS, ROUTINE W REFLEX MICROSCOPIC - Abnormal; Notable for the following:    Protein, ur 100 (*)    All other components within normal limits  LIPASE, BLOOD  URINE MICROSCOPIC-ADD ON  I-STAT CG4 LACTIC ACID, ED  I-STAT TROPOININ, ED  POC URINE PREG, ED   Imaging Review US Abdomen Complete  09/28/2013   CLINICAL DATA:  Right upper quadrant discomfort with nausea and diarrhea.  EXAM: ULTRASOUND ABDOMEN COMPLETE  COMPARISON:  CT scan of the abdomen dated July 01, 2008  FINDINGS: Gallbladder:  No gallstones or wall thickening visualized. No sonographic Murphy sign noted.  Common bile  duct:  Diameter: 2.3 mm  Liver:  No focal lesion identified. Within normal limits in parenchymal echogenicity.  IVC:  No abnormality visualized.  Pancreas:  The observed portions of the pancreatic parenchyma appear normal. The pancreatic duct measures 3.1 mm.  Spleen:  Size and appearance within normal limits.  Right Kidney:  Length: 10.6 cm. Echogenicity within normal limits. No mass or hydronephrosis visualized. A simple appearing cyst exophytic from the lower pole measures 2.9 x 2.4 cm. This was demonstrated on the previous abdominal CT scan of December 2009.  Left Kidney:  Length: 9.9 cm. Echogenicity within normal limits. No mass or hydronephrosis visualized.  Abdominal aorta:  No aneurysm visualized.  Other findings:  No ascites is demonstrated.  IMPRESSION: 1. There is no evidence of acute hepatobiliary disease. 2. No acute abnormality of the kidneys or abdominal aorta is demonstrated.   Electronically Signed   By: David  Martinique   On: 09/28/2013 12:37     EKG Interpretation None      Date: 09/28/2013  Rate: 94  Rhythm: normal sinus rhythm  QRS Axis: normal  Intervals: borderline prolonged QT interval  ST/T Wave abnormalities: borderline ST changes in inferior leads  Conduction Disutrbances:none  Narrative Interpretation:   Old EKG Reviewed: unchanged    MDM   Final diagnoses:  Epigastric abdominal pain    BP 155/103  Pulse 94  Temp(Src) 99.6 F (37.6 C) (Oral)  Resp 17  SpO2 98%  LMP 09/14/2013  I have reviewed nursing notes and vital signs. I personally reviewed the imaging tests through PACS system  I reviewed available ER/hospitalization records thought the EMR     Domenic Moras, PA-C 09/28/13 [place], PA-C 09/28/13 1342

## 2013-09-28 NOTE — ED Notes (Signed)
Per pt, upper right abdominal pain for weeks-increase pain-N/V

## 2013-09-28 NOTE — Discharge Instructions (Signed)
Abdominal Pain, Women °Abdominal (stomach, pelvic, or belly) pain can be caused by many things. It is important to tell your doctor: °· The location of the pain. °· Does it come and go or is it present all the time? °· Are there things that start the pain (eating certain foods, exercise)? °· Are there other symptoms associated with the pain (fever, nausea, vomiting, diarrhea)? °All of this is helpful to know when trying to find the cause of the pain. °CAUSES  °· Stomach: virus or bacteria infection, or ulcer. °· Intestine: appendicitis (inflamed appendix), regional ileitis (Crohn's disease), ulcerative colitis (inflamed colon), irritable bowel syndrome, diverticulitis (inflamed diverticulum of the colon), or cancer of the stomach or intestine. °· Gallbladder disease or stones in the gallbladder. °· Kidney disease, kidney stones, or infection. °· Pancreas infection or cancer. °· Fibromyalgia (pain disorder). °· Diseases of the female organs: °· Uterus: fibroid (non-cancerous) tumors or infection. °· Fallopian tubes: infection or tubal pregnancy. °· Ovary: cysts or tumors. °· Pelvic adhesions (scar tissue). °· Endometriosis (uterus lining tissue growing in the pelvis and on the pelvic organs). °· Pelvic congestion syndrome (female organs filling up with blood just before the menstrual period). °· Pain with the menstrual period. °· Pain with ovulation (producing an egg). °· Pain with an IUD (intrauterine device, birth control) in the uterus. °· Cancer of the female organs. °· Functional pain (pain not caused by a disease, may improve without treatment). °· Psychological pain. °· Depression. °DIAGNOSIS  °Your doctor will decide the seriousness of your pain by doing an examination. °· Blood tests. °· X-rays. °· Ultrasound. °· CT scan (computed tomography, special type of X-ray). °· MRI (magnetic resonance imaging). °· Cultures, for infection. °· Barium enema (dye inserted in the large intestine, to better view it with  X-rays). °· Colonoscopy (looking in intestine with a lighted tube). °· Laparoscopy (minor surgery, looking in abdomen with a lighted tube). °· Major abdominal exploratory surgery (looking in abdomen with a large incision). °TREATMENT  °The treatment will depend on the cause of the pain.  °· Many cases can be observed and treated at home. °· Over-the-counter medicines recommended by your caregiver. °· Prescription medicine. °· Antibiotics, for infection. °· Birth control pills, for painful periods or for ovulation pain. °· Hormone treatment, for endometriosis. °· Nerve blocking injections. °· Physical therapy. °· Antidepressants. °· Counseling with a psychologist or psychiatrist. °· Minor or major surgery. °HOME CARE INSTRUCTIONS  °· Do not take laxatives, unless directed by your caregiver. °· Take over-the-counter pain medicine only if ordered by your caregiver. Do not take aspirin because it can cause an upset stomach or bleeding. °· Try a clear liquid diet (broth or water) as ordered by your caregiver. Slowly move to a bland diet, as tolerated, if the pain is related to the stomach or intestine. °· Have a thermometer and take your temperature several times a day, and record it. °· Bed rest and sleep, if it helps the pain. °· Avoid sexual intercourse, if it causes pain. °· Avoid stressful situations. °· Keep your follow-up appointments and tests, as your caregiver orders. °· If the pain does not go away with medicine or surgery, you may try: °· Acupuncture. °· Relaxation exercises (yoga, meditation). °· Group therapy. °· Counseling. °SEEK MEDICAL CARE IF:  °· You notice certain foods cause stomach pain. °· Your home care treatment is not helping your pain. °· You need stronger pain medicine. °· You want your IUD removed. °· You feel faint or   lightheaded. °· You develop nausea and vomiting. °· You develop a rash. °· You are having side effects or an allergy to your medicine. °SEEK IMMEDIATE MEDICAL CARE IF:  °· Your  pain does not go away or gets worse. °· You have a fever. °· Your pain is felt only in portions of the abdomen. The right side could possibly be appendicitis. The left lower portion of the abdomen could be colitis or diverticulitis. °· You are passing blood in your stools (bright red or black tarry stools, with or without vomiting). °· You have blood in your urine. °· You develop chills, with or without a fever. °· You pass out. °MAKE SURE YOU:  °· Understand these instructions. °· Will watch your condition. °· Will get help right away if you are not doing well or get worse. °Document Released: 05/10/2007 Document Revised: 10/05/2011 Document Reviewed: 05/30/2009 °[employer]® Patient Information ©2014 [employer]. ° °

## 2013-09-29 NOTE — ED Provider Notes (Signed)
Medical screening examination/treatment/procedure(s) were performed by non-physician practitioner and as supervising physician I was immediately available for consultation/collaboration.   EKG Interpretation None       Ottavio Norem, MD 09/29/13 1653 

## 2013-10-16 ENCOUNTER — Other Ambulatory Visit (HOSPITAL_COMMUNITY): Payer: Self-pay | Admitting: Adult Health

## 2013-11-20 ENCOUNTER — Emergency Department (HOSPITAL_COMMUNITY)
Admission: EM | Admit: 2013-11-20 | Discharge: 2013-11-20 | Disposition: A | Discharge status: Home / Self Care | Payer: 59 | Attending: Emergency Medicine | Admitting: Emergency Medicine

## 2013-11-20 ENCOUNTER — Emergency Department (HOSPITAL_COMMUNITY): Payer: 59

## 2013-11-20 ENCOUNTER — Encounter (HOSPITAL_COMMUNITY): Payer: Self-pay | Admitting: Emergency Medicine

## 2013-11-20 DIAGNOSIS — I472 Ventricular tachycardia, unspecified: Secondary | ICD-10-CM | POA: Insufficient documentation

## 2013-11-20 DIAGNOSIS — Z79899 Other long term (current) drug therapy: Secondary | ICD-10-CM | POA: Insufficient documentation

## 2013-11-20 DIAGNOSIS — Z87440 Personal history of urinary (tract) infections: Secondary | ICD-10-CM | POA: Insufficient documentation

## 2013-11-20 DIAGNOSIS — Z8619 Personal history of other infectious and parasitic diseases: Secondary | ICD-10-CM | POA: Insufficient documentation

## 2013-11-20 DIAGNOSIS — I4891 Unspecified atrial fibrillation: Secondary | ICD-10-CM | POA: Insufficient documentation

## 2013-11-20 DIAGNOSIS — Z95810 Presence of automatic (implantable) cardiac defibrillator: Secondary | ICD-10-CM | POA: Insufficient documentation

## 2013-11-20 DIAGNOSIS — Z9889 Other specified postprocedural states: Secondary | ICD-10-CM | POA: Insufficient documentation

## 2013-11-20 DIAGNOSIS — I4729 Other ventricular tachycardia: Secondary | ICD-10-CM | POA: Insufficient documentation

## 2013-11-20 DIAGNOSIS — Z862 Personal history of diseases of the blood and blood-forming organs and certain disorders involving the immune mechanism: Secondary | ICD-10-CM | POA: Insufficient documentation

## 2013-11-20 DIAGNOSIS — J45901 Unspecified asthma with (acute) exacerbation: Secondary | ICD-10-CM | POA: Insufficient documentation

## 2013-11-20 DIAGNOSIS — I509 Heart failure, unspecified: Secondary | ICD-10-CM | POA: Insufficient documentation

## 2013-11-20 DIAGNOSIS — I10 Essential (primary) hypertension: Secondary | ICD-10-CM | POA: Insufficient documentation

## 2013-11-20 LAB — CBC WITH DIFFERENTIAL/PLATELET
BASOS ABS: 0 10*3/uL (ref 0.0–0.1)
BASOS PCT: 0 % (ref 0–1)
EOS ABS: 0.3 10*3/uL (ref 0.0–0.7)
Eosinophils Relative: 4 % (ref 0–5)
HCT: 38.6 % (ref 36.0–46.0)
Hemoglobin: 12.1 g/dL (ref 12.0–15.0)
Lymphocytes Relative: 35 % (ref 12–46)
Lymphs Abs: 2.4 10*3/uL (ref 0.7–4.0)
MCH: 26 pg (ref 26.0–34.0)
MCHC: 31.3 g/dL (ref 30.0–36.0)
MCV: 82.8 fL (ref 78.0–100.0)
Monocytes Absolute: 0.5 10*3/uL (ref 0.1–1.0)
Monocytes Relative: 7 % (ref 3–12)
NEUTROS ABS: 3.8 10*3/uL (ref 1.7–7.7)
Neutrophils Relative %: 54 % (ref 43–77)
PLATELETS: 269 10*3/uL (ref 150–400)
RBC: 4.66 MIL/uL (ref 3.87–5.11)
RDW: 16.4 % — AB (ref 11.5–15.5)
WBC: 6.9 10*3/uL (ref 4.0–10.5)

## 2013-11-20 LAB — BASIC METABOLIC PANEL
BUN: 18 mg/dL (ref 6–23)
CO2: 26 mEq/L (ref 19–32)
Calcium: 8.8 mg/dL (ref 8.4–10.5)
Chloride: 105 mEq/L (ref 96–112)
Creatinine, Ser: 1.18 mg/dL — ABNORMAL HIGH (ref 0.50–1.10)
GFR, EST AFRICAN AMERICAN: 66 mL/min — AB (ref >90)
GFR, EST NON AFRICAN AMERICAN: 57 mL/min — AB (ref >90)
Glucose, Bld: 111 mg/dL — ABNORMAL HIGH (ref 70–99)
POTASSIUM: 4.1 meq/L (ref 3.7–5.3)
SODIUM: 143 meq/L (ref 137–147)

## 2013-11-20 LAB — PRO B NATRIURETIC PEPTIDE: PRO B NATRI PEPTIDE: 1242 pg/mL — AB (ref 0–125)

## 2013-11-20 LAB — I-STAT TROPONIN, ED: TROPONIN I, POC: 0.02 ng/mL (ref 0.00–0.08)

## 2013-11-20 MED ORDER — METHYLPREDNISOLONE SODIUM SUCC 125 MG IJ SOLR
125.0000 mg | Freq: Once | INTRAMUSCULAR | Status: AC
  Administered 2013-11-20: 125 mg via INTRAVENOUS
  Filled 2013-11-20: qty 2

## 2013-11-20 MED ORDER — PREDNISONE 20 MG PO TABS: ORAL_TABLET | ORAL | Status: DC

## 2013-11-20 MED ORDER — IPRATROPIUM BROMIDE 0.02 % IN SOLN
1.0000 mg | Freq: Once | RESPIRATORY_TRACT | Status: AC
  Administered 2013-11-20: 1 mg via RESPIRATORY_TRACT
  Filled 2013-11-20: qty 5

## 2013-11-20 MED ORDER — ALBUTEROL (5 MG/ML) CONTINUOUS INHALATION SOLN
15.0000 mg/h | INHALATION_SOLUTION | Freq: Once | RESPIRATORY_TRACT | Status: AC
  Administered 2013-11-20: 15 mg/h via RESPIRATORY_TRACT
  Filled 2013-11-20: qty 20

## 2013-11-20 MED ORDER — IPRATROPIUM-ALBUTEROL 0.5-2.5 (3) MG/3ML IN SOLN
RESPIRATORY_TRACT | Status: AC
  Filled 2013-11-20: qty 3

## 2013-11-20 MED ORDER — ALBUTEROL SULFATE HFA 108 (90 BASE) MCG/ACT IN AERS
2.0000 | INHALATION_SPRAY | Freq: Once | RESPIRATORY_TRACT | Status: AC
  Administered 2013-11-20: 2 via RESPIRATORY_TRACT
  Filled 2013-11-20: qty 6.7

## 2013-11-20 NOTE — Discharge Instructions (Signed)
Take prednisone as prescribed.   Use albuterol every 4 hrs for 2 days then as needed.   Follow up with your doctor.   Return to ER if you have worse shortness of breath, chest pain, wheezing.

## 2013-11-20 NOTE — ED Notes (Signed)
resp treatment completed. resp easier-- able to speak in complete sentences now

## 2013-11-20 NOTE — ED Provider Notes (Signed)
CSN: [id number]     Arrival date & time 11/20/13  7829 History   First MD Initiated Contact with Patient 11/20/13 [phone]     Chief Complaint  Patient presents with  . Shortness of Breath     (Consider location/radiation/quality/duration/timing/severity/associated sxs/prior Treatment) The history is provided by the patient.  [name] is a 38 y.o. female hx of HTN, ashtma, CHF, afib not on coumadin here with shortness of breath. Shortness of breath and wheezing for the last 2 weeks. She has been unable to sleep due to shortness of breath for the last 3 days. Had some nonproductive cough. Denies chest pain. Denies leg swelling. She had similar episode a month ago and improved with steroids, nebs. Never hospitalized for asthma but had been hospitalized for CHF previously.    Past Medical History  Diagnosis Date  . Hypertension     currently on Labetalol 200mg  BID  . Infection     UTI;not frequent;last infection was in June  . Infection     Yeast;not frequent  . Asthma     Inhaler prn;triggered by seasonal changes mostly cold weathert;last asthma  attack years ago  . CHF (congestive heart failure) 01/2006    Has Pacemaker and Defibrillator;was on heart meds;no longer taking since pregancy  . Anemia     Iron supplements in the past  . Migraines     Can't take Procardia XL, causes migraines  . Varicose veins 1998    Developed during last pregnancy  . Other primary cardiomyopathies   . Atrial fibrillation   . Paroxysmal ventricular tachycardia   . CHF (congestive heart failure)   . Hypertension   . Acute bronchitis and bronchiolitis    Past Surgical History  Procedure Laterality Date  . Cesarean section  1998    d/t preeclampsia  . Breast reduction surgery  03/05/1994    d/t back, shoulder, and neck pain (42 F)  . Insert / replace / remove pacemaker   02/10/2006    Status post implantable cardioverter-defibrillator insertion  .Marland KitchenMarland KitchenMarland KitchenThe Medtronic Maximo VR, model 7232, single  chamber  . Cardiac catheterization      EF 30-35%  . Cardiac defibrillator placement     Family History  Problem Relation Age of Onset  . Sickle cell trait Daughter   . Other Father     Varicose veins  . Asthma Daughter   . Seizures Maternal Uncle   . Migraines Mother   . Migraines Daughter   . Migraines Cousin     Maternal  . Cancer Paternal Grandmother     Stomach  . Cancer Maternal Uncle     Prostate  . Cancer Maternal Uncle     Lung  . Ulcers Mother   . Lupus Maternal Aunt   . Hypertension Father   . Hypertension Paternal Aunt   . Other Daughter     blood transfusion @ birth;platelets were low  . Rheum arthritis Mother   . Diabetes Father     Controlled w/ diet and exercise  . Diabetes Maternal Aunt   . Diabetes Paternal Aunt     x 2  . Diabetes Paternal Uncle   . Hypertension    . Heart disease     History  Substance Use Topics  . Smoking status: Never Smoker   . Smokeless tobacco: Never Used  . Alcohol Use: No   OB History   Grav Para Term Preterm Abortions TAB SAB Ect Mult Living   2 1  1  1     Review of Systems  Respiratory: Positive for shortness of breath and wheezing.   All other systems reviewed and are negative.     Allergies  Oxycodone-acetaminophen and Percocet  Home Medications   Prior to Admission medications   Medication Sig Start Date End Date Taking? Authorizing Provider  albuterol (PROVENTIL HFA;VENTOLIN HFA) 108 (90 BASE) MCG/ACT inhaler Inhale 2 puffs into the lungs every 6 (six) hours as needed. Rescue inhaler    Historical Provider, MD  carvedilol (COREG) 25 MG tablet Take 1 tablet (25 mg total) by mouth 2 (two) times daily with a meal. 05/03/13   Rande Brunt, NP  furosemide (LASIX) 80 MG tablet Take 1 tablet (80 mg total) by mouth daily. MUST HAVE FOLLOW UP APPOINTMENT FOR FURTHER REFILLS 10/16/13   Jolaine Artist, MD  hydrALAZINE (APRESOLINE) 50 MG tablet Take 1.5 tablets (75 mg total) by mouth every 8 (eight)  hours. 12/14/12   Amy D Ninfa Meeker, NP  isosorbide mononitrate (IMDUR) 60 MG 24 hr tablet Take 60 mg by mouth daily.    Historical Provider, MD  losartan (COZAAR) 25 MG tablet Take 25 mg by mouth daily.    Historical Provider, MD  ondansetron (ZOFRAN) 4 MG tablet Take 1 tablet (4 mg total) by mouth every 6 (six) hours. 09/28/13   Domenic Moras, PA-C  potassium chloride SA (K-DUR,KLOR-CON) 20 MEQ tablet Take 20 mEq by mouth 2 (two) times daily.    Historical Provider, MD  ranitidine (ZANTAC) 150 MG tablet Take 150 mg by mouth daily.    Historical Provider, MD  spironolactone (ALDACTONE) 25 MG tablet TAKE 1/2 TABLET BY MOUTH EVERY DAY 09/05/13   Jolaine Artist, MD   BP 177/115  Pulse 97  Temp(Src) 97.3 F (36.3 C)  Resp 40  Wt 231 lb (104.781 kg)  SpO2 93%  LMP 11/06/2013 Physical Exam  Nursing note and vitals reviewed. Constitutional: She is oriented to person, place, and time.  Tachypneic, talking in full sentences   HENT:  Head: Normocephalic.  Mouth/Throat: Oropharynx is clear and moist.  Eyes: Conjunctivae are normal. Pupils are equal, round, and reactive to light.  Neck: Normal range of motion. Neck supple.  Cardiovascular: Normal rate and regular rhythm.   Pulmonary/Chest:  Tachypneic, diffuse wheezing. No retractions.   Abdominal: Soft. Bowel sounds are normal. She exhibits no distension. There is no tenderness. There is no rebound and no guarding.  Musculoskeletal: Normal range of motion.  Trace edema bilateral legs   Neurological: She is alert and oriented to person, place, and time. No cranial nerve deficit. Coordination normal.  Skin: Skin is warm and dry.  Psychiatric: She has a normal mood and affect. Her behavior is normal. Judgment and thought content normal.    ED Course  Procedures (including critical care time) Labs Review Labs Reviewed  CBC WITH DIFFERENTIAL - Abnormal; Notable for the following:    RDW 16.4 (*)    All other components within normal limits  BASIC  METABOLIC PANEL - Abnormal; Notable for the following:    Glucose, Bld 111 (*)    Creatinine, Ser 1.18 (*)    GFR calc non Af Amer 57 (*)    GFR calc Af Amer 66 (*)    All other components within normal limits  PRO B NATRIURETIC PEPTIDE - Abnormal; Notable for the following:    Pro B Natriuretic peptide (BNP) 1242.0 (*)    All other components within normal limits  Randolm Idol, ED  Imaging Review Dg Chest Portable 1 View  11/20/2013   CLINICAL DATA:  Shortness of breath, worsening cough for 2 weeks, history asthma, hypertension, CHF, atrial fibrillation, bronchitis  EXAM: PORTABLE CHEST - 1 VIEW  COMPARISON:  Portable exam 0920 hr compared to 12/12/2012  FINDINGS: LEFT subclavian AICD lead tip projects over RIGHT ventricle.  Enlargement of cardiac silhouette.  Mediastinal contours and pulmonary vascularity normal.  Improved pulmonary edema versus previous exam.  No definite acute infiltrate, pleural effusion or pneumothorax.  Bones unremarkable.  IMPRESSION: Enlargement of cardiac silhouette.  Resolution of pulmonary edema versus previous exam.  No acute abnormalities.   Electronically Signed   By: Lavonia Dana M.D.   On: 11/20/2013 09:31     EKG Interpretation   Date/Time:  Monday November 20 2013 09:53:15 EDT Ventricular Rate:  89 PR Interval:  193 QRS Duration: 105 QT Interval:  405 QTC Calculation: 493 R Axis:   16 Text Interpretation:  Sinus rhythm Probable left atrial enlargement LVH  with secondary repolarization abnormality Borderline prolonged QT interval  No significant change since last tracing Confirmed by YAO  MD, DAVID  ([id number]) on 11/20/2013 9:57:06 AM      MDM   Final diagnoses:  None   [name] is a 38 y.o. female here with SOB. Consider asthma vs CHF. Will place on continuous nebs, give IV steroids. Will check BNP and labs and get cxr. Will reassess.   11:53 AM After steroids, continuous nebs, not hypoxic (97%) with ambulation. Slight wheezing but felt  better. BNp at baseline. Pulmonary edema resolved on cxr. Will d/c home with prednisone, albuterol.   Wandra Arthurs, MD 11/20/13 1154

## 2013-11-20 NOTE — ED Notes (Signed)
Ambulate pt.off the monitor sats was 97%heartrate 96

## 2013-11-28 ENCOUNTER — Other Ambulatory Visit (HOSPITAL_COMMUNITY): Payer: Self-pay | Admitting: Physician Assistant

## 2013-12-07 ENCOUNTER — Other Ambulatory Visit: Payer: Self-pay

## 2013-12-07 ENCOUNTER — Emergency Department (HOSPITAL_COMMUNITY): Payer: 59

## 2013-12-07 ENCOUNTER — Encounter (HOSPITAL_COMMUNITY): Payer: Self-pay | Admitting: Emergency Medicine

## 2013-12-07 ENCOUNTER — Inpatient Hospital Stay (HOSPITAL_COMMUNITY)
Admission: EM | Admit: 2013-12-07 | Discharge: 2013-12-09 | DRG: 292 | Disposition: A | Discharge status: Home / Self Care | Payer: 59 | Attending: Family Medicine | Admitting: Family Medicine

## 2013-12-07 DIAGNOSIS — I472 Ventricular tachycardia, unspecified: Secondary | ICD-10-CM

## 2013-12-07 DIAGNOSIS — Z8249 Family history of ischemic heart disease and other diseases of the circulatory system: Secondary | ICD-10-CM

## 2013-12-07 DIAGNOSIS — Z79899 Other long term (current) drug therapy: Secondary | ICD-10-CM

## 2013-12-07 DIAGNOSIS — O4420 Partial placenta previa NOS or without hemorrhage, unspecified trimester: Secondary | ICD-10-CM

## 2013-12-07 DIAGNOSIS — Z411 Encounter for cosmetic surgery: Secondary | ICD-10-CM

## 2013-12-07 DIAGNOSIS — Z8042 Family history of malignant neoplasm of prostate: Secondary | ICD-10-CM

## 2013-12-07 DIAGNOSIS — I5033 Acute on chronic diastolic (congestive) heart failure: Secondary | ICD-10-CM

## 2013-12-07 DIAGNOSIS — O09299 Supervision of pregnancy with other poor reproductive or obstetric history, unspecified trimester: Secondary | ICD-10-CM

## 2013-12-07 DIAGNOSIS — Z800 Family history of malignant neoplasm of digestive organs: Secondary | ICD-10-CM

## 2013-12-07 DIAGNOSIS — R42 Dizziness and giddiness: Secondary | ICD-10-CM

## 2013-12-07 DIAGNOSIS — I509 Heart failure, unspecified: Secondary | ICD-10-CM | POA: Diagnosis present

## 2013-12-07 DIAGNOSIS — D649 Anemia, unspecified: Secondary | ICD-10-CM

## 2013-12-07 DIAGNOSIS — O4690 Antepartum hemorrhage, unspecified, unspecified trimester: Secondary | ICD-10-CM

## 2013-12-07 DIAGNOSIS — Z825 Family history of asthma and other chronic lower respiratory diseases: Secondary | ICD-10-CM

## 2013-12-07 DIAGNOSIS — J45909 Unspecified asthma, uncomplicated: Secondary | ICD-10-CM | POA: Diagnosis present

## 2013-12-07 DIAGNOSIS — Z95810 Presence of automatic (implantable) cardiac defibrillator: Secondary | ICD-10-CM

## 2013-12-07 DIAGNOSIS — I10 Essential (primary) hypertension: Secondary | ICD-10-CM | POA: Diagnosis present

## 2013-12-07 DIAGNOSIS — R0989 Other specified symptoms and signs involving the circulatory and respiratory systems: Secondary | ICD-10-CM | POA: Diagnosis not present

## 2013-12-07 DIAGNOSIS — O4450 Low lying placenta with hemorrhage, unspecified trimester: Secondary | ICD-10-CM

## 2013-12-07 DIAGNOSIS — J9600 Acute respiratory failure, unspecified whether with hypoxia or hypercapnia: Secondary | ICD-10-CM

## 2013-12-07 DIAGNOSIS — J209 Acute bronchitis, unspecified: Secondary | ICD-10-CM

## 2013-12-07 DIAGNOSIS — Z8261 Family history of arthritis: Secondary | ICD-10-CM

## 2013-12-07 DIAGNOSIS — I5023 Acute on chronic systolic (congestive) heart failure: Secondary | ICD-10-CM | POA: Diagnosis not present

## 2013-12-07 DIAGNOSIS — O34219 Maternal care for unspecified type scar from previous cesarean delivery: Secondary | ICD-10-CM

## 2013-12-07 DIAGNOSIS — I169 Hypertensive crisis, unspecified: Secondary | ICD-10-CM

## 2013-12-07 DIAGNOSIS — Z950 Presence of cardiac pacemaker: Secondary | ICD-10-CM

## 2013-12-07 DIAGNOSIS — I4891 Unspecified atrial fibrillation: Secondary | ICD-10-CM

## 2013-12-07 DIAGNOSIS — Z302 Encounter for sterilization: Secondary | ICD-10-CM

## 2013-12-07 DIAGNOSIS — B9689 Other specified bacterial agents as the cause of diseases classified elsewhere: Secondary | ICD-10-CM

## 2013-12-07 DIAGNOSIS — I428 Other cardiomyopathies: Secondary | ICD-10-CM

## 2013-12-07 DIAGNOSIS — N83201 Unspecified ovarian cyst, right side: Secondary | ICD-10-CM

## 2013-12-07 DIAGNOSIS — N179 Acute kidney failure, unspecified: Secondary | ICD-10-CM

## 2013-12-07 DIAGNOSIS — O169 Unspecified maternal hypertension, unspecified trimester: Secondary | ICD-10-CM

## 2013-12-07 DIAGNOSIS — I4729 Other ventricular tachycardia: Secondary | ICD-10-CM

## 2013-12-07 DIAGNOSIS — Z801 Family history of malignant neoplasm of trachea, bronchus and lung: Secondary | ICD-10-CM

## 2013-12-07 DIAGNOSIS — N760 Acute vaginitis: Secondary | ICD-10-CM

## 2013-12-07 DIAGNOSIS — I5022 Chronic systolic (congestive) heart failure: Secondary | ICD-10-CM

## 2013-12-07 DIAGNOSIS — N83202 Unspecified ovarian cyst, left side: Secondary | ICD-10-CM

## 2013-12-07 DIAGNOSIS — D539 Nutritional anemia, unspecified: Secondary | ICD-10-CM | POA: Diagnosis present

## 2013-12-07 DIAGNOSIS — R0609 Other forms of dyspnea: Secondary | ICD-10-CM | POA: Diagnosis not present

## 2013-12-07 DIAGNOSIS — Z888 Allergy status to other drugs, medicaments and biological substances status: Secondary | ICD-10-CM

## 2013-12-07 DIAGNOSIS — I119 Hypertensive heart disease without heart failure: Secondary | ICD-10-CM

## 2013-12-07 DIAGNOSIS — O0990 Supervision of high risk pregnancy, unspecified, unspecified trimester: Secondary | ICD-10-CM

## 2013-12-07 DIAGNOSIS — Z832 Family history of diseases of the blood and blood-forming organs and certain disorders involving the immune mechanism: Secondary | ICD-10-CM

## 2013-12-07 DIAGNOSIS — I43 Cardiomyopathy in diseases classified elsewhere: Secondary | ICD-10-CM

## 2013-12-07 DIAGNOSIS — Z833 Family history of diabetes mellitus: Secondary | ICD-10-CM

## 2013-12-07 MED ORDER — IPRATROPIUM-ALBUTEROL 0.5-2.5 (3) MG/3ML IN SOLN: 3.0000 mL | Freq: Once | RESPIRATORY_TRACT | Status: DC

## 2013-12-07 MED ORDER — IPRATROPIUM-ALBUTEROL 0.5-2.5 (3) MG/3ML IN SOLN
3.0000 mL | Freq: Once | RESPIRATORY_TRACT | Status: AC
  Administered 2013-12-07: 3 mL via RESPIRATORY_TRACT
  Filled 2013-12-07: qty 3

## 2013-12-07 MED ORDER — ALBUTEROL (5 MG/ML) CONTINUOUS INHALATION SOLN
5.0000 mg/h | INHALATION_SOLUTION | Freq: Once | RESPIRATORY_TRACT | Status: AC
  Administered 2013-12-08: 5 mg/h via RESPIRATORY_TRACT
  Filled 2013-12-07: qty 20

## 2013-12-07 MED ORDER — PREDNISONE 20 MG PO TABS
60.0000 mg | ORAL_TABLET | Freq: Once | ORAL | Status: AC
  Administered 2013-12-07: 60 mg via ORAL
  Filled 2013-12-07: qty 3

## 2013-12-07 NOTE — ED Notes (Signed)
Pt reports severe difficulty breathing, with hx of asthma.  Pt reports being seen in the ED for same x 2 weeks ago.  Not getting better.  Pt has used her inhaler without relief.  Pt states that she has a nebulizer but she cannot find it.  Actively coughing.  Denies being intubated in the past.

## 2013-12-07 NOTE — ED Notes (Signed)
Patient c/o ongoing wheezing for a few weeks, states it worsened today at work. Patient reports using her inhaler at home without relief.

## 2013-12-07 NOTE — ED Notes (Signed)
RT notified of pending orders.  

## 2013-12-08 ENCOUNTER — Encounter (HOSPITAL_COMMUNITY): Payer: Self-pay | Admitting: Internal Medicine

## 2013-12-08 ENCOUNTER — Inpatient Hospital Stay (HOSPITAL_COMMUNITY): Payer: 59

## 2013-12-08 DIAGNOSIS — I5023 Acute on chronic systolic (congestive) heart failure: Principal | ICD-10-CM

## 2013-12-08 DIAGNOSIS — Z888 Allergy status to other drugs, medicaments and biological substances status: Secondary | ICD-10-CM | POA: Diagnosis not present

## 2013-12-08 DIAGNOSIS — Z833 Family history of diabetes mellitus: Secondary | ICD-10-CM | POA: Diagnosis not present

## 2013-12-08 DIAGNOSIS — I472 Ventricular tachycardia: Secondary | ICD-10-CM | POA: Diagnosis not present

## 2013-12-08 DIAGNOSIS — Z411 Encounter for cosmetic surgery: Secondary | ICD-10-CM | POA: Diagnosis not present

## 2013-12-08 DIAGNOSIS — D649 Anemia, unspecified: Secondary | ICD-10-CM

## 2013-12-08 DIAGNOSIS — I4891 Unspecified atrial fibrillation: Secondary | ICD-10-CM | POA: Diagnosis present

## 2013-12-08 DIAGNOSIS — I509 Heart failure, unspecified: Secondary | ICD-10-CM | POA: Diagnosis present

## 2013-12-08 DIAGNOSIS — R0989 Other specified symptoms and signs involving the circulatory and respiratory systems: Secondary | ICD-10-CM | POA: Diagnosis present

## 2013-12-08 DIAGNOSIS — Z95810 Presence of automatic (implantable) cardiac defibrillator: Secondary | ICD-10-CM | POA: Diagnosis not present

## 2013-12-08 DIAGNOSIS — R0609 Other forms of dyspnea: Secondary | ICD-10-CM | POA: Diagnosis present

## 2013-12-08 DIAGNOSIS — I10 Essential (primary) hypertension: Secondary | ICD-10-CM

## 2013-12-08 DIAGNOSIS — Z801 Family history of malignant neoplasm of trachea, bronchus and lung: Secondary | ICD-10-CM | POA: Diagnosis not present

## 2013-12-08 DIAGNOSIS — Z8042 Family history of malignant neoplasm of prostate: Secondary | ICD-10-CM | POA: Diagnosis not present

## 2013-12-08 DIAGNOSIS — Z79899 Other long term (current) drug therapy: Secondary | ICD-10-CM | POA: Diagnosis not present

## 2013-12-08 DIAGNOSIS — I4729 Other ventricular tachycardia: Secondary | ICD-10-CM | POA: Diagnosis not present

## 2013-12-08 DIAGNOSIS — Z825 Family history of asthma and other chronic lower respiratory diseases: Secondary | ICD-10-CM | POA: Diagnosis not present

## 2013-12-08 DIAGNOSIS — J45909 Unspecified asthma, uncomplicated: Secondary | ICD-10-CM | POA: Diagnosis present

## 2013-12-08 DIAGNOSIS — Z8261 Family history of arthritis: Secondary | ICD-10-CM | POA: Diagnosis not present

## 2013-12-08 DIAGNOSIS — Z800 Family history of malignant neoplasm of digestive organs: Secondary | ICD-10-CM | POA: Diagnosis not present

## 2013-12-08 DIAGNOSIS — Z832 Family history of diseases of the blood and blood-forming organs and certain disorders involving the immune mechanism: Secondary | ICD-10-CM | POA: Diagnosis not present

## 2013-12-08 DIAGNOSIS — Z8249 Family history of ischemic heart disease and other diseases of the circulatory system: Secondary | ICD-10-CM | POA: Diagnosis not present

## 2013-12-08 LAB — BASIC METABOLIC PANEL
BUN: 18 mg/dL (ref 6–23)
CO2: 26 meq/L (ref 19–32)
CREATININE: 1.26 mg/dL — AB (ref 0.50–1.10)
Calcium: 8.5 mg/dL (ref 8.4–10.5)
Chloride: 104 mEq/L (ref 96–112)
GFR calc Af Amer: 61 mL/min — ABNORMAL LOW (ref >90)
GFR calc non Af Amer: 53 mL/min — ABNORMAL LOW (ref >90)
Glucose, Bld: 102 mg/dL — ABNORMAL HIGH (ref 70–99)
Potassium: 3.7 mEq/L (ref 3.7–5.3)
Sodium: 142 mEq/L (ref 137–147)

## 2013-12-08 LAB — CBC WITH DIFFERENTIAL/PLATELET
BASOS ABS: 0 10*3/uL (ref 0.0–0.1)
BASOS ABS: 0 10*3/uL (ref 0.0–0.1)
BASOS PCT: 0 % (ref 0–1)
Basophils Relative: 0 % (ref 0–1)
EOS ABS: 0 10*3/uL (ref 0.0–0.7)
Eosinophils Absolute: 0.1 10*3/uL (ref 0.0–0.7)
Eosinophils Relative: 0 % (ref 0–5)
Eosinophils Relative: 2 % (ref 0–5)
HCT: 35.9 % — ABNORMAL LOW (ref 36.0–46.0)
HEMATOCRIT: 38.2 % (ref 36.0–46.0)
Hemoglobin: 11.2 g/dL — ABNORMAL LOW (ref 12.0–15.0)
Hemoglobin: 12.1 g/dL (ref 12.0–15.0)
Lymphocytes Relative: 24 % (ref 12–46)
Lymphocytes Relative: 7 % — ABNORMAL LOW (ref 12–46)
Lymphs Abs: 0.4 10*3/uL — ABNORMAL LOW (ref 0.7–4.0)
Lymphs Abs: 1.3 10*3/uL (ref 0.7–4.0)
MCH: 25.6 pg — ABNORMAL LOW (ref 26.0–34.0)
MCH: 26.1 pg (ref 26.0–34.0)
MCHC: 31.2 g/dL (ref 30.0–36.0)
MCHC: 31.7 g/dL (ref 30.0–36.0)
MCV: 82.2 fL (ref 78.0–100.0)
MCV: 82.5 fL (ref 78.0–100.0)
MONO ABS: 0.4 10*3/uL (ref 0.1–1.0)
Monocytes Absolute: 0.1 10*3/uL (ref 0.1–1.0)
Monocytes Relative: 2 % — ABNORMAL LOW (ref 3–12)
Monocytes Relative: 7 % (ref 3–12)
NEUTROS ABS: 3.7 10*3/uL (ref 1.7–7.7)
Neutro Abs: 6.2 10*3/uL (ref 1.7–7.7)
Neutrophils Relative %: 67 % (ref 43–77)
Neutrophils Relative %: 92 % — ABNORMAL HIGH (ref 43–77)
PLATELETS: 195 10*3/uL (ref 150–400)
Platelets: 216 10*3/uL (ref 150–400)
RBC: 4.37 MIL/uL (ref 3.87–5.11)
RBC: 4.63 MIL/uL (ref 3.87–5.11)
RDW: 16.9 % — AB (ref 11.5–15.5)
RDW: 17 % — AB (ref 11.5–15.5)
WBC: 5.5 10*3/uL (ref 4.0–10.5)
WBC: 6.7 10*3/uL (ref 4.0–10.5)

## 2013-12-08 LAB — URINALYSIS, ROUTINE W REFLEX MICROSCOPIC
Bilirubin Urine: NEGATIVE
Glucose, UA: NEGATIVE mg/dL
KETONES UR: NEGATIVE mg/dL
Leukocytes, UA: NEGATIVE
NITRITE: NEGATIVE
Protein, ur: NEGATIVE mg/dL
Specific Gravity, Urine: 1.007 (ref 1.005–1.030)
Urobilinogen, UA: 0.2 mg/dL (ref 0.0–1.0)
pH: 5.5 (ref 5.0–8.0)

## 2013-12-08 LAB — COMPREHENSIVE METABOLIC PANEL
ALT: 22 U/L (ref 0–35)
AST: 23 U/L (ref 0–37)
Albumin: 3.5 g/dL (ref 3.5–5.2)
Alkaline Phosphatase: 60 U/L (ref 39–117)
BUN: 17 mg/dL (ref 6–23)
CO2: 25 mEq/L (ref 19–32)
Calcium: 8.7 mg/dL (ref 8.4–10.5)
Chloride: 104 mEq/L (ref 96–112)
Creatinine, Ser: 1.24 mg/dL — ABNORMAL HIGH (ref 0.50–1.10)
GFR calc Af Amer: 63 mL/min — ABNORMAL LOW (ref >90)
GFR calc non Af Amer: 54 mL/min — ABNORMAL LOW (ref >90)
GLUCOSE: 175 mg/dL — AB (ref 70–99)
POTASSIUM: 3.6 meq/L — AB (ref 3.7–5.3)
SODIUM: 143 meq/L (ref 137–147)
TOTAL PROTEIN: 7.1 g/dL (ref 6.0–8.3)
Total Bilirubin: 1 mg/dL (ref 0.3–1.2)

## 2013-12-08 LAB — URINE MICROSCOPIC-ADD ON

## 2013-12-08 LAB — PRO B NATRIURETIC PEPTIDE: PRO B NATRI PEPTIDE: 4070 pg/mL — AB (ref 0–125)

## 2013-12-08 LAB — I-STAT TROPONIN, ED: Troponin i, poc: 0.04 ng/mL (ref 0.00–0.08)

## 2013-12-08 LAB — TROPONIN I: Troponin I: <0.3 ng/mL (ref <0.30)

## 2013-12-08 LAB — TSH: TSH: 1.22 u[IU]/mL (ref 0.350–4.500)

## 2013-12-08 LAB — MAGNESIUM: Magnesium: 2.1 mg/dL (ref 1.5–2.5)

## 2013-12-08 LAB — PREGNANCY, URINE: Preg Test, Ur: NEGATIVE

## 2013-12-08 MED ORDER — FUROSEMIDE 10 MG/ML IJ SOLN
80.0000 mg | Freq: Once | INTRAMUSCULAR | Status: AC
  Administered 2013-12-08: 80 mg via INTRAVENOUS
  Filled 2013-12-08: qty 8

## 2013-12-08 MED ORDER — SPIRONOLACTONE 12.5 MG HALF TABLET
12.5000 mg | ORAL_TABLET | Freq: Every day | ORAL | Status: DC
  Administered 2013-12-08 – 2013-12-09 (×2): 12.5 mg via ORAL
  Filled 2013-12-08 (×2): qty 1

## 2013-12-08 MED ORDER — ONDANSETRON HCL 4 MG PO TABS: 4.0000 mg | ORAL_TABLET | Freq: Four times a day (QID) | ORAL | Status: DC | PRN

## 2013-12-08 MED ORDER — SODIUM CHLORIDE 0.9 % IJ SOLN
3.0000 mL | Freq: Two times a day (BID) | INTRAMUSCULAR | Status: DC
  Administered 2013-12-08 – 2013-12-09 (×3): 3 mL via INTRAVENOUS

## 2013-12-08 MED ORDER — IPRATROPIUM-ALBUTEROL 0.5-2.5 (3) MG/3ML IN SOLN
3.0000 mL | Freq: Once | RESPIRATORY_TRACT | Status: AC
  Administered 2013-12-08: 3 mL via RESPIRATORY_TRACT
  Filled 2013-12-08: qty 3

## 2013-12-08 MED ORDER — FUROSEMIDE 10 MG/ML IJ SOLN
80.0000 mg | Freq: Two times a day (BID) | INTRAMUSCULAR | Status: DC
  Administered 2013-12-08 – 2013-12-09 (×3): 80 mg via INTRAVENOUS
  Filled 2013-12-08 (×4): qty 8

## 2013-12-08 MED ORDER — ISOSORBIDE MONONITRATE ER 60 MG PO TB24
60.0000 mg | ORAL_TABLET | Freq: Every day | ORAL | Status: DC
  Administered 2013-12-08 – 2013-12-09 (×2): 60 mg via ORAL
  Filled 2013-12-08 (×3): qty 1

## 2013-12-08 MED ORDER — ONDANSETRON HCL 4 MG/2ML IJ SOLN: 4.0000 mg | Freq: Four times a day (QID) | INTRAMUSCULAR | Status: DC | PRN

## 2013-12-08 MED ORDER — ALBUTEROL SULFATE (2.5 MG/3ML) 0.083% IN NEBU
2.5000 mg | INHALATION_SOLUTION | Freq: Four times a day (QID) | RESPIRATORY_TRACT | Status: DC
  Administered 2013-12-08 – 2013-12-09 (×4): 2.5 mg via RESPIRATORY_TRACT
  Filled 2013-12-08 (×4): qty 3

## 2013-12-08 MED ORDER — CARVEDILOL 25 MG PO TABS
25.0000 mg | ORAL_TABLET | Freq: Two times a day (BID) | ORAL | Status: DC
  Administered 2013-12-08 – 2013-12-09 (×4): 25 mg via ORAL
  Filled 2013-12-08 (×7): qty 1

## 2013-12-08 MED ORDER — LOSARTAN POTASSIUM 25 MG PO TABS
25.0000 mg | ORAL_TABLET | Freq: Every day | ORAL | Status: DC
  Administered 2013-12-08 – 2013-12-09 (×2): 25 mg via ORAL
  Filled 2013-12-08 (×2): qty 1

## 2013-12-08 MED ORDER — NITROGLYCERIN 2 % TD OINT
1.0000 [in_us] | TOPICAL_OINTMENT | Freq: Once | TRANSDERMAL | Status: AC
  Administered 2013-12-08: 1 [in_us] via TOPICAL
  Filled 2013-12-08: qty 30

## 2013-12-08 MED ORDER — SODIUM CHLORIDE 0.9 % IJ SOLN
3.0000 mL | Freq: Two times a day (BID) | INTRAMUSCULAR | Status: DC
  Administered 2013-12-08 (×2): 3 mL via INTRAVENOUS

## 2013-12-08 MED ORDER — ALBUTEROL SULFATE (2.5 MG/3ML) 0.083% IN NEBU
2.5000 mg | INHALATION_SOLUTION | RESPIRATORY_TRACT | Status: DC
  Administered 2013-12-08: 2.5 mg via RESPIRATORY_TRACT
  Filled 2013-12-08: qty 3

## 2013-12-08 MED ORDER — ACETAMINOPHEN 650 MG RE SUPP: 650.0000 mg | Freq: Four times a day (QID) | RECTAL | Status: DC | PRN

## 2013-12-08 MED ORDER — POTASSIUM CHLORIDE CRYS ER 20 MEQ PO TBCR
20.0000 meq | EXTENDED_RELEASE_TABLET | Freq: Two times a day (BID) | ORAL | Status: DC
  Administered 2013-12-08 – 2013-12-09 (×4): 20 meq via ORAL
  Filled 2013-12-08 (×5): qty 1

## 2013-12-08 MED ORDER — ENOXAPARIN SODIUM 40 MG/0.4ML ~~LOC~~ SOLN
40.0000 mg | SUBCUTANEOUS | Status: DC
  Administered 2013-12-08: 40 mg via SUBCUTANEOUS
  Filled 2013-12-08 (×2): qty 0.4

## 2013-12-08 MED ORDER — BUDESONIDE 0.25 MG/2ML IN SUSP
0.2500 mg | Freq: Two times a day (BID) | RESPIRATORY_TRACT | Status: DC
  Administered 2013-12-08 – 2013-12-09 (×3): 0.25 mg via RESPIRATORY_TRACT
  Filled 2013-12-08 (×6): qty 2

## 2013-12-08 MED ORDER — ALBUTEROL SULFATE (2.5 MG/3ML) 0.083% IN NEBU: 2.5000 mg | INHALATION_SOLUTION | RESPIRATORY_TRACT | Status: DC | PRN

## 2013-12-08 MED ORDER — FAMOTIDINE 20 MG PO TABS
20.0000 mg | ORAL_TABLET | Freq: Every day | ORAL | Status: DC
Start: 2013-12-08 — End: 2013-12-09
  Administered 2013-12-08 – 2013-12-09 (×2): 20 mg via ORAL
  Filled 2013-12-08 (×2): qty 1

## 2013-12-08 MED ORDER — HYDRALAZINE HCL 50 MG PO TABS
75.0000 mg | ORAL_TABLET | Freq: Three times a day (TID) | ORAL | Status: DC
  Administered 2013-12-08 – 2013-12-09 (×4): 75 mg via ORAL
  Filled 2013-12-08 (×8): qty 1

## 2013-12-08 MED ORDER — LORATADINE 10 MG PO TABS
10.0000 mg | ORAL_TABLET | Freq: Every day | ORAL | Status: DC
Start: 2013-12-08 — End: 2013-12-09
  Administered 2013-12-08 – 2013-12-09 (×2): 10 mg via ORAL
  Filled 2013-12-08 (×2): qty 1

## 2013-12-08 MED ORDER — HYDRALAZINE HCL 20 MG/ML IJ SOLN
10.0000 mg | INTRAMUSCULAR | Status: DC | PRN
  Filled 2013-12-08: qty 0.5

## 2013-12-08 MED ORDER — ACETAMINOPHEN 325 MG PO TABS: 650.0000 mg | ORAL_TABLET | Freq: Four times a day (QID) | ORAL | Status: DC | PRN

## 2013-12-08 NOTE — Progress Notes (Signed)
Pt transported from ED via stretcher. Pt ambulated from stretcher to bed with little assistance. NAD. Pt oriented to room. VSS except BP 183/96. Pt asymptomatic and verbalizes that she did not have time to take her BP medications today. RN to give ordered medications. Will continue to monitor pt. [name]

## 2013-12-08 NOTE — ED Notes (Signed)
Hospitalist at bedside 

## 2013-12-08 NOTE — Progress Notes (Signed)
Patient seen and evaluated earlier this a.m. by my associate. Please refer to his H&P regarding his assessment and plan.  We'll plan on reassessing next a.m.  [place]

## 2013-12-08 NOTE — H&P (Signed)
[facility] History and Physical  KIMMI ACOCELLA GXQ:[id number] DOB: 04/13/1976 DOA: 12/07/2013  Referring physician: ER physician. PCP: Salena Saner., MD  Specialists: Dr. Tempie Hoist.  Chief Complaint: Shortness of breath.  HPI: [name] is a 38 y.o. female with history of postpartum cardiomyopathy with last EF measured was 50% in 2014 and previously in 2007 it was 25%, status post ICD placement, hypertension, bronchial asthma presents to the ER because of increasing shortness of breath. Patient states that she has been gradually having worsening shortness of breath over the last one month. Yesterday patient did not take her antihypertensives because she got busy at her work. By evening patient already short of breath and presented to the ER. Initially patient was given prednisone and nebulizer treatment and since shortness of breath has not improved patient was given Lasix 80 mg IV and admitted for CHF exacerbation. Patient states that she finds it difficult to lie flat because of the shortness of breath. Has some chest tightness but denies any chest pain at this time. Denies any productive cough fever chills abdominal pain nausea vomiting diaphoresis diarrhea headache or focal deficits.   Review of Systems: As presented in the history of presenting illness, rest negative.  Past Medical History  Diagnosis Date  . Hypertension     currently on Labetalol 200mg  BID  . Infection     UTI;not frequent;last infection was in June  . Infection     Yeast;not frequent  . Asthma     Inhaler prn;triggered by seasonal changes mostly cold weathert;last asthma  attack years ago  . CHF (congestive heart failure) 01/2006    Has Pacemaker and Defibrillator;was on heart meds;no longer taking since pregancy  . Anemia     Iron supplements in the past  . Migraines     Can't take Procardia XL, causes migraines  . Varicose veins 1998    Developed during last pregnancy  . Other primary  cardiomyopathies   . Atrial fibrillation   . Paroxysmal ventricular tachycardia   . CHF (congestive heart failure)   . Hypertension   . Acute bronchitis and bronchiolitis    Past Surgical History  Procedure Laterality Date  . Cesarean section  1998    d/t preeclampsia  . Breast reduction surgery  03/05/1994    d/t back, shoulder, and neck pain (42 F)  . Insert / replace / remove pacemaker   02/10/2006    Status post implantable cardioverter-defibrillator insertion  .Marland KitchenMarland KitchenMarland KitchenThe Medtronic Maximo VR, model 7232, single chamber  . Cardiac catheterization      EF 30-35%  . Cardiac defibrillator placement     Social History:  reports that she has never smoked. She has never used smokeless tobacco. She reports that she does not drink alcohol or use illicit drugs. Where does patient live home. Can patient participate in ADLs? Yes.  Allergies  Allergen Reactions  . Percocet [Oxycodone-Acetaminophen] Shortness Of Breath    Family History:  Family History  Problem Relation Age of Onset  . Sickle cell trait Daughter   . Other Father     Varicose veins  . Asthma Daughter   . Seizures Maternal Uncle   . Migraines Mother   . Migraines Daughter   . Migraines Cousin     Maternal  . Cancer Paternal Grandmother     Stomach  . Cancer Maternal Uncle     Prostate  . Cancer Maternal Uncle     Lung  . Ulcers Mother   . Lupus  Maternal Aunt   . Hypertension Father   . Hypertension Paternal Aunt   . Other Daughter     blood transfusion @ birth;platelets were low  . Rheum arthritis Mother   . Diabetes Father     Controlled w/ diet and exercise  . Diabetes Maternal Aunt   . Diabetes Paternal Aunt     x 2  . Diabetes Paternal Uncle   . Hypertension    . Heart disease        Prior to Admission medications   Medication Sig Start Date End Date Taking? Authorizing Provider  albuterol (PROVENTIL HFA;VENTOLIN HFA) 108 (90 BASE) MCG/ACT inhaler Inhale 2 puffs into the lungs every 6 (six) hours  as needed. Rescue inhaler   Yes Historical Provider, MD  carvedilol (COREG) 25 MG tablet Take 1 tablet (25 mg total) by mouth 2 (two) times daily with a meal. 05/03/13  Yes Rande Brunt, NP  cetirizine (ZYRTEC) 10 MG tablet Take 10 mg by mouth daily.   Yes Historical Provider, MD  furosemide (LASIX) 80 MG tablet Take 80 mg by mouth daily.   Yes Historical Provider, MD  hydrALAZINE (APRESOLINE) 50 MG tablet Take 75 mg by mouth 3 (three) times daily.   Yes Historical Provider, MD  isosorbide mononitrate (IMDUR) 60 MG 24 hr tablet Take 60 mg by mouth daily.   Yes Historical Provider, MD  losartan (COZAAR) 25 MG tablet Take 25 mg by mouth daily.   Yes Historical Provider, MD  potassium chloride SA (K-DUR,KLOR-CON) 20 MEQ tablet Take 20 mEq by mouth 2 (two) times daily.   Yes Historical Provider, MD  ranitidine (ZANTAC) 150 MG tablet Take 150 mg by mouth daily.   Yes Historical Provider, MD  spironolactone (ALDACTONE) 25 MG tablet Take 12.5 mg by mouth daily.   Yes Historical Provider, MD    Physical Exam: Filed Vitals:   12/08/13 0100 12/08/13 0115 12/08/13 0130 12/08/13 0207  BP: 159/109 159/98 178/115 182/110  Pulse: 98 99 103 110  Temp:      TempSrc:      Resp: 25 23 21  41  SpO2: 100% 100% 100% 93%     General:  Well-developed well-nourished.  Eyes: Anicteric no pallor.  ENT: No discharge from the ears eyes nose mouth.  Neck: No mass felt. JVD not appreciated.  Cardiovascular: S1-S2 heard tachycardic.  Respiratory: Mild wheezing bilaterally. No crepitations.  Abdomen: Soft nontender bowel sounds present. No guarding or rigidity.  Skin: No rash.  Musculoskeletal: No edema.  Psychiatric: Appears normal.  Neurologic: Alert awake oriented to time place and person. Moves all extremities.  Labs on Admission:  Basic Metabolic Panel:  Recent Labs Lab 12/08/13 0015  NA 142  K 3.7  CL 104  CO2 26  GLUCOSE 102*  BUN 18  CREATININE 1.26*  CALCIUM 8.5  MG 2.1   Liver  Function Tests: No results found for this basename: AST, ALT, ALKPHOS, BILITOT, PROT, ALBUMIN,  in the last 168 hours No results found for this basename: LIPASE, AMYLASE,  in the last 168 hours No results found for this basename: AMMONIA,  in the last 168 hours CBC:  Recent Labs Lab 12/08/13 0015  WBC 5.5  NEUTROABS 3.7  HGB 11.2*  HCT 35.9*  MCV 82.2  PLT 195   Cardiac Enzymes: No results found for this basename: CKTOTAL, CKMB, CKMBINDEX, TROPONINI,  in the last 168 hours  BNP (last 3 results)  Recent Labs  05/03/13 0903 11/20/13 9563 12/08/13 0015  PROBNP 1544.0* 1242.0* 4070.0*   CBG: No results found for this basename: GLUCAP,  in the last 168 hours  Radiological Exams on Admission: Dg Chest 2 View  12/07/2013   CLINICAL DATA:  Shortness of breath  EXAM: CHEST  2 VIEW  COMPARISON:  11/20/2013  FINDINGS: Cardiac shadow is enlarged. A defibrillator is again seen. The lungs are well-aerated without focal infiltrate or sizable effusion. Very mild vascular congestion is noted. The overall appearance is improved from the prior exam however.  IMPRESSION: Cardiomegaly and mild vascular congestion although improved from the prior study. No other focal abnormality is seen.   Electronically Signed   By: Inez Catalina M.D.   On: 12/07/2013 22:39    EKG: Independently reviewed. Sinus tachycardia.  Assessment/Plan Active Problems:   Anemia   Acute on chronic systolic heart failure   CHF (congestive heart failure)   Uncontrolled hypertension   1. Acute on chronic systolic heart failure with history of postpartum cardiomyopathy status post ICD placement - patient has been placed on Lasix 80 mg IV every 12 hourly. And patient's home antihypertensive doses of Coreg hydralazine and Imdur has been given as patient has not taken it. Closely follow intake output and metabolic panel. 2. Accelerated hypertension - probably significant not taking her medications. Home medications has been  restarted. Closely observe blood pressure trends. 3. Mild renal failure - closely follow intake output and metabolic panel. Check UA. 4. Bronchial asthma - patient is mildly wheezing. Continue with nebulizer and Pulmicort. 5. Anemia - follow CBC.    Code Status: Full code.  Family Communication: None.  Disposition Plan: Admit to inpatient.    [facility] Pager [phone].  If 7PM-7AM, please contact night-coverage [email or web address] Password [email or web address] 12/08/2013, 2:25 AM

## 2013-12-08 NOTE — ED Provider Notes (Signed)
Medical screening examination/treatment/procedure(s) were conducted as a shared visit with non-physician practitioner(s) and myself.  I personally evaluated the patient during the encounter.    Teressa Lower, MD 12/08/13 [phone]

## 2013-12-08 NOTE — ED Provider Notes (Signed)
Medical screening examination/treatment/procedure(s) were conducted as a shared visit with non-physician practitioner(s) and myself.  I personally evaluated the patient during the encounter.   Date: 12/08/2013  Rate: 102  Rhythm: sinus tachycardia  QRS Axis: left  Intervals: normal  ST/T Wave abnormalities: nonspecific ST/T changes  Conduction Disutrbances:none  Narrative Interpretation:   Old EKG Reviewed: none available  SOB h/o Af ib and CHF. On exam some wheezes present, no resp distress evaluated with CXR, ECG and labs. Plan MED admit.   Teressa Lower, MD 12/08/13 [phone]

## 2013-12-08 NOTE — ED Provider Notes (Signed)
CSN: [id number]     Arrival date & time 12/07/13  2209 History   First MD Initiated Contact with Patient 12/07/13 2238     Chief Complaint  Patient presents with  . Shortness of Breath  . Asthma     (Consider location/radiation/quality/duration/timing/severity/associated sxs/prior Treatment) HPI  [name] is a 38 y.o. female  with past medical history significant for hypertension, A. Fib (not anticoagulated), CHF, implanted defibrillator complaining of difficulty breathing onset this afternoon. Patient states that she believes it is her asthma, states that she works at [employer] and neck the prior was putting off smoke and there was poor ventilation. She denies increasing peripheral edema, soft and, paroxysmal nocturnal dyspnea, cough, fever, chest pain, recent long trips or immobilization, leg swelling, history of DVT or PE. Patient's been taking her medications as directed. Patient states she has never been intubated or hospitalized for asthma.  Cardiology: [facility]  Past Medical History  Diagnosis Date  . Hypertension     currently on Labetalol 200mg  BID  . Infection     UTI;not frequent;last infection was in June  . Infection     Yeast;not frequent  . Asthma     Inhaler prn;triggered by seasonal changes mostly cold weathert;last asthma  attack years ago  . CHF (congestive heart failure) 01/2006    Has Pacemaker and Defibrillator;was on heart meds;no longer taking since pregancy  . Anemia     Iron supplements in the past  . Migraines     Can't take Procardia XL, causes migraines  . Varicose veins 1998    Developed during last pregnancy  . Other primary cardiomyopathies   . Atrial fibrillation   . Paroxysmal ventricular tachycardia   . CHF (congestive heart failure)   . Hypertension   . Acute bronchitis and bronchiolitis    Past Surgical History  Procedure Laterality Date  . Cesarean section  1998    d/t preeclampsia  . Breast reduction surgery  03/05/1994    d/t  back, shoulder, and neck pain (42 F)  . Insert / replace / remove pacemaker   02/10/2006    Status post implantable cardioverter-defibrillator insertion  .Marland KitchenMarland KitchenMarland KitchenThe Medtronic Maximo VR, model 7232, single chamber  . Cardiac catheterization      EF 30-35%  . Cardiac defibrillator placement     Family History  Problem Relation Age of Onset  . Sickle cell trait Daughter   . Other Father     Varicose veins  . Asthma Daughter   . Seizures Maternal Uncle   . Migraines Mother   . Migraines Daughter   . Migraines Cousin     Maternal  . Cancer Paternal Grandmother     Stomach  . Cancer Maternal Uncle     Prostate  . Cancer Maternal Uncle     Lung  . Ulcers Mother   . Lupus Maternal Aunt   . Hypertension Father   . Hypertension Paternal Aunt   . Other Daughter     blood transfusion @ birth;platelets were low  . Rheum arthritis Mother   . Diabetes Father     Controlled w/ diet and exercise  . Diabetes Maternal Aunt   . Diabetes Paternal Aunt     x 2  . Diabetes Paternal Uncle   . Hypertension    . Heart disease     History  Substance Use Topics  . Smoking status: Never Smoker   . Smokeless tobacco: Never Used  . Alcohol Use: No   OB  History   Grav Para Term Preterm Abortions TAB SAB Ect Mult Living   2 1  1      1      Review of Systems  10 systems reviewed and found to be negative, except as noted in the HPI.  Allergies  Percocet  Home Medications   Prior to Admission medications   Medication Sig Start Date End Date Taking? Authorizing Provider  albuterol (PROVENTIL HFA;VENTOLIN HFA) 108 (90 BASE) MCG/ACT inhaler Inhale 2 puffs into the lungs every 6 (six) hours as needed. Rescue inhaler   Yes Historical Provider, MD  carvedilol (COREG) 25 MG tablet Take 1 tablet (25 mg total) by mouth 2 (two) times daily with a meal. 05/03/13  Yes Rande Brunt, NP  cetirizine (ZYRTEC) 10 MG tablet Take 10 mg by mouth daily.   Yes Historical Provider, MD  furosemide (LASIX) 80 MG  tablet Take 80 mg by mouth daily.   Yes Historical Provider, MD  hydrALAZINE (APRESOLINE) 50 MG tablet Take 75 mg by mouth 3 (three) times daily.   Yes Historical Provider, MD  isosorbide mononitrate (IMDUR) 60 MG 24 hr tablet Take 60 mg by mouth daily.   Yes Historical Provider, MD  losartan (COZAAR) 25 MG tablet Take 25 mg by mouth daily.   Yes Historical Provider, MD  potassium chloride SA (K-DUR,KLOR-CON) 20 MEQ tablet Take 20 mEq by mouth 2 (two) times daily.   Yes Historical Provider, MD  ranitidine (ZANTAC) 150 MG tablet Take 150 mg by mouth daily.   Yes Historical Provider, MD  spironolactone (ALDACTONE) 25 MG tablet Take 12.5 mg by mouth daily.   Yes Historical Provider, MD   BP 161/105  Pulse 101  Temp(Src) 98.5 F (36.9 C) (Oral)  Resp 44  SpO2 93%  LMP 12/05/2013 Physical Exam  Nursing note and vitals reviewed. Constitutional: She is oriented to person, place, and time. She appears well-developed and well-nourished. No distress.  HENT:  Head: Normocephalic and atraumatic.  Mouth/Throat: Oropharynx is clear and moist.  Eyes: Conjunctivae and EOM are normal. Pupils are equal, round, and reactive to light.  Neck: Normal range of motion. Neck supple.  Cardiovascular: Normal rate.   Pulmonary/Chest: No stridor. No respiratory distress. She has wheezes. She has no rales. She exhibits no tenderness.  Tachypnea, speaking in full sentences, no stridor, controlling her secretions.  Patient with diffuse scattered expiratory wheezing moderate to severe. Breath sounds decreased at the bilateral bases, no rhonchi.  Abdominal: Soft. Bowel sounds are normal. She exhibits no distension and no mass. There is no tenderness. There is no rebound and no guarding.  Musculoskeletal: Normal range of motion.  Trace and equal bilateral pitting edema. No calf tenderness to palpation or calf asymmetry.  Neurological: She is alert and oriented to person, place, and time.  Psychiatric: She has a normal  mood and affect.    ED Course  Procedures (including critical care time) Labs Review Labs Reviewed  CBC WITH DIFFERENTIAL  PRO B NATRIURETIC PEPTIDE  BASIC METABOLIC PANEL  Randolm Idol, ED    Imaging Review Dg Chest 2 View  12/07/2013   CLINICAL DATA:  Shortness of breath  EXAM: CHEST  2 VIEW  COMPARISON:  11/20/2013  FINDINGS: Cardiac shadow is enlarged. A defibrillator is again seen. The lungs are well-aerated without focal infiltrate or sizable effusion. Very mild vascular congestion is noted. The overall appearance is improved from the prior exam however.  IMPRESSION: Cardiomegaly and mild vascular congestion although improved from the prior  study. No other focal abnormality is seen.   Electronically Signed   By: Inez Catalina M.D.   On: 12/07/2013 22:39     EKG Interpretation None     EKG  the end of the abdomen and wewith sinus tachycardia, LVH with strain pattern, normal intervals, axis leftward. No nonspecific ST changes.  MDM   Final diagnoses:  None    Filed Vitals:   12/07/13 2212 12/07/13 2249 12/07/13 2335 12/08/13 0012  BP: 163/105  161/105   Pulse: 116  101   Temp: 98.9 F (37.2 C)  98.5 F (36.9 C)   TempSrc: Oral  Oral   Resp: 24  44   SpO2: 91% 98% 93% 93%    Medications  ipratropium-albuterol (DUONEB) 0.5-2.5 (3) MG/3ML nebulizer solution 3 mL (not administered)  ipratropium-albuterol (DUONEB) 0.5-2.5 (3) MG/3ML nebulizer solution 3 mL (3 mLs Nebulization Given 12/07/13 2249)  predniSONE (DELTASONE) tablet 60 mg (60 mg Oral Given 12/07/13 2317)  albuterol (PROVENTIL,VENTOLIN) solution continuous neb (5 mg/hr Nebulization Given 12/08/13 0011)    [name] is a 38 y.o. female presenting with wheezing and shortness of breath onset this afternoon. The patient is tachypneic and tachycardic, no respiratory distress. No chest pain. Differential includes CHF exacerbation versus asthma exacerbation. Lesser concern ACS and PE. Patient started on  prednisone and nebulizer with little relief. EKG  the end of the abdomen and wewith sinus tachycardia, LVH with strain pattern, normal intervals, axis leftward. Chest x-ray shows cardiomegaly with vascular congestion improved from prior study. Cardiac workup with BNP ordered. Case signed out to NP Delena Bali at shift change.  Note: Portions of this report may have been transcribed using voice recognition software. Every effort was made to ensure accuracy; however, inadvertent computerized transcription errors may be present     Monico Blitz, PA-C 12/08/13 0041

## 2013-12-08 NOTE — ED Notes (Signed)
Patient assisted with ambulation to restroom. Patient was given supplies and instructed on how to obtain a clean catch UA.

## 2013-12-08 NOTE — ED Notes (Signed)
Spoke to Dr Hal Hope, advised EKG was done at 2353 12/07/13, he is aware that EKG is not crossing into EPIC and states to be sure it goes to the floor with the patient.

## 2013-12-08 NOTE — Care Management Note (Signed)
    Page 1 of 1   12/08/2013     2:29:11 PM CARE MANAGEMENT NOTE 12/08/2013  Patient:  [name], [name]   Account Number:  [id number]  Date Initiated:  12/08/2013  Documentation initiated by:  Dessa Phi  Subjective/Objective Assessment:   38 Y/O F ADMITTED W/CHF.AJ:[id number],RCB.     Action/Plan:   FROM HOME W/SPOUSE.HAS PCP,PHARMACY.   Anticipated DC Date:  12/11/2013   Anticipated DC Plan:  [place]  CM consult      Choice offered to / List presented to:             Status of service:  In process, will continue to follow Medicare Important Message given?   (If response is "NO", the following Medicare IM given date fields will be blank) Date Medicare IM given:   Date Additional Medicare IM given:    Discharge Disposition:    Per UR Regulation:  Reviewed for med. necessity/level of care/duration of stay  If discussed at Long Length of Stay Meetings, dates discussed:    Comments:  12/08/13 Quintell Bonnin RN,BSN NCM [phone] NO ANTICIPATED D/C NEEDS.

## 2013-12-08 NOTE — ED Provider Notes (Signed)
Patient to be admitted for CHF excerebration  have started IV Lasix and topical Nitro due to BNP over 4000, HTN and tasychardia  Garald Balding, NP 12/08/13 [phone]

## 2013-12-08 NOTE — Progress Notes (Signed)
Care of pt assumed at this time.  Pt assessed, agree with previously charted assessment.  Pt denies questions or concerns at this time, states she spoke with Dr. Wendee Beavers on the bedside phone a few moments ago.  Garry Heater, RN 12/08/2013

## 2013-12-09 MED ORDER — SENNA 8.6 MG PO TABS
1.0000 | ORAL_TABLET | Freq: Once | ORAL | Status: DC
  Filled 2013-12-09: qty 1

## 2013-12-09 MED ORDER — POTASSIUM CHLORIDE CRYS ER 20 MEQ PO TBCR
40.0000 meq | EXTENDED_RELEASE_TABLET | Freq: Once | ORAL | Status: AC
  Administered 2013-12-09: 40 meq via ORAL
  Filled 2013-12-09: qty 2

## 2013-12-09 NOTE — Discharge Summary (Addendum)
Physician Discharge Summary  [name] LPF:[id number] DOB: 07/22/1976 DOA: 12/07/2013  PCP: Salena Saner., MD  Admit date: 12/07/2013 Discharge date: 12/09/2013  Time spent: > 35 minutes  Recommendations for Outpatient Follow-up:  1. Continue to monitor patient's blood pressure and adjust her antihypertensive medication as needed.  Discharge Diagnoses:  Active Problems:   Anemia   Acute on chronic systolic heart failure   CHF (congestive heart failure)   Uncontrolled hypertension   Discharge Condition: stable  Diet recommendation: heart healthy  Filed Weights   12/08/13 0309 12/09/13 0630  Weight: 105.144 kg (231 lb 12.8 oz) 102.649 kg (226 lb 4.8 oz)    History of present illness:  [name] is a 38 y.o. female with history of postpartum cardiomyopathy with last EF measured was 50% in 2014 and previously in 2007 it was 25%, status post ICD placement, hypertension, bronchial asthma presents to the ER because of increasing shortness of breath   Hospital Course:  Active Problems:   Acute on chronic systolic heart failure - Pt is s/p ICD placement and on carvedilol - continue home regimen spironolactone, Hydralazine, ARB - replace K levels with extra dose of K dur prior to discharging - Recommended f/u with her cardiologist after discharging - Currently compensated and breathing comfortably on RA on day of d/c    Uncontrolled hypertension - Most likely cause of Principle problem mentioned above. - Blood pressures relatively well controlled on her current home antihypertensive regimen. - Patient day prior to presentation to the hospital did not take her antihypertensive agents.    Anemia - resolved  Addendum: Abdominal discomfort - in context of patient who is currently on her menstrual cycle with no fevers, elevated WBC count and VSS. Differential includes increased stool burden vs menstrual cycle related discomfort.  Will provide senna prior to d/c. Will  avoid NSAIDs given history of CHF and HTN.   Procedures:  None  Consultations:  None  Discharge Exam: Filed Vitals:   12/09/13 0625  BP: 143/94  Pulse: 73  Temp:   Resp: 14    General: Pt in NAD, alert and awake Cardiovascular: RRR, no rubs Respiratory: CTA BL, no wheezes, no increased WOB on room air. Abdomen: obese, mild tenderness on deep palpation over RLQ, no rebound tenderness  Discharge Instructions You were cared for by a hospitalist during your hospital stay. If you have any questions about your discharge medications or the care you received while you were in the hospital after you are discharged, you can call the unit and asked to speak with the hospitalist on call if the hospitalist that took care of you is not available. Once you are discharged, your primary care physician will handle any further medical issues. Please note that NO REFILLS for any discharge medications will be authorized once you are discharged, as it is imperative that you return to your primary care physician (or establish a relationship with a primary care physician if you do not have one) for your aftercare needs so that they can reassess your need for medications and monitor your lab values.  Discharge Instructions   Call MD for:  difficulty breathing, headache or visual disturbances    Complete by:  As directed      Call MD for:  persistant nausea and vomiting    Complete by:  As directed      Call MD for:  severe uncontrolled pain    Complete by:  As directed      Call  MD for:  temperature >100.4    Complete by:  As directed      Diet - low sodium heart healthy    Complete by:  As directed      Discharge instructions    Complete by:  As directed   Please be sure to follow up with your cardiologist in 1-2 weeks or sooner should any new concerns arise.     Increase activity slowly    Complete by:  As directed             Medication List         albuterol 108 (90 BASE) MCG/ACT  inhaler  Commonly known as:  PROVENTIL HFA;VENTOLIN HFA  Inhale 2 puffs into the lungs every 6 (six) hours as needed. Rescue inhaler     carvedilol 25 MG tablet  Commonly known as:  COREG  Take 1 tablet (25 mg total) by mouth 2 (two) times daily with a meal.     cetirizine 10 MG tablet  Commonly known as:  ZYRTEC  Take 10 mg by mouth daily.     furosemide 80 MG tablet  Commonly known as:  LASIX  Take 80 mg by mouth daily.     hydrALAZINE 50 MG tablet  Commonly known as:  APRESOLINE  Take 75 mg by mouth 3 (three) times daily.     isosorbide mononitrate 60 MG 24 hr tablet  Commonly known as:  IMDUR  Take 60 mg by mouth daily.     losartan 25 MG tablet  Commonly known as:  COZAAR  Take 25 mg by mouth daily.     potassium chloride SA 20 MEQ tablet  Commonly known as:  K-DUR,KLOR-CON  Take 20 mEq by mouth 2 (two) times daily.     ranitidine 150 MG tablet  Commonly known as:  ZANTAC  Take 150 mg by mouth daily.     spironolactone 25 MG tablet  Commonly known as:  ALDACTONE  Take 12.5 mg by mouth daily.       Allergies  Allergen Reactions  . Percocet [Oxycodone-Acetaminophen] Shortness Of Breath      The results of significant diagnostics from this hospitalization (including imaging, microbiology, ancillary and laboratory) are listed below for reference.    Significant Diagnostic Studies: Dg Chest 2 View  12/07/2013   CLINICAL DATA:  Shortness of breath  EXAM: CHEST  2 VIEW  COMPARISON:  11/20/2013  FINDINGS: Cardiac shadow is enlarged. A defibrillator is again seen. The lungs are well-aerated without focal infiltrate or sizable effusion. Very mild vascular congestion is noted. The overall appearance is improved from the prior exam however.  IMPRESSION: Cardiomegaly and mild vascular congestion although improved from the prior study. No other focal abnormality is seen.   Electronically Signed   By: Inez Catalina M.D.   On: 12/07/2013 22:39   Dg Chest Portable 1  View  11/20/2013   CLINICAL DATA:  Shortness of breath, worsening cough for 2 weeks, history asthma, hypertension, CHF, atrial fibrillation, bronchitis  EXAM: PORTABLE CHEST - 1 VIEW  COMPARISON:  Portable exam 0920 hr compared to 12/12/2012  FINDINGS: LEFT subclavian AICD lead tip projects over RIGHT ventricle.  Enlargement of cardiac silhouette.  Mediastinal contours and pulmonary vascularity normal.  Improved pulmonary edema versus previous exam.  No definite acute infiltrate, pleural effusion or pneumothorax.  Bones unremarkable.  IMPRESSION: Enlargement of cardiac silhouette.  Resolution of pulmonary edema versus previous exam.  No acute abnormalities.   Electronically Signed  By: Lavonia Dana M.D.   On: 11/20/2013 09:31   Dg Abd 2 Views  12/08/2013   CLINICAL DATA:  Abdominal pain  EXAM: ABDOMEN - 2 VIEW  COMPARISON:  Ultrasound 09/28/2013 and earlier studies  FINDINGS: Visualized lung bases clear.  AICD lead partially visualized.  No free air.  Normal bowel gas pattern.  No abnormal abdominal calcifications.  Regional bones unremarkable.  IMPRESSION: Negative.   Electronically Signed   By: Arne Cleveland M.D.   On: 12/08/2013 16:56    Microbiology: No results found for this or any previous visit (from the past 240 hour(s)).   Labs: Basic Metabolic Panel:  Recent Labs Lab 12/08/13 0015 12/08/13 0418  NA 142 143  K 3.7 3.6*  CL 104 104  CO2 26 25  GLUCOSE 102* 175*  BUN 18 17  CREATININE 1.26* 1.24*  CALCIUM 8.5 8.7  MG 2.1  --    Liver Function Tests:  Recent Labs Lab 12/08/13 0418  AST 23  ALT 22  ALKPHOS 60  BILITOT 1.0  PROT 7.1  ALBUMIN 3.5   No results found for this basename: LIPASE, AMYLASE,  in the last 168 hours No results found for this basename: AMMONIA,  in the last 168 hours CBC:  Recent Labs Lab 12/08/13 0015 12/08/13 0418  WBC 5.5 6.7  NEUTROABS 3.7 6.2  HGB 11.2* 12.1  HCT 35.9* 38.2  MCV 82.2 82.5  PLT 195 216   Cardiac Enzymes:  Recent  Labs Lab 12/08/13 0418  TROPONINI <0.30   BNP: BNP (last 3 results)  Recent Labs  05/03/13 0903 11/20/13 0928 12/08/13 0015  PROBNP 1544.0* 1242.0* 4070.0*   CBG: No results found for this basename: GLUCAP,  in the last 168 hours     Signed:  Velvet Bathe  [facility] 12/09/2013, 10:27 AM

## 2013-12-09 NOTE — Progress Notes (Signed)
10 beat run of vtach, MD in room with pt, she was asymptomatic.

## 2014-01-02 ENCOUNTER — Other Ambulatory Visit (HOSPITAL_COMMUNITY): Payer: Self-pay | Admitting: *Deleted

## 2014-01-02 ENCOUNTER — Other Ambulatory Visit (HOSPITAL_COMMUNITY): Payer: Self-pay | Admitting: Internal Medicine

## 2014-01-02 MED ORDER — FUROSEMIDE 80 MG PO TABS: 80.0000 mg | ORAL_TABLET | Freq: Every day | ORAL | Status: DC

## 2014-01-03 ENCOUNTER — Ambulatory Visit (HOSPITAL_COMMUNITY)
Admission: RE | Admit: 2014-01-03 | Discharge: 2014-01-03 | Disposition: A | Discharge status: Home / Self Care | Payer: 59 | Source: Ambulatory Visit | Attending: Internal Medicine | Admitting: Internal Medicine

## 2014-01-03 VITALS — BP 146/90 | HR 84 | Wt 231.5 lb

## 2014-01-03 DIAGNOSIS — I2589 Other forms of chronic ischemic heart disease: Secondary | ICD-10-CM | POA: Insufficient documentation

## 2014-01-03 DIAGNOSIS — I509 Heart failure, unspecified: Secondary | ICD-10-CM

## 2014-01-03 DIAGNOSIS — I428 Other cardiomyopathies: Secondary | ICD-10-CM | POA: Insufficient documentation

## 2014-01-03 DIAGNOSIS — I110 Hypertensive heart disease with heart failure: Secondary | ICD-10-CM | POA: Insufficient documentation

## 2014-01-03 DIAGNOSIS — I10 Essential (primary) hypertension: Secondary | ICD-10-CM

## 2014-01-03 DIAGNOSIS — I472 Ventricular tachycardia, unspecified: Secondary | ICD-10-CM | POA: Insufficient documentation

## 2014-01-03 DIAGNOSIS — J069 Acute upper respiratory infection, unspecified: Secondary | ICD-10-CM

## 2014-01-03 DIAGNOSIS — Z95810 Presence of automatic (implantable) cardiac defibrillator: Secondary | ICD-10-CM | POA: Insufficient documentation

## 2014-01-03 DIAGNOSIS — I5022 Chronic systolic (congestive) heart failure: Secondary | ICD-10-CM

## 2014-01-03 DIAGNOSIS — E669 Obesity, unspecified: Secondary | ICD-10-CM | POA: Insufficient documentation

## 2014-01-03 DIAGNOSIS — I4729 Other ventricular tachycardia: Secondary | ICD-10-CM | POA: Insufficient documentation

## 2014-01-03 DIAGNOSIS — Z9861 Coronary angioplasty status: Secondary | ICD-10-CM | POA: Insufficient documentation

## 2014-01-03 LAB — BASIC METABOLIC PANEL
BUN: 17 mg/dL (ref 6–23)
CALCIUM: 8.8 mg/dL (ref 8.4–10.5)
CO2: 26 mEq/L (ref 19–32)
CREATININE: 1.27 mg/dL — AB (ref 0.50–1.10)
Chloride: 102 mEq/L (ref 96–112)
GFR calc Af Amer: 61 mL/min — ABNORMAL LOW (ref >90)
GFR, EST NON AFRICAN AMERICAN: 52 mL/min — AB (ref >90)
Glucose, Bld: 93 mg/dL (ref 70–99)
Potassium: 4.2 mEq/L (ref 3.7–5.3)
Sodium: 142 mEq/L (ref 137–147)

## 2014-01-03 LAB — PRO B NATRIURETIC PEPTIDE: PRO B NATRI PEPTIDE: 1766 pg/mL — AB (ref 0–125)

## 2014-01-03 MED ORDER — HYDRALAZINE HCL 50 MG PO TABS: 75.0000 mg | ORAL_TABLET | Freq: Three times a day (TID) | ORAL | Status: DC

## 2014-01-03 MED ORDER — PREDNISONE (PAK) 10 MG PO TABS
ORAL_TABLET | ORAL | Status: DC
Start: 2014-01-03 — End: 2014-02-27

## 2014-01-03 MED ORDER — DOXYCYCLINE HYCLATE 50 MG PO CAPS: 100.0000 mg | ORAL_CAPSULE | Freq: Two times a day (BID) | ORAL | Status: DC

## 2014-01-03 MED ORDER — ISOSORBIDE MONONITRATE ER 60 MG PO TB24: 60.0000 mg | ORAL_TABLET | Freq: Every day | ORAL | Status: DC

## 2014-01-03 MED ORDER — LOSARTAN POTASSIUM 25 MG PO TABS: 25.0000 mg | ORAL_TABLET | Freq: Every day | ORAL | Status: DC

## 2014-01-03 MED ORDER — SPIRONOLACTONE 25 MG PO TABS: 12.5000 mg | ORAL_TABLET | Freq: Every day | ORAL | Status: DC

## 2014-01-03 MED ORDER — FUROSEMIDE 80 MG PO TABS: 80.0000 mg | ORAL_TABLET | Freq: Every day | ORAL | Status: DC

## 2014-01-03 MED ORDER — POTASSIUM CHLORIDE CRYS ER 20 MEQ PO TBCR: 20.0000 meq | EXTENDED_RELEASE_TABLET | Freq: Two times a day (BID) | ORAL | Status: DC

## 2014-01-03 MED ORDER — CARVEDILOL 25 MG PO TABS: 25.0000 mg | ORAL_TABLET | Freq: Two times a day (BID) | ORAL | Status: DC

## 2014-01-03 NOTE — Progress Notes (Signed)
Patient ID: [name], female   DOB: Dec 16, 1975, 38 y.o.   MRN: [id number] PCP: Dr. Heath [name]  OB:  Dr. Leo [name]  EP: Dr. Lovena [name]  HPI: [name] is a 38 year old woman with h/o obesity, chronic systolic heart failure due to nonischemic cardiomyopathy (previously recovered, but now 35%, 11/2012) s/p ICD implantation and severe hypertension.    Cardiac Cath 2007 -Findings consistent with nonischemic, mildly dilated cardiomyopathy. This is probably related to hypertension with hypertensive heart disease. Severe left ventricular systolic dysfunction, ejection fraction around 35% with moderate to marked global hypokinesis. Moderate pulmonary hypertension. Preserved cardiac output and cardiac index.  ECHOs 04/27/2011 45-50%.   08/20/11 ECHO 55-60%   05/19/12 EF ~40%   07/01/12 EF ~40%   08/02/12 EF~40%    2/14 EF ~50%                          5/14 EF ~35%                          She delievered her baby on September 25, 2012 without complication. Admitted 5/18 -5/21 with hypertensive crisis and decompensated systolic heart failure. Treated with BIPAP and IV lasix. Discharge weight 222lbs. Echo showed EF down from 50->35%. Cr bumped to 1.55 but was 1.18 at d/c.  Follow up for Heart Failure: Last visit increased coreg to 25 mg BID which she is taking, however she reports that it makes her fatigued.  She was admitted to the hospital in May for SOB and was diuresed 6 lbs. +SOB, DOE and orthopnea. +wet cough for 2 months. Weight at home 231 lbs. Following a low salt diet and drinking less than 2L a day.  SH: Married. Lives in [place], 2 children. Was working FT at [employer] but transferring [employer]. No ETOH or smoking  FH: Mother living: DM2, HTN, no CAD        Father living: DM2, HTN  ROS: All systems negative except as listed in HPI, PMH and Problem List.  Past Medical History  Diagnosis Date  . Hypertension     currently on Labetalol 200mg  BID  . Infection     UTI;not frequent;last infection  was in June  . Infection     Yeast;not frequent  . Asthma     Inhaler prn;triggered by seasonal changes mostly cold weathert;last asthma  attack years ago  . CHF (congestive heart failure) 01/2006    Has Pacemaker and Defibrillator;was on heart meds;no longer taking since pregancy  . Anemia     Iron supplements in the past  . Migraines     Can't take Procardia XL, causes migraines  . Varicose veins 1998    Developed during last pregnancy  . Other primary cardiomyopathies   . Atrial fibrillation   . Paroxysmal ventricular tachycardia   . CHF (congestive heart failure)   . Hypertension   . Acute bronchitis and bronchiolitis     Current Outpatient Prescriptions  Medication Sig Dispense Refill  . albuterol (PROVENTIL HFA;VENTOLIN HFA) 108 (90 BASE) MCG/ACT inhaler Inhale 2 puffs into the lungs every 6 (six) hours as needed. Rescue inhaler      . carvedilol (COREG) 25 MG tablet Take 1 tablet (25 mg total) by mouth 2 (two) times daily with a meal.  60 tablet  6  . cetirizine (ZYRTEC) 10 MG tablet Take 10 mg by mouth daily.      . furosemide (LASIX) 80 MG  tablet Take 1 tablet (80 mg total) by mouth daily.  30 tablet  2  . hydrALAZINE (APRESOLINE) 50 MG tablet Take 75 mg by mouth 3 (three) times daily.      . isosorbide mononitrate (IMDUR) 60 MG 24 hr tablet Take 60 mg by mouth daily.      Marland Kitchen losartan (COZAAR) 25 MG tablet Take 25 mg by mouth daily.      . potassium chloride SA (K-DUR,KLOR-CON) 20 MEQ tablet Take 20 mEq by mouth 2 (two) times daily.      . ranitidine (ZANTAC) 150 MG tablet Take 150 mg by mouth daily.      Marland Kitchen spironolactone (ALDACTONE) 25 MG tablet Take 12.5 mg by mouth daily.       No current facility-administered medications for this encounter.    Filed Vitals:   01/03/14 1522  BP: 146/90  Pulse: 84  Weight: 231 lb 8 oz (105.008 kg)  SpO2: 95%    PHYSICAL EXAM: General:  Congested.Weak apeparing No resp difficulty HEENT: normal Neck: supple. JVP 7; Carotids 2+  bilaterally; no bruits. No lymphadenopathy or thryomegaly appreciated. Cor: PMI normal. Regular rate & rhythm. +s4. No murmurs Lungs: mild rhonchi Abdomen: soft, nontender, mildly distended and obese. No hepatosplenomegaly. No bruits or masses. Good bowel sounds. Extremities: no cyanosis, clubbing, rash, 1+ edema Neuro: alert & orientedx3, cranial nerves grossly intact. Moves all 4 extremities w/o difficulty. Affect pleasant.  ASSESSMENT & PLAN:  1) Chronic systolic heart failure, NICM likely r/t HTN, EF 35% (11/2012), s/p ICD - NYHA II-III symptoms. Volume status elevated - Will give extra lasix for 3 days. You can take 80 mg (1 tablet) in the morning and 80 mg (1 tablet) in the evening. Take an extra potassium for 3 days.  - will need repeat echo at next visit - Reinforced the need and importance of daily weights, a low sodium diet, and fluid restriction (less than 2 L a day). Instructed to call the HF clinic if weight increases more than 3 lbs overnight or 5 lbs in a week. 2) URI  - I think majority of symptoms are due to URI. Treat with doxycycline and prednisone. Given frequent flares will refer to pulmonary  2) HTN - improved control but still upper end of normal. Hopefully will improve with diuresis. Can increase Losartan at next visit as needed.   F/U 2-3 weeks  [name] B NP-C 3:34 PM  Patient seen and examined with Briana Bame, NP. We discussed all aspects of the encounter. I agree with the assessment and plan as stated above. I have edited the note to reflect my changes.   Briana Sangiovanni,MD 4:12 PM

## 2014-01-03 NOTE — Patient Instructions (Signed)
Take an extra lasix for 3 days. You can take 80 mg (1 tablet) in the morning and 80 mg (1 tablet) in the evening. Take an extra potassium for 3 days.   Take doxycycline 100 mg (1 tablet) twice a day for a week.  Take prednisone 40 mg (4 tablets) for 3 days and then 30 mg (3 tablets) for 3 days and then 20 mg (2 tablets) for 2 days and then 10 mg (1 tablet) for 2 days.   Refer to pulmonary  Follow up in 3 weeks with ECHO  Do the following things EVERYDAY: 1) Weigh yourself in the morning before breakfast. Write it down and keep it in a log. 2) Take your medicines as prescribed 3) Eat low salt foods-Limit salt (sodium) to 2000 mg per day.  4) Stay as active as you can everyday 5) Limit all fluids for the day to less than 2 liters 6)

## 2014-01-15 DIAGNOSIS — J069 Acute upper respiratory infection, unspecified: Secondary | ICD-10-CM | POA: Insufficient documentation

## 2014-01-15 DIAGNOSIS — B9789 Other viral agents as the cause of diseases classified elsewhere: Secondary | ICD-10-CM

## 2014-01-24 ENCOUNTER — Institutional Professional Consult (permissible substitution): Payer: 59 | Admitting: Critical Care Medicine

## 2014-01-25 ENCOUNTER — Ambulatory Visit (HOSPITAL_COMMUNITY)
Admission: RE | Admit: 2014-01-25 | Discharge: 2014-01-25 | Disposition: A | Discharge status: Home / Self Care | Payer: 59 | Source: Ambulatory Visit | Attending: Internal Medicine | Admitting: Internal Medicine

## 2014-01-25 ENCOUNTER — Ambulatory Visit (HOSPITAL_BASED_OUTPATIENT_CLINIC_OR_DEPARTMENT_OTHER)
Admission: RE | Admit: 2014-01-25 | Discharge: 2014-01-25 | Disposition: A | Discharge status: Home / Self Care | Payer: 59 | Source: Ambulatory Visit | Attending: Internal Medicine | Admitting: Internal Medicine

## 2014-01-25 ENCOUNTER — Encounter (HOSPITAL_COMMUNITY): Payer: Self-pay

## 2014-01-25 VITALS — BP 132/88 | HR 84 | Wt 231.4 lb

## 2014-01-25 DIAGNOSIS — J410 Simple chronic bronchitis: Secondary | ICD-10-CM | POA: Insufficient documentation

## 2014-01-25 DIAGNOSIS — J42 Unspecified chronic bronchitis: Secondary | ICD-10-CM | POA: Insufficient documentation

## 2014-01-25 DIAGNOSIS — I509 Heart failure, unspecified: Secondary | ICD-10-CM

## 2014-01-25 DIAGNOSIS — G4733 Obstructive sleep apnea (adult) (pediatric): Secondary | ICD-10-CM | POA: Insufficient documentation

## 2014-01-25 DIAGNOSIS — I5022 Chronic systolic (congestive) heart failure: Secondary | ICD-10-CM | POA: Diagnosis not present

## 2014-01-25 MED ORDER — TORSEMIDE 20 MG PO TABS: 60.0000 mg | ORAL_TABLET | Freq: Every day | ORAL | Status: DC

## 2014-01-25 NOTE — Patient Instructions (Signed)
Stop Furosemide  Start Torsemide 60 mg (3 tabs) daily  Labs in 1 week  Your physician has recommended that you have a cardiopulmonary stress test (CPX). CPX testing is a non-invasive measurement of heart and lung function. It replaces a traditional treadmill stress test. This type of test provides a tremendous amount of information that relates not only to your present condition but also for future outcomes. This test combines measurements of you ventilation, respiratory gas exchange in the lungs, electrocardiogram (EKG), blood pressure and physical response before, during, and following an exercise protocol.  Your physician recommends that you schedule a follow-up appointment in: 1 month

## 2014-01-25 NOTE — Progress Notes (Addendum)
Patient ID: [name], female   DOB: [birth year], 38 y.o.   MRN: [id number]  PCP: Dr. Heath Gold  OB:  Dr. Leo Grosser  EP: Dr. Lovena Le  HPI: [name] is a 38 year old woman with h/o obesity, chronic systolic heart failure due to nonischemic cardiomyopathy (previously recovered, but now 35%, 11/2012) s/p ICD implantation, asthma/chronic bronchitis and severe hypertension.    Cardiac Cath 2007 -Findings consistent with nonischemic, mildly dilated cardiomyopathy. This is probably related to hypertension with hypertensive heart disease. Severe left ventricular systolic dysfunction, ejection fraction around 35% with moderate to marked global hypokinesis. Moderate pulmonary hypertension. Preserved cardiac output and cardiac index.  ECHOs 04/27/2011 45-50%.   08/20/11 ECHO 55-60%   05/19/12 EF ~40%   07/01/12 EF ~40%   08/02/12 EF~40%    2/14 EF ~50%                          5/14 EF ~35%                          She delievered her baby on September 25, 2012 without complication. Admitted 5/18 -12/14/12 with hypertensive crisis and decompensated systolic heart failure. Treated with BIPAP and IV lasix. Discharge weight 222lbs. Echo showed EF down from 50->35%. Cr bumped to 1.55 but was 1.18 at d/c.  Admitted 5/15 with ADHF and diuresesd 6 pounds down to 231  Follow up for Heart Failure: We saw her a few weeks ago and was markedly SOB. We felt this was due to a combination of HF and URI. We increased lasix x 3 days and also prescribed prednisone and doxycycline. We also referred to Pulmonary given her frequent URIs and asthma flares. Initially felt some better - less SOB. However this is now getting worse again. Very fatigued. Falls asleep during day. + orthopnea and PND. Weight up and down. Highest she got to was 236 but now back to baseline of 231 after taking extra lasix. Not wearing CPAP because machine in storage. Following a low salt diet and drinking less than 2L a day. Went to see Dr. Joya Gaskins in Pulmonary yesterday  but was turned away and told she couldn't see him until she got a referral from her PCP (Dr. Karlton Lemon) and that a HF referral wasn't sufficient. Rescheduled with Dr. Melvyn Novas next week. Not wheezing. Compliant with meds.   Echo today preliminary EF 25-30% RV ok.   LABS: 01/03/14: K 4.2 Cr 1.27 pBNP 1766 (down from 4070)  SH: Married. Lives in [place], 2 children. Was working FT at [employer] but transferring [employer]. No ETOH or smoking  FH: Mother living: DM2, HTN, no CAD        Father living: DM2, HTN  ROS: All systems negative except as listed in HPI, PMH and Problem List.  Past Medical History  Diagnosis Date  . Hypertension     currently on Labetalol 200mg  BID  . Infection     UTI;not frequent;last infection was in June  . Infection     Yeast;not frequent  . Asthma     Inhaler prn;triggered by seasonal changes mostly cold weathert;last asthma  attack years ago  . CHF (congestive heart failure) 01/2006    Has Pacemaker and Defibrillator;was on heart meds;no longer taking since pregancy  . Anemia     Iron supplements in the past  . Migraines     Can't take Procardia XL, causes migraines  . Varicose veins 1998  Developed during last pregnancy  . Other primary cardiomyopathies   . Atrial fibrillation   . Paroxysmal ventricular tachycardia   . CHF (congestive heart failure)   . Hypertension   . Acute bronchitis and bronchiolitis     Current Outpatient Prescriptions  Medication Sig Dispense Refill  . albuterol (PROVENTIL HFA;VENTOLIN HFA) 108 (90 BASE) MCG/ACT inhaler Inhale 2 puffs into the lungs every 6 (six) hours as needed. Rescue inhaler      . carvedilol (COREG) 25 MG tablet Take 1 tablet (25 mg total) by mouth 2 (two) times daily with a meal.  60 tablet  6  . cetirizine (ZYRTEC) 10 MG tablet Take 10 mg by mouth daily.      Marland Kitchen doxycycline (VIBRAMYCIN) 50 MG capsule Take 2 capsules (100 mg total) by mouth 2 (two) times daily.  14 capsule  0  . furosemide (LASIX)  80 MG tablet Take 1 tablet (80 mg total) by mouth daily.  30 tablet  2  . hydrALAZINE (APRESOLINE) 50 MG tablet Take 1.5 tablets (75 mg total) by mouth 3 (three) times daily.  105 tablet  3  . isosorbide mononitrate (IMDUR) 60 MG 24 hr tablet Take 1 tablet (60 mg total) by mouth daily.  30 tablet  3  . losartan (COZAAR) 25 MG tablet Take 1 tablet (25 mg total) by mouth daily.  30 tablet  3  . potassium chloride SA (K-DUR,KLOR-CON) 20 MEQ tablet Take 1 tablet (20 mEq total) by mouth 2 (two) times daily.  60 tablet  3  . predniSONE (STERAPRED UNI-PAK) 10 MG tablet Take 40 mg (4 tabs) for 3 days then 30 mg (3 tabs) for 3 days then 20 mg (2 tabs) for 2 days and then 10 mg (1 tab) for 2 days.  27 tablet  0  . ranitidine (ZANTAC) 150 MG tablet Take 150 mg by mouth daily.      Marland Kitchen spironolactone (ALDACTONE) 25 MG tablet Take 0.5 tablets (12.5 mg total) by mouth daily.  15 tablet  3   No current facility-administered medications for this encounter.    Filed Vitals:   01/25/14 1028  BP: 132/88  Pulse: 84  Weight: 231 lb 6.4 oz (104.962 kg)  SpO2: 94%    PHYSICAL EXAM: General: Weak apeparing No resp difficulty HEENT: normal Neck: supple. JVP 7; Carotids 2+ bilaterally; no bruits. No lymphadenopathy or thryomegaly appreciated. Cor: PMI normal. Regular rate & rhythm. +s4. No murmurs Lungs: clear Abdomen: soft, nontender, mildly distended and obese. No hepatosplenomegaly. No bruits or masses. Good bowel sounds. Extremities: no cyanosis, clubbing, rash, 1+ edema Neuro: alert & orientedx3, cranial nerves grossly intact. Moves all 4 extremities w/o difficulty. Affect pleasant.  ASSESSMENT & PLAN:  1) Chronic systolic heart failure, NICM likely r/t HTN, EF 35% (11/2012) 25-30% today, s/p ICD - NYHA III symptoms. Volume status elevated and by Optivol. EF slightly worse - Fluid up on Optivol. No AF/VT.  - Stop lasix. Start demadex 60 daily - Needs CPX. Low threshold for RHC - Reinforced the need and  importance of daily weights, a low sodium diet, and fluid restriction (less than 2 L a day). Instructed to call the HF clinic if weight increases more than 3 lbs overnight or 5 lbs in a week. 2) OSA  - she has profound fatigue. Strongly encouraged her to get her CPAP machine out of storage and start using. She will need to f/u pulmonary and get back on track with this 3) HTN - improved control  but still upper end of normal. Hopefully will improve with diuresis. Can increase Losartan at next visit as needed.  4) Asthma/chronic bronchitis -f/u with Pulmonary   Kayan Blissett,MD 11:09 AM

## 2014-01-25 NOTE — Progress Notes (Signed)
*  PRELIMINARY RESULTS* Echocardiogram 2D Echocardiogram has been performed.  [name] 01/25/2014, 12:16 PM

## 2014-02-02 ENCOUNTER — Other Ambulatory Visit (HOSPITAL_COMMUNITY): Payer: 59

## 2014-02-02 ENCOUNTER — Institutional Professional Consult (permissible substitution): Payer: 59 | Admitting: Internal Medicine

## 2014-02-04 NOTE — Addendum Note (Signed)
Encounter addended by: Jolaine Artist, MD on: 02/04/2014  9:25 PM<BR>     Documentation filed: Notes Section

## 2014-02-13 ENCOUNTER — Ambulatory Visit (HOSPITAL_COMMUNITY)
Admission: RE | Admit: 2014-02-13 | Discharge: 2014-02-13 | Disposition: A | Discharge status: Home / Self Care | Payer: 59 | Source: Ambulatory Visit | Attending: Cardiology | Admitting: Cardiology

## 2014-02-13 ENCOUNTER — Encounter (HOSPITAL_COMMUNITY): Payer: 59

## 2014-02-13 DIAGNOSIS — I5022 Chronic systolic (congestive) heart failure: Secondary | ICD-10-CM | POA: Diagnosis present

## 2014-02-13 DIAGNOSIS — I509 Heart failure, unspecified: Secondary | ICD-10-CM | POA: Insufficient documentation

## 2014-02-13 LAB — BASIC METABOLIC PANEL
ANION GAP: 11 (ref 5–15)
BUN: 17 mg/dL (ref 6–23)
CALCIUM: 8.9 mg/dL (ref 8.4–10.5)
CO2: 30 mEq/L (ref 19–32)
CREATININE: 1.16 mg/dL — AB (ref 0.50–1.10)
Chloride: 102 mEq/L (ref 96–112)
GFR, EST AFRICAN AMERICAN: 68 mL/min — AB (ref >90)
GFR, EST NON AFRICAN AMERICAN: 59 mL/min — AB (ref >90)
Glucose, Bld: 104 mg/dL — ABNORMAL HIGH (ref 70–99)
Potassium: 4.4 mEq/L (ref 3.7–5.3)
Sodium: 143 mEq/L (ref 137–147)

## 2014-02-21 ENCOUNTER — Ambulatory Visit (HOSPITAL_COMMUNITY): Payer: 59 | Attending: Internal Medicine

## 2014-02-21 DIAGNOSIS — Z6841 Body Mass Index (BMI) 40.0 and over, adult: Secondary | ICD-10-CM | POA: Insufficient documentation

## 2014-02-21 DIAGNOSIS — I5022 Chronic systolic (congestive) heart failure: Secondary | ICD-10-CM

## 2014-02-21 DIAGNOSIS — E662 Morbid (severe) obesity with alveolar hypoventilation: Secondary | ICD-10-CM | POA: Insufficient documentation

## 2014-02-21 DIAGNOSIS — R0789 Other chest pain: Secondary | ICD-10-CM | POA: Insufficient documentation

## 2014-02-21 DIAGNOSIS — I509 Heart failure, unspecified: Secondary | ICD-10-CM | POA: Diagnosis present

## 2014-02-21 DIAGNOSIS — E669 Obesity, unspecified: Secondary | ICD-10-CM | POA: Insufficient documentation

## 2014-02-26 ENCOUNTER — Encounter: Payer: Self-pay | Admitting: Internal Medicine

## 2014-02-27 ENCOUNTER — Encounter (HOSPITAL_COMMUNITY): Payer: Self-pay

## 2014-02-27 ENCOUNTER — Ambulatory Visit (HOSPITAL_COMMUNITY)
Admission: RE | Admit: 2014-02-27 | Discharge: 2014-02-27 | Disposition: A | Discharge status: Home / Self Care | Payer: 59 | Source: Ambulatory Visit | Attending: Internal Medicine | Admitting: Internal Medicine

## 2014-02-27 VITALS — BP 102/90 | HR 78 | Wt 232.8 lb

## 2014-02-27 DIAGNOSIS — J449 Chronic obstructive pulmonary disease, unspecified: Secondary | ICD-10-CM | POA: Insufficient documentation

## 2014-02-27 DIAGNOSIS — I509 Heart failure, unspecified: Secondary | ICD-10-CM | POA: Insufficient documentation

## 2014-02-27 DIAGNOSIS — J4489 Other specified chronic obstructive pulmonary disease: Secondary | ICD-10-CM | POA: Insufficient documentation

## 2014-02-27 DIAGNOSIS — G4733 Obstructive sleep apnea (adult) (pediatric): Secondary | ICD-10-CM | POA: Insufficient documentation

## 2014-02-27 DIAGNOSIS — I10 Essential (primary) hypertension: Secondary | ICD-10-CM | POA: Insufficient documentation

## 2014-02-27 DIAGNOSIS — I428 Other cardiomyopathies: Secondary | ICD-10-CM | POA: Insufficient documentation

## 2014-02-27 DIAGNOSIS — I5022 Chronic systolic (congestive) heart failure: Secondary | ICD-10-CM | POA: Diagnosis not present

## 2014-02-27 DIAGNOSIS — E669 Obesity, unspecified: Secondary | ICD-10-CM

## 2014-02-27 DIAGNOSIS — J410 Simple chronic bronchitis: Secondary | ICD-10-CM

## 2014-02-27 NOTE — Progress Notes (Signed)
Patient ID: [name], female   DOB: 05-22-1976, 38 y.o.   MRN: [id number]  PCP: Dr. Heath Gold  OB:  Dr. Leo Grosser  EP: Dr. Lovena Le  HPI: [name] is a 38 year old woman with h/o obesity, chronic systolic heart failure due to nonischemic cardiomyopathy (previously recovered, but now 40%, 11/2012) s/p ICD implantation, asthma/chronic bronchitis and severe hypertension.    Cardiac Cath 2007 -Findings consistent with nonischemic, mildly dilated cardiomyopathy. This is probably related to hypertension with hypertensive heart disease. Severe left ventricular systolic dysfunction, ejection fraction around 35% with moderate to marked global hypokinesis. Moderate pulmonary hypertension. Preserved cardiac output and cardiac index.  ECHOs 04/27/2011 45-50%.   08/20/11 ECHO 55-60%   05/19/12 EF ~40%   07/01/12 EF ~40%   08/02/12 EF~40%    2/14 EF ~50%                          5/14 EF ~35%                          She delievered her baby on September 25, 2012 without complication. Admitted 5/18 -12/14/12 with hypertensive crisis and decompensated systolic heart failure. Treated with BIPAP and IV lasix. Discharge weight 222lbs. Echo showed EF down from 50->35%. Cr bumped to 1.55 but was 1.18 at d/c.  CPX 01/2014: Resting HR 75, Peak HR 121 Peak VO2 15.2, 74.4% predicted VE/VCO2 slope: 26.4 OUES: 1.93 Peak RER 1.15 VE/MVV 61.7%  Follow up for Heart Failure: Last visit volume status elevated and switched from lasix to demadex 60 mg daily. Reports she notices a difference in UOP. Denies SOB, PND, orthopnea or CP. Able to do all chores/ADLs and can walk around the whole grocery store with no issues. Still very fatigued and falling asleep during the day. Weight at home 229 lbs. Starting to wear CPAP again and is working on wearing throughout the night. Following a low salt diet and drinking less than 2L a day.   LABS: 01/03/14: K 4.2 Cr 1.27 pBNP 1766 (down from 4070)            02/13/14: K 4.4, creatinine 1.27  SH:  Married. Lives in [place], 2 children. Working on [employer]. No ETOH or smoking  FH: Mother living: DM2, HTN, no CAD        Father living: DM2, HTN  ROS: All systems negative except as listed in HPI, PMH and Problem List.  Past Medical History  Diagnosis Date  . Hypertension     currently on Labetalol 200mg  BID  . Infection     UTI;not frequent;last infection was in June  . Infection     Yeast;not frequent  . Asthma     Inhaler prn;triggered by seasonal changes mostly cold weathert;last asthma  attack years ago  . CHF (congestive heart failure) 01/2006    Has Pacemaker and Defibrillator;was on heart meds;no longer taking since pregancy  . Anemia     Iron supplements in the past  . Migraines     Can't take Procardia XL, causes migraines  . Varicose veins 1998    Developed during last pregnancy  . Other primary cardiomyopathies   . Atrial fibrillation   . Paroxysmal ventricular tachycardia   . CHF (congestive heart failure)   . Hypertension   . Acute bronchitis and bronchiolitis     Current Outpatient Prescriptions  Medication Sig Dispense Refill  . albuterol (PROVENTIL HFA;VENTOLIN HFA) 108 (90  BASE) MCG/ACT inhaler Inhale 2 puffs into the lungs every 6 (six) hours as needed. Rescue inhaler      . carvedilol (COREG) 25 MG tablet Take 1 tablet (25 mg total) by mouth 2 (two) times daily with a meal.  60 tablet  6  . cetirizine (ZYRTEC) 10 MG tablet Take 10 mg by mouth daily.      . hydrALAZINE (APRESOLINE) 50 MG tablet Take 1.5 tablets (75 mg total) by mouth 3 (three) times daily.  105 tablet  3  . isosorbide mononitrate (IMDUR) 60 MG 24 hr tablet Take 1 tablet (60 mg total) by mouth daily.  30 tablet  3  . losartan (COZAAR) 25 MG tablet Take 1 tablet (25 mg total) by mouth daily.  30 tablet  3  . potassium chloride SA (K-DUR,KLOR-CON) 20 MEQ tablet Take 1 tablet (20 mEq total) by mouth 2 (two) times daily.  60 tablet  3  . ranitidine (ZANTAC) 150 MG tablet Take 150  mg by mouth daily.      Marland Kitchen spironolactone (ALDACTONE) 25 MG tablet Take 0.5 tablets (12.5 mg total) by mouth daily.  15 tablet  3  . torsemide (DEMADEX) 20 MG tablet Take 3 tablets (60 mg total) by mouth daily.  180 tablet  3   No current facility-administered medications for this encounter.    Filed Vitals:   02/27/14 1031  BP: 102/90  Pulse: 78  Weight: 232 lb 12.8 oz (105.597 kg)  SpO2: 97%    PHYSICAL EXAM: General: Well appearing; No resp difficulty HEENT: normal Neck: supple. JVP difficult to assess d/t body habitus but appears flat; Carotids 2+ bilaterally; no bruits. No lymphadenopathy or thryomegaly appreciated. Cor: PMI normal. Regular rate & rhythm. +s4. No murmurs Lungs: clear Abdomen: soft, nontender, mildly distended and obese. No hepatosplenomegaly. No bruits or masses. Good bowel sounds. Extremities: no cyanosis, clubbing, rash, trace edema Neuro: alert & orientedx3, cranial nerves grossly intact. Moves all 4 extremities w/o difficulty. Affect pleasant.  ASSESSMENT & PLAN:  1) Chronic systolic heart failure, NICM s/p ICD likely r/t HTN, EF 35% (11/2012) - NYHA II-III symptoms. Volume status stable. Will continue torsemide 60 mg daily and discussed the use of sliding scale diuretics. - On goal dose Coreg 25 mg BID. - Continue current doses of hydralazine/nitrates, losartan and Spiro. - Reviewed CPX test and mild functional limitation. Peak VO2 15.2 (74.4% predicted) and VE/VCo2 26.4. SOB likely related to a component of OHS. - Reinforced the need and importance of daily weights, a low sodium diet, and fluid restriction (less than 2 L a day). Instructed to call the HF clinic if weight increases more than 3 lbs overnight or 5 lbs in a week.  2) OSA  - Continues with fatigue. She reports she is trying to wear her CPAP more often and encouraged her to wear as much as she can at night. Needs follow up with pulmonary.  3) HTN - Controlled. Continue current medications.    4) Asthma/chronic bronchitis -f/u with Pulmonary  5) Obesity: - Markedly overweight. Discussed the need to lose weight and increase activity. We discussed cutting back on portions and to be as active as possible.   F/U 2 months Junie Bame B,NP-C 11:23 AM

## 2014-02-27 NOTE — Patient Instructions (Addendum)
Doing well.   You have been referred to Dr Joya Gaskins in Pulmonary  Follow up in 2 months.  Do the following things EVERYDAY: 1) Weigh yourself in the morning before breakfast. Write it down and keep it in a log. 2) Take your medicines as prescribed 3) Eat low salt foods-Limit salt (sodium) to 2000 mg per day.  4) Stay as active as you can everyday 5) Limit all fluids for the day to less than 2 liters

## 2014-03-12 ENCOUNTER — Encounter: Payer: 59 | Admitting: Internal Medicine

## 2014-03-14 ENCOUNTER — Institutional Professional Consult (permissible substitution): Payer: 59 | Admitting: Pulmonary Disease

## 2014-03-29 ENCOUNTER — Encounter: Payer: Self-pay | Admitting: Internal Medicine

## 2014-04-26 DIAGNOSIS — I5022 Chronic systolic (congestive) heart failure: Secondary | ICD-10-CM | POA: Diagnosis not present

## 2014-05-01 ENCOUNTER — Encounter (HOSPITAL_COMMUNITY): Payer: Self-pay | Admitting: Vascular Surgery

## 2014-05-01 ENCOUNTER — Encounter (HOSPITAL_COMMUNITY): Payer: Self-pay

## 2014-05-01 ENCOUNTER — Ambulatory Visit (HOSPITAL_COMMUNITY)
Admission: RE | Admit: 2014-05-01 | Discharge: 2014-05-01 | Disposition: A | Discharge status: Home / Self Care | Payer: 59 | Source: Ambulatory Visit | Attending: Internal Medicine | Admitting: Internal Medicine

## 2014-05-01 VITALS — BP 166/104 | HR 93 | Wt 247.8 lb

## 2014-05-01 DIAGNOSIS — E669 Obesity, unspecified: Secondary | ICD-10-CM | POA: Insufficient documentation

## 2014-05-01 DIAGNOSIS — I5022 Chronic systolic (congestive) heart failure: Secondary | ICD-10-CM | POA: Insufficient documentation

## 2014-05-01 DIAGNOSIS — G4733 Obstructive sleep apnea (adult) (pediatric): Secondary | ICD-10-CM | POA: Diagnosis not present

## 2014-05-01 DIAGNOSIS — J45909 Unspecified asthma, uncomplicated: Secondary | ICD-10-CM | POA: Insufficient documentation

## 2014-05-01 DIAGNOSIS — I10 Essential (primary) hypertension: Secondary | ICD-10-CM | POA: Diagnosis not present

## 2014-05-01 DIAGNOSIS — Z95810 Presence of automatic (implantable) cardiac defibrillator: Secondary | ICD-10-CM | POA: Insufficient documentation

## 2014-05-01 NOTE — Patient Instructions (Addendum)
Double Torsemide (60 mg twice a day) and Potassium (2 tabs twice a day) for 3 days, then return to normal dose  Your physician recommends that you schedule a follow-up appointment in: 1 week  Do the following things EVERYDAY: 1) Weigh yourself in the morning before breakfast. Write it down and keep it in a log. 2) Take your medicines as prescribed 3) Eat low salt foods-Limit salt (sodium) to 2000 mg per day.  4) Stay as active as you can everyday 5) Limit all fluids for the day to less than 2 liters 6)

## 2014-05-01 NOTE — Addendum Note (Signed)
Encounter addended by: Kerry Dory, CMA on: 05/01/2014  3:59 PM<BR>     Documentation filed: Patient Instructions Section

## 2014-05-01 NOTE — Progress Notes (Signed)
Patient ID: [name], female   DOB: 07/03/1976, 38 y.o.   MRN: [id number]  PCP: Dr. Heath Gold  OB:  Dr. Leo Grosser  EP: Dr. Lovena Le  HPI: [name] is a 38 year old woman with h/o obesity, chronic systolic heart failure due to nonischemic cardiomyopathy (previously recovered, but now 35%, 11/2012) s/p ICD implantation, asthma/chronic bronchitis and severe hypertension.    Cardiac Cath 2007 -Findings consistent with nonischemic, mildly dilated cardiomyopathy. This is probably related to hypertension with hypertensive heart disease. Severe left ventricular systolic dysfunction, ejection fraction around 35% with moderate to marked global hypokinesis. Moderate pulmonary hypertension. Preserved cardiac output and cardiac index.  ECHOs 04/27/2011 45-50%.   08/20/11 ECHO 55-60%   05/19/12 EF ~40%   07/01/12 EF ~40%   08/02/12 EF~40%    2/14 EF ~50%                          5/14 EF ~35%                          7/15 not finalized in computer                          She delievered her baby on September 25, 2012 without complication. Admitted 5/18 -12/14/12 with hypertensive crisis and decompensated systolic heart failure. Treated with BIPAP and IV lasix. Discharge weight 222lbs. Echo showed EF down from 50->35%. Cr bumped to 1.55 but was 1.18 at d/c.  CPX 01/2014: Resting HR 75, Peak HR 121 Peak VO2 15.2, 74.4% predicted VE/VCO2 slope: 26.4 OUES: 1.93 Peak RER 1.15 VE/MVV 61.7%  Follow up for Heart Failure: Earlier this year switched from lasix to demadex 60 mg daily with very good results. Now working in [place] and finding it harder to take her meds routinely. Over past few days has missed multiple doses. Weight up 15 pounds. BP up to. Breathing ok. + edema. No orthopnea or PND.  LABS: 01/03/14: K 4.2 Cr 1.27 pBNP 1766 (down from 4070)            02/13/14: K 4.4, creatinine 1.27  SH: Married. Lives in [place], 2 children. Working on [employer]. No ETOH or smoking  FH: Mother living: DM2,  HTN, no CAD        Father living: DM2, HTN  ROS: All systems negative except as listed in HPI, PMH and Problem List.  Past Medical History  Diagnosis Date  . Hypertension     currently on Labetalol 200mg  BID  . Infection     UTI;not frequent;last infection was in June  . Infection     Yeast;not frequent  . Asthma     Inhaler prn;triggered by seasonal changes mostly cold weathert;last asthma  attack years ago  . CHF (congestive heart failure) 01/2006    Has Pacemaker and Defibrillator;was on heart meds;no longer taking since pregancy  . Anemia     Iron supplements in the past  . Migraines     Can't take Procardia XL, causes migraines  . Varicose veins 1998    Developed during last pregnancy  . Other primary cardiomyopathies   . Atrial fibrillation   . Paroxysmal ventricular tachycardia   . CHF (congestive heart failure)   . Hypertension   . Acute bronchitis and bronchiolitis     Current Outpatient Prescriptions  Medication Sig Dispense Refill  . albuterol (PROVENTIL HFA;VENTOLIN HFA) 108 (90 BASE) MCG/ACT  inhaler Inhale 2 puffs into the lungs every 6 (six) hours as needed. Rescue inhaler      . carvedilol (COREG) 25 MG tablet Take 1 tablet (25 mg total) by mouth 2 (two) times daily with a meal.  60 tablet  6  . cetirizine (ZYRTEC) 10 MG tablet Take 10 mg by mouth daily.      . hydrALAZINE (APRESOLINE) 50 MG tablet Take 1.5 tablets (75 mg total) by mouth 3 (three) times daily.  105 tablet  3  . isosorbide mononitrate (IMDUR) 60 MG 24 hr tablet Take 1 tablet (60 mg total) by mouth daily.  30 tablet  3  . losartan (COZAAR) 25 MG tablet Take 1 tablet (25 mg total) by mouth daily.  30 tablet  3  . potassium chloride SA (K-DUR,KLOR-CON) 20 MEQ tablet Take 1 tablet (20 mEq total) by mouth 2 (two) times daily.  60 tablet  3  . ranitidine (ZANTAC) 150 MG tablet Take 150 mg by mouth daily.      Marland Kitchen spironolactone (ALDACTONE) 25 MG tablet Take 0.5 tablets (12.5 mg total) by mouth daily.  15  tablet  3  . torsemide (DEMADEX) 20 MG tablet Take 3 tablets (60 mg total) by mouth daily.  180 tablet  3   No current facility-administered medications for this encounter.    Filed Vitals:   05/01/14 1516  BP: 166/104  Pulse: 93  Weight: 247 lb 12.8 oz (112.401 kg)  SpO2: 96%    PHYSICAL EXAM: General: Well appearing; No resp difficulty HEENT: normal Neck: supple. JVP 8 ; Carotids 2+ bilaterally; no bruits. No lymphadenopathy or thryomegaly appreciated. Cor: PMI normal. Regular rate & rhythm. +s4. No murmurs Lungs: clear Abdomen: soft, nontender, mildly distended and obese. No hepatosplenomegaly. No bruits or masses. Good bowel sounds. Extremities: no cyanosis, clubbing, rash, 2+edema Neuro: alert & orientedx3, cranial nerves grossly intact. Moves all 4 extremities w/o difficulty. Affect pleasant.  ASSESSMENT & PLAN:  1) Chronic systolic heart failure, NICM s/p ICD likely r/t HTN, EF 35% (11/2012) - NYHA II-III symptoms. Volume status up in setting of missing meds. Will double demadex and potassium for 3 days. If no response call us. Stressed need to take all of her HF meds. - On goal dose Coreg 25 mg BID. - Continue current doses of hydralazine/nitrates, losartan and Spiro. - Reviewed CPX test and mild functional limitation. Peak VO2 15.2 (74.4% predicted) and VE/VCo2 26.4. SOB likely related to a component of OHS. - Reinforced the need and importance of daily weights, a low sodium diet, and fluid restriction (less than 2 L a day). Instructed to call the HF clinic if weight increases more than 3 lbs overnight or 5 lbs in a week.  2) OSA  - Continues with fatigue. She reports she is trying to wear her CPAP more often and encouraged her to wear as much as she can at night. Needs follow up with pulmonary.  3) HTN - BP markedly elevated in setting of volume overload and missing meds. Will diurese as above. Resume HF meds. Titrate losartan at next visit as tolerated.  4)  Asthma/chronic bronchitis -f/u with Pulmonary  5) Obesity: - Markedly overweight. Discussed the need to lose weight and increase activity. We discussed cutting back on portions and to be as active as possible.   F/U next week with labs  Glori Bickers, MD 3:30 PM

## 2014-05-07 ENCOUNTER — Ambulatory Visit (HOSPITAL_COMMUNITY)
Admission: RE | Admit: 2014-05-07 | Discharge: 2014-05-07 | Disposition: A | Discharge status: Home / Self Care | Payer: 59 | Source: Ambulatory Visit | Attending: Cardiology | Admitting: Cardiology

## 2014-05-07 ENCOUNTER — Encounter (HOSPITAL_COMMUNITY): Payer: Self-pay | Admitting: Unknown Physician Specialty

## 2014-05-07 ENCOUNTER — Other Ambulatory Visit: Payer: Self-pay | Admitting: Internal Medicine

## 2014-05-07 ENCOUNTER — Encounter (HOSPITAL_COMMUNITY): Payer: Self-pay

## 2014-05-07 VITALS — BP 126/88 | HR 82 | Wt 241.8 lb

## 2014-05-07 DIAGNOSIS — G4733 Obstructive sleep apnea (adult) (pediatric): Secondary | ICD-10-CM | POA: Insufficient documentation

## 2014-05-07 DIAGNOSIS — I5022 Chronic systolic (congestive) heart failure: Secondary | ICD-10-CM

## 2014-05-07 DIAGNOSIS — J449 Chronic obstructive pulmonary disease, unspecified: Secondary | ICD-10-CM | POA: Insufficient documentation

## 2014-05-07 DIAGNOSIS — I10 Essential (primary) hypertension: Secondary | ICD-10-CM

## 2014-05-07 DIAGNOSIS — E669 Obesity, unspecified: Secondary | ICD-10-CM | POA: Insufficient documentation

## 2014-05-07 DIAGNOSIS — Z79899 Other long term (current) drug therapy: Secondary | ICD-10-CM | POA: Insufficient documentation

## 2014-05-07 DIAGNOSIS — Z8249 Family history of ischemic heart disease and other diseases of the circulatory system: Secondary | ICD-10-CM | POA: Insufficient documentation

## 2014-05-07 DIAGNOSIS — I429 Cardiomyopathy, unspecified: Secondary | ICD-10-CM | POA: Insufficient documentation

## 2014-05-07 LAB — BASIC METABOLIC PANEL
ANION GAP: 12 (ref 5–15)
BUN: 20 mg/dL (ref 6–23)
CALCIUM: 9.1 mg/dL (ref 8.4–10.5)
CO2: 31 mEq/L (ref 19–32)
CREATININE: 1.22 mg/dL — AB (ref 0.50–1.10)
Chloride: 96 mEq/L (ref 96–112)
GFR, EST AFRICAN AMERICAN: 64 mL/min — AB (ref >90)
GFR, EST NON AFRICAN AMERICAN: 55 mL/min — AB (ref >90)
Glucose, Bld: 103 mg/dL — ABNORMAL HIGH (ref 70–99)
Potassium: 3.9 mEq/L (ref 3.7–5.3)
Sodium: 139 mEq/L (ref 137–147)

## 2014-05-07 MED ORDER — LOSARTAN POTASSIUM 50 MG PO TABS: 50.0000 mg | ORAL_TABLET | Freq: Every day | ORAL | Status: DC

## 2014-05-07 MED ORDER — TORSEMIDE 20 MG PO TABS: ORAL_TABLET | ORAL | Status: DC

## 2014-05-07 NOTE — Addendum Note (Signed)
Encounter addended by: Scarlette Calico, RN on: 05/07/2014  4:09 PM<BR>     Documentation filed: Patient Instructions Section, Orders

## 2014-05-07 NOTE — Patient Instructions (Signed)
Increase Torsemide to 3 tabs in AM and 1 tab in PM  Increase Losartan to 50 mg daily  Labs today  Labs in 1 week (bmet)  Your physician recommends that you schedule a follow-up appointment in: 1 month

## 2014-05-07 NOTE — Progress Notes (Signed)
Patient ID: [name], female   DOB: [birth year], 38 y.o.   MRN: [id number]  PCP: Dr. Heath Gold  OB:  Dr. Leo Grosser  EP: Dr. Lovena Le  HPI: [name] is a 38 year old woman with h/o obesity, chronic systolic heart failure due to nonischemic cardiomyopathy (previously recovered, but now 35%, 11/2012) s/p ICD implantation, asthma/chronic bronchitis and severe hypertension.    Cardiac Cath 2007 -Findings consistent with nonischemic, mildly dilated cardiomyopathy. This is probably related to hypertension with hypertensive heart disease. Severe left ventricular systolic dysfunction, ejection fraction around 35% with moderate to marked global hypokinesis. Moderate pulmonary hypertension. Preserved cardiac output and cardiac index.  ECHOs 04/27/2011 45-50%.   08/20/11 ECHO 55-60%   05/19/12 EF ~40%   07/01/12 EF ~40%   08/02/12 EF~40%    2/14 EF ~50%                          5/14 EF ~35%                          7/15 not finalized in computer                          She delievered her baby on September 25, 2012 without complication. Admitted 5/18 -12/14/12 with hypertensive crisis and decompensated systolic heart failure. Treated with BIPAP and IV lasix. Discharge weight 222lbs. Echo showed EF down from 50->35%. Cr bumped to 1.55 but was 1.18 at d/c.  CPX 01/2014: Resting HR 75, Peak HR 121 Peak VO2 15.2, 74.4% predicted VE/VCO2 slope: 26.4 OUES: 1.93 Peak RER 1.15 VE/MVV 61.7%  Follow up for Heart Failure: Earlier this year switched from lasix to demadex 60 mg daily with very good results. Now working in [place] and finding it harder to take her meds routinely. We saw her on October 6th, weight up 15 pounds after missing some medicines. BP up too. Demadex doubled from 60 daily to 60 bid. Lost 10 pounds initially but put 4 pounds back on. Watching fluid intake. Breathing ok. No orthopnea/PND. BP better. Urine not dark.   LABS: 01/03/14: K 4.2 Cr 1.27 pBNP 1766 (down from 4070)            02/13/14: K 4.4,  creatinine 1.27  SH: Married. Lives in [place], 2 children. Working on [employer]. No ETOH or smoking  FH: Mother living: DM2, HTN, no CAD        Father living: DM2, HTN  ROS: All systems negative except as listed in HPI, PMH and Problem List.  Past Medical History  Diagnosis Date  . Hypertension     currently on Labetalol 200mg  BID  . Infection     UTI;not frequent;last infection was in June  . Infection     Yeast;not frequent  . Asthma     Inhaler prn;triggered by seasonal changes mostly cold weathert;last asthma  attack years ago  . CHF (congestive heart failure) 01/2006    Has Pacemaker and Defibrillator;was on heart meds;no longer taking since pregancy  . Anemia     Iron supplements in the past  . Migraines     Can't take Procardia XL, causes migraines  . Varicose veins 1998    Developed during last pregnancy  . Other primary cardiomyopathies   . Atrial fibrillation   . Paroxysmal ventricular tachycardia   . CHF (congestive heart failure)   . Hypertension   . Acute  bronchitis and bronchiolitis     Current Outpatient Prescriptions  Medication Sig Dispense Refill  . albuterol (PROVENTIL HFA;VENTOLIN HFA) 108 (90 BASE) MCG/ACT inhaler Inhale 2 puffs into the lungs every 6 (six) hours as needed. Rescue inhaler      . carvedilol (COREG) 25 MG tablet Take 1 tablet (25 mg total) by mouth 2 (two) times daily with a meal.  60 tablet  6  . cetirizine (ZYRTEC) 10 MG tablet Take 10 mg by mouth daily.      . hydrALAZINE (APRESOLINE) 50 MG tablet Take 1.5 tablets (75 mg total) by mouth 3 (three) times daily.  105 tablet  3  . isosorbide mononitrate (IMDUR) 60 MG 24 hr tablet Take 1 tablet (60 mg total) by mouth daily.  30 tablet  3  . losartan (COZAAR) 25 MG tablet Take 1 tablet (25 mg total) by mouth daily.  30 tablet  3  . potassium chloride SA (K-DUR,KLOR-CON) 20 MEQ tablet Take 1 tablet (20 mEq total) by mouth 2 (two) times daily.  60 tablet  3  . ranitidine (ZANTAC)  150 MG tablet Take 150 mg by mouth daily.      Marland Kitchen spironolactone (ALDACTONE) 25 MG tablet Take 0.5 tablets (12.5 mg total) by mouth daily.  15 tablet  3  . torsemide (DEMADEX) 20 MG tablet Take 3 tablets (60 mg total) by mouth daily.  180 tablet  3   No current facility-administered medications for this encounter.    Filed Vitals:   05/07/14 1552  BP: 126/88  Pulse: 82  Weight: 241 lb 12.8 oz (109.68 kg)  SpO2: 97%    PHYSICAL EXAM: General: Well appearing; No resp difficulty. +nasal congestion HEENT: normal Neck: supple. JVP 6-7 ; Carotids 2+ bilaterally; no bruits. No lymphadenopathy or thryomegaly appreciated. Cor: PMI normal. Regular rate & rhythm. +s4. No murmurs Lungs: clear Abdomen: soft, nontender, mildly distended and obese. No hepatosplenomegaly. No bruits or masses. Good bowel sounds. Extremities: no cyanosis, clubbing, rash, trace edema Neuro: alert & orientedx3, cranial nerves grossly intact. Moves all 4 extremities w/o difficulty. Affect pleasant.  ASSESSMENT & PLAN:  1) Chronic systolic heart failure, NICM s/p ICD likely r/t HTN, EF 35% (11/2012) - NYHA II-III symptoms. Volume status improved. Suspect not all weight ggain is fluid but I do think she still has some fluid on board. Increase demadex to 60/20. Labs today and in 1 week.  - Continue current doses of hydralazine/nitrates and Spiro. - Increase losartan to 50 daily - Reviewed CPX test and mild functional limitation. Peak VO2 15.2 (74.4% predicted) and VE/VCo2 26.4. SOB likely related to a component of OHS. - Reinforced the need and importance of daily weights, a low sodium diet, and fluid restriction (less than 2 L a day). Instructed to call the HF clinic if weight increases more than 3 lbs overnight or 5 lbs in a week.  2) OSA  - Continues with fatigue. She reports she is trying to wear her CPAP more often and encouraged her to wear as much as she can at night. Needs follow up with pulmonary.  3) HTN - BP  improved after fluid removal  4) Asthma/chronic bronchitis -f/u with Pulmonary  5) Obesity: - Markedly overweight. Discussed the need to lose weight and increase activity. We discussed cutting back on portions and to be as active as possible.   F/U 1 month.   Glori Bickers, MD 3:54 PM

## 2014-05-15 ENCOUNTER — Ambulatory Visit (HOSPITAL_COMMUNITY)
Admission: RE | Admit: 2014-05-15 | Discharge: 2014-05-15 | Disposition: A | Discharge status: Home / Self Care | Payer: 59 | Source: Ambulatory Visit | Attending: Cardiology | Admitting: Cardiology

## 2014-05-15 DIAGNOSIS — I5022 Chronic systolic (congestive) heart failure: Secondary | ICD-10-CM

## 2014-05-15 LAB — BASIC METABOLIC PANEL
ANION GAP: 10 (ref 5–15)
BUN: 16 mg/dL (ref 6–23)
CALCIUM: 8.6 mg/dL (ref 8.4–10.5)
CO2: 30 mEq/L (ref 19–32)
CREATININE: 1.36 mg/dL — AB (ref 0.50–1.10)
Chloride: 102 mEq/L (ref 96–112)
GFR calc Af Amer: 56 mL/min — ABNORMAL LOW (ref >90)
GFR calc non Af Amer: 48 mL/min — ABNORMAL LOW (ref >90)
Glucose, Bld: 103 mg/dL — ABNORMAL HIGH (ref 70–99)
Potassium: 4 mEq/L (ref 3.7–5.3)
Sodium: 142 mEq/L (ref 137–147)

## 2014-05-16 ENCOUNTER — Encounter (HOSPITAL_COMMUNITY): Payer: Self-pay | Admitting: Vascular Surgery

## 2014-05-28 ENCOUNTER — Encounter (HOSPITAL_COMMUNITY): Payer: Self-pay

## 2014-06-07 ENCOUNTER — Encounter (HOSPITAL_COMMUNITY): Payer: Self-pay

## 2014-06-29 ENCOUNTER — Other Ambulatory Visit (HOSPITAL_COMMUNITY): Payer: Self-pay | Admitting: Anesthesiology

## 2014-07-30 ENCOUNTER — Telehealth (HOSPITAL_COMMUNITY): Payer: Self-pay | Admitting: Vascular Surgery

## 2014-07-30 NOTE — Telephone Encounter (Signed)
Pt called Hydroxcycut diet drug.Marland Kitchen Please advise

## 2014-07-30 NOTE — Telephone Encounter (Signed)
Advised against Hydroxycut or any diet pills.  Recommended diet similar to Atkins or weight watchers, low fat, low salt, high fiber and protein and portion control.  Encouraged to try out their websites or apps on the smart phone to help stay on track.  Renee Pain

## 2014-08-06 ENCOUNTER — Telehealth: Payer: Self-pay | Admitting: Internal Medicine

## 2014-08-06 NOTE — Telephone Encounter (Signed)
Device did not fire/shock. Device started alarming on Saturday and has done so once a day on Sunday and today as well. Pt was scheduled with [facility] for 08-07-14 @ 1045. Device will need to be checked and alarm turned off.

## 2014-08-06 NOTE — Telephone Encounter (Signed)
New message     1. Has your device fired? Started on Saturday @ 8:25 am / Sunday  / this am  2. Is you device beeping? Yes,   3. Are you experiencing draining or swelling at device site? No   4. Are you calling to see if we received your device transmission? No   5. Have you passed out? No . Yesterday felt lightheaded.

## 2014-08-07 ENCOUNTER — Other Ambulatory Visit (HOSPITAL_COMMUNITY): Payer: Self-pay | Admitting: Cardiology

## 2014-08-07 ENCOUNTER — Ambulatory Visit (INDEPENDENT_AMBULATORY_CARE_PROVIDER_SITE_OTHER): Payer: Self-pay | Admitting: Internal Medicine

## 2014-08-07 ENCOUNTER — Encounter: Payer: Self-pay | Admitting: Internal Medicine

## 2014-08-07 ENCOUNTER — Other Ambulatory Visit (HOSPITAL_COMMUNITY): Payer: Self-pay | Admitting: Anesthesiology

## 2014-08-07 ENCOUNTER — Encounter: Payer: Self-pay | Admitting: *Deleted

## 2014-08-07 VITALS — BP 130/78 | HR 76 | Ht 61.5 in | Wt 250.6 lb

## 2014-08-07 DIAGNOSIS — I509 Heart failure, unspecified: Secondary | ICD-10-CM

## 2014-08-07 DIAGNOSIS — Z95810 Presence of automatic (implantable) cardiac defibrillator: Secondary | ICD-10-CM

## 2014-08-07 DIAGNOSIS — I5022 Chronic systolic (congestive) heart failure: Secondary | ICD-10-CM

## 2014-08-07 DIAGNOSIS — I5023 Acute on chronic systolic (congestive) heart failure: Secondary | ICD-10-CM

## 2014-08-07 DIAGNOSIS — I10 Essential (primary) hypertension: Secondary | ICD-10-CM

## 2014-08-07 LAB — MDC_IDC_ENUM_SESS_TYPE_INCLINIC
Date Time Interrogation Session: 20160112110338
HIGH POWER IMPEDANCE MEASURED VALUE: 40 Ohm
HIGH POWER IMPEDANCE MEASURED VALUE: 42 Ohm
HIGH POWER IMPEDANCE MEASURED VALUE: 43 Ohm
HIGH POWER IMPEDANCE MEASURED VALUE: 43 Ohm
HIGH POWER IMPEDANCE MEASURED VALUE: 44 Ohm
HIGH POWER IMPEDANCE MEASURED VALUE: 49 Ohm
HIGH POWER IMPEDANCE MEASURED VALUE: 49 Ohm
HIGH POWER IMPEDANCE MEASURED VALUE: 51 Ohm
HIGH POWER IMPEDANCE MEASURED VALUE: 53 Ohm
HIGH POWER IMPEDANCE MEASURED VALUE: 54 Ohm
HighPow Impedance: 39 Ohm
HighPow Impedance: 40 Ohm
HighPow Impedance: 41 Ohm
HighPow Impedance: 42 Ohm
HighPow Impedance: 42 Ohm
HighPow Impedance: 43 Ohm
HighPow Impedance: 43 Ohm
HighPow Impedance: 43 Ohm
HighPow Impedance: 43 Ohm
HighPow Impedance: 44 Ohm
HighPow Impedance: 45 Ohm
HighPow Impedance: 47 Ohm
HighPow Impedance: 49 Ohm
HighPow Impedance: 49 Ohm
HighPow Impedance: 49 Ohm
HighPow Impedance: 50 Ohm
HighPow Impedance: 51 Ohm
HighPow Impedance: 51 Ohm
HighPow Impedance: 51 Ohm
HighPow Impedance: 52 Ohm
HighPow Impedance: 54 Ohm
Lead Channel Impedance Value: 360 Ohm
Lead Channel Impedance Value: 368 Ohm
Lead Channel Impedance Value: 376 Ohm
Lead Channel Impedance Value: 376 Ohm
Lead Channel Impedance Value: 384 Ohm
Lead Channel Impedance Value: 384 Ohm
Lead Channel Impedance Value: 384 Ohm
Lead Channel Impedance Value: 400 Ohm
Lead Channel Sensing Intrinsic Amplitude: 17.4 mV
Lead Channel Sensing Intrinsic Amplitude: 17.5 mV
Lead Channel Sensing Intrinsic Amplitude: 17.6 mV
Lead Channel Sensing Intrinsic Amplitude: 17.6 mV
Lead Channel Sensing Intrinsic Amplitude: 17.6 mV
Lead Channel Sensing Intrinsic Amplitude: 17.6 mV
Lead Channel Sensing Intrinsic Amplitude: 17.8 mV
Lead Channel Sensing Intrinsic Amplitude: 17.8 mV
Lead Channel Sensing Intrinsic Amplitude: 18.1 mV
Lead Channel Sensing Intrinsic Amplitude: 18.1 mV
Lead Channel Setting Pacing Amplitude: 3 V
Lead Channel Setting Pacing Pulse Width: 0.7 ms
MDC IDC MSMT BATTERY VOLTAGE: 2.64 V
MDC IDC MSMT LEADCHNL RV IMPEDANCE VALUE: 352 Ohm
MDC IDC MSMT LEADCHNL RV IMPEDANCE VALUE: 368 Ohm
MDC IDC MSMT LEADCHNL RV IMPEDANCE VALUE: 368 Ohm
MDC IDC MSMT LEADCHNL RV IMPEDANCE VALUE: 376 Ohm
MDC IDC MSMT LEADCHNL RV IMPEDANCE VALUE: 376 Ohm
MDC IDC MSMT LEADCHNL RV IMPEDANCE VALUE: 384 Ohm
MDC IDC MSMT LEADCHNL RV IMPEDANCE VALUE: 384 Ohm
MDC IDC MSMT LEADCHNL RV PACING THRESHOLD AMPLITUDE: 1.5 V
MDC IDC MSMT LEADCHNL RV PACING THRESHOLD PULSEWIDTH: 0.7 ms
MDC IDC MSMT LEADCHNL RV SENSING INTR AMPL: 17.4 mV
MDC IDC MSMT LEADCHNL RV SENSING INTR AMPL: 17.6 mV
MDC IDC MSMT LEADCHNL RV SENSING INTR AMPL: 17.6 mV
MDC IDC MSMT LEADCHNL RV SENSING INTR AMPL: 17.9 mV
MDC IDC MSMT LEADCHNL RV SENSING INTR AMPL: 18 mV
MDC IDC SET LEADCHNL RV SENSING SENSITIVITY: 0.3 mV
MDC IDC SET ZONE DETECTION INTERVAL: 330 ms
MDC IDC STAT BRADY RV PERCENT PACED: 0 %
Zone Setting Detection Interval: 250 ms
Zone Setting Detection Interval: 330 ms

## 2014-08-07 MED ORDER — HYDRALAZINE HCL 50 MG PO TABS
75.0000 mg | ORAL_TABLET | Freq: Three times a day (TID) | ORAL | Status: DC
Start: 2014-08-07 — End: 2015-03-28

## 2014-08-07 NOTE — Assessment & Plan Note (Signed)
Her blood pressure is well controlled. No change in meds. 

## 2014-08-07 NOTE — Patient Instructions (Signed)
  Your physician recommends that you schedule a follow-up appointment in: 3 weeks with Dr Lovena Le  Your physician has requested that you have an echocardiogram. Echocardiography is a painless test that uses sound waves to create images of your heart. It provides your doctor with information about the size and shape of your heart and how well your heart's chambers and valves are working. This procedure takes approximately one hour. There are no restrictions for this procedure.

## 2014-08-07 NOTE — Progress Notes (Signed)
HPI [name] (previously [name]) returns today for followup. She is a very pleasant 39 yo woman with a h/o a non-ischemic CM, chronic congestive heart failure, s/p ICD implant. She has reached ERI on her ICD. She has not had an assessment in her LV function in over 18 months. She has given birth to a girl who is now a year old and doing well. She has not had syncope or ICD shock. No edema. She feels well.  Allergies  Allergen Reactions  . Percocet [Oxycodone-Acetaminophen] Shortness Of Breath     Current Outpatient Prescriptions  Medication Sig Dispense Refill  . albuterol (PROVENTIL HFA;VENTOLIN HFA) 108 (90 BASE) MCG/ACT inhaler Inhale 2 puffs into the lungs every 6 (six) hours as needed for wheezing or shortness of breath. Rescue inhaler    . carvedilol (COREG) 25 MG tablet Take 1 tablet (25 mg total) by mouth 2 (two) times daily with a meal. 60 tablet 6  . cetirizine (ZYRTEC) 10 MG tablet Take 10 mg by mouth daily as needed for allergies or rhinitis.     . hydrALAZINE (APRESOLINE) 50 MG tablet Take 1.5 tablets (75 mg total) by mouth 3 (three) times daily. 105 tablet 3  . isosorbide mononitrate (IMDUR) 60 MG 24 hr tablet TAKE 1 TABLET BY MOUTH DAILY. 30 tablet 3  . losartan (COZAAR) 50 MG tablet Take 1 tablet (50 mg total) by mouth daily. 30 tablet 3  . potassium chloride SA (K-DUR,KLOR-CON) 20 MEQ tablet TAKE 1 TABLET BY MOUTH 2 TIMES DAILY. 60 tablet 3  . ranitidine (ZANTAC) 150 MG tablet Take 150 mg by mouth daily.    Marland Kitchen spironolactone (ALDACTONE) 25 MG tablet TAKE 1/2 TABLET BY MOUTH DAILY. 15 tablet 3  . torsemide (DEMADEX) 20 MG tablet Take 3 tabs in AM and 1 tab in PM (Patient taking differently: Take 3 tablets by mouth in AM and 1 tablet by mouth in PM) 120 tablet 5   No current facility-administered medications for this visit.     Past Medical History  Diagnosis Date  . Hypertension     currently on Labetalol 200mg  BID  . Infection     UTI;not frequent;last  infection was in June  . Infection     Yeast;not frequent  . Asthma     Inhaler prn;triggered by seasonal changes mostly cold weathert;last asthma  attack years ago  . CHF (congestive heart failure) 01/2006    Has Pacemaker and Defibrillator;was on heart meds;no longer taking since pregancy  . Anemia     Iron supplements in the past  . Migraines     Can't take Procardia XL, causes migraines  . Varicose veins 1998    Developed during last pregnancy  . Other primary cardiomyopathies   . Atrial fibrillation   . Paroxysmal ventricular tachycardia   . CHF (congestive heart failure)   . Hypertension   . Acute bronchitis and bronchiolitis     ROS:   All systems reviewed and negative except as noted in the HPI.   Past Surgical History  Procedure Laterality Date  . Cesarean section  1998    d/t preeclampsia  . Breast reduction surgery  03/05/1994    d/t back, shoulder, and neck pain (42 F)  . Insert / replace / remove pacemaker   02/10/2006    Status post implantable cardioverter-defibrillator insertion  .Marland KitchenMarland KitchenMarland KitchenThe Medtronic Maximo VR, model 7232, single chamber  . Cardiac catheterization      EF 30-35%  . Cardiac  defibrillator placement       Family History  Problem Relation Age of Onset  . Sickle cell trait Daughter   . Other Father     Varicose veins  . Asthma Daughter   . Seizures Maternal Uncle   . Migraines Mother   . Migraines Daughter   . Migraines Cousin     Maternal  . Cancer Paternal Grandmother     Stomach  . Cancer Maternal Uncle     Prostate  . Cancer Maternal Uncle     Lung  . Ulcers Mother   . Lupus Maternal Aunt   . Hypertension Father   . Hypertension Paternal Aunt   . Other Daughter     blood transfusion @ birth;platelets were low  . Rheum arthritis Mother   . Diabetes Father     Controlled w/ diet and exercise  . Diabetes Maternal Aunt   . Diabetes Paternal Aunt     x 2  . Diabetes Paternal Uncle   . Hypertension    . Heart disease        History   Social History  . Marital Status: Married    Spouse Name: Gudrun Axe    Number of Children: 1  . Years of Education: 15   Occupational History  . 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 [place] History Main Topics  . Smoking status: Never Smoker   . Smokeless tobacco: Never Used  . Alcohol Use: No  . Drug Use: No  . Sexual Activity:    Partners: Male    Birth Control/ Protection: Condom, Pill, None   Other Topics Concern  . Not on file   Social History Narrative   ** Merged History Encounter **         BP 130/78 mmHg  Pulse 76  Ht 5' 1.5" (1.562 m)  Wt 250 lb 9.6 oz (113.671 kg)  BMI 46.59 kg/m2  Physical Exam:  Well appearing 39 yo woman, NAD HEENT: Unremarkable Neck:  No JVD, no thyromegally Lymphatics:  No adenopathy Back:  No CVA tenderness Lungs:  Clear with no wheezes ,rales, or rhonchi HEART:  Regular rate rhythm, no murmurs, no rubs, no clicks Abd:  soft, positive bowel sounds, no organomegally, no rebound, no guarding Ext:  2 plus pulses, no edema, no cyanosis, no clubbing Skin:  No rashes no nodules Neuro:  CN II through XII intact, motor grossly intact   DEVICE  Normal device function.  See PaceArt for details.   Assess/Plan:

## 2014-08-07 NOTE — Assessment & Plan Note (Signed)
Her device is at [facility]. She has not undergone an echo for 18 months. Will schedule. If her EF is 35% or less then we will schedule her for ICD generator change. If her EF is 36% or greater then we will remove her generator and possibly her lead. I discussed the pro's and con's to both approaches.

## 2014-08-07 NOTE — Assessment & Plan Note (Signed)
Her symptoms are class 2A. Will follow. She will undergo 2D echo.

## 2014-08-15 ENCOUNTER — Ambulatory Visit (HOSPITAL_COMMUNITY): Payer: No Typology Code available for payment source | Attending: Internal Medicine | Admitting: Radiology

## 2014-08-15 ENCOUNTER — Other Ambulatory Visit (HOSPITAL_COMMUNITY): Payer: Self-pay | Admitting: Cardiology

## 2014-08-15 ENCOUNTER — Encounter (HOSPITAL_COMMUNITY): Payer: Self-pay | Admitting: Radiology

## 2014-08-15 DIAGNOSIS — E669 Obesity, unspecified: Secondary | ICD-10-CM | POA: Insufficient documentation

## 2014-08-15 DIAGNOSIS — I5022 Chronic systolic (congestive) heart failure: Secondary | ICD-10-CM | POA: Diagnosis present

## 2014-08-15 DIAGNOSIS — I5023 Acute on chronic systolic (congestive) heart failure: Secondary | ICD-10-CM

## 2014-08-15 DIAGNOSIS — I10 Essential (primary) hypertension: Secondary | ICD-10-CM | POA: Insufficient documentation

## 2014-08-15 MED ORDER — POTASSIUM CHLORIDE CRYS ER 20 MEQ PO TBCR: EXTENDED_RELEASE_TABLET | ORAL | Status: DC

## 2014-08-15 MED ORDER — ISOSORBIDE MONONITRATE ER 60 MG PO TB24: 60.0000 mg | ORAL_TABLET | Freq: Every day | ORAL | Status: DC

## 2014-08-15 NOTE — Progress Notes (Signed)
Echocardiogram performed.  

## 2014-08-16 ENCOUNTER — Encounter: Payer: Self-pay | Admitting: Internal Medicine

## 2014-08-27 ENCOUNTER — Telehealth: Payer: Self-pay | Admitting: Internal Medicine

## 2014-08-27 DIAGNOSIS — I472 Ventricular tachycardia, unspecified: Secondary | ICD-10-CM

## 2014-08-27 NOTE — Telephone Encounter (Signed)
Spoke with patient and I have her scheduled for 09/10/14 at 7:30am

## 2014-08-27 NOTE — Telephone Encounter (Signed)
New Message  Pt wanted to speak w/ Claiborne Billings about upcoming surgery- how long will surgery take? Recovery time? Please call back on work # and ask for [employer].

## 2014-08-29 ENCOUNTER — Other Ambulatory Visit (INDEPENDENT_AMBULATORY_CARE_PROVIDER_SITE_OTHER): Payer: No Typology Code available for payment source | Admitting: *Deleted

## 2014-08-29 ENCOUNTER — Encounter: Payer: Self-pay | Admitting: *Deleted

## 2014-08-29 DIAGNOSIS — I472 Ventricular tachycardia, unspecified: Secondary | ICD-10-CM

## 2014-08-29 NOTE — Addendum Note (Signed)
Addended by: Eulis Foster on: 08/29/2014 04:39 PM   Modules accepted: Orders

## 2014-08-30 LAB — BASIC METABOLIC PANEL
BUN: 21 mg/dL (ref 6–23)
CALCIUM: 8.8 mg/dL (ref 8.4–10.5)
CHLORIDE: 102 meq/L (ref 96–112)
CO2: 31 meq/L (ref 19–32)
Creatinine, Ser: 1.17 mg/dL (ref 0.40–1.20)
GFR: 65.79 mL/min (ref >60.00)
Glucose, Bld: 117 mg/dL — ABNORMAL HIGH (ref 70–99)
Potassium: 3.5 mEq/L (ref 3.5–5.1)
Sodium: 141 mEq/L (ref 135–145)

## 2014-08-30 LAB — CBC WITH DIFFERENTIAL/PLATELET
BASOS PCT: 0.5 % (ref 0.0–3.0)
Basophils Absolute: 0 10*3/uL (ref 0.0–0.1)
EOS ABS: 0.1 10*3/uL (ref 0.0–0.7)
EOS PCT: 1.9 % (ref 0.0–5.0)
HCT: 36.9 % (ref 36.0–46.0)
Hemoglobin: 12.1 g/dL (ref 12.0–15.0)
Lymphocytes Relative: 37.8 % (ref 12.0–46.0)
Lymphs Abs: 2.8 10*3/uL (ref 0.7–4.0)
MCHC: 32.7 g/dL (ref 30.0–36.0)
MCV: 80 fl (ref 78.0–100.0)
Monocytes Absolute: 0.4 10*3/uL (ref 0.1–1.0)
Monocytes Relative: 5.6 % (ref 3.0–12.0)
Neutro Abs: 4 10*3/uL (ref 1.4–7.7)
Neutrophils Relative %: 54.2 % (ref 43.0–77.0)
Platelets: 262 10*3/uL (ref 150.0–400.0)
RBC: 4.61 Mil/uL (ref 3.87–5.11)
RDW: 17.4 % — ABNORMAL HIGH (ref 11.5–15.5)
WBC: 7.4 10*3/uL (ref 4.0–10.5)

## 2014-09-05 ENCOUNTER — Telehealth: Payer: Self-pay | Admitting: Internal Medicine

## 2014-09-05 ENCOUNTER — Encounter (HOSPITAL_COMMUNITY): Payer: Self-pay | Admitting: Pharmacy Technician

## 2014-09-05 NOTE — Telephone Encounter (Signed)
Spoke with EP lab and it is okay to have her pins in her hair  She is aware

## 2014-09-05 NOTE — Telephone Encounter (Signed)
New message      Pt is having a Biochemist, clinical and want to know if she can have bobbie pins in her hair?  He hair is "up" and want to know if she can keep the bobbie pins in her hair

## 2014-09-09 DIAGNOSIS — I5022 Chronic systolic (congestive) heart failure: Secondary | ICD-10-CM | POA: Diagnosis not present

## 2014-09-09 DIAGNOSIS — G43909 Migraine, unspecified, not intractable, without status migrainosus: Secondary | ICD-10-CM | POA: Diagnosis not present

## 2014-09-09 DIAGNOSIS — J45909 Unspecified asthma, uncomplicated: Secondary | ICD-10-CM | POA: Diagnosis not present

## 2014-09-09 DIAGNOSIS — Z8249 Family history of ischemic heart disease and other diseases of the circulatory system: Secondary | ICD-10-CM | POA: Diagnosis not present

## 2014-09-09 DIAGNOSIS — Z4502 Encounter for adjustment and management of automatic implantable cardiac defibrillator: Secondary | ICD-10-CM | POA: Diagnosis present

## 2014-09-09 DIAGNOSIS — Z79899 Other long term (current) drug therapy: Secondary | ICD-10-CM | POA: Diagnosis not present

## 2014-09-09 DIAGNOSIS — I4891 Unspecified atrial fibrillation: Secondary | ICD-10-CM | POA: Diagnosis not present

## 2014-09-09 DIAGNOSIS — I10 Essential (primary) hypertension: Secondary | ICD-10-CM | POA: Diagnosis not present

## 2014-09-09 MED ORDER — SODIUM CHLORIDE 0.9 % IR SOLN
80.0000 mg | Status: DC
  Filled 2014-09-09: qty 2

## 2014-09-09 MED ORDER — CEFAZOLIN SODIUM-DEXTROSE 2-3 GM-% IV SOLR: 2.0000 g | INTRAVENOUS | Status: DC

## 2014-09-10 ENCOUNTER — Ambulatory Visit (HOSPITAL_COMMUNITY)
Admission: RE | Admit: 2014-09-10 | Discharge: 2014-09-10 | Disposition: A | Discharge status: Home / Self Care | Payer: No Typology Code available for payment source | Source: Ambulatory Visit | Attending: Internal Medicine | Admitting: Internal Medicine

## 2014-09-10 ENCOUNTER — Encounter (HOSPITAL_COMMUNITY)
Admission: RE | Disposition: A | Discharge status: Home / Self Care | Payer: Self-pay | Source: Ambulatory Visit | Attending: Internal Medicine

## 2014-09-10 ENCOUNTER — Encounter (HOSPITAL_COMMUNITY): Payer: Self-pay | Admitting: Internal Medicine

## 2014-09-10 DIAGNOSIS — Z4502 Encounter for adjustment and management of automatic implantable cardiac defibrillator: Secondary | ICD-10-CM | POA: Insufficient documentation

## 2014-09-10 DIAGNOSIS — G43909 Migraine, unspecified, not intractable, without status migrainosus: Secondary | ICD-10-CM | POA: Insufficient documentation

## 2014-09-10 DIAGNOSIS — I10 Essential (primary) hypertension: Secondary | ICD-10-CM | POA: Insufficient documentation

## 2014-09-10 DIAGNOSIS — Z8249 Family history of ischemic heart disease and other diseases of the circulatory system: Secondary | ICD-10-CM | POA: Insufficient documentation

## 2014-09-10 DIAGNOSIS — J45909 Unspecified asthma, uncomplicated: Secondary | ICD-10-CM | POA: Insufficient documentation

## 2014-09-10 DIAGNOSIS — I4891 Unspecified atrial fibrillation: Secondary | ICD-10-CM | POA: Insufficient documentation

## 2014-09-10 DIAGNOSIS — Z79899 Other long term (current) drug therapy: Secondary | ICD-10-CM | POA: Insufficient documentation

## 2014-09-10 DIAGNOSIS — I5022 Chronic systolic (congestive) heart failure: Secondary | ICD-10-CM | POA: Insufficient documentation

## 2014-09-10 HISTORY — PX: IMPLANTABLE CARDIOVERTER DEFIBRILLATOR (ICD) GENERATOR CHANGE: SHX5469

## 2014-09-10 LAB — SURGICAL PCR SCREEN
MRSA, PCR: NEGATIVE
Staphylococcus aureus: NEGATIVE

## 2014-09-10 LAB — PREGNANCY, URINE: Preg Test, Ur: NEGATIVE

## 2014-09-10 SURGERY — ICD GENERATOR CHANGE

## 2014-09-10 MED ORDER — MUPIROCIN 2 % EX OINT
TOPICAL_OINTMENT | CUTANEOUS | Status: AC
  Filled 2014-09-10: qty 22

## 2014-09-10 MED ORDER — LIDOCAINE HCL (PF) 1 % IJ SOLN
INTRAMUSCULAR | Status: AC
  Filled 2014-09-10: qty 60

## 2014-09-10 MED ORDER — SODIUM CHLORIDE 0.9 % IV SOLN
INTRAVENOUS | Status: DC
  Administered 2014-09-10: 06:00:00 via INTRAVENOUS

## 2014-09-10 MED ORDER — ONDANSETRON HCL 4 MG/2ML IJ SOLN: 4.0000 mg | Freq: Four times a day (QID) | INTRAMUSCULAR | Status: DC | PRN

## 2014-09-10 MED ORDER — FENTANYL CITRATE 0.05 MG/ML IJ SOLN
INTRAMUSCULAR | Status: AC
  Filled 2014-09-10: qty 2

## 2014-09-10 MED ORDER — MIDAZOLAM HCL 5 MG/5ML IJ SOLN
INTRAMUSCULAR | Status: AC
  Filled 2014-09-10: qty 5

## 2014-09-10 MED ORDER — CHLORHEXIDINE GLUCONATE 4 % EX LIQD
60.0000 mL | Freq: Once | CUTANEOUS | Status: DC
  Filled 2014-09-10: qty 60

## 2014-09-10 MED ORDER — MUPIROCIN 2 % EX OINT
1.0000 "application " | TOPICAL_OINTMENT | Freq: Once | CUTANEOUS | Status: AC
  Administered 2014-09-10: 1 via TOPICAL
  Filled 2014-09-10: qty 22

## 2014-09-10 NOTE — CV Procedure (Signed)
Electrophysiology Procedure Note  Procedure: ICD generator removal and insertion of a new ICD  Pre-operative diagnosis: chronic systolic heart failure, class 2, EF 25%, s/p ICD which has reached ERI  Post-operative diagnosis: same as preoperative diagnosis  Description of the Procedure: After informed consent was obtained, the patient was taken to the diagnostic electrophysiology laboratory in the fasting state. After the usual preparation draping, intravenous Versed and fentanyl were used for sedation. 30 cc of lidocaine was infiltrated into the left infraclavicular region. A 6 cm incision was carried out. Electrocautery was utilized to dissect down to the ICD pocket. There was dense fibrous scar tissue. Electrocautery was utilized to free up the ICD generator. The lead was bound down tightly. Left cautery was utilized to free up the ICD lead. The pocket was irrigated with antibiotic irrigation. The generator was removed. The new Medtronic single-chamber ICD, serial [id number] was connected to the old ICD lead and placed back in the subcutaneous pocket. The old lead had satisfactory sensing and pacing parameters. The R-wave was 20. The impedance 361. The threshold 1.25 V at 0.7 ms. With the satisfactory parameters, the pocket was irrigated with antibiotic irrigation. The incision was closed with 2 layers of Vicryl suture. Benzoin and Steri-Strips her pain on the skin. The patient was returned to the recovery area in satisfactory condition.  Complications: No immediate procedural complications  Estimated blood loss: Less than 10 cc  Conclusion: Successful insertion of a new Medtronic ICD after removal of a previously implanted ICD which had reached elective replacement.  Cristopher Peru, M.D.

## 2014-09-10 NOTE — H&P (Signed)
HPI [name] (previously [name]) returns today for followup. She is a very pleasant 39 yo woman with a h/o a non-ischemic CM, chronic congestive heart failure, s/p ICD implant. She has reached ERI on her ICD. She has not had an assessment in her LV function in over 18 months. She has given birth to a girl who is now a year old and doing well. She has not had syncope or ICD shock. No edema. She feels well.  Allergies  Allergen Reactions  . Percocet [Oxycodone-Acetaminophen] Shortness Of Breath     Current Outpatient Prescriptions  Medication Sig Dispense Refill  . albuterol (PROVENTIL HFA;VENTOLIN HFA) 108 (90 BASE) MCG/ACT inhaler Inhale 2 puffs into the lungs every 6 (six) hours as needed for wheezing or shortness of breath. Rescue inhaler    . carvedilol (COREG) 25 MG tablet Take 1 tablet (25 mg total) by mouth 2 (two) times daily with a meal. 60 tablet 6  . cetirizine (ZYRTEC) 10 MG tablet Take 10 mg by mouth daily as needed for allergies or rhinitis.     . hydrALAZINE (APRESOLINE) 50 MG tablet Take 1.5 tablets (75 mg total) by mouth 3 (three) times daily. 105 tablet 3  . isosorbide mononitrate (IMDUR) 60 MG 24 hr tablet TAKE 1 TABLET BY MOUTH DAILY. 30 tablet 3  . losartan (COZAAR) 50 MG tablet Take 1 tablet (50 mg total) by mouth daily. 30 tablet 3  . potassium chloride SA (K-DUR,KLOR-CON) 20 MEQ tablet TAKE 1 TABLET BY MOUTH 2 TIMES DAILY. 60 tablet 3  . ranitidine (ZANTAC) 150 MG tablet Take 150 mg by mouth daily.    Marland Kitchen spironolactone (ALDACTONE) 25 MG tablet TAKE 1/2 TABLET BY MOUTH DAILY. 15 tablet 3  . torsemide (DEMADEX) 20 MG tablet Take 3 tabs in AM and 1 tab in PM (Patient taking differently: Take 3 tablets by mouth in AM and 1 tablet by mouth in PM) 120 tablet 5   No current facility-administered medications for this visit.     Past Medical History  Diagnosis Date  . Hypertension      currently on Labetalol 200mg  BID  . Infection     UTI;not frequent;last infection was in June  . Infection     Yeast;not frequent  . Asthma     Inhaler prn;triggered by seasonal changes mostly cold weathert;last asthma attack years ago  . CHF (congestive heart failure) 01/2006    Has Pacemaker and Defibrillator;was on heart meds;no longer taking since pregancy  . Anemia     Iron supplements in the past  . Migraines     Can't take Procardia XL, causes migraines  . Varicose veins 1998    Developed during last pregnancy  . Other primary cardiomyopathies   . Atrial fibrillation   . Paroxysmal ventricular tachycardia   . CHF (congestive heart failure)   . Hypertension   . Acute bronchitis and bronchiolitis     ROS:  All systems reviewed and negative except as noted in the HPI.   Past Surgical History  Procedure Laterality Date  . Cesarean section  1998    d/t preeclampsia  . Breast reduction surgery  03/05/1994    d/t back, shoulder, and neck pain (42 F)  . Insert / replace / remove pacemaker  02/10/2006    Status post implantable cardioverter-defibrillator insertion .Marland KitchenMarland KitchenMarland KitchenThe Medtronic Maximo VR, model 7232, single chamber  . Cardiac catheterization      EF 30-35%  . Cardiac defibrillator placement  Family History  Problem Relation Age of Onset  . Sickle cell trait Daughter   . Other Father     Varicose veins  . Asthma Daughter   . Seizures Maternal Uncle   . Migraines Mother   . Migraines Daughter   . Migraines Cousin     Maternal  . Cancer Paternal Grandmother     Stomach  . Cancer Maternal Uncle     Prostate  . Cancer Maternal Uncle     Lung  . Ulcers Mother   . Lupus Maternal Aunt   . Hypertension Father   . Hypertension Paternal Aunt   . Other Daughter     blood  transfusion @ birth;platelets were low  . Rheum arthritis Mother   . Diabetes Father     Controlled w/ diet and exercise  . Diabetes Maternal Aunt   . Diabetes Paternal Aunt     x 2  . Diabetes Paternal Uncle   . Hypertension    . Heart disease       History   Social History  . Marital Status: Married    Spouse Name: [name]    Number of Children: 1  . Years of Education: 15   Occupational History  . 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 [place] History Main Topics  . Smoking status: Never Smoker   . Smokeless tobacco: Never Used  . Alcohol Use: No  . Drug Use: No  . Sexual Activity:    Partners: Male    Birth Control/ Protection: Condom, Pill, None   Other Topics Concern  . Not on file   Social History Narrative   ** Merged History Encounter **        BP 130/78 mmHg  Pulse 76  Ht 5' 1.5" (1.562 m)  Wt 250 lb 9.6 oz (113.671 kg)  BMI 46.59 kg/m2  Physical Exam:  Well appearing 39 yo woman, NAD HEENT: Unremarkable Neck: No JVD, no thyromegally Lymphatics: No adenopathy Back: No CVA tenderness Lungs: Clear with no wheezes ,rales, or rhonchi HEART: Regular rate rhythm, no murmurs, no rubs, no clicks Abd: soft, positive bowel sounds, no organomegally, no rebound, no guarding Ext: 2 plus pulses, no edema, no cyanosis, no clubbing Skin: No rashes no nodules Neuro: CN II through XII intact, motor grossly intact   DEVICE  Normal device function. See PaceArt for details.   Assess/Plan:            Cardiac defibrillator in situ - Evans Lance, MD at 08/07/2014 11:16 AM     Status: Written Related Problem: Cardiac defibrillator in situ   Expand All Collapse All   Her device is at ERI. She has not undergone an echo for 18 months. Will schedule. If her EF is 35% or less then we will schedule her for ICD  generator change. If her EF is 36% or greater then we will remove her generator and possibly her lead. I discussed the pro's and con's to both approaches.             Chronic systolic heart failure - Evans Lance, MD at 08/07/2014 11:16 AM     Status: Written Related Problem: Chronic systolic heart failure   Expand All Collapse All   Her symptoms are class 2A. Will follow. She will undergo 2D echo.            Uncontrolled hypertension - Evans Lance, MD at 08/07/2014 11:18  AM     Status: Written Related Problem: Uncontrolled hypertension   Expand All Collapse All   Her blood pressure is well controlled. No change in meds.       EP Attending  Patient seen and examined. Since prior clinic visit, the patient has had a repeat 2D echo and EF is 25%. Will proceed with ICD generator change out.  Mikle Bosworth.D.

## 2014-09-10 NOTE — H&P (Signed)
  ICD Criteria  Current LVEF:25% ;Obtained < 1 month ago.  NYHA Functional Classification: Class II  Heart Failure History:  Yes, Duration of heart failure since onset is > 9 months  Non-Ischemic Dilated Cardiomyopathy History:  Yes, timeframe is > 9 months  Atrial Fibrillation/Atrial Flutter:  No.  Ventricular Tachycardia History:  No.  Cardiac Arrest History:  No  History of Syndromes with Risk of Sudden Death:  No.  Previous ICD:  Yes, ICD Type:  Single, Reason for ICD:  Primary prevention.  25%  Electrophysiology Study: No.  Prior MI: No.  PPM: No.  OSA:  No  Patient Life Expectancy of >=1 year: Yes.  Anticoagulation Therapy:  Patient is NOT on anticoagulation therapy.   Beta Blocker Therapy:  Yes.   Ace Inhibitor/ARB Therapy:  Yes.

## 2014-09-10 NOTE — Discharge Instructions (Signed)
Pacemaker Battery Change, Care After °Refer to this sheet in the next few weeks. These instructions provide you with information on caring for yourself after your procedure. Your health care provider may also give you more specific instructions. Your treatment has been planned according to current medical practices, but problems sometimes occur. Call your health care provider if you have any problems or questions after your procedure. °WHAT TO EXPECT AFTER THE PROCEDURE °After your procedure, it is typical to have the following sensations: °· Soreness at the pacemaker site. °HOME CARE INSTRUCTIONS  °· Keep the incision clean and dry. °· Unless advised otherwise, you may shower beginning 48 hours after your procedure. °· For the first week after the replacement, avoid stretching motions that pull at the incision site, and avoid heavy exercise with the arm that is on the same side as the incision. °· Take medicines only as directed by your health care provider. °· Keep all follow-up visits as directed by your health care provider. °SEEK MEDICAL CARE IF:  °· You have pain at the incision site that is not relieved by over-the-counter or prescription medicine. °· There is drainage or pus from the incision site. °· There is swelling larger than a lime at the incision site. °· You develop red streaking that extends above or below the incision site. °· You feel brief, intermittent palpitations, light-headedness, or any symptoms that you feel might be related to your heart. °SEEK IMMEDIATE MEDICAL CARE IF:  °· You experience chest pain that is different than the pain at the pacemaker site. °· You experience shortness of breath. °· You have palpitations or irregular heartbeat. °· You have light-headedness that does not go away quickly. °· You faint. °· You have pain that gets worse and is not relieved by medicine. °Document Released: 05/03/2013 Document Revised: 11/27/2013 Document Reviewed: 05/03/2013 °ExitCare® Patient  Information ©2015 [employer]. This information is not intended to replace advice given to you by your health care provider. Make sure you discuss any questions you have with your health care provider. ° °

## 2014-09-12 ENCOUNTER — Telehealth: Payer: Self-pay | Admitting: Internal Medicine

## 2014-09-12 ENCOUNTER — Encounter: Payer: Self-pay | Admitting: *Deleted

## 2014-09-12 NOTE — Telephone Encounter (Signed)
New Message      Pt calling stating that she needs a return to work note. Please fax to [phone]. Call pt if you have any questions.

## 2014-09-12 NOTE — Telephone Encounter (Signed)
Spoke with patient and letter sent.

## 2014-09-20 ENCOUNTER — Encounter: Payer: Self-pay | Admitting: *Deleted

## 2014-09-20 ENCOUNTER — Ambulatory Visit (INDEPENDENT_AMBULATORY_CARE_PROVIDER_SITE_OTHER): Payer: No Typology Code available for payment source | Admitting: *Deleted

## 2014-09-20 DIAGNOSIS — I5020 Unspecified systolic (congestive) heart failure: Secondary | ICD-10-CM

## 2014-09-20 DIAGNOSIS — I119 Hypertensive heart disease without heart failure: Secondary | ICD-10-CM

## 2014-09-20 DIAGNOSIS — I110 Hypertensive heart disease with heart failure: Secondary | ICD-10-CM

## 2014-09-20 DIAGNOSIS — I429 Cardiomyopathy, unspecified: Secondary | ICD-10-CM

## 2014-09-20 DIAGNOSIS — I43 Cardiomyopathy in diseases classified elsewhere: Principal | ICD-10-CM

## 2014-09-20 LAB — MDC_IDC_ENUM_SESS_TYPE_INCLINIC
Battery Remaining Longevity: 138 mo
HIGH POWER IMPEDANCE MEASURED VALUE: 53 Ohm
HighPow Impedance: 171 Ohm
HighPow Impedance: 43 Ohm
Lead Channel Pacing Threshold Pulse Width: 0.7 ms
Lead Channel Sensing Intrinsic Amplitude: 31.625 mV
Lead Channel Setting Pacing Amplitude: 2.5 V
Lead Channel Setting Pacing Pulse Width: 0.7 ms
MDC IDC MSMT BATTERY VOLTAGE: 3.15 V
MDC IDC MSMT LEADCHNL RV IMPEDANCE VALUE: 399 Ohm
MDC IDC MSMT LEADCHNL RV PACING THRESHOLD AMPLITUDE: 1.25 V
MDC IDC MSMT LEADCHNL RV SENSING INTR AMPL: 31.625 mV
MDC IDC SESS DTM: 20160225162033
MDC IDC SET LEADCHNL RV SENSING SENSITIVITY: 0.3 mV
MDC IDC STAT BRADY RV PERCENT PACED: 0.01 %
Zone Setting Detection Interval: 250 ms
Zone Setting Detection Interval: 330 ms
Zone Setting Detection Interval: 450 ms

## 2014-09-20 NOTE — Progress Notes (Signed)
Wound check appointment. Steri-strips removed. Wound without redness or edema. Incision edges approximated, wound well healed. Normal device function. Thresholds, sensing, and impedances consistent with implant measurements.  Histogram distribution appropriate for patient and level of activity. No mode switches or ventricular arrhythmias noted. Patient educated about wound care, arm mobility, lifting restrictions, shock plan. ROV in 3 months with implanting physician. 

## 2014-10-02 ENCOUNTER — Encounter: Payer: Self-pay | Admitting: Internal Medicine

## 2014-11-09 ENCOUNTER — Other Ambulatory Visit (HOSPITAL_COMMUNITY): Payer: Self-pay | Admitting: Cardiology

## 2014-11-09 ENCOUNTER — Other Ambulatory Visit (HOSPITAL_COMMUNITY): Payer: Self-pay | Admitting: Internal Medicine

## 2014-12-11 ENCOUNTER — Other Ambulatory Visit (HOSPITAL_COMMUNITY): Payer: Self-pay | Admitting: Internal Medicine

## 2014-12-11 ENCOUNTER — Other Ambulatory Visit (HOSPITAL_COMMUNITY): Payer: Self-pay | Admitting: Anesthesiology

## 2014-12-18 ENCOUNTER — Other Ambulatory Visit: Payer: Self-pay

## 2014-12-20 ENCOUNTER — Encounter: Payer: Self-pay | Admitting: *Deleted

## 2014-12-20 ENCOUNTER — Ambulatory Visit (INDEPENDENT_AMBULATORY_CARE_PROVIDER_SITE_OTHER): Payer: No Typology Code available for payment source | Admitting: Internal Medicine

## 2014-12-20 ENCOUNTER — Encounter: Payer: Self-pay | Admitting: Internal Medicine

## 2014-12-20 VITALS — BP 130/82 | HR 85 | Ht 61.5 in | Wt 255.2 lb

## 2014-12-20 DIAGNOSIS — I4729 Other ventricular tachycardia: Secondary | ICD-10-CM

## 2014-12-20 DIAGNOSIS — I5022 Chronic systolic (congestive) heart failure: Secondary | ICD-10-CM | POA: Diagnosis not present

## 2014-12-20 DIAGNOSIS — Z95810 Presence of automatic (implantable) cardiac defibrillator: Secondary | ICD-10-CM

## 2014-12-20 DIAGNOSIS — I472 Ventricular tachycardia: Secondary | ICD-10-CM

## 2014-12-20 LAB — CUP PACEART INCLINIC DEVICE CHECK
Battery Voltage: 3.14 V
HIGH POWER IMPEDANCE MEASURED VALUE: 171 Ohm
HIGH POWER IMPEDANCE MEASURED VALUE: 45 Ohm
HighPow Impedance: 51 Ohm
Lead Channel Pacing Threshold Amplitude: 1.25 V
Lead Channel Pacing Threshold Pulse Width: 0.7 ms
Lead Channel Sensing Intrinsic Amplitude: 30.25 mV
Lead Channel Setting Pacing Pulse Width: 0.7 ms
Lead Channel Setting Sensing Sensitivity: 0.3 mV
MDC IDC MSMT BATTERY REMAINING LONGEVITY: 136 mo
MDC IDC MSMT LEADCHNL RV IMPEDANCE VALUE: 399 Ohm
MDC IDC SESS DTM: 20160526173459
MDC IDC SET LEADCHNL RV PACING AMPLITUDE: 2.5 V
MDC IDC SET ZONE DETECTION INTERVAL: 330 ms
MDC IDC SET ZONE DETECTION INTERVAL: 450 ms
MDC IDC STAT BRADY RV PERCENT PACED: 0.01 %
Zone Setting Detection Interval: 250 ms

## 2014-12-20 NOTE — Progress Notes (Signed)
HPI [name] (previously [employer]) returns today for followup. She is a very pleasant 39 yo woman with a h/o a non-ischemic CM, chronic congestive heart failure, s/p ICD implant. She has undergone ICD gen change as she had persistent LV dysfunction. She has not had syncope or ICD shock. No edema. She has multiple non-cardiac complaints today include a pressure sensation behind her right knee, abdominal cramps, and abdominal destination.  Allergies  Allergen Reactions  . Percocet [Oxycodone-Acetaminophen] Shortness Of Breath     Current Outpatient Prescriptions  Medication Sig Dispense Refill  . albuterol (PROVENTIL HFA;VENTOLIN HFA) 108 (90 BASE) MCG/ACT inhaler Inhale 2 puffs into the lungs every 6 (six) hours as needed for wheezing or shortness of breath. Rescue inhaler    . carvedilol (COREG) 25 MG tablet Take 1 tablet (25 mg total) by mouth 2 (two) times daily with a meal. 60 tablet 6  . cetirizine (ZYRTEC) 10 MG tablet Take 10 mg by mouth daily as needed for allergies or rhinitis.     . hydrALAZINE (APRESOLINE) 50 MG tablet Take 1.5 tablets (75 mg total) by mouth 3 (three) times daily. 135 tablet 3  . isosorbide mononitrate (IMDUR) 60 MG 24 hr tablet Take 1 tablet (60 mg total) by mouth daily. 30 tablet 3  . losartan (COZAAR) 50 MG tablet TAKE 1 TABLET BY MOUTH DAILY. 30 tablet 3  . potassium chloride SA (K-DUR,KLOR-CON) 20 MEQ tablet TAKE 1 TABLET BY MOUTH 2 TIMES DAILY. 60 tablet 3  . ranitidine (ZANTAC) 150 MG tablet Take 150 mg by mouth daily.    Marland Kitchen spironolactone (ALDACTONE) 25 MG tablet TAKE 1/2 TABLET BY MOUTH DAILY. 15 tablet 3  . torsemide (DEMADEX) 20 MG tablet Take 3 tabs in AM and 1 tab in PM (Patient taking differently: Take 3 tablets by mouth in AM and 1 tablet by mouth in PM) 120 tablet 5   No current facility-administered medications for this visit.     Past Medical History  Diagnosis Date  . Hypertension     currently on Labetalol 200mg  BID  . Infection      UTI;not frequent;last infection was in June  . Infection     Yeast;not frequent  . Asthma     Inhaler prn;triggered by seasonal changes mostly cold weathert;last asthma  attack years ago  . CHF (congestive heart failure) 01/2006    Has Pacemaker and Defibrillator;was on heart meds;no longer taking since pregancy  . Anemia     Iron supplements in the past  . Migraines     Can't take Procardia XL, causes migraines  . Varicose veins 1998    Developed during last pregnancy  . Other primary cardiomyopathies   . Atrial fibrillation   . Paroxysmal ventricular tachycardia   . CHF (congestive heart failure)   . Hypertension   . Acute bronchitis and bronchiolitis     ROS:   All systems reviewed and negative except as noted in the HPI.   Past Surgical History  Procedure Laterality Date  . Cesarean section  1998    d/t preeclampsia  . Breast reduction surgery  03/05/1994    d/t back, shoulder, and neck pain (42 F)  . Insert / replace / remove pacemaker   02/10/2006    Status post implantable cardioverter-defibrillator insertion  .Marland KitchenMarland KitchenMarland KitchenThe Medtronic Maximo VR, model 7232, single chamber  . Cardiac catheterization      EF 30-35%  . Cardiac defibrillator placement    . Implantable cardioverter defibrillator (  icd) generator change N/A 09/10/2014    Procedure: ICD GENERATOR CHANGE;  Surgeon: Evans Lance, MD;  Location: [facility] C[facility] LAB;  Service: Cardiovascular;  Laterality: N/A;     Family History  Problem Relation Age of Onset  . Sickle cell trait Daughter   . Other Father     Varicose veins  . Asthma Daughter   . Seizures Maternal Uncle   . Migraines Mother   . Migraines Daughter   . Migraines Cousin     Maternal  . Cancer Paternal Grandmother     Stomach  . Cancer Maternal Uncle     Prostate  . Cancer Maternal Uncle     Lung  . Ulcers Mother   . Lupus Maternal Aunt   . Hypertension Father   . Hypertension Paternal Aunt   . Other Daughter     blood transfusion @  birth;platelets were low  . Rheum arthritis Mother   . Diabetes Father     Controlled w/ diet and exercise  . Diabetes Maternal Aunt   . Diabetes Paternal Aunt     x 2  . Diabetes Paternal Uncle   . Hypertension    . Heart disease       History   Social History  . Marital Status: Married    Spouse Name: [name]  . Number of Children: 1  . Years of Education: 15   Occupational History  . 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 [place] History Main Topics  . Smoking status: Never Smoker   . Smokeless tobacco: Never Used  . Alcohol Use: No  . Drug Use: No  . Sexual Activity:    Partners: Male    Birth Control/ Protection: Condom, Pill, None   Other Topics Concern  . Not on file   Social History Narrative   ** Merged History Encounter **         BP 130/82 mmHg  Pulse 85  Ht 5' 1.5" (1.562 m)  Wt 255 lb 3.2 oz (115.758 kg)  BMI 47.44 kg/m2  Physical Exam:  Well appearing 39 yo woman, NAD HEENT: Unremarkable Neck:  No JVD, no thyromegally Lymphatics:  No adenopathy Back:  No CVA tenderness Lungs:  Clear with no wheezes ,rales, or rhonchi HEART:  Regular rate rhythm, no murmurs, no rubs, no clicks Abd:  soft, positive bowel sounds, no organomegally, no rebound, no guarding Ext:  2 plus pulses, no edema, no cyanosis, no clubbing Skin:  No rashes no nodules Neuro:  CN II through XII intact, motor grossly intact   DEVICE  Normal device function.  See PaceArt for details.   Assess/Plan:

## 2014-12-20 NOTE — Patient Instructions (Signed)
Medication Instructions:  Your physician recommends that you continue on your current medications as directed. Please refer to the Current Medication list given to you today.   Labwork: None ordered  Testing/Procedures: None ordered  Follow-Up: Your physician wants you to follow-up in: 9 months with Dr Knox Saliva will receive a reminder letter in the mail two months in advance. If you don't receive a letter, please call our office to schedule the follow-up appointment.  Remote monitoring is used to monitor your Pacemaker or ICD from home. This monitoring reduces the number of office visits required to check your device to one time per year. It allows [place] to keep an eye on the functioning of your device to ensure it is working properly. You are scheduled for a device check from home on 03/21/15. You may send your transmission at any time that day. If you have a wireless device, the transmission will be sent automatically. After your physician reviews your transmission, you will receive a postcard with your next transmission date.     Any Other Special Instructions Will Be Listed Below (If Applicable).

## 2014-12-20 NOTE — Assessment & Plan Note (Signed)
Her symptoms are class 2. She will continue her current meds. She is encouraged but is not following a low sodium diet.

## 2014-12-20 NOTE — Assessment & Plan Note (Signed)
Her medtronic device is working normally. Will recheck in several months.  

## 2014-12-20 NOTE — Assessment & Plan Note (Signed)
Since her ICD gen change, she has had no additional VT. Will follow. No change in meds.

## 2014-12-26 ENCOUNTER — Encounter: Payer: Self-pay | Admitting: Internal Medicine

## 2014-12-31 ENCOUNTER — Telehealth (HOSPITAL_COMMUNITY): Payer: Self-pay | Admitting: Vascular Surgery

## 2015-01-10 NOTE — Telephone Encounter (Signed)
Left pt message for appt

## 2015-01-11 ENCOUNTER — Other Ambulatory Visit (HOSPITAL_COMMUNITY): Payer: Self-pay | Admitting: Cardiology

## 2015-01-11 ENCOUNTER — Other Ambulatory Visit (HOSPITAL_COMMUNITY): Payer: Self-pay | Admitting: Anesthesiology

## 2015-01-11 ENCOUNTER — Other Ambulatory Visit (HOSPITAL_COMMUNITY): Payer: Self-pay | Admitting: Internal Medicine

## 2015-01-16 ENCOUNTER — Other Ambulatory Visit (HOSPITAL_COMMUNITY): Payer: Self-pay | Admitting: Cardiology

## 2015-01-16 DIAGNOSIS — I5022 Chronic systolic (congestive) heart failure: Secondary | ICD-10-CM

## 2015-01-16 MED ORDER — POTASSIUM CHLORIDE CRYS ER 20 MEQ PO TBCR: EXTENDED_RELEASE_TABLET | ORAL | Status: DC

## 2015-01-16 MED ORDER — ISOSORBIDE MONONITRATE ER 60 MG PO TB24: 60.0000 mg | ORAL_TABLET | Freq: Every day | ORAL | Status: DC

## 2015-01-16 NOTE — Telephone Encounter (Signed)
Pt called to request refills of meds

## 2015-01-21 ENCOUNTER — Ambulatory Visit (HOSPITAL_COMMUNITY)
Admission: RE | Admit: 2015-01-21 | Discharge: 2015-01-21 | Disposition: A | Discharge status: Home / Self Care | Payer: BLUE CROSS/BLUE SHIELD | Source: Ambulatory Visit | Attending: Internal Medicine | Admitting: Internal Medicine

## 2015-01-21 VITALS — BP 130/98 | HR 79 | Wt 257.4 lb

## 2015-01-21 DIAGNOSIS — G4733 Obstructive sleep apnea (adult) (pediatric): Secondary | ICD-10-CM | POA: Insufficient documentation

## 2015-01-21 DIAGNOSIS — Z833 Family history of diabetes mellitus: Secondary | ICD-10-CM | POA: Diagnosis not present

## 2015-01-21 DIAGNOSIS — Z8249 Family history of ischemic heart disease and other diseases of the circulatory system: Secondary | ICD-10-CM | POA: Insufficient documentation

## 2015-01-21 DIAGNOSIS — E669 Obesity, unspecified: Secondary | ICD-10-CM | POA: Insufficient documentation

## 2015-01-21 DIAGNOSIS — J45909 Unspecified asthma, uncomplicated: Secondary | ICD-10-CM | POA: Diagnosis not present

## 2015-01-21 DIAGNOSIS — I10 Essential (primary) hypertension: Secondary | ICD-10-CM | POA: Insufficient documentation

## 2015-01-21 DIAGNOSIS — I5022 Chronic systolic (congestive) heart failure: Secondary | ICD-10-CM | POA: Insufficient documentation

## 2015-01-21 DIAGNOSIS — I429 Cardiomyopathy, unspecified: Secondary | ICD-10-CM | POA: Insufficient documentation

## 2015-01-21 DIAGNOSIS — Z95810 Presence of automatic (implantable) cardiac defibrillator: Secondary | ICD-10-CM | POA: Diagnosis not present

## 2015-01-21 DIAGNOSIS — Z79899 Other long term (current) drug therapy: Secondary | ICD-10-CM | POA: Insufficient documentation

## 2015-01-21 DIAGNOSIS — I4891 Unspecified atrial fibrillation: Secondary | ICD-10-CM | POA: Diagnosis present

## 2015-01-21 LAB — BASIC METABOLIC PANEL
Anion gap: 6 (ref 5–15)
BUN: 13 mg/dL (ref 6–20)
CHLORIDE: 103 mmol/L (ref 101–111)
CO2: 32 mmol/L (ref 22–32)
Calcium: 8.7 mg/dL — ABNORMAL LOW (ref 8.9–10.3)
Creatinine, Ser: 1.18 mg/dL — ABNORMAL HIGH (ref 0.44–1.00)
GFR calc Af Amer: >60 mL/min (ref >60)
GFR, EST NON AFRICAN AMERICAN: 57 mL/min — AB (ref >60)
GLUCOSE: 123 mg/dL — AB (ref 65–99)
POTASSIUM: 4.1 mmol/L (ref 3.5–5.1)
SODIUM: 141 mmol/L (ref 135–145)

## 2015-01-21 MED ORDER — SACUBITRIL-VALSARTAN 49-51 MG PO TABS: 1.0000 | ORAL_TABLET | Freq: Two times a day (BID) | ORAL | Status: DC

## 2015-01-21 NOTE — Progress Notes (Signed)
Patient ID: [name], female   DOB: 1976-01-29, 39 y.o.   MRN: [id number]  PCP: Dr. Heath Gold  OB:  Dr. Leo Grosser  EP: Dr. Lovena Le  HPI: [name] is a 39 year old woman with h/o obesity, chronic systolic heart failure due to nonischemic cardiomyopathy (cath 2007. No CAD. previously recovered, but now 25-30%, 07/2014) s/p ICD implantation, asthma/chronic bronchitis and severe hypertension.   ECHOs 04/27/2011 45-50%.   08/20/11 ECHO 55-60%   05/19/12 EF ~40%   07/01/12 EF ~40%   08/02/12 EF~40%    2/14 EF ~50%                          5/14 EF ~35%                          1/16 EF 25-30%                          She delievered her baby on September 25, 2012 without complication. Admitted 5/18 -12/14/12 with hypertensive crisis and decompensated systolic heart failure. Treated with BIPAP and IV lasix. Discharge weight 222lbs. Echo showed EF down from 50->35%. Cr bumped to 1.55 but was 1.18 at d/c.  CPX 01/2014: Resting HR 75, Peak HR 121 Peak VO2 15.2, 74.4% predicted VE/VCO2 slope: 26.4 OUES: 1.93 Peak RER 1.15 VE/MVV 61.7%  Follow up for Heart Failure:  Ocerall doing ok. Says fluid is up and down.  On demadex 60/20 mg daily and feels like she pees ok, but not as much as she used to. BP up today. Has been in 130s/80s at home.  Up 14 pounds from 08/31/14.  Drinks about 60-64 fluid oz daily. Breathing ok, denies any SOB, can do a flight of steps without difficulty. No orthopnea/PND. Says she has been watching her sodium a lot, but still gaining weight. Weights at home 250s.  Has had to taken an extra demadex on some days for 3+ lbs weight gain, usually gone by next day. Lost her CPAP in a recent move and currently has no insurance.  LABS: 01/03/14: K 4.2 Cr 1.27 pBNP 1766 (down from 4070)            02/13/14: K 4.4, creatinine 1.27            05/15/14: K 4.0, Creatinine 1.36  08/29/14: K 3.5, Creatinine 1.17  ICD: Optivol- fluid up and down but no threshold crossings. No AF/VT.   SH: Married. Lives in  [place], 2 children. Working on [employer]. No ETOH or smoking  FH: Mother living: DM2, HTN, no CAD        Father living: DM2, HTN  ROS: All systems negative except as listed in HPI, PMH and Problem List.  Past Medical History  Diagnosis Date  . Hypertension     currently on Labetalol 200mg  BID  . Infection     UTI;not frequent;last infection was in June  . Infection     Yeast;not frequent  . Asthma     Inhaler prn;triggered by seasonal changes mostly cold weathert;last asthma  attack years ago  . CHF (congestive heart failure) 01/2006    Has Pacemaker and Defibrillator;was on heart meds;no longer taking since pregancy  . Anemia     Iron supplements in the past  . Migraines     Can't take Procardia XL, causes migraines  . Varicose veins 1998    Developed during  last pregnancy  . Other primary cardiomyopathies   . Atrial fibrillation   . Paroxysmal ventricular tachycardia   . CHF (congestive heart failure)   . Hypertension   . Acute bronchitis and bronchiolitis     Current Outpatient Prescriptions  Medication Sig Dispense Refill  . albuterol (PROVENTIL HFA;VENTOLIN HFA) 108 (90 BASE) MCG/ACT inhaler Inhale 2 puffs into the lungs every 6 (six) hours as needed for wheezing or shortness of breath. Rescue inhaler    . carvedilol (COREG) 25 MG tablet TAKE 1 TABLET BY MOUTH 2 TIMES DAILY WITH A MEAL. 60 tablet 6  . cetirizine (ZYRTEC) 10 MG tablet Take 10 mg by mouth daily as needed for allergies or rhinitis.     . hydrALAZINE (APRESOLINE) 50 MG tablet Take 1.5 tablets (75 mg total) by mouth 3 (three) times daily. 135 tablet 3  . isosorbide mononitrate (IMDUR) 60 MG 24 hr tablet Take 1 tablet (60 mg total) by mouth daily. 30 tablet 3  . losartan (COZAAR) 50 MG tablet TAKE 1 TABLET BY MOUTH DAILY. 30 tablet 3  . potassium chloride SA (K-DUR,KLOR-CON) 20 MEQ tablet TAKE 1 TABLET BY MOUTH 2 TIMES DAILY. 60 tablet 3  . ranitidine (ZANTAC) 150 MG tablet Take 150 mg by mouth daily.     Marland Kitchen spironolactone (ALDACTONE) 25 MG tablet TAKE 1/2 TABLET BY MOUTH DAILY. 15 tablet 3  . torsemide (DEMADEX) 20 MG tablet TAKE 3 TABS BY MOUTH EVERY MORNING AND 1 TAB EVERY EVENING 120 tablet 6   No current facility-administered medications for this encounter.    Filed Vitals:   01/21/15 0842  BP: 130/98  Pulse: 79  Weight: 257 lb 6.4 oz (116.756 kg)  SpO2: 95%    PHYSICAL EXAM: General: Well appearing; No resp difficulty.  HEENT: normal Neck: supple. JVP 6-7 ; Carotids 2+ bilaterally; no bruits. No lymphadenopathy or thryomegaly appreciated. Cor: PMI normal. Regular rate & rhythm. +s4. No murmurs appreciated Lungs: clear Abdomen: soft, nontender, mildly distended and obese. No hepatosplenomegaly. No bruits or masses. Good bowel sounds. Extremities: no cyanosis, clubbing, rash, tr edema to knees Neuro: alert & orientedx3, cranial nerves grossly intact. Moves all 4 extremities w/o difficulty. Affect pleasant.  ASSESSMENT & PLAN:  1) Chronic systolic heart failure, NICM s/p ICD likely r/t HTN, EF 35% (11/2012) - Chronic NYHA II-III symptoms. Volume status labile but relatively well controlled. Continue current regimen.  - Continue current doses of hydralazine/nitrates and Spiro. - Switch losartan to Entresto 49/51 bid for HTN and HF.   - Reinforced the need and importance of daily weights, a low sodium diet, and fluid restriction (less than 2 L a day). Instructed to call the HF clinic if weight increases more than 3 lbs overnight or 5 lbs in a week.  2) OSA  - Continues with fatigue.  - Lost CPAP machine. Will refer to Dr. Radford Pax to re-establish treatment of OSA.  3) HTN - will switch to [facility] for better coverage 4) Asthma/chronic bronchitis -f/u with Pulmonary  5) Obesity: - Overweight. Discussed the need to lose weight and increase activity. Continue to work cutting back on portions and to be as active as possible.   F/U 3 months.   [name], MD  9:15  AM  Patient seen and examined with Oda Kilts, PA-C. We discussed all aspects of the encounter. I agree with the assessment and plan as stated above.   Overall doing fairly well. NYHA II. Volume status a bit labile but relatively well controlled.  Reinforced need for daily weights and reviewed use of sliding scale diuretics. Will switch losartan to [facility] for help with HF and HTN. Refer to Dr. Radford Pax for OSA.   Quade Ramirez,MD 11:40 AM

## 2015-01-21 NOTE — Patient Instructions (Addendum)
Stop Losartan  Start Entresto 49/51 mg Twice daily   Lab today  Lab in 2 weeks  Your physician has recommended that you have a sleep study. This test records several body functions during sleep, including: brain activity, eye movement, oxygen and carbon dioxide blood levels, heart rate and rhythm, breathing rate and rhythm, the flow of air through your mouth and nose, snoring, body muscle movements, and chest and belly movement.  Your physician recommends that you schedule a follow-up appointment in: 3 months

## 2015-02-06 ENCOUNTER — Encounter: Payer: Self-pay | Admitting: Internal Medicine

## 2015-02-06 ENCOUNTER — Ambulatory Visit (HOSPITAL_COMMUNITY)
Admission: RE | Admit: 2015-02-06 | Discharge: 2015-02-06 | Disposition: A | Discharge status: Home / Self Care | Payer: BLUE CROSS/BLUE SHIELD | Source: Ambulatory Visit | Attending: Cardiology | Admitting: Cardiology

## 2015-02-06 DIAGNOSIS — I5020 Unspecified systolic (congestive) heart failure: Secondary | ICD-10-CM | POA: Diagnosis present

## 2015-02-06 DIAGNOSIS — I5022 Chronic systolic (congestive) heart failure: Secondary | ICD-10-CM

## 2015-02-06 LAB — BASIC METABOLIC PANEL
Anion gap: 8 (ref 5–15)
BUN: 16 mg/dL (ref 6–20)
CHLORIDE: 101 mmol/L (ref 101–111)
CO2: 29 mmol/L (ref 22–32)
Calcium: 8.9 mg/dL (ref 8.9–10.3)
Creatinine, Ser: 1.15 mg/dL — ABNORMAL HIGH (ref 0.44–1.00)
GFR calc non Af Amer: 59 mL/min — ABNORMAL LOW (ref >60)
GLUCOSE: 98 mg/dL (ref 65–99)
POTASSIUM: 3.8 mmol/L (ref 3.5–5.1)
SODIUM: 138 mmol/L (ref 135–145)

## 2015-02-27 ENCOUNTER — Telehealth (HOSPITAL_COMMUNITY): Payer: Self-pay | Admitting: *Deleted

## 2015-02-27 NOTE — Telephone Encounter (Signed)
Pt was enrolled and has been accepted into the [employer] [facility] to receive Entresto for free until 02/24/16

## 2015-03-21 ENCOUNTER — Ambulatory Visit (INDEPENDENT_AMBULATORY_CARE_PROVIDER_SITE_OTHER): Payer: Self-pay | Admitting: *Deleted

## 2015-03-21 ENCOUNTER — Telehealth: Payer: Self-pay | Admitting: Cardiology

## 2015-03-21 DIAGNOSIS — I472 Ventricular tachycardia: Secondary | ICD-10-CM

## 2015-03-21 DIAGNOSIS — I4729 Other ventricular tachycardia: Secondary | ICD-10-CM

## 2015-03-21 NOTE — Telephone Encounter (Signed)
Spoke with pt and reminded pt of remote transmission that is due today. Pt verbalized understanding.   

## 2015-03-22 DIAGNOSIS — I472 Ventricular tachycardia: Secondary | ICD-10-CM

## 2015-03-27 NOTE — Progress Notes (Signed)
Remote ICD transmission.   

## 2015-03-28 ENCOUNTER — Other Ambulatory Visit (HOSPITAL_COMMUNITY): Payer: Self-pay | Admitting: *Deleted

## 2015-03-28 ENCOUNTER — Other Ambulatory Visit (HOSPITAL_COMMUNITY): Payer: Self-pay | Admitting: Internal Medicine

## 2015-03-28 DIAGNOSIS — I509 Heart failure, unspecified: Secondary | ICD-10-CM

## 2015-03-28 MED ORDER — HYDRALAZINE HCL 50 MG PO TABS: 75.0000 mg | ORAL_TABLET | Freq: Three times a day (TID) | ORAL | Status: DC

## 2015-04-04 LAB — CUP PACEART REMOTE DEVICE CHECK
Brady Statistic RV Percent Paced: 0.01 %
Date Time Interrogation Session: 20160827015555
HIGH POWER IMPEDANCE MEASURED VALUE: 43 Ohm
HighPow Impedance: 53 Ohm
Lead Channel Impedance Value: 361 Ohm
Lead Channel Pacing Threshold Amplitude: 2.5 V
Lead Channel Pacing Threshold Pulse Width: 0.4 ms
Lead Channel Sensing Intrinsic Amplitude: 19.25 mV
MDC IDC MSMT BATTERY REMAINING LONGEVITY: 135 mo
MDC IDC MSMT BATTERY VOLTAGE: 3.1 V
MDC IDC MSMT LEADCHNL RV IMPEDANCE VALUE: 285 Ohm
MDC IDC SET LEADCHNL RV PACING AMPLITUDE: 2.5 V
MDC IDC SET LEADCHNL RV PACING PULSEWIDTH: 0.7 ms
MDC IDC SET LEADCHNL RV SENSING SENSITIVITY: 0.3 mV
MDC IDC SET ZONE DETECTION INTERVAL: 250 ms
MDC IDC SET ZONE DETECTION INTERVAL: 450 ms
Zone Setting Detection Interval: 330 ms

## 2015-04-10 ENCOUNTER — Encounter: Payer: Self-pay | Admitting: Cardiology

## 2015-04-10 ENCOUNTER — Encounter (HOSPITAL_BASED_OUTPATIENT_CLINIC_OR_DEPARTMENT_OTHER): Payer: Self-pay

## 2015-04-18 ENCOUNTER — Encounter: Payer: Self-pay | Admitting: Internal Medicine

## 2015-04-30 ENCOUNTER — Other Ambulatory Visit (HOSPITAL_COMMUNITY): Payer: Self-pay | Admitting: *Deleted

## 2015-04-30 MED ORDER — SPIRONOLACTONE 25 MG PO TABS: 12.5000 mg | ORAL_TABLET | Freq: Every day | ORAL | Status: DC

## 2015-05-04 ENCOUNTER — Encounter (HOSPITAL_COMMUNITY): Payer: Self-pay | Admitting: Emergency Medicine

## 2015-05-04 ENCOUNTER — Emergency Department (HOSPITAL_COMMUNITY): Payer: No Typology Code available for payment source

## 2015-05-04 ENCOUNTER — Encounter (HOSPITAL_COMMUNITY)
Admission: EM | Disposition: A | Discharge status: Home / Self Care | Payer: Self-pay | Source: Home / Self Care | Attending: Emergency Medicine

## 2015-05-04 ENCOUNTER — Observation Stay (HOSPITAL_COMMUNITY)
Admission: EM | Admit: 2015-05-04 | Discharge: 2015-05-05 | Disposition: A | Discharge status: Home / Self Care | Payer: No Typology Code available for payment source | Attending: Family Medicine | Admitting: Family Medicine

## 2015-05-04 DIAGNOSIS — I472 Ventricular tachycardia: Secondary | ICD-10-CM | POA: Diagnosis not present

## 2015-05-04 DIAGNOSIS — I5022 Chronic systolic (congestive) heart failure: Secondary | ICD-10-CM | POA: Insufficient documentation

## 2015-05-04 DIAGNOSIS — Z6841 Body Mass Index (BMI) 40.0 and over, adult: Secondary | ICD-10-CM | POA: Insufficient documentation

## 2015-05-04 DIAGNOSIS — D539 Nutritional anemia, unspecified: Secondary | ICD-10-CM | POA: Diagnosis present

## 2015-05-04 DIAGNOSIS — J45909 Unspecified asthma, uncomplicated: Secondary | ICD-10-CM | POA: Diagnosis not present

## 2015-05-04 DIAGNOSIS — I110 Hypertensive heart disease with heart failure: Secondary | ICD-10-CM | POA: Diagnosis not present

## 2015-05-04 DIAGNOSIS — I442 Atrioventricular block, complete: Secondary | ICD-10-CM | POA: Insufficient documentation

## 2015-05-04 DIAGNOSIS — E669 Obesity, unspecified: Secondary | ICD-10-CM | POA: Diagnosis not present

## 2015-05-04 DIAGNOSIS — I428 Other cardiomyopathies: Secondary | ICD-10-CM | POA: Diagnosis not present

## 2015-05-04 DIAGNOSIS — I201 Angina pectoris with documented spasm: Secondary | ICD-10-CM | POA: Diagnosis present

## 2015-05-04 DIAGNOSIS — Z950 Presence of cardiac pacemaker: Secondary | ICD-10-CM | POA: Diagnosis not present

## 2015-05-04 DIAGNOSIS — I25111 Atherosclerotic heart disease of native coronary artery with angina pectoris with documented spasm: Principal | ICD-10-CM | POA: Insufficient documentation

## 2015-05-04 DIAGNOSIS — I209 Angina pectoris, unspecified: Secondary | ICD-10-CM

## 2015-05-04 DIAGNOSIS — Z79899 Other long term (current) drug therapy: Secondary | ICD-10-CM | POA: Diagnosis not present

## 2015-05-04 DIAGNOSIS — D649 Anemia, unspecified: Secondary | ICD-10-CM | POA: Insufficient documentation

## 2015-05-04 DIAGNOSIS — R001 Bradycardia, unspecified: Secondary | ICD-10-CM

## 2015-05-04 DIAGNOSIS — R079 Chest pain, unspecified: Secondary | ICD-10-CM | POA: Diagnosis present

## 2015-05-04 HISTORY — PX: CARDIAC CATHETERIZATION: SHX172

## 2015-05-04 LAB — MAGNESIUM: MAGNESIUM: 2.5 mg/dL — AB (ref 1.7–2.4)

## 2015-05-04 LAB — CBC
HEMATOCRIT: 35.3 % — AB (ref 36.0–46.0)
Hemoglobin: 10.8 g/dL — ABNORMAL LOW (ref 12.0–15.0)
MCH: 25.2 pg — ABNORMAL LOW (ref 26.0–34.0)
MCHC: 30.6 g/dL (ref 30.0–36.0)
MCV: 82.5 fL (ref 78.0–100.0)
Platelets: 226 10*3/uL (ref 150–400)
RBC: 4.28 MIL/uL (ref 3.87–5.11)
RDW: 16.6 % — ABNORMAL HIGH (ref 11.5–15.5)
WBC: 6.2 10*3/uL (ref 4.0–10.5)

## 2015-05-04 LAB — BASIC METABOLIC PANEL
Anion gap: 8 (ref 5–15)
BUN: 20 mg/dL (ref 6–20)
CALCIUM: 9.4 mg/dL (ref 8.9–10.3)
CO2: 28 mmol/L (ref 22–32)
CREATININE: 1.16 mg/dL — AB (ref 0.44–1.00)
Chloride: 107 mmol/L (ref 101–111)
GFR calc Af Amer: >60 mL/min (ref >60)
GFR, EST NON AFRICAN AMERICAN: 58 mL/min — AB (ref >60)
Glucose, Bld: 134 mg/dL — ABNORMAL HIGH (ref 65–99)
POTASSIUM: 4.2 mmol/L (ref 3.5–5.1)
Sodium: 143 mmol/L (ref 135–145)

## 2015-05-04 LAB — MRSA PCR SCREENING: MRSA BY PCR: NEGATIVE

## 2015-05-04 LAB — I-STAT TROPONIN, ED
TROPONIN I, POC: 0.02 ng/mL (ref 0.00–0.08)
Troponin i, poc: 0.01 ng/mL (ref 0.00–0.08)

## 2015-05-04 LAB — FERRITIN: FERRITIN: 20 ng/mL (ref 11–307)

## 2015-05-04 LAB — IRON AND TIBC
Iron: 47 ug/dL (ref 28–170)
Saturation Ratios: 14 % (ref 10.4–31.8)
TIBC: 328 ug/dL (ref 250–450)
UIBC: 281 ug/dL

## 2015-05-04 LAB — TROPONIN I
TROPONIN I: 0.03 ng/mL (ref <0.031)
Troponin I: 0.04 ng/mL — ABNORMAL HIGH (ref <0.031)
Troponin I: <0.03 ng/mL (ref <0.031)

## 2015-05-04 SURGERY — LEFT HEART CATH AND CORONARY ANGIOGRAPHY
Anesthesia: LOCAL

## 2015-05-04 MED ORDER — HEPARIN (PORCINE) IN NACL 2-0.9 UNIT/ML-% IJ SOLN
INTRAMUSCULAR | Status: DC | PRN
  Administered 2015-05-04: 500 mL

## 2015-05-04 MED ORDER — LIDOCAINE HCL (PF) 1 % IJ SOLN
INTRAMUSCULAR | Status: AC
  Filled 2015-05-04: qty 30

## 2015-05-04 MED ORDER — NITROGLYCERIN IN D5W 200-5 MCG/ML-% IV SOLN
INTRAVENOUS | Status: AC
  Filled 2015-05-04: qty 250

## 2015-05-04 MED ORDER — MORPHINE SULFATE (PF) 4 MG/ML IV SOLN
2.0000 mg | Freq: Once | INTRAVENOUS | Status: AC
Start: 2015-05-04 — End: 2015-05-04
  Administered 2015-05-04: 2 mg via INTRAVENOUS
  Filled 2015-05-04: qty 1

## 2015-05-04 MED ORDER — ONDANSETRON HCL 4 MG/2ML IJ SOLN: 4.0000 mg | Freq: Four times a day (QID) | INTRAMUSCULAR | Status: DC | PRN

## 2015-05-04 MED ORDER — VERAPAMIL HCL 2.5 MG/ML IV SOLN
INTRAVENOUS | Status: AC
  Filled 2015-05-04: qty 2

## 2015-05-04 MED ORDER — ONDANSETRON HCL 4 MG/2ML IJ SOLN
4.0000 mg | Freq: Once | INTRAMUSCULAR | Status: AC
  Administered 2015-05-04: 4 mg via INTRAVENOUS
  Filled 2015-05-04: qty 2

## 2015-05-04 MED ORDER — MORPHINE SULFATE (PF) 2 MG/ML IV SOLN: 2.0000 mg | INTRAVENOUS | Status: DC | PRN

## 2015-05-04 MED ORDER — SACUBITRIL-VALSARTAN 49-51 MG PO TABS
1.0000 | ORAL_TABLET | Freq: Two times a day (BID) | ORAL | Status: DC
  Administered 2015-05-04 – 2015-05-05 (×2): 1 via ORAL
  Filled 2015-05-04 (×4): qty 1

## 2015-05-04 MED ORDER — ENOXAPARIN SODIUM 40 MG/0.4ML ~~LOC~~ SOLN
40.0000 mg | SUBCUTANEOUS | Status: DC
  Administered 2015-05-04: 40 mg via SUBCUTANEOUS
  Filled 2015-05-04: qty 0.4

## 2015-05-04 MED ORDER — NITROGLYCERIN 0.4 MG SL SUBL
0.4000 mg | SUBLINGUAL_TABLET | SUBLINGUAL | Status: DC | PRN
  Administered 2015-05-04: 0.4 mg via SUBLINGUAL

## 2015-05-04 MED ORDER — MORPHINE SULFATE (PF) 2 MG/ML IV SOLN: 1.0000 mg | Freq: Once | INTRAVENOUS | Status: DC

## 2015-05-04 MED ORDER — NITROGLYCERIN 1 MG/10 ML FOR IR/CATH LAB
INTRA_ARTERIAL | Status: DC | PRN
  Administered 2015-05-04: 200 ug

## 2015-05-04 MED ORDER — SODIUM CHLORIDE 0.9 % WEIGHT BASED INFUSION
1.0000 mL/kg/h | INTRAVENOUS | Status: AC
  Administered 2015-05-04: 1 mL/kg/h via INTRAVENOUS

## 2015-05-04 MED ORDER — SODIUM CHLORIDE 0.9 % IV SOLN: 250.0000 mL | INTRAVENOUS | Status: DC | PRN

## 2015-05-04 MED ORDER — PANTOPRAZOLE SODIUM 40 MG IV SOLR
40.0000 mg | Freq: Every day | INTRAVENOUS | Status: DC
  Administered 2015-05-04: 40 mg via INTRAVENOUS
  Filled 2015-05-04: qty 40

## 2015-05-04 MED ORDER — HEPARIN (PORCINE) IN NACL 2-0.9 UNIT/ML-% IJ SOLN
INTRAMUSCULAR | Status: AC
  Filled 2015-05-04: qty 500

## 2015-05-04 MED ORDER — HEPARIN (PORCINE) IN NACL 100-0.45 UNIT/ML-% IJ SOLN
1100.0000 [IU]/h | INTRAMUSCULAR | Status: DC
  Filled 2015-05-04: qty 250

## 2015-05-04 MED ORDER — ONDANSETRON HCL 4 MG/2ML IJ SOLN: 4.0000 mg | Freq: Three times a day (TID) | INTRAMUSCULAR | Status: DC | PRN

## 2015-05-04 MED ORDER — GI COCKTAIL ~~LOC~~
30.0000 mL | Freq: Once | ORAL | Status: AC
  Administered 2015-05-04: 30 mL via ORAL
  Filled 2015-05-04: qty 30

## 2015-05-04 MED ORDER — ACETAMINOPHEN 325 MG PO TABS
650.0000 mg | ORAL_TABLET | ORAL | Status: DC | PRN
  Administered 2015-05-04 – 2015-05-05 (×3): 650 mg via ORAL
  Filled 2015-05-04 (×3): qty 2

## 2015-05-04 MED ORDER — MIDAZOLAM HCL 2 MG/2ML IJ SOLN
INTRAMUSCULAR | Status: AC
  Filled 2015-05-04: qty 4

## 2015-05-04 MED ORDER — ALBUTEROL SULFATE (2.5 MG/3ML) 0.083% IN NEBU: 3.0000 mL | INHALATION_SOLUTION | Freq: Four times a day (QID) | RESPIRATORY_TRACT | Status: DC | PRN

## 2015-05-04 MED ORDER — SODIUM CHLORIDE 0.9 % IJ SOLN: 3.0000 mL | INTRAMUSCULAR | Status: DC | PRN

## 2015-05-04 MED ORDER — MIDAZOLAM HCL 2 MG/2ML IJ SOLN
INTRAMUSCULAR | Status: DC | PRN
  Administered 2015-05-04: 0.5 mg via INTRAVENOUS
  Administered 2015-05-04 (×2): 1 mg via INTRAVENOUS

## 2015-05-04 MED ORDER — GI COCKTAIL ~~LOC~~: 30.0000 mL | Freq: Once | ORAL | Status: DC

## 2015-05-04 MED ORDER — HEPARIN SODIUM (PORCINE) 5000 UNIT/ML IJ SOLN
4000.0000 [IU] | Freq: Once | INTRAMUSCULAR | Status: AC
  Administered 2015-05-04: 4000 [IU] via INTRAVENOUS

## 2015-05-04 MED ORDER — FENTANYL CITRATE (PF) 100 MCG/2ML IJ SOLN
INTRAMUSCULAR | Status: AC
  Filled 2015-05-04: qty 4

## 2015-05-04 MED ORDER — CARVEDILOL 25 MG PO TABS
25.0000 mg | ORAL_TABLET | Freq: Two times a day (BID) | ORAL | Status: DC
  Administered 2015-05-04 – 2015-05-05 (×3): 25 mg via ORAL
  Filled 2015-05-04 (×3): qty 1

## 2015-05-04 MED ORDER — NITROGLYCERIN IN D5W 200-5 MCG/ML-% IV SOLN
10.0000 ug/min | INTRAVENOUS | Status: DC
  Administered 2015-05-04: 5 ug/min via INTRAVENOUS
  Administered 2015-05-04: 10 ug/min via INTRAVENOUS
  Filled 2015-05-04: qty 250

## 2015-05-04 MED ORDER — HEPARIN SODIUM (PORCINE) 5000 UNIT/ML IJ SOLN
INTRAMUSCULAR | Status: AC
  Filled 2015-05-04: qty 1

## 2015-05-04 MED ORDER — VERAPAMIL HCL 2.5 MG/ML IV SOLN
INTRAVENOUS | Status: DC | PRN
  Administered 2015-05-04: 10 mL via INTRA_ARTERIAL

## 2015-05-04 MED ORDER — MORPHINE SULFATE (PF) 4 MG/ML IV SOLN
4.0000 mg | Freq: Once | INTRAVENOUS | Status: DC
Start: 2015-05-04 — End: 2015-05-04

## 2015-05-04 MED ORDER — ACETAMINOPHEN 325 MG PO TABS: 650.0000 mg | ORAL_TABLET | ORAL | Status: DC | PRN

## 2015-05-04 MED ORDER — HEPARIN (PORCINE) IN NACL 2-0.9 UNIT/ML-% IJ SOLN
INTRAMUSCULAR | Status: AC
  Filled 2015-05-04: qty 1000

## 2015-05-04 MED ORDER — HEPARIN SODIUM (PORCINE) 1000 UNIT/ML IJ SOLN
INTRAMUSCULAR | Status: AC
  Filled 2015-05-04: qty 1

## 2015-05-04 MED ORDER — NITROGLYCERIN 1 MG/10 ML FOR IR/CATH LAB
INTRA_ARTERIAL | Status: AC
  Filled 2015-05-04: qty 10

## 2015-05-04 MED ORDER — NITROGLYCERIN IN D5W 200-5 MCG/ML-% IV SOLN
INTRAVENOUS | Status: DC | PRN
  Administered 2015-05-04: 10 ug/min via INTRAVENOUS

## 2015-05-04 MED ORDER — ENOXAPARIN SODIUM 40 MG/0.4ML ~~LOC~~ SOLN: 40.0000 mg | SUBCUTANEOUS | Status: DC

## 2015-05-04 MED ORDER — SODIUM CHLORIDE 0.9 % IJ SOLN
3.0000 mL | Freq: Two times a day (BID) | INTRAMUSCULAR | Status: DC
  Administered 2015-05-04 – 2015-05-05 (×2): 3 mL via INTRAVENOUS

## 2015-05-04 MED ORDER — TORSEMIDE 20 MG PO TABS
60.0000 mg | ORAL_TABLET | Freq: Every day | ORAL | Status: DC
  Administered 2015-05-04 – 2015-05-05 (×2): 60 mg via ORAL
  Filled 2015-05-04 (×2): qty 3

## 2015-05-04 MED ORDER — PANTOPRAZOLE SODIUM 40 MG IV SOLR
40.0000 mg | INTRAVENOUS | Status: DC
Start: 2015-05-04 — End: 2015-05-04

## 2015-05-04 MED ORDER — HYDRALAZINE HCL 50 MG PO TABS
75.0000 mg | ORAL_TABLET | Freq: Three times a day (TID) | ORAL | Status: DC
  Administered 2015-05-04 – 2015-05-05 (×4): 75 mg via ORAL
  Filled 2015-05-04 (×8): qty 1

## 2015-05-04 MED ORDER — HEPARIN SODIUM (PORCINE) 1000 UNIT/ML IJ SOLN
INTRAMUSCULAR | Status: DC | PRN
  Administered 2015-05-04: 2500 [IU] via INTRAVENOUS

## 2015-05-04 MED ORDER — ISOSORBIDE MONONITRATE ER 60 MG PO TB24: 60.0000 mg | ORAL_TABLET | Freq: Every day | ORAL | Status: DC

## 2015-05-04 MED ORDER — SODIUM CHLORIDE 0.9 % IV SOLN
INTRAVENOUS | Status: DC | PRN
  Administered 2015-05-04 (×2): 10 mL/h via INTRAVENOUS

## 2015-05-04 MED ORDER — HEPARIN BOLUS VIA INFUSION: 4000.0000 [IU] | Freq: Once | INTRAVENOUS | Status: DC

## 2015-05-04 MED ORDER — POTASSIUM CHLORIDE CRYS ER 20 MEQ PO TBCR
20.0000 meq | EXTENDED_RELEASE_TABLET | Freq: Two times a day (BID) | ORAL | Status: DC
  Administered 2015-05-04 – 2015-05-05 (×2): 20 meq via ORAL
  Filled 2015-05-04 (×2): qty 1

## 2015-05-04 MED ORDER — FENTANYL CITRATE (PF) 100 MCG/2ML IJ SOLN
INTRAMUSCULAR | Status: DC | PRN
  Administered 2015-05-04 (×2): 50 ug via INTRAVENOUS

## 2015-05-04 MED ORDER — LIDOCAINE HCL (PF) 1 % IJ SOLN
INTRAMUSCULAR | Status: DC | PRN
  Administered 2015-05-04: 5 mL

## 2015-05-04 MED ORDER — SPIRONOLACTONE 25 MG PO TABS
12.5000 mg | ORAL_TABLET | Freq: Every day | ORAL | Status: DC
  Administered 2015-05-04 – 2015-05-05 (×2): 12.5 mg via ORAL
  Filled 2015-05-04 (×2): qty 1

## 2015-05-04 SURGICAL SUPPLY — 13 items
CATH INFINITI 5 FR JL3.5 (CATHETERS) ×2 IMPLANT
CATH INFINITI JR4 5F (CATHETERS) ×2 IMPLANT
CATH LAUNCHER 5F JL3 (CATHETERS) ×1 IMPLANT
CATHETER LAUNCHER 5F JL3 (CATHETERS) ×2
DEVICE RAD COMP TR BAND LRG (VASCULAR PRODUCTS) ×2 IMPLANT
GLIDESHEATH SLEND A-KIT 6F 22G (SHEATH) ×2 IMPLANT
KIT ENCORE 26 ADVANTAGE (KITS) IMPLANT
KIT HEART LEFT (KITS) ×2 IMPLANT
PACK CARDIAC CATHETERIZATION (CUSTOM PROCEDURE TRAY) ×2 IMPLANT
TRANSDUCER W/STOPCOCK (MISCELLANEOUS) ×2 IMPLANT
TUBING CIL FLEX 10 FLL-RA (TUBING) ×2 IMPLANT
WIRE HI TORQ VERSACORE-J 145CM (WIRE) ×2 IMPLANT
WIRE SAFE-T 1.5MM-J .035X260CM (WIRE) ×2 IMPLANT

## 2015-05-04 NOTE — ED Notes (Signed)
Pt stated she awoke this morning to chest pain in the center of her chest. Pt stated pain radiated down her left arm. She was also light headed, short of breath, and dizzy. Prior to EMS arrival, patient stated that her chest pain subsided. Upon EMS arrival, patient was given 325 mg of ASA. Pt arrived to [facility] ED with no complaints of chest pain. Pt with significant cardiac history.

## 2015-05-04 NOTE — ED Provider Notes (Signed)
CSN: [id number]     Arrival date & time 05/04/15  0615 History   First MD Initiated Contact with Patient 05/04/15 0654     Chief Complaint  Patient presents with  . Chest Pain     (Consider location/radiation/quality/duration/timing/severity/associated sxs/prior Treatment) HPI   Patient is a 39 year old female with history of systolic CHF, cardiomyopathy,, has a pacemaker and defibrillator, hypertension, asthma, A. fib and paroxysmal V. tach, who presents to the emergency department for evaluation of chest pain.  Patient woke up this morning chest pain with substernal, central chest pain, with radiation down her left arm, associated with lightheadedness, shortness of breath, "hot flash" and diaphoresis, worsened with laying down and slightly relieved with sitting upright.  Chest pain lasted a few minutes and spontaneously subsided. At its worst was rated 7 out of 10, described as a pressure and a burning.  The patient called 911, and her chest pain began to subside prior to the arrival of EMS. She was given 325 mg of aspirin in route to the ER and upon arrival was completely asymptomatic.   She reports compliance with all of her medication, states she weighs herself daily and has had no change in her dry weight.  She has only experienced one other episode of chest pain, which she states was similar but not as severe and occurred approximately 2 months ago.  She was not evaluated at that time and has not seen cardiology since that initial, brief chest pain episode. She reports a recent change to her AICD in February, she reports one episode of pacing which occurred while she slept, and otherwise she has not felt the defibrillator fire. She reports a history of GERD and states that his pain does not feel like it and she has had no increased issues with her reflux.  She also suffers from obstructive sleep apnea and does not currently have a machine, but has a steady scheduled November 4.  She denies  syncope, abdominal pain, orthopnea, PND, palpitations, increase in LE edema, N, V, D, cough, wheeze. Her last ECHO was January 2016, EF is 25-30%.  Pt reports a clean heart cath in 2007.   Past Medical History  Diagnosis Date  . Hypertension     currently on Labetalol 200mg  BID  . Infection     UTI;not frequent;last infection was in June  . Infection     Yeast;not frequent  . Asthma     Inhaler prn;triggered by seasonal changes mostly cold weathert;last asthma  attack years ago  . CHF (congestive heart failure) ([place]) 01/2006    Has Pacemaker and Defibrillator;was on heart meds;no longer taking since pregancy  . Anemia     Iron supplements in the past  . Migraines     Can't take Procardia XL, causes migraines  . Varicose veins 1998    Developed during last pregnancy  . Other primary cardiomyopathies   . Atrial fibrillation ([place])   . Paroxysmal ventricular tachycardia ([place])   . CHF (congestive heart failure) ([place])   . Hypertension   . Acute bronchitis and bronchiolitis    Past Surgical History  Procedure Laterality Date  . Cesarean section  1998    d/t preeclampsia  . Breast reduction surgery  03/05/1994    d/t back, shoulder, and neck pain (42 F)  . Insert / replace / remove pacemaker   02/10/2006    Status post implantable cardioverter-defibrillator insertion  .Marland KitchenMarland KitchenMarland KitchenThe Medtronic Maximo VR, model 7232, single chamber  . Cardiac catheterization  EF 30-35%  . Cardiac defibrillator placement    . Implantable cardioverter defibrillator (icd) generator change N/A 09/10/2014    Procedure: ICD GENERATOR CHANGE;  Surgeon: Evans Lance, MD;  Location: [facility] CATH LAB;  Service: Cardiovascular;  Laterality: N/A;   Family History  Problem Relation Age of Onset  . Sickle cell trait Daughter   . Other Father     Varicose veins  . Asthma Daughter   . Seizures Maternal Uncle   . Migraines Mother   . Migraines Daughter   . Migraines Cousin     Maternal  . Cancer Paternal Grandmother      Stomach  . Cancer Maternal Uncle     Prostate  . Cancer Maternal Uncle     Lung  . Ulcers Mother   . Lupus Maternal Aunt   . Hypertension Father   . Hypertension Paternal Aunt   . Other Daughter     blood transfusion @ birth;platelets were low  . Rheum arthritis Mother   . Diabetes Father     Controlled w/ diet and exercise  . Diabetes Maternal Aunt   . Diabetes Paternal Aunt     x 2  . Diabetes Paternal Uncle   . Hypertension    . Heart disease     Social History  Substance Use Topics  . Smoking status: Never Smoker   . Smokeless tobacco: Never Used  . Alcohol Use: No   OB History    Gravida Para Term Preterm AB TAB SAB Ectopic Multiple Living   2 1  1      1      Review of Systems  Constitutional: Positive for diaphoresis. Negative for unexpected weight change.  HENT: Negative.   Respiratory: Positive for apnea, cough, choking, chest tightness and shortness of breath. Negative for wheezing.   Cardiovascular: Positive for chest pain. Negative for palpitations and leg swelling.  Gastrointestinal: Positive for nausea. Negative for vomiting and diarrhea.      Allergies  Percocet  Home Medications   Prior to Admission medications   Medication Sig Start Date End Date Taking? Authorizing Provider  albuterol (PROVENTIL HFA;VENTOLIN HFA) 108 (90 BASE) MCG/ACT inhaler Inhale 2 puffs into the lungs every 6 (six) hours as needed for wheezing or shortness of breath. Rescue inhaler   Yes Historical Provider, MD  carvedilol (COREG) 25 MG tablet TAKE 1 TABLET BY MOUTH 2 TIMES DAILY WITH A MEAL. 01/11/15  Yes Jolaine Artist, MD  cetirizine (ZYRTEC) 10 MG tablet Take 10 mg by mouth daily as needed for allergies or rhinitis.    Yes Historical Provider, MD  hydrALAZINE (APRESOLINE) 50 MG tablet Take 1.5 tablets (75 mg total) by mouth 3 (three) times daily. 03/28/15  Yes Jolaine Artist, MD  isosorbide mononitrate (IMDUR) 60 MG 24 hr tablet Take 1 tablet (60 mg total) by  mouth daily. 01/16/15  Yes Shaune Pascal Bensimhon, MD  potassium chloride SA (K-DUR,KLOR-CON) 20 MEQ tablet TAKE 1 TABLET BY MOUTH 2 TIMES DAILY. 01/16/15  Yes Jolaine Artist, MD  ranitidine (ZANTAC) 150 MG tablet Take 150 mg by mouth daily.   Yes Historical Provider, MD  sacubitril-valsartan (ENTRESTO) 49-51 MG Take 1 tablet by mouth 2 (two) times daily. 01/21/15  Yes Jolaine Artist, MD  spironolactone (ALDACTONE) 25 MG tablet Take 0.5 tablets (12.5 mg total) by mouth daily. 04/30/15  Yes Shaune Pascal Bensimhon, MD  torsemide (DEMADEX) 20 MG tablet TAKE 3 TABS BY MOUTH EVERY MORNING AND 1 TAB EVERY  EVENING 01/14/15  Yes Larey Dresser, MD   BP 191/106 mmHg  Pulse 69  Temp(Src) 98.4 F (36.9 C) (Oral)  Resp 19  SpO2 100%  LMP 04/12/2015 Physical Exam  Constitutional: She is oriented to person, place, and time. She appears well-developed and well-nourished. No distress.  HENT:  Head: Normocephalic and atraumatic.  Nose: Nose normal.  Mouth/Throat: Oropharynx is clear and moist. No oropharyngeal exudate.  Eyes: Conjunctivae and EOM are normal. Pupils are equal, round, and reactive to light. Right eye exhibits no discharge. Left eye exhibits no discharge. No scleral icterus.  Neck: Normal range of motion. No JVD present. No tracheal deviation present. No thyromegaly present.  Cardiovascular: Normal rate, regular rhythm, normal heart sounds and intact distal pulses.  Exam reveals no gallop and no friction rub.   No murmur heard. Distant heart sounds, no M, G, R, RRR, 1+ pitting pretibial, right radial pulse 2+, left radial 1+, DP and PT symmetrical 2+  Pulmonary/Chest: Effort normal and breath sounds normal. No respiratory distress. She has no wheezes. She has no rales. She exhibits no tenderness.  Abdominal: Soft. Bowel sounds are normal. She exhibits no distension and no mass. There is no tenderness. There is no rebound and no guarding.  Abdomen obese, soft, non-ttp, no rebound, no guarding,  normal BS throughout  Musculoskeletal: Normal range of motion. She exhibits no edema or tenderness.  Lymphadenopathy:    She has no cervical adenopathy.  Neurological: She is alert and oriented to person, place, and time. She has normal reflexes. No cranial nerve deficit. She exhibits normal muscle tone. Coordination normal.  Skin: Skin is warm and dry. No rash noted. She is not diaphoretic. No erythema. No pallor.  Psychiatric: She has a normal mood and affect. Her behavior is normal. Judgment and thought content normal.  Nursing note and vitals reviewed.   ED Course  Procedures (including critical care time) Labs Review Labs Reviewed  CBC - Abnormal; Notable for the following:    Hemoglobin 10.8 (*)    HCT 35.3 (*)    MCH 25.2 (*)    RDW 16.6 (*)    All other components within normal limits  BASIC METABOLIC PANEL - Abnormal; Notable for the following:    Glucose, Bld 134 (*)    Creatinine, Ser 1.16 (*)    GFR calc non Af Amer 58 (*)    All other components within normal limits  MAGNESIUM - Abnormal; Notable for the following:    Magnesium 2.5 (*)    All other components within normal limits  I-STAT TROPOININ, ED    Imaging Review Dg Chest 2 View  05/04/2015   CLINICAL DATA:  Chest pain and shortness of breath.  EXAM: CHEST  2 VIEW  COMPARISON:  12/07/2013  FINDINGS: Chronic slight cardiomegaly. Pulmonary vascularity is normal and the lungs are clear. No osseous abnormality. AICD in place.  IMPRESSION: No acute abnormality.  Chronic cardiomegaly.   Electronically Signed   By: Lorriane Shire M.D.   On: 05/04/2015 08:32   I have personally reviewed and evaluated these images and lab results as part of my medical decision-making.   EKG Interpretation None      MDM   Final diagnoses:  None    Pt with sudden onset central CP with near syncope and L arm numbness/tingling, spontaneous resolved.  Initial evaluation, patient was asymptomatic, chest pain workup initiated, the  charge nurse was asked to interrogate her Medtronic device.  Initial EKG sinus rhythm, first-degree  heart block, TWI in I and aVL, no ST elevation, initial troponin was negative, labs otherwise reveal normocytic anemia, mildly elevated sCr.  8:00 am- Pt experienced CP associated with N, diaphoresis, lasted approximately 5 min, resolved spontaneously.  I reviewed cardiac monitoring and did not see any ST elevation or arrhythmias.  Approximately 8:20 pt returned from XR and again experienced sudden onset chest pain, described as severe squeezing and pressure, pt was diaphoretic, SOB, N.  Monitor showed her HR to be in the 30's to 40's.  Dr. Audie Pinto was called to the bedside and cardiology was called.  Cardiac monitoring showed she was paced in the 40's and her HR quickly returned to the 70's with CP resolved.  The pt was seen by Dr. Caryl Comes, pt was to be admitted for CP obs.  10:10 pt again had recurrence of severe CP - repeat EKG obtained, no changes from initial, on bedside monitoring, pt was distressed, SOB, diaphoretic, HR was 68, BP 191/106.    SL NG ordered.  CP began to decrease after NG, after roughly 6 minutes she had no more chest pain.  10:30 am Repeat I-stat troponin was ordered (approximately 4 hours after initial troponin).  Cardiology was repaged to update them re: recurring CP without bradycardia.  Repeat troponin negative.  ~12:30  Patient experienced recurrence of her chest pain, described more as a squeezing and pressure, repeat EKG revealed inferior ST elevations and deepening of T-wave inversions in lead 1 and aVL.  Patient was placed on portable monitor with pads applied, she was bradycardic in the 30s, diaphoretic.  CP lasted ~15 minutes.  Per Dr. Audie Pinto, heparin ordered 4,000 units given after placement of a 2nd IV, heparin drip ordered, waiting for it to arrive.  Dr. Audie Pinto has seen the pt and called [facility] Dr. Tamala Julian with [facility] evaluated pt.  He will take the pt to cath  lab.  CRITICAL CARE Performed by: Delsa Grana Total critical care time: 120 Critical care time was exclusive of separately billable procedures and treating other patients. Critical care was necessary to treat or prevent imminent or life-threatening deterioration. Critical care was time spent personally by me on the following activities: development of treatment plan with patient and/or surrogate as well as nursing, discussions with consultants, evaluation of patient's response to treatment, examination of patient, obtaining history from patient or surrogate, ordering and performing treatments and interventions, ordering and review of laboratory studies, ordering and review of radiographic studies, pulse oximetry and re-evaluation of patient's condition.        Delsa Grana, PA-C 05/06/15 1007  Leonard Schwartz, MD 05/10/15 [phone]

## 2015-05-04 NOTE — H&P (Signed)
[facility] History and Physical  BUFFEY ZABINSKI XFG:[id number] DOB: May 28, 1976 DOA: 05/04/2015  Referring physician: Dr. Audie Pinto PCP: No primary care provider on file.   Chief Complaint: chest pain  HPI: [name] is a 39 y.o. female  With PMHx of CHF, HTN, parox VT.  She comes in today as she awoke this AM with chest pain-- described as a spasm.  Pressure in the center of her chest and down her right arm.  She described lightheadedness and SOB with this episode, as well as "hot flashes".  She had an episode like this previously a few months ago but sought no treatment Patient was getting up to go to the bathroom when episode occurred, not in relation of eating or drinking   In the ER, it was found her HR dropped into the 40s during these episodes.  She also reports abdominal muscle cramping since her 2nd C-section 2 years ago, she has seen several drs but has found no explanation.  In the ER, she was senn by Dr. Caryl Comes who recommended medical admission to telemetry with cycling of CE.  Dropping of her HR was thought to be a vagal response to the pain.    Review of Systems:  All systems negative unless stated above    Past Medical History  Diagnosis Date  . Hypertension     currently on Labetalol 200mg  BID  . Infection     UTI;not frequent;last infection was in June  . Infection     Yeast;not frequent  . Asthma     Inhaler prn;triggered by seasonal changes mostly cold weathert;last asthma  attack years ago  . CHF (congestive heart failure) ([place]) 01/2006    Has Pacemaker and Defibrillator;was on heart meds;no longer taking since pregancy  . Anemia     Iron supplements in the past  . Migraines     Can't take Procardia XL, causes migraines  . Varicose veins 1998    Developed during last pregnancy  . Other primary cardiomyopathies   . Atrial fibrillation ([place])   . Paroxysmal ventricular tachycardia ([place])   . CHF (congestive heart failure) ([place])   . Hypertension   .  Acute bronchitis and bronchiolitis    Past Surgical History  Procedure Laterality Date  . Cesarean section  1998    d/t preeclampsia  . Breast reduction surgery  03/05/1994    d/t back, shoulder, and neck pain (42 F)  . Insert / replace / remove pacemaker   02/10/2006    Status post implantable cardioverter-defibrillator insertion  .Marland KitchenMarland KitchenMarland KitchenThe Medtronic Maximo VR, model 7232, single chamber  . Cardiac catheterization      EF 30-35%  . Cardiac defibrillator placement    . Implantable cardioverter defibrillator (icd) generator change N/A 09/10/2014    Procedure: ICD GENERATOR CHANGE;  Surgeon: Evans Lance, MD;  Location: [facility] CATH LAB;  Service: Cardiovascular;  Laterality: N/A;   Social History:  reports that she has never smoked. She has never used smokeless tobacco. She reports that she does not drink alcohol or use illicit drugs.  Allergies  Allergen Reactions  . Percocet [Oxycodone-Acetaminophen] Shortness Of Breath    Family History  Problem Relation Age of Onset  . Sickle cell trait Daughter   . Other Father     Varicose veins  . Asthma Daughter   . Seizures Maternal Uncle   . Migraines Mother   . Migraines Daughter   . Migraines Cousin     Maternal  . Cancer Paternal  Grandmother     Stomach  . Cancer Maternal Uncle     Prostate  . Cancer Maternal Uncle     Lung  . Ulcers Mother   . Lupus Maternal Aunt   . Hypertension Father   . Hypertension Paternal Aunt   . Other Daughter     blood transfusion @ birth;platelets were low  . Rheum arthritis Mother   . Diabetes Father     Controlled w/ diet and exercise  . Diabetes Maternal Aunt   . Diabetes Paternal Aunt     x 2  . Diabetes Paternal Uncle   . Hypertension    . Heart disease      Prior to Admission medications   Medication Sig Start Date End Date Taking? Authorizing Provider  albuterol (PROVENTIL HFA;VENTOLIN HFA) 108 (90 BASE) MCG/ACT inhaler Inhale 2 puffs into the lungs every 6 (six) hours as needed for  wheezing or shortness of breath. Rescue inhaler   Yes Historical Provider, MD  carvedilol (COREG) 25 MG tablet TAKE 1 TABLET BY MOUTH 2 TIMES DAILY WITH A MEAL. 01/11/15  Yes Jolaine Artist, MD  cetirizine (ZYRTEC) 10 MG tablet Take 10 mg by mouth daily as needed for allergies or rhinitis.    Yes Historical Provider, MD  hydrALAZINE (APRESOLINE) 50 MG tablet Take 1.5 tablets (75 mg total) by mouth 3 (three) times daily. 03/28/15  Yes Jolaine Artist, MD  isosorbide mononitrate (IMDUR) 60 MG 24 hr tablet Take 1 tablet (60 mg total) by mouth daily. 01/16/15  Yes Shaune Pascal Bensimhon, MD  potassium chloride SA (K-DUR,KLOR-CON) 20 MEQ tablet TAKE 1 TABLET BY MOUTH 2 TIMES DAILY. 01/16/15  Yes Jolaine Artist, MD  ranitidine (ZANTAC) 150 MG tablet Take 150 mg by mouth daily.   Yes Historical Provider, MD  sacubitril-valsartan (ENTRESTO) 49-51 MG Take 1 tablet by mouth 2 (two) times daily. 01/21/15  Yes Jolaine Artist, MD  spironolactone (ALDACTONE) 25 MG tablet Take 0.5 tablets (12.5 mg total) by mouth daily. 04/30/15  Yes Jolaine Artist, MD  torsemide (DEMADEX) 20 MG tablet TAKE 3 TABS BY MOUTH EVERY MORNING AND 1 TAB EVERY EVENING 01/14/15  Yes Larey Dresser, MD   Physical Exam: Filed Vitals:   05/04/15 1130 05/04/15 1200 05/04/15 1201 05/04/15 1215  BP: 153/74 143/74 148/75 143/82  Pulse: 66 64 115 63  Temp:      TempSrc:      Resp: 13 6 24 13   SpO2: 100% 100% 99% 100%    Wt Readings from Last 3 Encounters:  01/21/15 116.756 kg (257 lb 6.4 oz)  12/20/14 115.758 kg (255 lb 3.2 oz)  09/10/14 110.224 kg (243 lb)    General:  Appears calm and comfortable-- started developing CP during exam and began to sweat Eyes: PERRL, normal lids, irises & conjunctiva ENT: grossly normal hearing, lips & tongue Neck: no LAD, masses or thyromegaly Cardiovascular: RRR, no m/r/g. No LE edema. Telemetry: SR, no arrhythmias  Respiratory: CTA bilaterally, no w/r/r. Normal respiratory  effort. Abdomen: soft, ntnd, obese Skin: no rash or induration seen on limited exam Musculoskeletal: grossly normal tone BUE/BLE Psychiatric: grossly normal mood and affect, speech fluent and appropriate Neurologic: grossly non-focal.          Labs on Admission:  Basic Metabolic Panel:  Recent Labs Lab 05/04/15 0700  NA 143  K 4.2  CL 107  CO2 28  GLUCOSE 134*  BUN 20  CREATININE 1.16*  CALCIUM 9.4  MG 2.5*  Liver Function Tests: No results for input(s): AST, ALT, ALKPHOS, BILITOT, PROT, ALBUMIN in the last 168 hours. No results for input(s): LIPASE, AMYLASE in the last 168 hours. No results for input(s): AMMONIA in the last 168 hours. CBC:  Recent Labs Lab 05/04/15 0700  WBC 6.2  HGB 10.8*  HCT 35.3*  MCV 82.5  PLT 226   Cardiac Enzymes: No results for input(s): CKTOTAL, CKMB, CKMBINDEX, TROPONINI in the last 168 hours.  BNP (last 3 results) No results for input(s): BNP in the last 8760 hours.  ProBNP (last 3 results) No results for input(s): PROBNP in the last 8760 hours.  CBG: No results for input(s): GLUCAP in the last 168 hours.  Radiological Exams on Admission: Dg Chest 2 View  05/04/2015   CLINICAL DATA:  Chest pain and shortness of breath.  EXAM: CHEST  2 VIEW  COMPARISON:  12/07/2013  FINDINGS: Chronic slight cardiomegaly. Pulmonary vascularity is normal and the lungs are clear. No osseous abnormality. AICD in place.  IMPRESSION: No acute abnormality.  Chronic cardiomegaly.   Electronically Signed   By: Lorriane Shire M.D.   On: 05/04/2015 08:32    EKG: Independently reviewed. Atrial paced, rate of 67  Assessment/Plan Active Problems:   Obesity   Chest pain   Bradycardia   Chest pain -cycle CE -cards consult  Anemia -Hgb 10.8-- baseline in the 12s -Fe studies  Bradycardia -monitor on tele -?vagal response  Cardiology in ER- Dr. Caryl Comes saw  Code Status: full DVT Prophylaxis: Family Communication: patient/family Disposition  Plan:  Time spent: 44 min  Eulogio Bear [facility] Pager [phone]

## 2015-05-04 NOTE — ED Notes (Signed)
Cardiology MD at bedside.

## 2015-05-04 NOTE — ED Notes (Signed)
Pt made aware of bed assignment 

## 2015-05-04 NOTE — Consult Note (Signed)
ELECTROPHYSIOLOGY CONSULT NOTE  Patient ID: [name], MRN: [id number], DOB/AGE: 1975/11/23 39 y.o. Admit date: 05/04/2015 Date of Consult: 05/04/2015  Primary Physician: No primary care provider on file. Primary Cardiologist: DB  Chief Complaint: cheast pain   HPI [name] is a 39 y.o. female   With a history of a cardiomyopathy nonischemic a catheterization 2007 with persistent LV dysfunction. She has a history of an ICD which underwent replacement earlier this year.  She comes in with episodes of chest discomfort that occurred last night following using the commode. It was characterized by burning with some radiation into her left arm it was accompanied by diaphoresis. EMS was called. He was transported to hospital. Troponins were negative. She was then sent to x-ray. On returning from x-ray she was in the midst of another episode of chest discomfort. Telemetry leads were reattached and at that time she was noted to be bradycardic with intermittent complete heart block. PPD prolongation was not noted at the time of complete heart block. Close evaluation of telemetry suggested perhaps mild ST segment elevation.  She denies a history of exertional chest discomfort. She has been relatively stable on a complex medical regime for her congestive heart failure and cardiomyopathy.  She does have a history of abdominal wall spasms.   Past Medical History  Diagnosis Date  . Hypertension     currently on Labetalol 200mg  BID  . Infection     UTI;not frequent;last infection was in June  . Infection     Yeast;not frequent  . Asthma     Inhaler prn;triggered by seasonal changes mostly cold weathert;last asthma  attack years ago  . CHF (congestive heart failure) ([place]) 01/2006    Has Pacemaker and Defibrillator;was on heart meds;no longer taking since pregancy  . Anemia     Iron supplements in the past  . Migraines     Can't take Procardia XL, causes migraines  . Varicose veins 1998     Developed during last pregnancy  . Other primary cardiomyopathies   . Atrial fibrillation ([place])   . Paroxysmal ventricular tachycardia ([place])   . CHF (congestive heart failure) ([place])   . Hypertension   . Acute bronchitis and bronchiolitis       Surgical History:  Past Surgical History  Procedure Laterality Date  . Cesarean section  1998    d/t preeclampsia  . Breast reduction surgery  03/05/1994    d/t back, shoulder, and neck pain (42 F)  . Insert / replace / remove pacemaker   02/10/2006    Status post implantable cardioverter-defibrillator insertion  .Marland KitchenMarland KitchenMarland KitchenThe Medtronic Maximo VR, model 7232, single chamber  . Cardiac catheterization      EF 30-35%  . Cardiac defibrillator placement    . Implantable cardioverter defibrillator (icd) generator change N/A 09/10/2014    Procedure: ICD GENERATOR CHANGE;  Surgeon: Evans Lance, MD;  Location[facility] CATH LAB;  Service: Cardiovascular;  Laterality: N/A;     Home Meds: Prior to Admission medications   Medication Sig Start Date End Date Taking? Authorizing Provider  albuterol (PROVENTIL HFA;VENTOLIN HFA) 108 (90 BASE) MCG/ACT inhaler Inhale 2 puffs into the lungs every 6 (six) hours as needed for wheezing or shortness of breath. Rescue inhaler   Yes Historical Provider, MD  carvedilol (COREG) 25 MG tablet TAKE 1 TABLET BY MOUTH 2 TIMES DAILY WITH A MEAL. 01/11/15  Yes Jolaine Artist, MD  cetirizine (ZYRTEC) 10 MG tablet Take 10 mg by mouth  daily as needed for allergies or rhinitis.    Yes Historical Provider, MD  hydrALAZINE (APRESOLINE) 50 MG tablet Take 1.5 tablets (75 mg total) by mouth 3 (three) times daily. 03/28/15  Yes Jolaine Artist, MD  isosorbide mononitrate (IMDUR) 60 MG 24 hr tablet Take 1 tablet (60 mg total) by mouth daily. 01/16/15  Yes Shaune Pascal Bensimhon, MD  potassium chloride SA (K-DUR,KLOR-CON) 20 MEQ tablet TAKE 1 TABLET BY MOUTH 2 TIMES DAILY. 01/16/15  Yes Jolaine Artist, MD  ranitidine (ZANTAC) 150 MG tablet  Take 150 mg by mouth daily.   Yes Historical Provider, MD  sacubitril-valsartan (ENTRESTO) 49-51 MG Take 1 tablet by mouth 2 (two) times daily. 01/21/15  Yes Jolaine Artist, MD  spironolactone (ALDACTONE) 25 MG tablet Take 0.5 tablets (12.5 mg total) by mouth daily. 04/30/15  Yes Jolaine Artist, MD  torsemide (DEMADEX) 20 MG tablet TAKE 3 TABS BY MOUTH EVERY MORNING AND 1 TAB EVERY EVENING 01/14/15  Yes Larey Dresser, MD    Inpatient Medications:  . gi cocktail  30 mL Oral Once  . heparin  4,000 Units Intravenous Once  .  morphine injection  1 mg Intravenous Once  . pantoprazole (PROTONIX) IV  40 mg Intravenous Q24H     Allergies:  Allergies  Allergen Reactions  . Percocet [Oxycodone-Acetaminophen] Shortness Of Breath    Social History   Social History  . Marital Status: Married    Spouse Name: Chyrel Taha  . Number of Children: 1  . Years of Education: 15   Occupational History  . 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 [place] History Main Topics  . Smoking status: Never Smoker   . Smokeless tobacco: Never Used  . Alcohol Use: No  . Drug Use: No  . Sexual Activity:    Partners: Male    Birth Control/ Protection: Condom, Pill, None   Other Topics Concern  . Not on file   Social History Narrative   ** Merged History Encounter **         Family History  Problem Relation Age of Onset  . Sickle cell trait Daughter   . Other Father     Varicose veins  . Asthma Daughter   . Seizures Maternal Uncle   . Migraines Mother   . Migraines Daughter   . Migraines Cousin     Maternal  . Cancer Paternal Grandmother     Stomach  . Cancer Maternal Uncle     Prostate  . Cancer Maternal Uncle     Lung  . Ulcers Mother   . Lupus Maternal Aunt   . Hypertension Father   . Hypertension Paternal Aunt   . Other Daughter     blood transfusion @ birth;platelets were low  . Rheum arthritis Mother   . Diabetes Father     Controlled w/ diet and  exercise  . Diabetes Maternal Aunt   . Diabetes Paternal Aunt     x 2  . Diabetes Paternal Uncle   . Hypertension    . Heart disease       ROS:  Please see the history of present illness.     All other systems reviewed and negative.    Physical Exam:   Blood pressure 143/82, pulse 63, temperature 98.4 F (36.9 C), temperature source Oral, resp. rate 13, last menstrual period 04/12/2015, SpO2 100 %. General: Well developed, well nourished female in no acute distress.  Head: Normocephalic, atraumatic, sclera non-icteric, no xanthomas, nares are without discharge. EENT: normal Lymph Nodes:  none Back: without scoliosis/kyphosi *, no CVA tendersness Neck: Negative for carotid bruits. JVD not elevated. Lungs: Clear bilaterally to auscultation without wheezes, rales, or rhonchi. Breathing is unlabored. Heart: RRR with S1 S2. No* murmur , rubs, or gallops appreciated. Abdomen: Soft, non-tender, non-distended with normoactive bowel sounds. No hepatomegaly. No rebound/guarding. No obvious abdominal masses. Msk:  Strength and tone appear normal for age. Extremities: No clubbing or cyanosis. No  edema.  Distal pedal pulses are 2+ and equal bilaterally. Skin: Warm and Dry Neuro: Alert and oriented X 3. CN III-XII intact Grossly normal sensory and motor function . Psych:  Responds to questions appropriately with a normal affect.      Labs: Cardiac Enzymes No results for input(s): CKTOTAL, CKMB, TROPONINI in the last 72 hours. CBC Lab Results  Component Value Date   WBC 6.2 05/04/2015   HGB 10.8* 05/04/2015   HCT 35.3* 05/04/2015   MCV 82.5 05/04/2015   PLT 226 05/04/2015   PROTIME: No results for input(s): LABPROT, INR in the last 72 hours. Chemistry  Recent Labs Lab 05/04/15 0700  NA 143  K 4.2  CL 107  CO2 28  BUN 20  CREATININE 1.16*  CALCIUM 9.4  GLUCOSE 134*   Lipids No results found for: CHOL, HDL, LDLCALC, TRIG BNP PRO B NATRIURETIC PEPTIDE (BNP)  Date/Time  Value Ref Range Status  01/03/2014 03:57 PM 1766.0* 0 - 125 pg/mL Final  12/08/2013 12:15 AM 4070.0* 0 - 125 pg/mL Final  11/20/2013 09:28 AM 1242.0* 0 - 125 pg/mL Final  05/03/2013 09:03 AM 1544.0* 0 - 125 pg/mL Final   Thyroid Function Tests: No results for input(s): TSH, T4TOTAL, T3FREE, THYROIDAB in the last 72 hours.  Invalid input(s): FREET3    Miscellaneous No results found for: DDIMER  Radiology/Studies:  Dg Chest 2 View  05/04/2015   CLINICAL DATA:  Chest pain and shortness of breath.  EXAM: CHEST  2 VIEW  COMPARISON:  12/07/2013  FINDINGS: Chronic slight cardiomegaly. Pulmonary vascularity is normal and the lungs are clear. No osseous abnormality. AICD in place.  IMPRESSION: No acute abnormality.  Chronic cardiomegaly.   Electronically Signed   By: Lorriane Shire M.D.   On: 05/04/2015 08:32    EKG: nsr with t Wave inversion inferiortlu  Close evaluation of telemetry raises the possibility of ST segment elevation concomitant with the heart block. This raises a concern about coronary artery spasm.  She has subsequently episode of chest pain while is in the room. ECG could not be obtained immediately. By the time the ECG was obtained the pain had resolved but I did not see ST segment elevation in the 1 [place] that was available at that time.  Assessment and Plan: Chest pain  Heart block  History of nonischemic cardio myopathy  Implantable defibrillator  Morbid obesity   The patient is having episodes of chest burning with a telemetry tracing concerning for ST segment elevation suggestive of spasm. She has no known coronary artery disease although the evaluation was years ago. The differential would be viscous spasm or coronary spasm perhaps with secondary heart block.  We will plan to admit and observe.      Virl Axe

## 2015-05-04 NOTE — ED Notes (Signed)
Patient transported to X-ray 

## 2015-05-04 NOTE — ED Notes (Signed)
MD Smith at bedside.

## 2015-05-04 NOTE — Progress Notes (Addendum)
ANTICOAGULATION CONSULT NOTE - Initial Consult  Pharmacy Consult for Heparin Indication: STEMI  Allergies  Allergen Reactions  . Percocet [Oxycodone-Acetaminophen] Shortness Of Breath    Patient Measurements: TBW 117 kg as of June 2016 IBW 49 kg Heparin dosing BW: 78 kg  Vital Signs: Temp: 98.4 F (36.9 C) (10/08 0628) Temp Source: Oral (10/08 0628) BP: 183/100 mmHg (10/08 1237) Pulse Rate: 64 (10/08 1237)  Labs:  Recent Labs  05/04/15 0700  HGB 10.8*  HCT 35.3*  PLT 226  CREATININE 1.16*    CrCl cannot be calculated (Unknown ideal weight.).   Medical History: Past Medical History  Diagnosis Date  . Hypertension     currently on Labetalol 200mg  BID  . Infection     UTI;not frequent;last infection was in June  . Infection     Yeast;not frequent  . Asthma     Inhaler prn;triggered by seasonal changes mostly cold weathert;last asthma  attack years ago  . CHF (congestive heart failure) ([place]) 01/2006    Has Pacemaker and Defibrillator;was on heart meds;no longer taking since pregancy  . Anemia     Iron supplements in the past  . Migraines     Can't take Procardia XL, causes migraines  . Varicose veins 1998    Developed during last pregnancy  . Other primary cardiomyopathies   . Atrial fibrillation ([place])   . Paroxysmal ventricular tachycardia ([place])   . CHF (congestive heart failure) ([place])   . Hypertension   . Acute bronchitis and bronchiolitis     Assessment: 39 yo F presents on 10/8 with chest pain. Code STEMI called. Pharmacy consulted to dose heparin. No anticoag PTA. Given heparin 4,000 units x 1 in the ED. Hgb low at 10.8, plts wnl. No s/s of bleed  Goal of Therapy:  Heparin level 0.3-0.7 units/ml Monitor platelets by anticoagulation protocol: Yes   Plan:  No bolus as already received one in the ED Start heparin gtt at 1100 units/hr Check 6 hr HL Monitor daily HL, CBC, s/s of bleed  BATCHELDER,NATHAN J 05/04/2015,12:45 PM  Pt taken to cath  lab and to restart heparin 8-hr post sheath removal.  Sheath removed at 1402, so will schedule heparin infusion 1100 units/hr to restart at 2200.  F/u AM HL.  Angalina Ante L. Nicole Kindred, PharmD Clinical Pharmacy Resident Pager: [phone] 05/04/2015 2:29 PM

## 2015-05-05 ENCOUNTER — Encounter: Payer: Self-pay | Admitting: Family Medicine

## 2015-05-05 DIAGNOSIS — E669 Obesity, unspecified: Secondary | ICD-10-CM | POA: Diagnosis not present

## 2015-05-05 DIAGNOSIS — R001 Bradycardia, unspecified: Secondary | ICD-10-CM | POA: Diagnosis not present

## 2015-05-05 DIAGNOSIS — R079 Chest pain, unspecified: Secondary | ICD-10-CM | POA: Diagnosis not present

## 2015-05-05 DIAGNOSIS — D649 Anemia, unspecified: Secondary | ICD-10-CM | POA: Diagnosis not present

## 2015-05-05 LAB — BASIC METABOLIC PANEL
ANION GAP: 9 (ref 5–15)
BUN: 15 mg/dL (ref 6–20)
CALCIUM: 8.4 mg/dL — AB (ref 8.9–10.3)
CO2: 27 mmol/L (ref 22–32)
Chloride: 101 mmol/L (ref 101–111)
Creatinine, Ser: 1.29 mg/dL — ABNORMAL HIGH (ref 0.44–1.00)
GFR calc Af Amer: 59 mL/min — ABNORMAL LOW (ref >60)
GFR calc non Af Amer: 51 mL/min — ABNORMAL LOW (ref >60)
GLUCOSE: 141 mg/dL — AB (ref 65–99)
Potassium: 3.9 mmol/L (ref 3.5–5.1)
Sodium: 137 mmol/L (ref 135–145)

## 2015-05-05 LAB — CBC
HEMATOCRIT: 37.6 % (ref 36.0–46.0)
HEMOGLOBIN: 11.4 g/dL — AB (ref 12.0–15.0)
MCH: 25.3 pg — ABNORMAL LOW (ref 26.0–34.0)
MCHC: 30.3 g/dL (ref 30.0–36.0)
MCV: 83.4 fL (ref 78.0–100.0)
Platelets: 245 10*3/uL (ref 150–400)
RBC: 4.51 MIL/uL (ref 3.87–5.11)
RDW: 16.8 % — AB (ref 11.5–15.5)
WBC: 8.4 10*3/uL (ref 4.0–10.5)

## 2015-05-05 MED ORDER — PANTOPRAZOLE SODIUM 40 MG PO TBEC: 40.0000 mg | DELAYED_RELEASE_TABLET | Freq: Every day | ORAL | Status: DC

## 2015-05-05 MED ORDER — AMLODIPINE BESYLATE 2.5 MG PO TABS: 2.5000 mg | ORAL_TABLET | Freq: Every day | ORAL | Status: DC

## 2015-05-05 MED ORDER — ISOSORBIDE MONONITRATE ER 60 MG PO TB24
60.0000 mg | ORAL_TABLET | Freq: Every day | ORAL | Status: DC
  Administered 2015-05-05: 60 mg via ORAL
  Filled 2015-05-05: qty 1

## 2015-05-05 MED ORDER — AMLODIPINE BESYLATE 2.5 MG PO TABS
2.5000 mg | ORAL_TABLET | Freq: Every day | ORAL | Status: DC
  Administered 2015-05-05: 2.5 mg via ORAL
  Filled 2015-05-05: qty 1

## 2015-05-05 NOTE — Progress Notes (Signed)
Pt walked 3 times around unit. Vitals stable with no dizziness. Will continue to monitor.

## 2015-05-05 NOTE — Progress Notes (Signed)
Pt walked two times around unit. Vitals stable but pt complaining of dizziness. Notified Dr. Meda Coffee with Cardiology. Was advised to let her eat, rest for a little while and try again. Will continue to monitor.

## 2015-05-05 NOTE — Progress Notes (Signed)
Patient Name: [name] Date of Encounter: 05/05/2015  Active Problems:   Anemia   Obesity   Chest pain   Bradycardia   Length of Stay:   SUBJECTIVE  No chest pain since the cath yesterday, feels well, wants to go home.   CURRENT MEDS . carvedilol  25 mg Oral BID WC  . enoxaparin (LOVENOX) injection  40 mg Subcutaneous Q24H  . hydrALAZINE  75 mg Oral TID  . pantoprazole (PROTONIX) IV  40 mg Intravenous QHS  . potassium chloride SA  20 mEq Oral BID  . sacubitril-valsartan  1 tablet Oral BID  . sodium chloride  3 mL Intravenous Q12H  . spironolactone  12.5 mg Oral Daily  . torsemide  60 mg Oral Daily    OBJECTIVE  Filed Vitals:   05/05/15 0241 05/05/15 0331 05/05/15 0750 05/05/15 0800  BP:  115/65 142/79 142/79  Pulse:  84 79 75  Temp:  98.7 F (37.1 C) 97.9 F (36.6 C)   TempSrc:  Oral Oral   Resp:  19 22 10   Height:      Weight: 251 lb 8 oz (114.08 kg)     SpO2:  97% 97% 97%    Intake/Output Summary (Last 24 hours) at 05/05/15 0929 Last data filed at 05/05/15 0800  Gross per 24 hour  Intake 389.29 ml  Output   3400 ml  Net -3010.71 ml   Filed Weights   05/04/15 1444 05/04/15 2000 05/05/15 0241  Weight: 255 lb 4.7 oz (115.8 kg) 255 lb 4.7 oz (115.8 kg) 251 lb 8 oz (114.08 kg)    PHYSICAL EXAM  General: Pleasant, NAD. Neuro: Alert and oriented X 3. Moves all extremities spontaneously. Psych: Normal affect. HEENT:  Normal  Neck: Supple without bruits or JVD. Lungs:  Resp regular and unlabored, CTA. Heart: RRR no s3, s4, or murmurs. Abdomen: Soft, non-tender, non-distended, BS + x 4.  Extremities: No clubbing, cyanosis or edema. DP/PT/Radials 2+ and equal bilaterally.  Accessory Clinical Findings  CBC  Recent Labs  05/04/15 0700 05/05/15 0435  WBC 6.2 8.4  HGB 10.8* 11.4*  HCT 35.3* 37.6  MCV 82.5 83.4  PLT 226 735   Basic Metabolic Panel  Recent Labs  05/04/15 0700  NA 143  K 4.2  CL 107  CO2 28  GLUCOSE 134*  BUN 20    CREATININE 1.16*  CALCIUM 9.4  MG 2.5*    Recent Labs  05/04/15 1555 05/04/15 1845 05/04/15 2103  TROPONINI 0.03 0.04* <0.03   Radiology/Studies  Dg Chest 2 View  05/04/2015   CLINICAL DATA:  Chest pain and shortness of breath.  EXAM: CHEST  2 VIEW  COMPARISON:  12/07/2013  FINDINGS: Chronic slight cardiomegaly. Pulmonary vascularity is normal and the lungs are clear. No osseous abnormality. AICD in place.  IMPRESSION: No acute abnormality.  Chronic cardiomegaly.   Electronically Signed   By: Lorriane Shire M.D.   On: 05/04/2015 08:32   TELE: SR  Left cardiac cath: 05/04/2015  Widely patent coronary arteries. No obstructive coronary lesions are noted.  While imaging the right coronary, a 50% mid vessel stenosis developed, and completely resolved with intracoronary nitroglycerin. The development of this lesion was not associated with clinical symptoms.  Dilated and hypocontractile left ventricle with near akinesis of the mid inferior wall. Estimated EF in the 35% range.   ASSESSMENT AND PLAN  [name] is a 39 y.o. female morbidly obese with NICMP,  LVEF 35%, s/p ICD  placement earlier this year. Admitted for chest pain. Telemetry showed bradycardia with intermittent complete heart block. PPD prolongation was not noted at the time of complete heart block. Close evaluation of telemetry suggested perhaps mild ST segment elevation. She denies a history of exertional chest discomfort. She has been relatively stable on a complex medical regime for her congestive heart failure and cardiomyopathy.  The patient is having episodes of chest burning with a telemetry tracing concerning for ST segment elevation suggestive of spasm.  She underwent a cardiac cath yesterday with widely patent coronary arteries, LVEF 35%. The differential would be viscous spasm or coronary spasm perhaps with secondary heart block. No more heart block of telemetry.  She is asymptomatic on iv NTG, we will stop,  continue home dose of imdur 60 mg po daily, add amlodipine 2.5 mg po daily.  She should ambulate and if asymptomatic, can be discharged later today.   Signed, Dorothy Spark MD, [facility] 05/05/2015

## 2015-05-05 NOTE — Discharge Summary (Signed)
Physician Discharge Summary  [name] WFU:[id number] DOB: [birth year] DOA: 05/04/2015  PCP: No primary care provider on file.  Admit date: 05/04/2015 Discharge date: 05/05/2015  Time spent: 25 minutes  Recommendations for Outpatient Follow-up:  1. Needs up-titration of CV meds by Cardiology 2. Work for note given to patient 3. recommend bmet and cbc 1 week   Discharge Diagnoses:  Active Problems:   Anemia   Obesity   Chest pain   Bradycardia   Discharge Condition: fair  Diet recommendation: hh diet  Filed Weights   05/04/15 1444 05/04/15 2000 05/05/15 0241  Weight: 115.8 kg (255 lb 4.7 oz) 115.8 kg (255 lb 4.7 oz) 114.08 kg (251 lb 8 oz)    History of present illness:  39 y/o ? with h/o NICM dating back to 2004 It is documented somewhere in the chart at one point her EF was 10-15%-she had her last Cardiac cath 2007 showed no ischemia no plaque and a PPM was placed 01/2006 and upraded since Htn since age 35 She had a pacemaker placed 2/2 to this She was admitted to the Hospital as she had CP This was felt to maybe represent ACS-EKG's were non specific, troponin's neg  She was felt to be high risk and Cardiology consulted and Cardiac cath performed showing patent coronaries  Cardiology followed and made recommendations and patient was instrcuted to follow up with PCP  Only changes made to meds were addition amlodipine and Protonix which were sent to her pharmacy  She was gicvven a note putting her out of work until 05/07/15   Discharge Exam: Danley Danker Vitals:   05/05/15 1220  BP: 99/49  Pulse: 80  Temp: 98.1 F (36.7 C)  Resp: 14    General: eomi, ncat Cardiovascular: s1 s2 no m/r/g Respiratory: clear no added sound Slight Le swellin  Discharge Instructions   Discharge Instructions    Diet - low sodium heart healthy    Complete by:  As directed      Discharge instructions    Complete by:  As directed   Follow with your cardiologist as needed continue  your meds-the main change was addition of amlodipine 2.5 daily for your heart/blood pressure and addition of an acid reducer protonix to be taken for 6-8 wk     Increase activity slowly    Complete by:  As directed           Current Discharge Medication List    START taking these medications   Details  amLODipine (NORVASC) 2.5 MG tablet Take 1 tablet (2.5 mg total) by mouth daily. Qty: 30 tablet, Refills: 0    pantoprazole (PROTONIX) 40 MG tablet Take 1 tablet (40 mg total) by mouth daily. Qty: 30 tablet, Refills: 0      CONTINUE these medications which have NOT CHANGED   Details  albuterol (PROVENTIL HFA;VENTOLIN HFA) 108 (90 BASE) MCG/ACT inhaler Inhale 2 puffs into the lungs every 6 (six) hours as needed for wheezing or shortness of breath. Rescue inhaler    carvedilol (COREG) 25 MG tablet TAKE 1 TABLET BY MOUTH 2 TIMES DAILY WITH A MEAL. Qty: 60 tablet, Refills: 6    cetirizine (ZYRTEC) 10 MG tablet Take 10 mg by mouth daily as needed for allergies or rhinitis.     hydrALAZINE (APRESOLINE) 50 MG tablet Take 1.5 tablets (75 mg total) by mouth 3 (three) times daily. Qty: 135 tablet, Refills: 3   Associated Diagnoses: Congestive heart failure, unspecified congestive heart failure chronicity, unspecified congestive  heart failure type (HCC)    isosorbide mononitrate (IMDUR) 60 MG 24 hr tablet Take 1 tablet (60 mg total) by mouth daily. Qty: 30 tablet, Refills: 3   Associated Diagnoses: Chronic systolic heart failure (HCC)    potassium chloride SA (K-DUR,KLOR-CON) 20 MEQ tablet TAKE 1 TABLET BY MOUTH 2 TIMES DAILY. Qty: 60 tablet, Refills: 3   Associated Diagnoses: Chronic systolic heart failure (HCC)    ranitidine (ZANTAC) 150 MG tablet Take 150 mg by mouth daily.    sacubitril-valsartan (ENTRESTO) 49-51 MG Take 1 tablet by mouth 2 (two) times daily. Qty: 60 tablet, Refills: 3    spironolactone (ALDACTONE) 25 MG tablet Take 0.5 tablets (12.5 mg total) by mouth daily. Qty:  15 tablet, Refills: 0    torsemide (DEMADEX) 20 MG tablet TAKE 3 TABS BY MOUTH EVERY MORNING AND 1 TAB EVERY EVENING Qty: 120 tablet, Refills: 6       Allergies  Allergen Reactions  . Percocet [Oxycodone-Acetaminophen] Shortness Of Breath   Follow-up Information    Follow up with Glori Bickers, MD On 05/15/2015.   Specialty:  Cardiology   Why:  3:00 PM   Contact information:   [address] [place] [address] [phone]        The results of significant diagnostics from this hospitalization (including imaging, microbiology, ancillary and laboratory) are listed below for reference.    Significant Diagnostic Studies: Dg Chest 2 View  05/04/2015   CLINICAL DATA:  Chest pain and shortness of breath.  EXAM: CHEST  2 VIEW  COMPARISON:  12/07/2013  FINDINGS: Chronic slight cardiomegaly. Pulmonary vascularity is normal and the lungs are clear. No osseous abnormality. AICD in place.  IMPRESSION: No acute abnormality.  Chronic cardiomegaly.   Electronically Signed   By: Lorriane Shire M.D.   On: 05/04/2015 08:32    Microbiology: Recent Results (from the past 240 hour(s))  MRSA PCR Screening     Status: None   Collection Time: 05/04/15  3:23 PM  Result Value Ref Range Status   MRSA by PCR NEGATIVE NEGATIVE Final    Comment:        The GeneXpert MRSA Assay (FDA approved for NASAL specimens only), is one component of a comprehensive MRSA colonization surveillance program. It is not intended to diagnose MRSA infection nor to guide or monitor treatment for MRSA infections.      Labs: Basic Metabolic Panel:  Recent Labs Lab 05/04/15 0700 05/05/15 1345  NA 143 137  K 4.2 3.9  CL 107 101  CO2 28 27  GLUCOSE 134* 141*  BUN 20 15  CREATININE 1.16* 1.29*  CALCIUM 9.4 8.4*  MG 2.5*  --    Liver Function Tests: No results for input(s): AST, ALT, ALKPHOS, BILITOT, PROT, ALBUMIN in the last 168 hours. No results for input(s): LIPASE, AMYLASE in  the last 168 hours. No results for input(s): AMMONIA in the last 168 hours. CBC:  Recent Labs Lab 05/04/15 0700 05/05/15 0435  WBC 6.2 8.4  HGB 10.8* 11.4*  HCT 35.3* 37.6  MCV 82.5 83.4  PLT 226 245   Cardiac Enzymes:  Recent Labs Lab 05/04/15 1555 05/04/15 1845 05/04/15 2103  TROPONINI 0.03 0.04* <0.03   BNP: BNP (last 3 results) No results for input(s): BNP in the last 8760 hours.  ProBNP (last 3 results) No results for input(s): PROBNP in the last 8760 hours.  CBG: No results for input(s): GLUCAP in the last 168 hours.     Signed:  Nita Sells  [facility] 05/05/2015, 2:59 PM

## 2015-05-06 ENCOUNTER — Encounter (HOSPITAL_COMMUNITY): Payer: Self-pay | Admitting: Interventional Cardiology

## 2015-05-15 ENCOUNTER — Encounter (HOSPITAL_COMMUNITY): Payer: Self-pay | Admitting: Internal Medicine

## 2015-05-15 ENCOUNTER — Ambulatory Visit (HOSPITAL_COMMUNITY)
Admission: RE | Admit: 2015-05-15 | Discharge: 2015-05-15 | Disposition: A | Discharge status: Home / Self Care | Payer: No Typology Code available for payment source | Source: Ambulatory Visit | Attending: Internal Medicine | Admitting: Internal Medicine

## 2015-05-15 VITALS — BP 126/80 | HR 80 | Wt 253.0 lb

## 2015-05-15 DIAGNOSIS — G4733 Obstructive sleep apnea (adult) (pediatric): Secondary | ICD-10-CM | POA: Diagnosis not present

## 2015-05-15 DIAGNOSIS — J42 Unspecified chronic bronchitis: Secondary | ICD-10-CM | POA: Insufficient documentation

## 2015-05-15 DIAGNOSIS — Z833 Family history of diabetes mellitus: Secondary | ICD-10-CM | POA: Insufficient documentation

## 2015-05-15 DIAGNOSIS — R0782 Intercostal pain: Secondary | ICD-10-CM | POA: Diagnosis not present

## 2015-05-15 DIAGNOSIS — I428 Other cardiomyopathies: Secondary | ICD-10-CM | POA: Diagnosis not present

## 2015-05-15 DIAGNOSIS — I4891 Unspecified atrial fibrillation: Secondary | ICD-10-CM | POA: Insufficient documentation

## 2015-05-15 DIAGNOSIS — Z8249 Family history of ischemic heart disease and other diseases of the circulatory system: Secondary | ICD-10-CM | POA: Diagnosis not present

## 2015-05-15 DIAGNOSIS — I10 Essential (primary) hypertension: Secondary | ICD-10-CM | POA: Insufficient documentation

## 2015-05-15 DIAGNOSIS — Z79899 Other long term (current) drug therapy: Secondary | ICD-10-CM | POA: Diagnosis not present

## 2015-05-15 DIAGNOSIS — E669 Obesity, unspecified: Secondary | ICD-10-CM | POA: Diagnosis not present

## 2015-05-15 DIAGNOSIS — J45909 Unspecified asthma, uncomplicated: Secondary | ICD-10-CM | POA: Diagnosis not present

## 2015-05-15 DIAGNOSIS — Z95810 Presence of automatic (implantable) cardiac defibrillator: Secondary | ICD-10-CM | POA: Insufficient documentation

## 2015-05-15 DIAGNOSIS — I5022 Chronic systolic (congestive) heart failure: Secondary | ICD-10-CM | POA: Diagnosis not present

## 2015-05-15 MED ORDER — SACUBITRIL-VALSARTAN 97-103 MG PO TABS: 1.0000 | ORAL_TABLET | Freq: Two times a day (BID) | ORAL | Status: DC

## 2015-05-15 MED ORDER — AMLODIPINE BESYLATE 2.5 MG PO TABS: 2.5000 mg | ORAL_TABLET | Freq: Every day | ORAL | Status: DC

## 2015-05-15 MED ORDER — PANTOPRAZOLE SODIUM 40 MG PO TBEC: 40.0000 mg | DELAYED_RELEASE_TABLET | Freq: Every day | ORAL | Status: DC

## 2015-05-15 NOTE — Progress Notes (Signed)
ADVANCED HF CLINIC NOTE  Patient ID: [name], female   DOB: 15-Sep-1975, 39 y.o.   MRN: [id number]  PCP: Dr. Heath Gold  OB:  Dr. Leo Grosser  EP: Dr. Lovena Le CHF: Bensimhon  HPI: [name] is a 39 year old woman with h/o obesity, chronic systolic heart failure [name] to nonischemic cardiomyopathy (cath 2007. No CAD. previously recovered, but now 25-30%, 07/2014) s/p [employer] ICD implantation, asthma/chronic bronchitis and severe hypertension.   ECHOs 04/27/2011 45-50%.   08/20/11 ECHO 55-60%   05/19/12 EF ~40%   07/01/12 EF ~40%   08/02/12 EF~40%    2/14 EF ~50%                          5/14 EF ~35%                          1/16 EF 25-30%                          She delievered her baby on September 25, 2012 without complication. Admitted 5/18 -12/14/12 with hypertensive crisis and decompensated systolic heart failure.   CPX 01/2014: Resting HR 75, Peak HR 121 Peak VO2 15.2, 74.4% predicted VE/VCO2 slope: 26.4 OUES: 1.93 Peak RER 1.15 VE/MVV 61.7%  Follow up for Heart Failure:  Admitted on 10/8 with multiple episdoes of CP and diaphoresis with pain running down her left arm. Taken emergently for cath. Showed normal coronaries initially but then developed 50% RCA spasm. LVEF 35%. LVEDP 13. Started on amlodipine and Protonix.  Doing well since. No further CP. Breathing ok. Gets SOB if goes too fast. Mild edema. Weight stable    LABS: 01/03/14: K 4.2 Cr 1.27 pBNP 1766 (down from 4070)            02/13/14: K 4.4, creatinine 1.27            05/15/14: K 4.0, Creatinine 1.36  08/29/14: K 3.5, Creatinine 1.17  ICD: Optivol- fluid up and down but no threshold crossings. No AF/VT.   SH: Married. Lives in [place], 2 children. Working on [employer]. No ETOH or smoking  FH: Mother living: DM2, HTN, no CAD        Father living: DM2, HTN  ROS: All systems negative except as listed in HPI, PMH and Problem List.  Past Medical History  Diagnosis Date  . Hypertension     currently on Labetalol 200mg  BID    . Infection     UTI;not frequent;last infection was in June  . Infection     Yeast;not frequent  . Asthma     Inhaler prn;triggered by seasonal changes mostly cold weathert;last asthma  attack years ago  . CHF (congestive heart failure) ([place]) 01/2006    Has Pacemaker and Defibrillator;was on heart meds;no longer taking since pregancy  . Anemia     Iron supplements in the past  . Migraines     Can't take Procardia XL, causes migraines  . Varicose veins 1998    Developed during last pregnancy  . Other primary cardiomyopathies   . Atrial fibrillation ([place])   . Paroxysmal ventricular tachycardia ([place])   . CHF (congestive heart failure) ([place])   . Hypertension   . Acute bronchitis and bronchiolitis     Current Outpatient Prescriptions  Medication Sig Dispense Refill  . albuterol (PROVENTIL HFA;VENTOLIN HFA) 108 (90 BASE) MCG/ACT inhaler Inhale 2 puffs into the lungs  every 6 (six) hours as needed for wheezing or shortness of breath. Rescue inhaler    . amLODipine (NORVASC) 2.5 MG tablet Take 1 tablet (2.5 mg total) by mouth daily. 30 tablet 0  . carvedilol (COREG) 25 MG tablet TAKE 1 TABLET BY MOUTH 2 TIMES DAILY WITH A MEAL. 60 tablet 6  . cetirizine (ZYRTEC) 10 MG tablet Take 10 mg by mouth daily as needed for allergies or rhinitis.     . hydrALAZINE (APRESOLINE) 50 MG tablet Take 1.5 tablets (75 mg total) by mouth 3 (three) times daily. 135 tablet 3  . isosorbide mononitrate (IMDUR) 60 MG 24 hr tablet Take 1 tablet (60 mg total) by mouth daily. 30 tablet 3  . pantoprazole (PROTONIX) 40 MG tablet Take 1 tablet (40 mg total) by mouth daily. 30 tablet 0  . potassium chloride SA (K-DUR,KLOR-CON) 20 MEQ tablet TAKE 1 TABLET BY MOUTH 2 TIMES DAILY. 60 tablet 3  . ranitidine (ZANTAC) 150 MG tablet Take 150 mg by mouth daily.    . sacubitril-valsartan (ENTRESTO) 49-51 MG Take 1 tablet by mouth 2 (two) times daily. 60 tablet 3  . spironolactone (ALDACTONE) 25 MG tablet Take 0.5 tablets (12.5 mg  total) by mouth daily. 15 tablet 0  . torsemide (DEMADEX) 20 MG tablet TAKE 3 TABS BY MOUTH EVERY MORNING AND 1 TAB EVERY EVENING 120 tablet 6   No current facility-administered medications for this encounter.    Filed Vitals:   05/15/15 1515  BP: 126/80  Pulse: 80  Weight: 253 lb (114.76 kg)  SpO2: 97%    PHYSICAL EXAM: General: Well appearing; No resp difficulty.  HEENT: normal Neck: supple. JVP 6-7 ; Carotids 2+ bilaterally; no bruits. No lymphadenopathy or thryomegaly appreciated. Cor: PMI normal. Regular rate & rhythm. +s4. No murmurs appreciated Lungs: clear Abdomen: soft, nontender, mildly distended and obese. No hepatosplenomegaly. No bruits or masses. Good bowel sounds. Extremities: no cyanosis, clubbing, rash, tr-1+ edema to knees Neuro: alert & orientedx3, cranial nerves grossly intact. Moves all 4 extremities w/o difficulty. Affect pleasant.  ASSESSMENT & PLAN:  1) Chronic systolic heart failure, NICM s/p ICD likely r/t HTN, last echo 1/16 EF 25-30%. Cath 8/16 EF 35% - Chronic NYHA II symptoms. Volume status labile but relatively well controlled. Continue current regimen.  - Continue current doses of hydralazine/nitrates and Spiro. - Increase Entresto 97/103 bid for HTN and HF.   - Reinforced the need and importance of daily weights, a low sodium diet, and fluid restriction (less than 2 L a day). Instructed to call the HF clinic if weight increases more than 3 lbs overnight or 5 lbs in a week.  - Consider ivabradine if HR remains > 70 - Repeat echo at next visit 2) OSA  - Lost CPAP machine. Scheduled to see Dr. Radford Pax to re-establish treatment of OSA on 11/4.  3) HTN - Blood pressure well controlled. Continue current regimen. 4) Asthma/chronic bronchitis -f/u with Pulmonary  5) Obesity: - Overweight. Discussed the need to lose weight and increase activity. Continue to work cutting back on portions and to be as active as possible.  6) CP - Cath reviewed. Normal  coronaries. Negative troponin. May be GERD. Continue Protonix  F/U 3 months with Echo    Bensimhon, Daniel,MD 3:25 PM

## 2015-05-15 NOTE — Patient Instructions (Signed)
Increase Entresto to 97/103 mg Twice daily   Your physician has requested that you have an echocardiogram. Echocardiography is a painless test that uses sound waves to create images of your heart. It provides your doctor with information about the size and shape of your heart and how well your heart's chambers and valves are working. This procedure takes approximately one hour. There are no restrictions for this procedure.  Your physician recommends that you schedule a follow-up appointment in: 3 months

## 2015-05-16 MED ORDER — SACUBITRIL-VALSARTAN 97-103 MG PO TABS: 1.0000 | ORAL_TABLET | Freq: Two times a day (BID) | ORAL | Status: DC

## 2015-05-16 NOTE — Addendum Note (Signed)
Encounter addended by: Scarlette Calico, RN on: 05/16/2015  2:07 PM<BR>     Documentation filed: Orders

## 2015-05-31 ENCOUNTER — Encounter (HOSPITAL_BASED_OUTPATIENT_CLINIC_OR_DEPARTMENT_OTHER): Payer: Self-pay

## 2015-06-03 ENCOUNTER — Other Ambulatory Visit (HOSPITAL_COMMUNITY): Payer: Self-pay | Admitting: Internal Medicine

## 2015-06-12 ENCOUNTER — Telehealth: Payer: Self-pay | Admitting: Cardiology

## 2015-06-12 NOTE — Telephone Encounter (Signed)
New Message  Following up. Pt was seen in ER on 10/08. Wanted to see if there were any programming changes after that time. Please call back to discuss

## 2015-06-13 ENCOUNTER — Telehealth (HOSPITAL_COMMUNITY): Payer: Self-pay

## 2015-06-13 NOTE — Telephone Encounter (Signed)
Initially she felt she had some cold symptoms but yesterday flet more short of breath and needed to increase her pillows to sleep No weight gain. Edema bilaterally more so this week with swelling to knees. Takes Torsemide 3 (20 mg) tablets every morning and 1 every evening. Potasium 20 mEq AM, 20 mEq PM Is going to increase Torsemide by one 20 mg tablet for the next three days

## 2015-06-14 NOTE — Telephone Encounter (Signed)
LMTCB//sss 

## 2015-06-14 NOTE — Telephone Encounter (Signed)
agree

## 2015-06-24 ENCOUNTER — Telehealth: Payer: Self-pay | Admitting: Cardiology

## 2015-06-24 ENCOUNTER — Ambulatory Visit (INDEPENDENT_AMBULATORY_CARE_PROVIDER_SITE_OTHER): Payer: No Typology Code available for payment source | Admitting: *Deleted

## 2015-06-24 DIAGNOSIS — I4729 Other ventricular tachycardia: Secondary | ICD-10-CM

## 2015-06-24 DIAGNOSIS — I472 Ventricular tachycardia: Secondary | ICD-10-CM

## 2015-06-24 NOTE — Telephone Encounter (Signed)
LMOVM reminding pt to send remote transmission.   

## 2015-06-25 ENCOUNTER — Encounter: Payer: Self-pay | Admitting: Cardiology

## 2015-06-25 ENCOUNTER — Telehealth: Payer: Self-pay | Admitting: Internal Medicine

## 2015-06-25 NOTE — Telephone Encounter (Signed)
NEW MESSAGE   4. Are you calling to see if we received your device transmission? Y  Comments: Pt called states that she received an error message last night during her transmission and would like a call back to discuss.

## 2015-06-25 NOTE — Telephone Encounter (Signed)
Spoke w/ pt and informed her that her transmission was not received. She stated that she got an error code. Instructed pt to call tech services when she is home. Pt verbalized understanding.

## 2015-06-27 ENCOUNTER — Telehealth: Payer: Self-pay | Admitting: Cardiology

## 2015-06-27 NOTE — Progress Notes (Signed)
Remote ICD transmission.   

## 2015-06-27 NOTE — Telephone Encounter (Signed)
[employer] representative called and wanted to know if pt has had a follow up appt since hospital visit on 05-04-15. I informed her that pt had only had a remote transmission on 06-25-15 w/ our office. She verbalized understanding.

## 2015-07-03 ENCOUNTER — Other Ambulatory Visit (HOSPITAL_COMMUNITY): Payer: Self-pay | Admitting: Internal Medicine

## 2015-07-04 LAB — CUP PACEART REMOTE DEVICE CHECK
Brady Statistic RV Percent Paced: 0.01 %
Date Time Interrogation Session: 20161130042720
HIGH POWER IMPEDANCE MEASURED VALUE: 41 Ohm
HIGH POWER IMPEDANCE MEASURED VALUE: 53 Ohm
Implantable Lead Implant Date: 20070718
Lead Channel Impedance Value: 285 Ohm
Lead Channel Pacing Threshold Amplitude: 2.5 V
Lead Channel Pacing Threshold Pulse Width: 0.4 ms
Lead Channel Sensing Intrinsic Amplitude: 17 mV
Lead Channel Setting Pacing Pulse Width: 0.7 ms
Lead Channel Setting Sensing Sensitivity: 0.3 mV
MDC IDC LEAD LOCATION: 753860
MDC IDC LEAD MODEL: 5947
MDC IDC MSMT BATTERY REMAINING LONGEVITY: 134 mo
MDC IDC MSMT BATTERY VOLTAGE: 3.06 V
MDC IDC MSMT LEADCHNL RV IMPEDANCE VALUE: 361 Ohm
MDC IDC MSMT LEADCHNL RV SENSING INTR AMPL: 17 mV
MDC IDC SET LEADCHNL RV PACING AMPLITUDE: 3.5 V

## 2015-07-05 ENCOUNTER — Encounter: Payer: Self-pay | Admitting: Cardiology

## 2015-07-19 ENCOUNTER — Encounter: Payer: Self-pay | Admitting: Cardiology

## 2015-08-09 ENCOUNTER — Telehealth (HOSPITAL_COMMUNITY): Payer: Self-pay | Admitting: Vascular Surgery

## 2015-08-09 NOTE — Telephone Encounter (Signed)
Left pt message to make f/u appt in Feb

## 2015-08-14 MED FILL — TORSEMIDE 20 MG TABLET: 20 | 30 days supply | Qty: 120 | Fill #6

## 2015-08-14 MED FILL — ISOSORBIDE MN ER 60 MG TAB: 60 | 30 days supply | Qty: 30 | Fill #2

## 2015-08-14 MED FILL — hydrALAZINE HCL 50 MG TABS: 50 | 30 days supply | Qty: 135 | Fill #3

## 2015-08-14 MED FILL — AMLODIPINE BESYLATE 2.5 MG: 2.5 | 30 days supply | Qty: 30 | Fill #2

## 2015-08-14 MED FILL — PANTOPRAZOLE SOD DR 40 MG T: 40 | 30 days supply | Qty: 30 | Fill #2

## 2015-08-14 MED FILL — POTASSIUM CL ER 20 MEQ TAB: 20 | 30 days supply | Qty: 60 | Fill #2

## 2015-08-14 MED FILL — SPIRONOLACTONE 25 MG TABLET: 25 | 30 days supply | Qty: 15 | Fill #1

## 2015-08-14 MED FILL — CARVEDILOL 25 MG TABLET: 25 | 30 days supply | Qty: 60 | Fill #6

## 2015-08-16 ENCOUNTER — Ambulatory Visit (HOSPITAL_BASED_OUTPATIENT_CLINIC_OR_DEPARTMENT_OTHER): Payer: BLUE CROSS/BLUE SHIELD | Attending: Internal Medicine

## 2015-08-16 VITALS — Ht 61.0 in | Wt 250.0 lb

## 2015-08-16 DIAGNOSIS — G4733 Obstructive sleep apnea (adult) (pediatric): Secondary | ICD-10-CM | POA: Diagnosis present

## 2015-08-16 DIAGNOSIS — I509 Heart failure, unspecified: Secondary | ICD-10-CM | POA: Diagnosis not present

## 2015-08-16 DIAGNOSIS — R5383 Other fatigue: Secondary | ICD-10-CM | POA: Diagnosis not present

## 2015-08-16 DIAGNOSIS — I10 Essential (primary) hypertension: Secondary | ICD-10-CM | POA: Insufficient documentation

## 2015-08-16 DIAGNOSIS — R0683 Snoring: Secondary | ICD-10-CM | POA: Diagnosis not present

## 2015-08-16 DIAGNOSIS — Z79899 Other long term (current) drug therapy: Secondary | ICD-10-CM | POA: Insufficient documentation

## 2015-08-20 ENCOUNTER — Telehealth (HOSPITAL_COMMUNITY): Payer: Self-pay | Admitting: Vascular Surgery

## 2015-08-20 NOTE — Telephone Encounter (Signed)
Pt called she had sleep study done, she still has sleep apnea she needs her machine, she wants to know what's the next step she would like to speak to someone ASAP. PLEASE ADVISE

## 2015-08-23 NOTE — Telephone Encounter (Signed)
SPOKE WITH PATIENT REGARDING NEEDING TO SEE DR.TURNER, OR [place] [facility], WILL WORK ON REFERRAL AND CALL PATIENT BACK ONCE APPT IS MADE  PT VOICED UNDERSTANDING

## 2015-08-27 ENCOUNTER — Telehealth: Payer: Self-pay | Admitting: Cardiology

## 2015-08-27 NOTE — Addendum Note (Signed)
Addended by: Sueanne Margarita on: 08/27/2015 04:35 PM   Modules accepted: Orders

## 2015-08-27 NOTE — Sleep Study (Signed)
Patient Name: [name], [name] MRN: [id number] Study Date: 08/16/2015 Gender: Female D.O.B: 1976/03/05 Age (years): 54 Referring Provider: Shaune Pascal Bensimhon Interpreting Physician: Fransico Him MD, ABSM RPSGT: Baxter Flattery Weight (lbs): 250 Height (inches): 61 BMI: 47 Neck Size: 15.00  CLINICAL INFORMATION Sleep Study Type: Split Night CPAP Indication for sleep study: Congestive Heart Failure, Fatigue, Hypertension, Obesity, OSA, Snoring, Witnessed Apneas  SLEEP STUDY TECHNIQUE As per the AASM Manual for the Scoring of Sleep and Associated Events v2.3 (April 2016) with a hypopnea requiring 4% desaturations. The channels recorded and monitored were frontal, central and occipital EEG, electrooculogram (EOG), submentalis EMG (chin), nasal and oral airflow, thoracic and abdominal wall motion, anterior tibialis EMG, snore microphone, electrocardiogram, and pulse oximetry. Continuous positive airway pressure (CPAP) was initiated when the patient met split night criteria and was titrated according to treat sleep-disordered breathing.  MEDICATIONS Medications taken by the patient : Albuterol, Amlodipine, Coreg, Zyrtec, Hydralazine, Imdur, Protonix, Kdur, Zantac, Entresto, Aldactone, Torsemide. Medications administered by patient during sleep study : No sleep medicine administered.  RESPIRATORY PARAMETERS Diagnostic Total AHI (/hr): 74.0  RDI (/hr):74.0  OA Index (/hr):41.1         CA Index (/hr): 0.0 REM AHI (/hr): N/A  NREM AHI (/hr):74.0 Supine AHI (/hr):3.2   Non-supine AHI (/hr):94.35 Min O2 Sat (%):75.00  Mean O2 (%):91.78 Time below 88% (min):13.9    Titration Optimal Pressure (cm):14   AHI at Optimal Pressure (/hr):2.5  Min O2 at Optimal Pressure (%):87.0 Supine % at Optimal (%):100   Sleep % at Optimal (%):100    SLEEP ARCHITECTURE The recording time for the entire night was 426.7 minutes. During a baseline period of 196.4 minutes, the patient slept for 167.9 minutes in REM  and nonREM, yielding a sleep efficiency of 85.5%. Sleep onset after lights out was 18.6 minutes with a REM latency of N/A minutes. The patient spent 4.17% of the night in stage N1 sleep, 45.79% in stage N2 sleep, 50.04% in stage N3 and 0.00% in REM.   During the titration period of 217.3 minutes, the patient slept for 204.7 minutes in REM and nonREM, yielding a sleep efficiency of 94.2%. Sleep onset after CPAP initiation was 8.7 minutes with a REM latency of 81.5 minutes. The patient spent 5.13% of the night in stage N1 sleep, 46.99% in stage N2 sleep, 38.11% in stage N3 and 9.77% in REM.  CARDIAC DATA The 2 lead EKG demonstrated sinus rhythm. The mean heart rate was 75.75 beats per minute. Other EKG findings include: None.  LEG MOVEMENT DATA The total Periodic Limb Movements of Sleep (PLMS) were 20. The PLMS index was 3.22 .  IMPRESSIONS - Severe obstructive sleep apnea occurred during the diagnostic portion of the study (AHI = 74.0/hour). An optimal PAP pressure was selected for this patient ( 14 cm of water) although he was not able to reach REM supine sleep.   - No significant central sleep apnea occurred during the diagnostic portion of the study (CAI = 0.0/hour). - Severe oxygen desaturation was noted during the diagnostic portion of the study (Min O2 = 75.00%). - No snoring was audible during the diagnostic portion of the study. - No cardiac abnormalities were noted during this study. - Clinically significant periodic limb movements did not occur during sleep.  DIAGNOSIS - Obstructive Sleep Apnea (327.23 [G47.33 ICD-10])  RECOMMENDATIONS - Trial of CPAP therapy on 14 cm H2O with a Large size [employer]ips Respironics Nasal Mask DreamWear mask and heated humidification. - Avoid alcohol, sedatives  and other CNS depressants that may worsen sleep apnea and disrupt normal sleep architecture. - Sleep hygiene should be reviewed to assess factors that may improve sleep quality. - Weight management  and regular exercise should be initiated or continued. - Return to Sleep Center for re-evaluation after 10 weeks of therapy    [place], American Board of Sleep Medicine  ELECTRONICALLY SIGNED ON:  08/27/2015, 4:21 PM [place] PH: [phone]   FX: [phone] [place]

## 2015-08-27 NOTE — Telephone Encounter (Signed)
Please let patient know that they have significant sleep apnea and had successful CPAP titration and will be set up with CPAP unit.  Please let DME know that order is in EPIC.  Please set patient up for OV in 10 weeks 

## 2015-09-02 NOTE — Telephone Encounter (Signed)
Left message for patient to call back  

## 2015-09-03 NOTE — Telephone Encounter (Signed)
Patient informed of information. Stated verbal understanding.  [place] notified of orders.  Once patient has started CPAP Therapy I will schedule the 10 week follow-up

## 2015-09-06 NOTE — Telephone Encounter (Signed)
Patient is agreeable to go through the CPAP Assistance program.  She said she will probably have the money by 2/14, so I will call  Back then to discuss how the program works and to give her information on how to make the $100 payment that is required by the program.

## 2015-09-06 NOTE — Telephone Encounter (Signed)
Note from [facility]     Good morning! Heads up on this patient. He has declined set up of CPAP at this time due to financial reasons.

## 2015-09-06 NOTE — Telephone Encounter (Signed)
Will she qualify for the patient assistance program

## 2015-09-06 NOTE — Telephone Encounter (Signed)
Left message for patient to call.  Will discuss the CPAP Assistance Program with her when she calls.

## 2015-09-12 ENCOUNTER — Other Ambulatory Visit (HOSPITAL_COMMUNITY): Payer: Self-pay | Admitting: Internal Medicine

## 2015-09-12 ENCOUNTER — Other Ambulatory Visit (HOSPITAL_COMMUNITY): Payer: Self-pay | Admitting: Cardiology

## 2015-09-12 ENCOUNTER — Ambulatory Visit (HOSPITAL_COMMUNITY)
Admission: RE | Admit: 2015-09-12 | Discharge: 2015-09-12 | Disposition: A | Discharge status: Home / Self Care | Payer: BLUE CROSS/BLUE SHIELD | Source: Ambulatory Visit | Attending: Internal Medicine | Admitting: Internal Medicine

## 2015-09-12 ENCOUNTER — Encounter (HOSPITAL_COMMUNITY): Payer: Self-pay | Admitting: Internal Medicine

## 2015-09-12 VITALS — BP 112/78 | HR 72 | Wt 257.5 lb

## 2015-09-12 DIAGNOSIS — E669 Obesity, unspecified: Secondary | ICD-10-CM | POA: Diagnosis not present

## 2015-09-12 DIAGNOSIS — G4733 Obstructive sleep apnea (adult) (pediatric): Secondary | ICD-10-CM | POA: Insufficient documentation

## 2015-09-12 DIAGNOSIS — I110 Hypertensive heart disease with heart failure: Secondary | ICD-10-CM | POA: Diagnosis not present

## 2015-09-12 DIAGNOSIS — J42 Unspecified chronic bronchitis: Secondary | ICD-10-CM

## 2015-09-12 DIAGNOSIS — Z833 Family history of diabetes mellitus: Secondary | ICD-10-CM | POA: Diagnosis not present

## 2015-09-12 DIAGNOSIS — J45909 Unspecified asthma, uncomplicated: Secondary | ICD-10-CM | POA: Insufficient documentation

## 2015-09-12 DIAGNOSIS — Z95810 Presence of automatic (implantable) cardiac defibrillator: Secondary | ICD-10-CM | POA: Insufficient documentation

## 2015-09-12 DIAGNOSIS — I5022 Chronic systolic (congestive) heart failure: Secondary | ICD-10-CM | POA: Insufficient documentation

## 2015-09-12 DIAGNOSIS — Z79899 Other long term (current) drug therapy: Secondary | ICD-10-CM | POA: Insufficient documentation

## 2015-09-12 DIAGNOSIS — Z8249 Family history of ischemic heart disease and other diseases of the circulatory system: Secondary | ICD-10-CM | POA: Insufficient documentation

## 2015-09-12 DIAGNOSIS — I428 Other cardiomyopathies: Secondary | ICD-10-CM | POA: Insufficient documentation

## 2015-09-12 DIAGNOSIS — I509 Heart failure, unspecified: Secondary | ICD-10-CM

## 2015-09-12 LAB — BASIC METABOLIC PANEL
Anion gap: 10 (ref 5–15)
BUN: 10 mg/dL (ref 6–20)
CHLORIDE: 101 mmol/L (ref 101–111)
CO2: 29 mmol/L (ref 22–32)
Calcium: 8.7 mg/dL — ABNORMAL LOW (ref 8.9–10.3)
Creatinine, Ser: 1.22 mg/dL — ABNORMAL HIGH (ref 0.44–1.00)
GFR calc Af Amer: >60 mL/min (ref >60)
GFR calc non Af Amer: 54 mL/min — ABNORMAL LOW (ref >60)
GLUCOSE: 103 mg/dL — AB (ref 65–99)
POTASSIUM: 3.7 mmol/L (ref 3.5–5.1)
Sodium: 140 mmol/L (ref 135–145)

## 2015-09-12 MED ORDER — TORSEMIDE 20 MG PO TABS: ORAL_TABLET | ORAL | Status: DC

## 2015-09-12 MED ORDER — POTASSIUM CHLORIDE CRYS ER 20 MEQ PO TBCR: EXTENDED_RELEASE_TABLET | ORAL | Status: DC

## 2015-09-12 MED ORDER — ALBUTEROL SULFATE HFA 108 (90 BASE) MCG/ACT IN AERS: 2.0000 | INHALATION_SPRAY | Freq: Four times a day (QID) | RESPIRATORY_TRACT | Status: DC | PRN

## 2015-09-12 MED ORDER — HYDRALAZINE HCL 50 MG PO TABS: 75.0000 mg | ORAL_TABLET | Freq: Three times a day (TID) | ORAL | Status: DC

## 2015-09-12 MED FILL — PANTOPRAZOLE SOD DR 40 MG T: 40 | 30 days supply | Qty: 30 | Fill #3

## 2015-09-12 MED FILL — TORSEMIDE 20 MG TABLET: 20 | 30 days supply | Qty: 120 | Fill #0

## 2015-09-12 MED FILL — POTASSIUM CL ER 20 MEQ TAB: 20 | 30 days supply | Qty: 60 | Fill #3

## 2015-09-12 MED FILL — AMLODIPINE BESYLATE 2.5 MG: 2.5 | 30 days supply | Qty: 30 | Fill #3

## 2015-09-12 MED FILL — ISOSORBIDE MN ER 60 MG TAB: 60 | 30 days supply | Qty: 30 | Fill #3

## 2015-09-12 MED FILL — hydrALAZINE HCL 50 MG TABS: 50 | 30 days supply | Qty: 135 | Fill #0

## 2015-09-12 MED FILL — SPIRONOLACTONE 25 MG TABLET: 25 | 30 days supply | Qty: 15 | Fill #2

## 2015-09-12 NOTE — Patient Instructions (Signed)
Labs today  Your physician has requested that you have an echocardiogram. Echocardiography is a painless test that uses sound waves to create images of your heart. It provides your doctor with information about the size and shape of your heart and how well your heart's chambers and valves are working. This procedure takes approximately one hour. There are no restrictions for this procedure.  You have been referred to Pulmonary  Your physician recommends that you schedule a follow-up appointment in: 3

## 2015-09-12 NOTE — Progress Notes (Signed)
Patient ID: Briana TULLOCH, female   DOB: 1975/12/04, 40 y.o.   MRN: [id number]  ADVANCED HF CLINIC NOTE  Patient ID: [name], female   DOB: 04/11/1976, 40 y.o.   MRN: [id number]  PCP: Dr. Heath Gold  OB:  Dr. Leo Grosser  EP: Dr. Lovena Le CHF: Bensimhon  HPI: [name] is a 40 year old woman with h/o obesity, chronic systolic heart failure due to nonischemic cardiomyopathy (cath 2007. No CAD) s/p Medtronic ICD implantation, asthma/chronic bronchitis and severe hypertension.   ECHOs 04/27/2011 45-50%.   08/20/11 ECHO 55-60%   05/19/12 EF ~40%   07/01/12 EF ~40%   08/02/12 EF~40%    2/14 EF ~50%                          5/14 EF ~35%                          1/16 EF 25-30%                           CPX 01/2014: Resting HR 75, Peak HR 121 Peak VO2 15.2, 74.4% predicted VE/VCO2 slope: 26.4 OUES: 1.93 Peak RER 1.15 VE/MVV 61.7%  Follow up for Heart Failure:  Last visit we went up on Entresto.  Has had to increase her torsemide about 2 times a month for swelling and weight gain of 3-5 lbs. Weight up slightly now. Usually 249 -250, and was 256 this morning at home. Taking daily weights. Took an extra this morning. Having some DOE, but mostly bendopnea. No more CP. Watching her fluid, thinks she sometimes goes over. Not eating fast food or canned foods. Recently got a gym membership and going to start doing low-impact exercises. Frustrated about inability to lose weight.    LABS: 01/03/14: K 4.2 Cr 1.27 pBNP 1766 (down from 4070)            02/13/14: K 4.4, creatinine 1.27            05/15/14: K 4.0, Creatinine 1.36  08/29/14: K 3.5, Creatinine 1.17  ICD: Optivol - Fluid well below threshold consistent with stable volume status.  3+ hours of activity daily. No VT/VF.   SH: Married. Lives in [place], 2 children. Working at [employer]. No ETOH or smoking  FH: Mother living: DM2, HTN, no CAD        Father living: DM2, HTN  ROS: All systems negative except as listed in HPI, PMH and Problem  List.  Past Medical History  Diagnosis Date  . Hypertension     currently on Labetalol 200mg  BID  . Infection     UTI;not frequent;last infection was in June  . Infection     Yeast;not frequent  . Asthma     Inhaler prn;triggered by seasonal changes mostly cold weathert;last asthma  attack years ago  . CHF (congestive heart failure) ([place]) 01/2006    Has Pacemaker and Defibrillator;was on heart meds;no longer taking since pregancy  . Anemia     Iron supplements in the past  . Migraines     Can't take Procardia XL, causes migraines  . Varicose veins 1998    Developed during last pregnancy  . Other primary cardiomyopathies   . Atrial fibrillation ([place])   . Paroxysmal ventricular tachycardia ([place])   . CHF (congestive heart failure) ([place])   . Hypertension   . Acute bronchitis and bronchiolitis  Current Outpatient Prescriptions  Medication Sig Dispense Refill  . albuterol (PROVENTIL HFA;VENTOLIN HFA) 108 (90 BASE) MCG/ACT inhaler Inhale 2 puffs into the lungs every 6 (six) hours as needed for wheezing or shortness of breath. Rescue inhaler    . amLODipine (NORVASC) 2.5 MG tablet Take 1 tablet (2.5 mg total) by mouth daily. 30 tablet 3  . carvedilol (COREG) 25 MG tablet TAKE 1 TABLET BY MOUTH 2 TIMES DAILY WITH A MEAL. 60 tablet 6  . cetirizine (ZYRTEC) 10 MG tablet Take 10 mg by mouth daily as needed for allergies or rhinitis.     . hydrALAZINE (APRESOLINE) 50 MG tablet Take 1.5 tablets (75 mg total) by mouth 3 (three) times daily. 135 tablet 3  . isosorbide mononitrate (IMDUR) 60 MG 24 hr tablet TAKE 1 TABLET BY MOUTH DAILY. 30 tablet 3  . pantoprazole (PROTONIX) 40 MG tablet Take 1 tablet (40 mg total) by mouth daily. 30 tablet 3  . potassium chloride SA (K-DUR,KLOR-CON) 20 MEQ tablet TAKE 1 TABLET BY MOUTH 2 TIMES DAILY. 60 tablet 3  . sacubitril-valsartan (ENTRESTO) 97-103 MG Take 1 tablet by mouth 2 (two) times daily. 60 tablet 6  . spironolactone (ALDACTONE) 25 MG tablet TAKE  1/2 TABLET BY MOUTH DAILY 15 tablet 3  . torsemide (DEMADEX) 20 MG tablet TAKE 3 TABS BY MOUTH EVERY MORNING AND 1 TAB EVERY EVENING 120 tablet 6   No current facility-administered medications for this encounter.    Filed Vitals:   09/12/15 1525  BP: 112/78  Pulse: 72  Weight: 257 lb 8 oz (116.801 kg)  SpO2: 95%   Wt Readings from Last 3 Encounters:  09/12/15 257 lb 8 oz (116.801 kg)  08/16/15 250 lb (113.399 kg)  05/15/15 253 lb (114.76 kg)     PHYSICAL EXAM: General: Well appearing; No resp difficulty.  HEENT: normal Neck: supple. JVP 6-7 ; Carotids 2+ bilaterally; no bruits. No lymphadenopathy or thryomegaly appreciated. Cor: PMI normal. Regular rate & rhythm. +s4. No murmurs appreciated Lungs: clear Abdomen: soft, nontender, mildly distended and obese. No hepatosplenomegaly. No bruits or masses. Good bowel sounds. Extremities: no cyanosis, clubbing, rash, tr-1+ edema to knees Neuro: alert & orientedx3, cranial nerves grossly intact. Moves all 4 extremities w/o difficulty. Affect pleasant.  ASSESSMENT & PLAN:  1) Chronic systolic heart failure, NICM s/p ICD likely r/t HTN, last echo 1/16 EF 25-30%. Cath 8/16 EF 35% No CAD - Chronic NYHA II symptoms. Volume status stable on exam and optivol.  - Continue hydralazine 75 mg TID, Imdur 60 mg daily - Continue spiro 12.5 mg daily.  - Continue Entresto 97/103 bid for HTN and HF.   - Reinforced the need and importance of daily weights, a low sodium diet, and fluid restriction (less than 2 L a day). Instructed to call the HF clinic if weight increases more than 3 lbs overnight or 5 lbs in a week.  - Consider ivabradine if HR remains > 70 - Needs Echo.  2) OSA  -  Waiting to get CPAP machine. In talks with Dr. Radford Pax to help afford.  3) HTN - Stable on current regimen. 4) Asthma/chronic bronchitis - Stable. - Has had referral to pulmonary in past but didn't get call back. Will resend. 5) Obesity: - Engineer, drilling.  Encouraged to start with low-impact exercises several times a week. - Encouraged to watch carbs and portions.  6) CP - No further. Resolved on protonix. Cath with no CAD  Get Echo, then see back in  3 months. BMET today.   [name], PA-C 3:26 PM  Patient seen and examined with Oda Kilts, PA-C. We discussed all aspects of the encounter. I agree with the assessment and plan as stated above.   Overall stable. NYHA II. Volume status looks good on exam and on ICD interrogation. No VT/AF on ICD. Congratulated on plans for exercise program. Will continue current meds. Due for repeat echo. Check labs.  Bensimhon, Daniel,MD 6:27 PM

## 2015-09-16 ENCOUNTER — Other Ambulatory Visit (HOSPITAL_COMMUNITY): Payer: Self-pay | Admitting: Internal Medicine

## 2015-09-25 ENCOUNTER — Encounter: Payer: Self-pay | Admitting: *Deleted

## 2015-09-25 ENCOUNTER — Other Ambulatory Visit (HOSPITAL_COMMUNITY): Payer: Self-pay

## 2015-10-04 ENCOUNTER — Institutional Professional Consult (permissible substitution): Payer: Self-pay | Admitting: Emergency Medicine

## 2015-10-07 ENCOUNTER — Encounter: Payer: Self-pay | Admitting: Internal Medicine

## 2015-10-07 ENCOUNTER — Other Ambulatory Visit (INDEPENDENT_AMBULATORY_CARE_PROVIDER_SITE_OTHER): Payer: BLUE CROSS/BLUE SHIELD

## 2015-10-07 ENCOUNTER — Ambulatory Visit (INDEPENDENT_AMBULATORY_CARE_PROVIDER_SITE_OTHER): Payer: BLUE CROSS/BLUE SHIELD | Admitting: Internal Medicine

## 2015-10-07 ENCOUNTER — Ambulatory Visit (INDEPENDENT_AMBULATORY_CARE_PROVIDER_SITE_OTHER)
Admission: RE | Admit: 2015-10-07 | Discharge: 2015-10-07 | Disposition: A | Discharge status: Home / Self Care | Payer: BLUE CROSS/BLUE SHIELD | Source: Ambulatory Visit | Attending: Internal Medicine | Admitting: Internal Medicine

## 2015-10-07 VITALS — BP 118/86 | HR 80 | Ht 61.5 in | Wt 262.2 lb

## 2015-10-07 DIAGNOSIS — R0600 Dyspnea, unspecified: Secondary | ICD-10-CM | POA: Diagnosis not present

## 2015-10-07 DIAGNOSIS — G4733 Obstructive sleep apnea (adult) (pediatric): Secondary | ICD-10-CM

## 2015-10-07 DIAGNOSIS — D509 Iron deficiency anemia, unspecified: Secondary | ICD-10-CM | POA: Diagnosis not present

## 2015-10-07 LAB — BRAIN NATRIURETIC PEPTIDE: PRO B NATRI PEPTIDE: 40 pg/mL (ref 0.0–100.0)

## 2015-10-07 LAB — CBC WITH DIFFERENTIAL/PLATELET
BASOS ABS: 0 10*3/uL (ref 0.0–0.1)
Basophils Relative: 0.2 % (ref 0.0–3.0)
Eosinophils Absolute: 0.3 10*3/uL (ref 0.0–0.7)
Eosinophils Relative: 3 % (ref 0.0–5.0)
HEMATOCRIT: 34.1 % — AB (ref 36.0–46.0)
HEMOGLOBIN: 10.9 g/dL — AB (ref 12.0–15.0)
Lymphocytes Relative: 23.7 % (ref 12.0–46.0)
Lymphs Abs: 2.1 10*3/uL (ref 0.7–4.0)
MCHC: 32 g/dL (ref 30.0–36.0)
MCV: 76.3 fl — AB (ref 78.0–100.0)
MONOS PCT: 6.7 % (ref 3.0–12.0)
Monocytes Absolute: 0.6 10*3/uL (ref 0.1–1.0)
NEUTROS ABS: 5.8 10*3/uL (ref 1.4–7.7)
Neutrophils Relative %: 66.4 % (ref 43.0–77.0)
PLATELETS: 295 10*3/uL (ref 150.0–400.0)
RBC: 4.47 Mil/uL (ref 3.87–5.11)
RDW: 17.8 % — ABNORMAL HIGH (ref 11.5–15.5)
WBC: 8.8 10*3/uL (ref 4.0–10.5)

## 2015-10-07 LAB — TSH: TSH: 2.15 u[IU]/mL (ref 0.35–4.50)

## 2015-10-07 MED ORDER — MOMETASONE FURO-FORMOTEROL FUM 100-5 MCG/ACT IN AERO: INHALATION_SPRAY | RESPIRATORY_TRACT | Status: DC

## 2015-10-07 NOTE — Assessment & Plan Note (Addendum)
Symptoms are markedly disproportionate to objective findings and not clear this is a lung problem but pt does appear to have difficult airway management issues. DDX of  difficult airways management almost all start with A and  include Adherence, Ace Inhibitors, Acid Reflux, Active Sinus Disease, Alpha 1 Antitripsin deficiency, Anxiety masquerading as Airways dz,  ABPA,  Allergy(esp in young), Aspiration (esp in elderly), Adverse effects of meds,  Active smokers, A bunch of PE's (a small clot burden can't cause this syndrome unless there is already severe underlying pulm or vascular dz with poor reserve) plus two Bs  = Bronchiectasis and Beta blocker use..and one C= CHF   In this case Adherence is the biggest issue and starts with  inability to  understand that SABA treats the symptoms but doesn't get to the underlying problem (inflammation).  I used  the analogy of putting steroid cream on a rash to help explain the meaning of topical therapy and the need to get the drug to the target tissue.  - The proper method of use, as well as anticipated side effects, of a metered-dose inhaler are discussed and demonstrated to the patient. Improved effectiveness after extensive coaching during this visit to a level of approximately 90 % from a baseline of 75 % > try dulera 100 2bid  ? Acid (or non-acid) GERD > always difficult to exclude as up to 75% of pts in some series report no assoc GI/ Heartburn symptoms and some of her noct symptoms are suggestive > rec max (24h)  acid suppression and diet restrictions/ reviewed and instructions given in writing.   ? Allergy/ asthma > many of symptoms are typical so will first try low dose ics in form of dulera 100 2bid  ? BB effects > Strongly prefer in this setting: Bystolic, the most beta -1  selective Beta blocker available in sample form, with bisoprolol the most selective generic choice  on the market. Since not definitely refractory to B2 can hold off for now  ? CHF >  note unlikely active issue as bnp << 100 but rec  keep as dry as tolerates per chf clinic   Total time devoted to counseling  = 35/64m review case with pt/ discussion of options/alternatives/ personally creating in presence of pt  then going over specific  Instructions directly with the pt including how to use all of the meds but in particular covering each new medication in detail (see avs)

## 2015-10-07 NOTE — Assessment & Plan Note (Signed)
Body mass index is 48.75    Lab Results  Component Value Date   TSH 1.220 12/08/2013     Contributing to gerd tendency/ doe/reviewed the need and the process to achieve and maintain neg calorie balance > defer f/u primary care including intermittently monitoring thyroid status

## 2015-10-07 NOTE — Progress Notes (Signed)
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 in [place] then one year after birth of oldest child in Summer 98 which would have been 1999 developed itching/rhinitis/wheezing > transient improvement only > eval by [facility]/young Pos seasonal allergies but no shots,rx inhalers = saba helped the most then got more albuterol when that didn't work (neb) then 2007 dx as chf and some better but continue neb up to 4 x daily and not a lot better but lost nebulizer when moved  spring 2016 and actually fine s them  But  by early Jan 2017  still twice daily albuterol hfa so referred to pulmonary clinic 10/07/2015 by Dr Tempie Hoist with increased sob/noct cough despite optimal rx of chf.    10/07/2015 1st [facility] office visit/ Ynez Eugenio   Chief Complaint  Patient presents with  . Pulmonary Consult    Referred by Dr. Haroldine Laws.  Pt c/o SOB for the past month.  She states she gets winded walking from room to room.  She also c/o chest congestion and cough with yellow green sputum.    indolent onset progressive doe x 1.5 month  Plus newly worse hs with  Cough/gagging/  and albuterol  Relieves x 2h then repeats pattern  Dx with osa in 2001 and mostly on cpap until 2 y ago and none now but to be delivered in a week  Has overt hb on ppi but does not tol well  Assoc nasal congestion as well   No obvious  patterns in day to day or daytime variabilty or assoc  cp or chest tightness, subjective wheeze . No unusual exp hx or h/o childhood pna/ asthma or knowledge of premature birth.  . Also denies any obvious fluctuation of symptoms with weather or environmental changes or other aggravating or alleviating factors except as outlined above   Current Medications, Allergies, Complete Past Medical History, Past Surgical History, Family History, and Social History were reviewed in [facility]  record.                 Review of Systems  Constitutional: Negative for fever, chills and unexpected weight change.  HENT: Positive for congestion and sore throat. Negative for dental problem, ear pain, nosebleeds, postnasal drip, rhinorrhea, sinus pressure, sneezing, trouble swallowing and voice change.   Eyes: Negative for visual disturbance.  Respiratory: Positive for cough and shortness of breath. Negative for choking.   Cardiovascular: Positive for chest pain and leg swelling.  Gastrointestinal: Negative for vomiting, abdominal pain and diarrhea.  Genitourinary: Negative for difficulty urinating.       Acid heartburn  Musculoskeletal: Negative for arthralgias.  Skin: Negative for rash.  Neurological: Negative for tremors, syncope and headaches.  Hematological: Does not bruise/bleed easily.       Objective:   Physical Exam  amb obese bf nad  Wt Readings from Last 3 Encounters:  10/07/15 262 lb 3.2 oz (118.933 kg)  09/12/15 257 lb 8 oz (116.801 kg)  08/16/15 250 lb (113.399 kg)    Vital signs reviewed   HEENT: nl dentition, turbinates, and oropharynx. Nl external ear canals without cough reflex   NECK :  without JVD/Nodes/TM/ nl carotid upstrokes bilaterally   LUNGS: no acc muscle use,  Nl contour chest which is clear to A and P bilaterally without cough on insp or exp maneuvers  CV:  RRR  no s3 or murmur or increase in P2,  Trace to 1+ pitting bilateral lower ext  edema   ABD:  soft and nontender with nl inspiratory excursion in the supine position. No bruits or organomegaly, bowel sounds nl  MS:  Nl gait/ ext warm without deformities, calf tenderness, cyanosis or clubbing No obvious joint restrictions   SKIN: warm and dry without lesions    NEURO:  alert, approp, nl sensorium with  no motor deficits    CXR PA and Lateral:   10/07/2015 :    I personally reviewed images and agree with radiology impression as follows:    cardiogmegaly, decreased lung  volumes    Labs ordered/ reviewed:      Chemistry      Component Value Date/Time   NA 140 09/12/2015 1615   K 3.7 09/12/2015 1615   CL 101 09/12/2015 1615   CO2 29 09/12/2015 1615   BUN 10 09/12/2015 1615   CREATININE 1.22* 09/12/2015 1615   CREATININE 1.17* 06/23/2012 0700      Component Value Date/Time   CALCIUM 8.7* 09/12/2015 1615   ALKPHOS 60 12/08/2013 0418   AST 23 12/08/2013 0418   ALT 22 12/08/2013 0418   BILITOT 1.0 12/08/2013 0418        Lab Results  Component Value Date   WBC 8.8 10/07/2015   HGB 10.9* 10/07/2015   HCT 34.1* 10/07/2015   MCV 76.3* 10/07/2015   PLT 295.0 10/07/2015      Lab Results  Component Value Date   TSH 2.15 10/07/2015     Lab Results  Component Value Date   PROBNP 40.0 10/07/2015             Assessment & Plan:

## 2015-10-07 NOTE — Patient Instructions (Addendum)
Plan A = Automatic = Dulera 100 Take 2 puffs first thing in am and then another 2 puffs about 12 hours later - if not happy with sample or insurance coverage please call    Plan B = Backup Only use your albuterol (proair) as a rescue medication to be used if you can't catch your breath by resting or doing a relaxed purse lip breathing pattern.  - The less you use it, the better it will work when you need it. - Ok to use up to 2 puffs  every 4 hours if you must but call for appointment if use goes up over your usual need - Don't leave home without it !!  (think of it like the spare tire for your car)    Stop the protonix and take the zantac 150 mg after bfast and supper until return  GERD (REFLUX)  is an extremely common cause of respiratory symptoms just like yours , many times with no obvious heartburn at all.    It can be treated with medication, but also with lifestyle changes including elevation of the head of your bed (ideally with 6 inch  bed blocks),  Smoking cessation, avoidance of late meals, excessive alcohol, and avoid fatty foods, chocolate, peppermint, colas, red wine, and acidic juices such as orange juice.  NO MINT OR MENTHOL PRODUCTS SO NO COUGH DROPS  USE SUGARLESS CANDY INSTEAD (Jolley ranchers or Stover's or Life Savers) or even ice chips will also do - the key is to swallow to prevent all throat clearing. NO OIL BASED VITAMINS - use powdered substitutes.    Please remember to go to the lab and x-ray department downstairs for your tests - we will call you with the results when they are available.     Please schedule a follow up office visit in 6 weeks, call sooner if needed with full PFTs ok to push back if needed   Late add rec nuiron 150 mg daily

## 2015-10-08 ENCOUNTER — Ambulatory Visit (HOSPITAL_COMMUNITY): Payer: BLUE CROSS/BLUE SHIELD | Attending: Internal Medicine

## 2015-10-08 ENCOUNTER — Other Ambulatory Visit: Payer: Self-pay

## 2015-10-08 ENCOUNTER — Telehealth: Payer: Self-pay | Admitting: *Deleted

## 2015-10-08 DIAGNOSIS — I472 Ventricular tachycardia: Secondary | ICD-10-CM | POA: Insufficient documentation

## 2015-10-08 DIAGNOSIS — R001 Bradycardia, unspecified: Secondary | ICD-10-CM | POA: Diagnosis not present

## 2015-10-08 DIAGNOSIS — I110 Hypertensive heart disease with heart failure: Secondary | ICD-10-CM | POA: Insufficient documentation

## 2015-10-08 DIAGNOSIS — I509 Heart failure, unspecified: Secondary | ICD-10-CM | POA: Diagnosis present

## 2015-10-08 DIAGNOSIS — I5022 Chronic systolic (congestive) heart failure: Secondary | ICD-10-CM | POA: Insufficient documentation

## 2015-10-08 DIAGNOSIS — G4733 Obstructive sleep apnea (adult) (pediatric): Secondary | ICD-10-CM | POA: Diagnosis not present

## 2015-10-08 LAB — RESPIRATORY ALLERGY PROFILE REGION II ~~LOC~~
Allergen, Cedar tree, t12: <0.1 kU/L
Allergen, Comm Silver Birch, t9: <0.1 kU/L
Allergen, Cottonwood, t14: <0.1 kU/L
Allergen, Mouse Urine Protein, e78: <0.1 kU/L
Allergen, Mulberry, t76: <0.1 kU/L
Alternaria Alternata: <0.1 kU/L
Aspergillus fumigatus, m3: <0.1 kU/L
Bermuda Grass: 3.47 kU/L — ABNORMAL HIGH
Box Elder IgE: <0.1 kU/L
Cladosporium Herbarum: <0.1 kU/L
Cockroach: <0.1 kU/L
Common Ragweed: <0.1 kU/L
IgE (Immunoglobulin E), Serum: 39 kU/L (ref <115)
JOHNSON GRASS: 4.79 kU/L — AB
Pecan/Hickory Tree IgE: <0.1 kU/L
Penicillium Notatum: <0.1 kU/L
Rough Pigweed  IgE: <0.1 kU/L
SHEEP SORREL IGE: 0.18 kU/L — AB
Timothy Grass: 9.87 kU/L — ABNORMAL HIGH

## 2015-10-08 MED ORDER — FERROUS SULFATE 325 (65 FE) MG PO TABS: 325.0000 mg | ORAL_TABLET | Freq: Three times a day (TID) | ORAL | Status: DC

## 2015-10-08 NOTE — Assessment & Plan Note (Signed)
Split night 08/19/15 : Severe OSA with AHI 74/hr with oxygen desaturations down to a nadir of 75%.  Successfufl CPAP titration to 14cm H2O with time spent with O2 sats < 88% at 4 minutes on CPAP    Her noct symptoms sound more like gerd than osa but certainly she needs to start the cpap asap based on these findings

## 2015-10-08 NOTE — Telephone Encounter (Signed)
Pt is aware that she needs this medication.  MW - please advise, Nulron is not in the system. Thanks.

## 2015-10-08 NOTE — Telephone Encounter (Signed)
LMTC x `1 for pt 

## 2015-10-08 NOTE — Telephone Encounter (Signed)
[id number] 325 mg tid x 3 months

## 2015-10-08 NOTE — Telephone Encounter (Signed)
LMTCB

## 2015-10-08 NOTE — Progress Notes (Signed)
Quick Note:  msg already left for her tcb ______

## 2015-10-08 NOTE — Telephone Encounter (Signed)
Patient returned call, CB is [phone].

## 2015-10-08 NOTE — Telephone Encounter (Signed)
-----   Message from Tanda Rockers, MD sent at 10/08/2015  6:36 AM EDT ----- rec NuIron 150 mg daily - if insurance doesn't cover then [id number] 325 mg tid x 3 months rx

## 2015-10-08 NOTE — Assessment & Plan Note (Addendum)
Apparently dates back to 2013 when was placed on Fe and May be contributing to sob/ not good for pt with chf for sure. At age 40 most likely from menses so rec Fe restart     Lab Results  Component Value Date   HGB 10.9* 10/07/2015   HGB 11.4* 05/05/2015   HGB 10.8* 05/04/2015

## 2015-10-08 NOTE — Telephone Encounter (Signed)
[place]  X 1 for pt.

## 2015-10-08 NOTE — Telephone Encounter (Signed)
Spoke with pt, aware of med recs.  rx sent to preferred pharmacy.  Nothing further needed.

## 2015-10-09 NOTE — Progress Notes (Signed)
Quick Note:  LVM for pt to return call ______ 

## 2015-10-16 ENCOUNTER — Other Ambulatory Visit (HOSPITAL_COMMUNITY): Payer: Self-pay

## 2015-10-17 ENCOUNTER — Other Ambulatory Visit (HOSPITAL_COMMUNITY): Payer: Self-pay | Admitting: *Deleted

## 2015-10-17 MED ORDER — ISOSORBIDE MONONITRATE ER 60 MG PO TB24: 60.0000 mg | ORAL_TABLET | Freq: Every day | ORAL | Status: DC

## 2015-10-17 MED ORDER — AMLODIPINE BESYLATE 2.5 MG PO TABS: 2.5000 mg | ORAL_TABLET | Freq: Every day | ORAL | Status: DC

## 2015-10-17 MED FILL — ISOSORBIDE MN ER 60 MG TAB: 60 | 30 days supply | Qty: 30 | Fill #0

## 2015-10-17 MED FILL — AMLODIPINE BESYLATE 2.5 MG: 2.5 | 30 days supply | Qty: 30 | Fill #0

## 2015-10-17 MED FILL — SPIRONOLACTONE 25 MG TABLET: 25 | 30 days supply | Qty: 15 | Fill #3

## 2015-10-22 ENCOUNTER — Other Ambulatory Visit (HOSPITAL_COMMUNITY)
Admission: RE | Admit: 2015-10-22 | Discharge: 2015-10-22 | Disposition: A | Discharge status: Home / Self Care | Payer: BLUE CROSS/BLUE SHIELD | Source: Ambulatory Visit | Attending: Family Medicine | Admitting: Family Medicine

## 2015-10-22 ENCOUNTER — Other Ambulatory Visit: Payer: Self-pay | Admitting: Family Medicine

## 2015-10-22 DIAGNOSIS — Z1151 Encounter for screening for human papillomavirus (HPV): Secondary | ICD-10-CM | POA: Diagnosis not present

## 2015-10-22 DIAGNOSIS — Z01411 Encounter for gynecological examination (general) (routine) with abnormal findings: Secondary | ICD-10-CM | POA: Diagnosis present

## 2015-10-23 LAB — CYTOLOGY - PAP

## 2015-10-24 ENCOUNTER — Telehealth (HOSPITAL_COMMUNITY): Payer: Self-pay

## 2015-10-24 NOTE — Telephone Encounter (Signed)
If she is orthostatic should hold Torsemide and K x 2 days. No torsemide tonight, Friday, or Saturday.  Resume Sunday. Will see in clinic 10/29/15    Beryle Beams" Malone, PA-C 10/24/2015 4:20 PM

## 2015-10-24 NOTE — Telephone Encounter (Signed)
Patient called with concerns regarding increased fatigue and increased SOB Patient reports she has recently developed a cough Weight stable at 255  Reports b/p was "dropping" at PCP visit today Requested an appointment as PCP would not make any changes and advised her to follow up with CHF clinic  Patient given next available 4/4 @ 9am  However should patient hold/ adjust meds until she is seen in office

## 2015-10-24 NOTE — Telephone Encounter (Signed)
Ambulance person at [facility] called to report patient has some positional dizziness that was noted during recent new patient physical visit.  Orthostatics Laying 97/59 Standing 77/46  Denies SOB, CP, pre sync/sync. Patient's weight stable, no edema noted, compliant with med regimen. Will forward to CHF provider that last saw patient in clinic to see if any adjustments need to be made to patient's medications.  Renee Pain

## 2015-10-25 ENCOUNTER — Other Ambulatory Visit: Payer: Self-pay | Admitting: Family Medicine

## 2015-10-25 ENCOUNTER — Telehealth (HOSPITAL_COMMUNITY): Payer: Self-pay

## 2015-10-25 DIAGNOSIS — R109 Unspecified abdominal pain: Secondary | ICD-10-CM

## 2015-10-25 NOTE — Telephone Encounter (Signed)
Attempted to reach staff at [facility] to update on recent med changes for patient. NO answer after being on hold for 15 minutes. Will attempt to call Monday with update.  Renee Pain

## 2015-10-25 NOTE — Telephone Encounter (Signed)
Patient aware via husband

## 2015-10-29 ENCOUNTER — Ambulatory Visit (HOSPITAL_COMMUNITY)
Admission: RE | Admit: 2015-10-29 | Discharge: 2015-10-29 | Disposition: A | Discharge status: Home / Self Care | Payer: BLUE CROSS/BLUE SHIELD | Source: Ambulatory Visit | Attending: Cardiology | Admitting: Cardiology

## 2015-10-29 ENCOUNTER — Encounter (HOSPITAL_COMMUNITY): Payer: Self-pay

## 2015-10-29 VITALS — BP 118/76 | HR 74 | Wt 261.2 lb

## 2015-10-29 DIAGNOSIS — R0600 Dyspnea, unspecified: Secondary | ICD-10-CM | POA: Diagnosis not present

## 2015-10-29 DIAGNOSIS — Z833 Family history of diabetes mellitus: Secondary | ICD-10-CM | POA: Insufficient documentation

## 2015-10-29 DIAGNOSIS — J42 Unspecified chronic bronchitis: Secondary | ICD-10-CM

## 2015-10-29 DIAGNOSIS — I5022 Chronic systolic (congestive) heart failure: Secondary | ICD-10-CM | POA: Insufficient documentation

## 2015-10-29 DIAGNOSIS — I428 Other cardiomyopathies: Secondary | ICD-10-CM | POA: Insufficient documentation

## 2015-10-29 DIAGNOSIS — E669 Obesity, unspecified: Secondary | ICD-10-CM | POA: Diagnosis not present

## 2015-10-29 DIAGNOSIS — G4733 Obstructive sleep apnea (adult) (pediatric): Secondary | ICD-10-CM | POA: Insufficient documentation

## 2015-10-29 DIAGNOSIS — Z95810 Presence of automatic (implantable) cardiac defibrillator: Secondary | ICD-10-CM | POA: Insufficient documentation

## 2015-10-29 DIAGNOSIS — I110 Hypertensive heart disease with heart failure: Secondary | ICD-10-CM | POA: Insufficient documentation

## 2015-10-29 DIAGNOSIS — J449 Chronic obstructive pulmonary disease, unspecified: Secondary | ICD-10-CM | POA: Diagnosis not present

## 2015-10-29 DIAGNOSIS — Z8249 Family history of ischemic heart disease and other diseases of the circulatory system: Secondary | ICD-10-CM | POA: Diagnosis not present

## 2015-10-29 DIAGNOSIS — J069 Acute upper respiratory infection, unspecified: Secondary | ICD-10-CM | POA: Diagnosis not present

## 2015-10-29 DIAGNOSIS — Z79899 Other long term (current) drug therapy: Secondary | ICD-10-CM | POA: Diagnosis not present

## 2015-10-29 DIAGNOSIS — I5032 Chronic diastolic (congestive) heart failure: Secondary | ICD-10-CM | POA: Insufficient documentation

## 2015-10-29 LAB — BASIC METABOLIC PANEL
Anion gap: 8 (ref 5–15)
BUN: 12 mg/dL (ref 6–20)
CO2: 25 mmol/L (ref 22–32)
Calcium: 8.5 mg/dL — ABNORMAL LOW (ref 8.9–10.3)
Chloride: 109 mmol/L (ref 101–111)
Creatinine, Ser: 1.08 mg/dL — ABNORMAL HIGH (ref 0.44–1.00)
GFR calc Af Amer: >60 mL/min (ref >60)
GLUCOSE: 129 mg/dL — AB (ref 65–99)
POTASSIUM: 4 mmol/L (ref 3.5–5.1)
Sodium: 142 mmol/L (ref 135–145)

## 2015-10-29 LAB — BRAIN NATRIURETIC PEPTIDE: B Natriuretic Peptide: 53.2 pg/mL (ref 0.0–100.0)

## 2015-10-29 MED ORDER — AZITHROMYCIN 250 MG PO TABS: ORAL_TABLET | ORAL | Status: DC

## 2015-10-29 MED ORDER — POTASSIUM CHLORIDE CRYS ER 20 MEQ PO TBCR: 20.0000 meq | EXTENDED_RELEASE_TABLET | Freq: Two times a day (BID) | ORAL | Status: DC

## 2015-10-29 MED ORDER — TORSEMIDE 20 MG PO TABS: ORAL_TABLET | ORAL | Status: DC

## 2015-10-29 MED FILL — AZITHROMYCIN 250 MG TABLET: 250 | 5 days supply | Qty: 6 | Fill #0

## 2015-10-29 NOTE — Patient Instructions (Signed)
Restart Torsemide.  Restart Potassium.  Keep your next scheduled appointment.   Routine lab work today. Will notify you of abnormal results

## 2015-10-29 NOTE — Progress Notes (Signed)
Patient ID: [name], female   DOB: 08-05-1975, 40 y.o.   MRN: [id number]    Advanced Heart Failure Clinic Note   PCP: Dr. Heath Gold  OB:  Dr. Leo Grosser  EP: Dr. Lovena Le CHF: [name]  HPI: [name] is a 40 year old woman with h/o obesity, chronic systolic heart failure due to nonischemic cardiomyopathy (cath 2007. No CAD) s/p [employer] ICD implantation, asthma/chronic bronchitis and severe hypertension.   ECHOs 04/27/2011 45-50%.   08/20/11 ECHO 55-60%   05/19/12 EF ~40%   07/01/12 EF ~40%   08/02/12 EF~40%    2/14 EF ~50%                          5/14 EF ~35%                          1/16 EF 25-30%   3/17 EF 55-60%                           CPX 01/2014: Resting HR 75, Peak HR 121 Peak VO2 15.2, 74.4% predicted VE/VCO2 slope: 26.4 OUES: 1.93 Peak RER 1.15 VE/MVV 61.7%  Follow up for Heart Failure:  Has had echo since last visit, EF now low normal (55-60%). Has been feeling bad for about a month.  Has had sinus pressure, thick phlegm from [name] nose, and post-nasal drip.  Held torsemide over weekend with orthostasis. Just got [name] CPAP today, has not started yet.  Used [name] rescue inhaler yesterday. Has been wheezing occasionally. Weight at home 260 lbs. She is up 4 lbs from [name] last visit.  Has creeped back up over the last few days. Breathing has been OK for the most part, but worse over the last few days.  Watches [name] fluid and diet.  Still exercising, walks on treadmill for 45 minutes at 3.0.   LABS: 01/03/14: K 4.2 Cr 1.27 pBNP 1766 (down from 4070)            02/13/14: K 4.4, creatinine 1.27            05/15/14: K 4.0, Creatinine 1.36  08/29/14: K 3.5, Creatinine 1.17  ICD: Optivol - Thoracic impendence trending now, no optivol crossings. Pt activity ~ 3 hrs a day.  No AF/AT alerts.   SH: Married. Lives in [place], 2 children. Working at [employer]. No ETOH or smoking  FH: Mother living: DM2, HTN, no CAD        Father living: DM2, HTN  ROS: All systems negative except as listed  in HPI, PMH and Problem List.  Past Medical History  Diagnosis Date  . Hypertension     currently on Labetalol 200mg  BID  . Infection     UTI;not frequent;last infection was in June  . Infection     Yeast;not frequent  . Asthma     Inhaler prn;triggered by seasonal changes mostly cold weathert;last asthma  attack years ago  . CHF (congestive heart failure) ([place]) 01/2006    Has Pacemaker and Defibrillator;was on heart meds;no longer taking since pregancy  . Anemia     Iron supplements in the past  . Migraines     Can't take Procardia XL, causes migraines  . Varicose veins 1998    Developed during last pregnancy  . Other primary cardiomyopathies   . Atrial fibrillation ([place])   . Paroxysmal ventricular tachycardia ([place])   . CHF (congestive  heart failure) ([place])   . Hypertension   . Acute bronchitis and bronchiolitis     Current Outpatient Prescriptions  Medication Sig Dispense Refill  . albuterol (PROVENTIL HFA;VENTOLIN HFA) 108 (90 Base) MCG/ACT inhaler Inhale 2 puffs into the lungs every 6 (six) hours as needed for wheezing or shortness of breath. Rescue inhaler 1 Inhaler 1  . amLODipine (NORVASC) 2.5 MG tablet Take 1 tablet (2.5 mg total) by mouth daily. 30 tablet 6  . carvedilol (COREG) 25 MG tablet TAKE 1 TABLET BY MOUTH 2 TIMES DAILY WITH A MEAL. 60 tablet 6  . cetirizine (ZYRTEC) 10 MG tablet Take 10 mg by mouth daily as needed for allergies or rhinitis.     . ferrous sulfate 325 (65 FE) MG tablet Take 1 tablet (325 mg total) by mouth 3 (three) times daily with meals. 90 tablet 3  . hydrALAZINE (APRESOLINE) 50 MG tablet Take 1.5 tablets (75 mg total) by mouth 3 (three) times daily. 135 tablet 6  . isosorbide mononitrate (IMDUR) 60 MG 24 hr tablet Take 1 tablet (60 mg total) by mouth daily. 30 tablet 6  . mometasone-formoterol (DULERA) 100-5 MCG/ACT AERO Take 2 puffs first thing in am and then another 2 puffs about 12 hours later. 1 Inhaler 11  . potassium chloride SA  (K-DUR,KLOR-CON) 20 MEQ tablet TAKE 1 TABLET BY MOUTH 2 TIMES DAILY. 60 tablet 6  . sacubitril-valsartan (ENTRESTO) 97-103 MG Take 1 tablet by mouth 2 (two) times daily. 60 tablet 6  . spironolactone (ALDACTONE) 25 MG tablet TAKE 1/2 TABLET BY MOUTH DAILY 15 tablet 3  . torsemide (DEMADEX) 20 MG tablet TAKE 3 TABS BY MOUTH EVERY MORNING AND 1 TAB EVERY EVENING 120 tablet 6   No current facility-administered medications for this encounter.    Filed Vitals:   10/29/15 0904  BP: 118/76  Pulse: 74  Weight: 261 lb 4 oz (118.502 kg)  SpO2: 98%   Wt Readings from Last 3 Encounters:  10/29/15 261 lb 4 oz (118.502 kg)  10/07/15 262 lb 3.2 oz (118.933 kg)  09/12/15 257 lb 8 oz (116.801 kg)     PHYSICAL EXAM: General: Well appearing; No resp difficulty.  HEENT: normal Neck: supple. JVP 7-8; Carotids 2+ bilaterally; no bruits. No thyromegaly or nodule noted Cor: PMI normal. Regular rate & rhythm. +s4. No murmurs appreciated Lungs: Bibasilar crackles, tight lung sounds.  Abdomen: soft, NT, ND, no HSM. No bruits or masses. +BS  Extremities: no cyanosis, clubbing, rash, 1+ edema to knees Neuro: alert & orientedx3, cranial nerves grossly intact. Moves all 4 extremities w/o difficulty. Affect pleasant.  ASSESSMENT & PLAN:  1) Chronic diastolic heart failure, NICM s/p ICD likely r/t HTN, Echo 09/2015 EF now low normal 55-60%, improved from echo 1/16 EF 25-30%. Cath 8/16 EF 35% No CAD - Chronic NYHA II symptoms. Volume status mildly elevated on exam and optivol.  - Resume torsemide at 60 mg q am and 20 mg q pm. Resume potassium at 20 mg BID.  - Continue hydralazine 75 mg TID, Imdur 60 mg daily - Continue spiro 12.5 mg daily.  - Continue Entresto 97/103 bid for HTN and HF.   - Reinforced the need and importance of daily weights, a low sodium diet, and fluid restriction (less than 2 L a day). Instructed to call the HF clinic if weight increases more than 3 lbs overnight or 5 lbs in a week.  -  Consider ivabradine if HR remains > 70 2) OSA  -  Split night 08/19/15 Severe OSA with AHI 74/hr with 02 desats to nadir of 75%. - Needs to wear CPAP nightly 3) HTN - Stable on current regimen. 4) Asthma/chronic bronchitis - Stable. 5) Obesity: - Engineer, drilling. Encouraged to start with low-impact exercises several times a week. - Encouraged to watch carbs and portions.  6) CP - No further. Resolved on protonix. Cath with no CAD 7) URI - She states azithromycin has worked well for [name] in the past. Will give [name] one today, and follow up with PCP.   BMET, BNP today.  Keep [name] 6 week follow up with MD.  Further URI follow up with PCP. She was negative for orthostatics in office today. Restarting [name] torsemide as above.   [name], PA-C 9:06 AM

## 2015-10-29 NOTE — Progress Notes (Signed)
Advanced Heart Failure Medication Review by a Pharmacist  Does the patient  feel that his/her medications are working for him/her?  yes  Has the patient been experiencing any side effects to the medications prescribed?  no  Does the patient measure his/her own blood pressure or blood glucose at home?  yes   Does the patient have any problems obtaining medications due to transportation or finances?   no  Understanding of regimen: excellent Understanding of indications: excellent Potential of compliance: excellent Patient understands to avoid NSAIDs. Patient understands to avoid decongestants.   Pharmacist comments: All morning medications taken prior to visit. Reports KCl and torsemide held since 03/30 per phone call to clinic due to hypotension. Reports some weight gain since then. Expresses no issues or concerns otherwise. Denies issues with accessibility to medications.   Time with patient: 10 min Preparation and documentation time: 5 min Total time: 15 min

## 2015-10-30 ENCOUNTER — Ambulatory Visit
Admission: RE | Admit: 2015-10-30 | Discharge: 2015-10-30 | Disposition: A | Discharge status: Home / Self Care | Payer: BLUE CROSS/BLUE SHIELD | Source: Ambulatory Visit | Attending: Family Medicine | Admitting: Family Medicine

## 2015-10-30 DIAGNOSIS — R109 Unspecified abdominal pain: Secondary | ICD-10-CM

## 2015-11-06 ENCOUNTER — Ambulatory Visit (INDEPENDENT_AMBULATORY_CARE_PROVIDER_SITE_OTHER): Payer: BLUE CROSS/BLUE SHIELD | Admitting: *Deleted

## 2015-11-06 DIAGNOSIS — I119 Hypertensive heart disease without heart failure: Secondary | ICD-10-CM | POA: Diagnosis not present

## 2015-11-06 DIAGNOSIS — Z95810 Presence of automatic (implantable) cardiac defibrillator: Secondary | ICD-10-CM

## 2015-11-06 DIAGNOSIS — I43 Cardiomyopathy in diseases classified elsewhere: Secondary | ICD-10-CM

## 2015-11-06 NOTE — Progress Notes (Signed)
Remote ICD transmission.   

## 2015-11-18 ENCOUNTER — Encounter: Payer: Self-pay | Admitting: Internal Medicine

## 2015-11-18 ENCOUNTER — Other Ambulatory Visit (HOSPITAL_COMMUNITY): Payer: Self-pay | Admitting: Cardiology

## 2015-11-18 ENCOUNTER — Ambulatory Visit (INDEPENDENT_AMBULATORY_CARE_PROVIDER_SITE_OTHER): Payer: BLUE CROSS/BLUE SHIELD | Admitting: Internal Medicine

## 2015-11-18 VITALS — BP 114/84 | HR 80 | Ht 61.5 in | Wt 256.0 lb

## 2015-11-18 DIAGNOSIS — J45991 Cough variant asthma: Secondary | ICD-10-CM

## 2015-11-18 DIAGNOSIS — D500 Iron deficiency anemia secondary to blood loss (chronic): Secondary | ICD-10-CM

## 2015-11-18 DIAGNOSIS — I119 Hypertensive heart disease without heart failure: Secondary | ICD-10-CM

## 2015-11-18 DIAGNOSIS — R0600 Dyspnea, unspecified: Secondary | ICD-10-CM | POA: Diagnosis not present

## 2015-11-18 DIAGNOSIS — I43 Cardiomyopathy in diseases classified elsewhere: Secondary | ICD-10-CM

## 2015-11-18 MED ORDER — MOMETASONE FURO-FORMOTEROL FUM 100-5 MCG/ACT IN AERO: INHALATION_SPRAY | RESPIRATORY_TRACT | Status: DC

## 2015-11-18 MED ORDER — AMOXICILLIN-POT CLAVULANATE 875-125 MG PO TABS
1.0000 | ORAL_TABLET | Freq: Two times a day (BID) | ORAL | Status: DC
Start: 2015-11-18 — End: 2015-12-11

## 2015-11-18 MED ORDER — FLUTTER DEVI: Status: DC

## 2015-11-18 MED ORDER — MEPERIDINE HCL 50 MG PO TABS: 50.0000 mg | ORAL_TABLET | ORAL | Status: DC | PRN

## 2015-11-18 MED ORDER — PREDNISONE 10 MG PO TABS: ORAL_TABLET | ORAL | Status: DC

## 2015-11-18 MED FILL — AMLODIPINE BESYLATE 2.5 MG: 2.5 | 30 days supply | Qty: 30 | Fill #1

## 2015-11-18 MED FILL — ISOSORBIDE MN ER 60 MG TAB: 60 | 30 days supply | Qty: 30 | Fill #1

## 2015-11-18 NOTE — Patient Instructions (Addendum)
Continue duelra 100  Take 2 puffs first thing in am and then another 2 puffs about 12 hours later.    For cough > mucinex 1200 mg every 12 hours with flutter valve as possible and if this fails >  Demerol 50 mg every 4 hours (will make you sleepy)  Augmentin 875 mg take one pill twice daily  X 10 days - take at breakfast and supper with large glass of water.  It would help reduce the usual side effects (diarrhea and yeast infections) if you ate cultured yogurt at lunch.   Prednisone 10 mg take  4 each am x 2 days,   2 each am x 2 days,  1 each am x 2 days and stop   I will send Dr Tempie Hoist a copy of this note ? Ok to change coreg to bisoprolol so your asthma medications work better   Please schedule a follow up office visit in 4 weeks, sooner if needed

## 2015-11-18 NOTE — Progress Notes (Signed)
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 in [place] then one year after birth of oldest child in Summer 98 which would have been 1999 developed itching/rhinitis/wheezing > transient improvement only > eval by [facility] seasonal allergies but no shots,rx inhalers = saba helped the most then got more albuterol when that didn't work (neb) then 2007 dx as chf and some better but continue neb up to 4 x daily and not a lot better but lost nebulizer when moved  spring 2016 and actually fine s them  But  by early Jan 2017  still twice daily albuterol hfa so referred to pulmonary clinic 10/07/2015 by Dr Tempie Hoist with increased sob/noct cough despite optimal rx of chf.    History of Present Illness  10/07/2015 1st [facility] office visit/ [name]   Chief Complaint  Patient presents with  . Pulmonary Consult    Referred by Dr. Haroldine Laws.  Pt c/o SOB for the past month.  She states she gets winded walking from room to room.  She also c/o chest congestion and cough with yellow green sputum.    indolent onset progressive doe x 1.5 month  Plus newly worse hs with  Cough/gagging/  and albuterol  Relieves x 2h then repeats pattern  Dx with osa in 2001 and mostly on cpap until 2 y ago and none now but to be delivered in a week  Has overt hb on ppi but does not tol well  Assoc nasal congestion as well  rec Plan A = Automatic = Dulera 100 Take 2 puffs first thing in am and then another 2 puffs about 12 hours later - if not happy with sample or insurance coverage please call  Plan B = Backup Only use your albuterol (proair) as a rescue medication   Stop the protonix and take the zantac 150 mg after bfast and supper until return GERD  Diet   Please schedule a follow up office visit in 6 weeks, call sooner if needed with full PFTs ok to push back if needed   Late add  rec nuiron 150 mg daily   11/18/2015  Extended  f/u ov/[name] re: cough > sob since Jan 2017  Chief Complaint  Patient presents with  . Follow-up    Cough and SOB have not improved. She is coughing up yellow to green sputum.  She has had to use rescue inhaler x 2 since the last visit.   wakes up cpap about 2 am coughing and has to take it off Coughs seems worse in am  With immediately producing sev tbsp green mucus, worse off dulera 100 and on coreg 25 bid  Coughs so hard she gags but no vomiting or clear HB  Mostly sob with cough now  No obvious day to day or daytime variability or assoc chronic cough or cp or chest tightness, subjective wheeze . No unusual exp hx or h/o childhood pna/ asthma or knowledge of premature birth.  Sleeping ok without nocturnal  or early am exacerbation  of respiratory  c/o's or need for noct saba. Also denies any obvious fluctuation of symptoms with weather or environmental changes or other aggravating or alleviating factors except as outlined above   Current Medications, Allergies, Complete Past Medical History, Past Surgical History, Family History, and Social History were reviewed  in [employer] [employer] record.  ROS  The following are not active complaints unless bolded sore throat, dysphagia, dental problems, itching, sneezing,  nasal congestion or excess/ purulent secretions, ear ache,   fever, chills, sweats, unintended wt loss, classically pleuritic or exertional cp, hemoptysis,  orthopnea pnd or leg swelling, presyncope, palpitations, abdominal pain, anorexia, nausea, vomiting, diarrhea  or change in bowel or bladder habits, change in stools or urine, dysuria,hematuria,  rash, arthralgias, visual complaints, headache, numbness, weakness or ataxia or problems with walking or coordination,  change in mood/affect or memory.           Objective:   Physical Exam  amb obese bf nad  11/18/2015       256   10/07/15 262 lb 3.2 oz (118.933 kg)    09/12/15 257 lb 8 oz (116.801 kg)  08/16/15 250 lb (113.399 kg)    Vital signs reviewed   HEENT: nl dentition, turbinates, and oropharynx. Nl external ear canals without cough reflex   NECK :  without JVD/Nodes/TM/ nl carotid upstrokes bilaterally   LUNGS: no acc muscle use,  Nl contour chest which is clear to A and P bilaterally without cough on insp or exp maneuvers   CV:  RRR  no s3 or murmur or increase in P2,  Trace to 1+ pitting bilateral lower ext  edema   ABD:  soft and nontender with nl inspiratory excursion in the supine position. No bruits or organomegaly, bowel sounds nl  MS:  Nl gait/ ext warm without deformities, calf tenderness, cyanosis or clubbing No obvious joint restrictions   SKIN: warm and dry without lesions    NEURO:  alert, approp, nl sensorium with  no motor deficits      I personally reviewed images and agree with radiology impression as follows:  CXR:  10/07/15  Enlargement of cardiac silhouette post AICD.  No acute abnormalities.          Assessment & Plan:

## 2015-11-19 DIAGNOSIS — J45991 Cough variant asthma: Secondary | ICD-10-CM | POA: Insufficient documentation

## 2015-11-19 MED FILL — SPIRONOLACTONE 25 MG TABLET: 25 | 30 days supply | Qty: 15 | Fill #0

## 2015-11-19 NOTE — Assessment & Plan Note (Signed)
Body mass index is 47.59 trending down   Lab Results  Component Value Date   TSH 2.15 10/07/2015     Contributing to gerd tendency/ doe/reviewed the need and the process to achieve and maintain neg calorie balance > defer f/u primary care including intermittently monitoring thyroid status

## 2015-11-19 NOTE — Assessment & Plan Note (Signed)
  Lab Results  Component Value Date   HGB 10.9* 10/07/2015   HGB 11.4* 05/05/2015   HGB 10.8* 05/04/2015     Now on Fe with h/o menorrhagia being addressed, may explain some of her sob but certainly not the cough

## 2015-11-19 NOTE — Assessment & Plan Note (Signed)
Her chf is much better but her cough > sob are not and she may have an element of cough variant astha > strongly prefer in this setting: Bystolic, the most beta -1  selective Beta blocker available in sample form, with bisoprolol the most selective generic choice  on the market.   Will contact Dr Tempie Hoist re option of using coreg here.

## 2015-11-19 NOTE — Assessment & Plan Note (Addendum)
-   Allergy profile 10/07/15  >  Eos 0.3 /  IgE  39 Pos RAST grass - 11/18/2015  After extensive coaching HFA effectiveness =    75% > rechalleng with dulera 100 2bid  - Spirometry 11/18/2015  FEV1 1.58 (68%)  Ratio 81 pre rx with no curvature  Symptoms are markedly disproportionate to objective findings and not clear this is a lung problem but pt does appear to have difficult airway management issues. DDX of  difficult airways management almost all start with A and  include Adherence, Ace Inhibitors, Acid Reflux, Active Sinus Disease, Alpha 1 Antitripsin deficiency, Anxiety masquerading as Airways dz,  ABPA,  Allergy(esp in young), Aspiration (esp in elderly), Adverse effects of meds,  Active smokers, A bunch of PE's (a small clot burden can't cause this syndrome unless there is already severe underlying pulm or vascular dz with poor reserve) plus two Bs  = Bronchiectasis and Beta blocker use..and one C= CHF  Adherence is always the initial "prime suspect" and is a multilayered concern that requires a "trust but verify" approach in every patient - starting with knowing how to use medications, especially inhalers, correctly, keeping up with refills and understanding the fundamental difference between maintenance and prns vs those medications only taken for a very short course and then stopped and not refilled.   - The proper method of use, as well as anticipated side effects, of a metered-dose inhaler are discussed and demonstrated to the patient. Improved effectiveness after extensive coaching during this visit to a level of approximately 75 % from a baseline of 50 %   ? Acid (or non-acid) GERD > always difficult to exclude as up to 75% of pts in some series report no assoc GI/ Heartburn symptoms> rec continue max (24h)  acid suppression achievable with H2  as intol to PPI  and diet restrictions/ reviewed    ? Active sinus dz > augmentin x 10 days then ? Sinus ct in no better  ? Allergy / not Pos grass RAST  > avoidance, Prednisone 10 mg take  4 each am x 2 days,   2 each am x 2 days,  1 each am x 2 days and stop, consider singulair trial   ? ACEi > reported ACEi like cough reported on Entresto but only 5-8% (same given originally for ACEi which is now closer to 25% in the literature) > for now leave on entresto   ? BB > see chf  ? CHF >   Last bnp 53 on 10/29/15 so strongly doubt   I had an extended discussion with the patient reviewing all relevant studies completed to date and  lasting 25 minutes of a 40 minute visit    Each maintenance medication was reviewed in detail including most importantly the difference between maintenance and prns and under what circumstances the prns are to be triggered using an action plan format that is not reflected in the computer generated alphabetically organized AVS.    Please see instructions for details which were reviewed in writing and the patient given a copy highlighting the part that I personally wrote and discussed at today's ov.

## 2015-11-27 ENCOUNTER — Telehealth: Payer: Self-pay | Admitting: Internal Medicine

## 2015-11-27 MED ORDER — FLUCONAZOLE 100 MG PO TABS: 100.0000 mg | ORAL_TABLET | Freq: Every day | ORAL | Status: DC

## 2015-11-27 NOTE — Telephone Encounter (Signed)
Called and spoke to pt. Pt was seen by MW on 4.24.17 and was given Augmentin. Pt c/o oral thrush (started this morning) with white patches on tongue and c/o vaginal yeast infection that present 3 days ago. Pt states she has one more day of Augmentin and will then be finished. Pt states she has taken Diflucan in the past and has worked well. Pt states while taking the Augmentin she has been eating a culture yogurt as directed.   Dr. Melvyn Novas please advise. Thanks.     Patient Instructions       Continue duelra 100  Take 2 puffs first thing in am and then another 2 puffs about 12 hours later.    For cough > mucinex 1200 mg every 12 hours with flutter valve as possible and if this fails >  Demerol 50 mg every 4 hours (will make you sleepy)  Augmentin 875 mg take one pill twice daily  X 10 days - take at breakfast and supper with large glass of water.  It would help reduce the usual side effects (diarrhea and yeast infections) if you ate cultured yogurt at lunch.   Prednisone 10 mg take  4 each am x 2 days,   2 each am x 2 days,  1 each am x 2 days and stop   I will send Dr Tempie Hoist a copy of this note ? Ok to change coreg to bisoprolol so your asthma medications work better   Please schedule a follow up office visit in 4 weeks, sooner if needed

## 2015-11-27 NOTE — Telephone Encounter (Signed)
Diflucan 100mg  one daily x 3 days only

## 2015-11-27 NOTE — Telephone Encounter (Signed)
Called and spoke to pt. Informed her of the recs per MW. Rx sent to preferred pharmacy. Pt verbalized understanding and denied any further questions or concerns at this time.   

## 2015-12-04 MED FILL — FLUCONAZOLE 150 MG TABLET: 150 | 9 days supply | Qty: 3 | Fill #0

## 2015-12-04 MED FILL — TORSEMIDE 20 MG TABLET: 20 | 30 days supply | Qty: 120 | Fill #1

## 2015-12-11 ENCOUNTER — Ambulatory Visit (HOSPITAL_COMMUNITY)
Admission: RE | Admit: 2015-12-11 | Discharge: 2015-12-11 | Disposition: A | Discharge status: Home / Self Care | Payer: BLUE CROSS/BLUE SHIELD | Source: Ambulatory Visit | Attending: Internal Medicine | Admitting: Internal Medicine

## 2015-12-11 ENCOUNTER — Encounter (HOSPITAL_COMMUNITY): Payer: Self-pay | Admitting: Internal Medicine

## 2015-12-11 VITALS — BP 127/74 | HR 73 | Resp 18 | Wt 261.5 lb

## 2015-12-11 DIAGNOSIS — I10 Essential (primary) hypertension: Secondary | ICD-10-CM | POA: Diagnosis not present

## 2015-12-11 DIAGNOSIS — Z79899 Other long term (current) drug therapy: Secondary | ICD-10-CM | POA: Insufficient documentation

## 2015-12-11 DIAGNOSIS — E669 Obesity, unspecified: Secondary | ICD-10-CM | POA: Insufficient documentation

## 2015-12-11 DIAGNOSIS — I110 Hypertensive heart disease with heart failure: Secondary | ICD-10-CM | POA: Insufficient documentation

## 2015-12-11 DIAGNOSIS — J45909 Unspecified asthma, uncomplicated: Secondary | ICD-10-CM | POA: Insufficient documentation

## 2015-12-11 DIAGNOSIS — I428 Other cardiomyopathies: Secondary | ICD-10-CM | POA: Insufficient documentation

## 2015-12-11 DIAGNOSIS — Z95810 Presence of automatic (implantable) cardiac defibrillator: Secondary | ICD-10-CM | POA: Insufficient documentation

## 2015-12-11 DIAGNOSIS — Z01810 Encounter for preprocedural cardiovascular examination: Secondary | ICD-10-CM | POA: Insufficient documentation

## 2015-12-11 DIAGNOSIS — Z8249 Family history of ischemic heart disease and other diseases of the circulatory system: Secondary | ICD-10-CM | POA: Insufficient documentation

## 2015-12-11 DIAGNOSIS — I5022 Chronic systolic (congestive) heart failure: Secondary | ICD-10-CM | POA: Diagnosis not present

## 2015-12-11 DIAGNOSIS — Z833 Family history of diabetes mellitus: Secondary | ICD-10-CM | POA: Insufficient documentation

## 2015-12-11 DIAGNOSIS — G4733 Obstructive sleep apnea (adult) (pediatric): Secondary | ICD-10-CM | POA: Insufficient documentation

## 2015-12-11 NOTE — Progress Notes (Signed)
Patient ID: [name], female   DOB: 1976/05/29, 40 y.o.   MRN: [id number] Patient ID: [name], female   DOB: October 13, 1975, 40 y.o.   MRN: [id number]  ADVANCED HF CLINIC NOTE  Patient ID: [name], female   DOB: 1975/12/25, 40 y.o.   MRN: [id number]  PCP: Dr. Heath Gold  OB:  Dr. Leo Grosser  EP: Dr. Lovena Le CHF: [name]  HPI: [name] is a 40 year old woman with h/o obesity, chronic systolic heart failure due to nonischemic cardiomyopathy (cath 2007. No CAD) s/p Medtronic ICD implantation, asthma/chronic bronchitis and severe hypertension.   ECHOs 04/27/2011 45-50%.   08/20/11 ECHO 55-60%   05/19/12 EF ~40%   07/01/12 EF ~40%   08/02/12 EF~40%    2/14 EF ~50%                          5/14 EF ~35%                          1/16 EF 25-30%                           3/17 EF 55-60% Grade I DD                           CPX 01/2014: Resting HR 75, Peak HR 121 Peak VO2 15.2, 74.4% predicted VE/VCO2 slope: 26.4 OUES: 1.93 Peak RER 1.15 VE/MVV 61.7%  Follow up for Heart Failure: Feels much better. Saw Dr. Melvyn Novas recently woth persistent cough. Thought it might be related to HF meds and cough-variant asthma but cough has resolved with 2nd round of abx. Breathing ok. Mild edema. Still working. EF by echo in March 55-60%. Watching her diet and exercising in the gym but still not losing weight. Wants to do the gastric sleeve.    LABS: 01/03/14: K 4.2 Cr 1.27 pBNP 1766 (down from 4070)            02/13/14: K 4.4, creatinine 1.27            05/15/14: K 4.0, Creatinine 1.36  08/29/14: K 3.5, Creatinine 1.17  ICD: Optivol - Fluid well below threshold consistent with stable volume status.  3+ hours of activity daily. No VT/VF.   SH: Married. Lives in [place], 2 children. Working at [employer]. No ETOH or smoking  FH: Mother living: DM2, HTN, no CAD        Father living: DM2, HTN  ROS: All systems negative except as listed in HPI, PMH and Problem List.  Past Medical History  Diagnosis Date  .  Hypertension     currently on Labetalol 200mg  BID  . Infection     UTI;not frequent;last infection was in June  . Infection     Yeast;not frequent  . Asthma     Inhaler prn;triggered by seasonal changes mostly cold weathert;last asthma  attack years ago  . CHF (congestive heart failure) ([place]) 01/2006    Has Pacemaker and Defibrillator;was on heart meds;no longer taking since pregancy  . Anemia     Iron supplements in the past  . Migraines     Can't take Procardia XL, causes migraines  . Varicose veins 1998    Developed during last pregnancy  . Other primary cardiomyopathies   . Atrial fibrillation ([place])   . Paroxysmal ventricular tachycardia ([place])   .  CHF (congestive heart failure) ([place])   . Hypertension   . Acute bronchitis and bronchiolitis     Current Outpatient Prescriptions  Medication Sig Dispense Refill  . albuterol (PROVENTIL HFA;VENTOLIN HFA) 108 (90 Base) MCG/ACT inhaler Inhale 2 puffs into the lungs every 6 (six) hours as needed for wheezing or shortness of breath. Rescue inhaler 1 Inhaler 1  . amLODipine (NORVASC) 2.5 MG tablet Take 1 tablet (2.5 mg total) by mouth daily. 30 tablet 6  . carvedilol (COREG) 25 MG tablet TAKE 1 TABLET BY MOUTH 2 TIMES DAILY WITH A MEAL. 60 tablet 6  . cetirizine (ZYRTEC) 10 MG tablet Take 10 mg by mouth daily as needed for allergies or rhinitis.     . ferrous sulfate 325 (65 FE) MG tablet Take 1 tablet (325 mg total) by mouth 3 (three) times daily with meals. 90 tablet 3  . fluconazole (DIFLUCAN) 100 MG tablet Take 1 tablet (100 mg total) by mouth daily. 3 tablet 0  . hydrALAZINE (APRESOLINE) 50 MG tablet Take 1.5 tablets (75 mg total) by mouth 3 (three) times daily. 135 tablet 6  . isosorbide mononitrate (IMDUR) 60 MG 24 hr tablet Take 1 tablet (60 mg total) by mouth daily. 30 tablet 6  . meperidine (DEMEROL) 50 MG tablet Take 1 tablet (50 mg total) by mouth every 4 (four) hours as needed for severe pain. 30 tablet 0  .  mometasone-formoterol (DULERA) 100-5 MCG/ACT AERO Take 2 puffs first thing in am and then another 2 puffs about 12 hours later. 1 Inhaler 11  . potassium chloride SA (K-DUR,KLOR-CON) 20 MEQ tablet Take 1 tablet (20 mEq total) by mouth 2 (two) times daily. 60 tablet 3  . ranitidine (ZANTAC) 150 MG tablet Take 150 mg by mouth 2 (two) times daily.    Marland Kitchen Respiratory Therapy Supplies (FLUTTER) DEVI Use as directed 1 each 0  . sacubitril-valsartan (ENTRESTO) 97-103 MG Take 1 tablet by mouth 2 (two) times daily. 60 tablet 6  . spironolactone (ALDACTONE) 25 MG tablet TAKE 1/2 TABLET BY MOUTH DAILY 15 tablet 3  . torsemide (DEMADEX) 20 MG tablet Take 20-60 mg by mouth 2 (two) times daily. Reported on 10/29/2015    . torsemide (DEMADEX) 20 MG tablet TAKE 3 TABS BY MOUTH EVERY MORNING AND 1 TAB EVERY EVENING 120 tablet 6   No current facility-administered medications for this encounter.    Filed Vitals:   12/11/15 1450  BP: 127/74  Pulse: 73  Resp: 18  Weight: 261 lb 8 oz (118.616 kg)  SpO2: 97%   Wt Readings from Last 3 Encounters:  12/11/15 261 lb 8 oz (118.616 kg)  11/18/15 256 lb (116.121 kg)  10/29/15 261 lb 4 oz (118.502 kg)     PHYSICAL EXAM: General: Well appearing; No resp difficulty.  HEENT: normal Neck: supple. JVP hard to see but looks ok  ; Carotids 2+ bilaterally; no bruits. No lymphadenopathy or thryomegaly appreciated. Cor: PMI normal. Regular rate & rhythm. +s4. No murmurs appreciated Lungs: clear Abdomen: obese soft, nontender, mildly distended and obese. No hepatosplenomegaly. No bruits or masses. Good bowel sounds. Extremities: no cyanosis, clubbing, rash, tr-1+ edema  Neuro: alert & orientedx3, cranial nerves grossly intact. Moves all 4 extremities w/o difficulty. Affect pleasant.  ASSESSMENT & PLAN:  1) Chronic systolic heart failure, NICM s/p ICD likely r/t HTN, last echo 1/16 EF 25-30%. Cath 8/16 EF 35% No CAD. Echo 3/17 EF 55-60% - EF now normal. NYHA I-II - Volume  status stable to  mildly elevated on exam. Can take extra torsemide as needed for swelling.   - Continue hydralazine 75 mg TID, Imdur 60 mg daily - Continue spiro 12.5 mg daily.  - Continue Entresto 97/103 bid - Continue carvedilol 25 bid 2) OSA  -  Has got CPA but feels it is too much pressure. Wil lcall [facility].  3) HTN - Stable on current regimen. 4) Asthma/chronic bronchitis - Stable. 5) Obesity: - In process of education for gastric sleeve. She is cleared from a cardiac perspective to proceed.    Briana Livingstone,MD 3:19 PM

## 2015-12-11 NOTE — Addendum Note (Signed)
Encounter addended by: Harvie Junior, CMA on: 12/11/2015  3:31 PM<BR>     Documentation filed: Patient Instructions Section

## 2015-12-11 NOTE — Patient Instructions (Signed)
Follow up with Dr. Bensimhon in 4 months 

## 2015-12-16 MED FILL — SPIRONOLACTONE 25 MG TABLET: 25 | 30 days supply | Qty: 15 | Fill #1

## 2015-12-16 MED FILL — AMLODIPINE BESYLATE 2.5 MG: 2.5 | 30 days supply | Qty: 30 | Fill #2

## 2015-12-16 MED FILL — ISOSORBIDE MN ER 60 MG TAB: 60 | 30 days supply | Qty: 30 | Fill #2

## 2015-12-16 MED FILL — CARVEDILOL 25 MG TABLET: 25 | 30 days supply | Qty: 60 | Fill #0

## 2015-12-17 ENCOUNTER — Encounter: Payer: Self-pay | Admitting: Cardiology

## 2015-12-17 ENCOUNTER — Ambulatory Visit: Payer: Self-pay | Admitting: Internal Medicine

## 2015-12-17 LAB — CUP PACEART REMOTE DEVICE CHECK
Battery Remaining Longevity: 132 mo
Battery Voltage: 3.04 V
Brady Statistic RV Percent Paced: 0.01 %
HIGH POWER IMPEDANCE MEASURED VALUE: 41 Ohm
HighPow Impedance: 49 Ohm
Implantable Lead Location: 753860
Implantable Lead Model: 5947
Lead Channel Impedance Value: 285 Ohm
Lead Channel Impedance Value: 342 Ohm
Lead Channel Pacing Threshold Amplitude: 2.125 V
Lead Channel Pacing Threshold Pulse Width: 0.4 ms
Lead Channel Sensing Intrinsic Amplitude: 14.375 mV
Lead Channel Setting Sensing Sensitivity: 0.3 mV
MDC IDC LEAD IMPLANT DT: 20070718
MDC IDC MSMT LEADCHNL RV SENSING INTR AMPL: 14.375 mV
MDC IDC SESS DTM: 20170412105318
MDC IDC SET LEADCHNL RV PACING AMPLITUDE: 3.5 V
MDC IDC SET LEADCHNL RV PACING PULSEWIDTH: 0.7 ms

## 2015-12-31 ENCOUNTER — Other Ambulatory Visit: Payer: Self-pay | Admitting: Family Medicine

## 2015-12-31 DIAGNOSIS — N83209 Unspecified ovarian cyst, unspecified side: Secondary | ICD-10-CM

## 2016-01-02 MED FILL — TORSEMIDE 20 MG TABLET: 20 | 30 days supply | Qty: 120 | Fill #2

## 2016-01-16 ENCOUNTER — Ambulatory Visit
Admission: RE | Admit: 2016-01-16 | Discharge: 2016-01-16 | Disposition: A | Discharge status: Home / Self Care | Payer: BLUE CROSS/BLUE SHIELD | Source: Ambulatory Visit | Attending: Family Medicine | Admitting: Family Medicine

## 2016-01-16 DIAGNOSIS — N83209 Unspecified ovarian cyst, unspecified side: Secondary | ICD-10-CM

## 2016-01-20 ENCOUNTER — Other Ambulatory Visit (HOSPITAL_COMMUNITY): Payer: Self-pay | Admitting: General Surgery

## 2016-01-24 MED FILL — ISOSORBIDE MN ER 60 MG TAB: 60 | 30 days supply | Qty: 30 | Fill #3

## 2016-01-24 MED FILL — SPIRONOLACTONE 25 MG TABLET: 25 | 30 days supply | Qty: 15 | Fill #2

## 2016-01-24 MED FILL — CARVEDILOL 25 MG TABLET: 25 | 30 days supply | Qty: 60 | Fill #1

## 2016-01-24 MED FILL — AMLODIPINE BESYLATE 2.5 MG: 2.5 | 30 days supply | Qty: 30 | Fill #3

## 2016-01-31 ENCOUNTER — Other Ambulatory Visit (HOSPITAL_COMMUNITY): Payer: Self-pay | Admitting: *Deleted

## 2016-01-31 MED ORDER — SACUBITRIL-VALSARTAN 97-103 MG PO TABS: 1.0000 | ORAL_TABLET | Freq: Two times a day (BID) | ORAL | Status: DC

## 2016-02-05 ENCOUNTER — Other Ambulatory Visit (HOSPITAL_COMMUNITY): Payer: Self-pay | Admitting: *Deleted

## 2016-02-06 MED FILL — KLOR-CON M20 TABLET: 20 | 30 days supply | Qty: 60 | Fill #0

## 2016-02-06 MED FILL — TORSEMIDE 20 MG TABLET: 20 | 30 days supply | Qty: 120 | Fill #3

## 2016-02-24 ENCOUNTER — Encounter: Payer: BLUE CROSS/BLUE SHIELD | Attending: General Surgery | Admitting: Dietician

## 2016-02-24 ENCOUNTER — Ambulatory Visit (HOSPITAL_COMMUNITY)
Admission: RE | Admit: 2016-02-24 | Discharge: 2016-02-24 | Disposition: A | Discharge status: Home / Self Care | Payer: BLUE CROSS/BLUE SHIELD | Source: Ambulatory Visit | Attending: General Surgery | Admitting: General Surgery

## 2016-02-24 DIAGNOSIS — K449 Diaphragmatic hernia without obstruction or gangrene: Secondary | ICD-10-CM | POA: Insufficient documentation

## 2016-02-24 MED FILL — AMLODIPINE BESYLATE 2.5 MG: 2.5 | 30 days supply | Qty: 30 | Fill #4

## 2016-02-24 MED FILL — ISOSORBIDE MN ER 60 MG TAB: 60 | 30 days supply | Qty: 30 | Fill #4

## 2016-02-24 MED FILL — CARVEDILOL 25 MG TABLET: 25 | 30 days supply | Qty: 60 | Fill #2

## 2016-02-24 MED FILL — SPIRONOLACTONE 25 MG TABLET: 25 | 30 days supply | Qty: 15 | Fill #3

## 2016-02-24 NOTE — Patient Instructions (Signed)
Follow Pre-Op Goals Try Protein Shakes Call [facility] at [phone] when surgery is scheduled to enroll in Pre-Op Class  Things to remember:  Please always be honest with us. We want to support you!  If you have any questions or concerns in between appointments, please call or email Liz, Leslie, or Laurie.  The diet after surgery will be high protein and low in carbohydrate.  Vitamins and calcium need to be taken for the rest of your life.  Feel free to include support people in any classes or appointments.   Supplement recommendations:  Complete" Multivitamin: Sleeve Gastrectomy and RYGB patients take a double dose of MVI. LAGB patients take single dose as it is written on the package. Vitamin must be liquid or chewable but not gummy. Examples of these include Flintstones Complete and Centrum Complete. If the vitamin is bariatric-specific, take 1 dose as it is already formulated for bariatric surgery patients. Examples of these are Bariatric Advantage, Celebrate, and Wellesse. These can be found at the [facility] and/or online.     Calcium citrate: 1500 mg/day of Calcium citrate (also chewable or liquid) is recommended for all procedures. The body is only able to absorb 500-600 mg of Calcium at one time so 3 daily doses of 500 mg are recommended. Calcium doses must be taken a minimum of 2 hours apart. Additionally, Calcium must be taken 2 hours apart from iron-containing MVI. Examples of brands include Celebrate, Bariatric Advantage, and Wellesse. These brands must be purchased online or at the [facility]. Citracal Petites is the only Calcium citrate supplement found in general grocery stores and pharmacies. This is in tablet form and may be recommended for patients who do not tolerate chewable Calcium.  Continued or added Vitamin D supplementation based on individual needs.    Vitamin B12: 300-500 mcg/day for Sleeve Gastrectomy and RYGB. Optional for  LAGB patients as stomach remains fully intact. Must be taken intramuscularly, sublingually, or inhaled nasally. Oral route is not recommended. 

## 2016-02-24 NOTE — Progress Notes (Signed)
  Pre-Op Assessment Visit:  Pre-Operative Sleeve Gastrectomy Surgery  Medical Nutrition Therapy:  Appt start time: 0820   End time:  0855.  Patient was seen on 02/24/2016 for Pre-Operative Nutrition Assessment. Assessment and letter of approval faxed to [facility] Bariatric Surgery Program coordinator on 02/24/2016.   Preferred Learning Style:   No preference indicated   Learning Readiness:   Ready  Handouts given during visit include:  Pre-Op Goals Bariatric Surgery Protein Shakes   During the appointment today the following Pre-Op Goals were reviewed with the patient: Maintain or lose weight as instructed by your surgeon Make healthy food choices Begin to limit portion sizes Limited concentrated sugars and fried foods Keep fat/sugar in the single digits per serving on   food labels Practice CHEWING your food  (aim for 30 chews per bite or until applesauce consistency) Practice not drinking 15 minutes before, during, and 30 minutes after each meal/snack Avoid all carbonated beverages  Avoid/limit caffeinated beverages  Avoid all sugar-sweetened beverages Consume 3 meals per day; eat every 3-5 hours Make a list of non-food related activities Aim for 64-100 ounces of FLUID daily  Aim for at least 60-80 grams of PROTEIN daily Look for a liquid protein source that contain ?15 g protein and ?5 g carbohydrate  (ex: shakes, drinks, shots)  Patient-Centered Goals: Improve health, especially blood pressure and Hgb A1c (prediabetes), and remain healthy for her young daughter.   Demonstrated degree of understanding via:  Teach Back  Teaching Method Utilized:  Visual Auditory Hands on  Barriers to learning/adherence to lifestyle change: none  Patient to call [facility] to enroll in Pre-Op and Post-Op Nutrition Education when surgery date is scheduled.

## 2016-03-10 MED FILL — VIT D2 1.25 MG (50,000 UNIT: 1.25 MG | 56 days supply | Qty: 8 | Fill #0

## 2016-03-17 ENCOUNTER — Other Ambulatory Visit: Payer: Self-pay | Admitting: Family Medicine

## 2016-03-17 DIAGNOSIS — N83209 Unspecified ovarian cyst, unspecified side: Secondary | ICD-10-CM

## 2016-03-17 DIAGNOSIS — D259 Leiomyoma of uterus, unspecified: Secondary | ICD-10-CM

## 2016-03-18 MED FILL — KLOR-CON M20 TABLET: 20 | 30 days supply | Qty: 60 | Fill #1

## 2016-03-18 MED FILL — TORSEMIDE 20 MG TABLET: 20 | 30 days supply | Qty: 120 | Fill #4

## 2016-03-23 ENCOUNTER — Ambulatory Visit
Admission: RE | Admit: 2016-03-23 | Discharge: 2016-03-23 | Disposition: A | Discharge status: Home / Self Care | Payer: BLUE CROSS/BLUE SHIELD | Source: Ambulatory Visit | Attending: Family Medicine | Admitting: Family Medicine

## 2016-03-23 DIAGNOSIS — D259 Leiomyoma of uterus, unspecified: Secondary | ICD-10-CM

## 2016-03-23 DIAGNOSIS — N83209 Unspecified ovarian cyst, unspecified side: Secondary | ICD-10-CM

## 2016-04-03 ENCOUNTER — Other Ambulatory Visit (HOSPITAL_COMMUNITY): Payer: Self-pay | Admitting: Cardiology

## 2016-04-03 MED FILL — ISOSORBIDE MN ER 60 MG TAB: 60 | 30 days supply | Qty: 30 | Fill #5

## 2016-04-03 MED FILL — SPIRONOLACTONE 25 MG TABLET: 25 | 30 days supply | Qty: 15 | Fill #0

## 2016-04-03 MED FILL — CARVEDILOL 25 MG TABLET: 25 | 30 days supply | Qty: 60 | Fill #3

## 2016-04-03 MED FILL — hydrALAZINE HCL 50 MG TABS: 50 | 30 days supply | Qty: 135 | Fill #1

## 2016-04-03 MED FILL — AMLODIPINE BESYLATE 2.5 MG: 2.5 | 30 days supply | Qty: 30 | Fill #5

## 2016-04-17 ENCOUNTER — Encounter: Payer: Self-pay | Admitting: Genetic Counselor

## 2016-04-17 ENCOUNTER — Telehealth: Payer: Self-pay | Admitting: Genetic Counselor

## 2016-04-17 NOTE — Telephone Encounter (Signed)
Pt confirmed appt., completed intake, made aware of MyChart info, mailed pt letter, fax referring provider appt date/time

## 2016-04-24 MED FILL — TORSEMIDE 20 MG TABLET: 20 | 30 days supply | Qty: 120 | Fill #5

## 2016-04-24 MED FILL — KLOR-CON M20 TABLET: 20 | 30 days supply | Qty: 60 | Fill #2

## 2016-05-04 ENCOUNTER — Encounter (HOSPITAL_COMMUNITY): Payer: Self-pay | Admitting: Emergency Medicine

## 2016-05-04 ENCOUNTER — Emergency Department (HOSPITAL_COMMUNITY)
Admission: EM | Admit: 2016-05-04 | Discharge: 2016-05-04 | Disposition: A | Discharge status: Home / Self Care | Payer: BLUE CROSS/BLUE SHIELD | Attending: Emergency Medicine | Admitting: Emergency Medicine

## 2016-05-04 ENCOUNTER — Encounter (HOSPITAL_COMMUNITY): Payer: Self-pay | Admitting: Internal Medicine

## 2016-05-04 ENCOUNTER — Emergency Department (HOSPITAL_COMMUNITY): Payer: Self-pay

## 2016-05-04 DIAGNOSIS — R0789 Other chest pain: Secondary | ICD-10-CM | POA: Insufficient documentation

## 2016-05-04 DIAGNOSIS — J45909 Unspecified asthma, uncomplicated: Secondary | ICD-10-CM | POA: Insufficient documentation

## 2016-05-04 DIAGNOSIS — Z79899 Other long term (current) drug therapy: Secondary | ICD-10-CM | POA: Insufficient documentation

## 2016-05-04 DIAGNOSIS — R079 Chest pain, unspecified: Secondary | ICD-10-CM | POA: Diagnosis not present

## 2016-05-04 DIAGNOSIS — I5043 Acute on chronic combined systolic (congestive) and diastolic (congestive) heart failure: Secondary | ICD-10-CM | POA: Insufficient documentation

## 2016-05-04 DIAGNOSIS — I5022 Chronic systolic (congestive) heart failure: Secondary | ICD-10-CM | POA: Diagnosis not present

## 2016-05-04 DIAGNOSIS — I110 Hypertensive heart disease with heart failure: Secondary | ICD-10-CM | POA: Insufficient documentation

## 2016-05-04 DIAGNOSIS — Z950 Presence of cardiac pacemaker: Secondary | ICD-10-CM | POA: Insufficient documentation

## 2016-05-04 LAB — BASIC METABOLIC PANEL
ANION GAP: 6 (ref 5–15)
BUN: 14 mg/dL (ref 6–20)
CALCIUM: 9 mg/dL (ref 8.9–10.3)
CO2: 27 mmol/L (ref 22–32)
Chloride: 107 mmol/L (ref 101–111)
Creatinine, Ser: 1.12 mg/dL — ABNORMAL HIGH (ref 0.44–1.00)
Glucose, Bld: 152 mg/dL — ABNORMAL HIGH (ref 65–99)
Potassium: 4.1 mmol/L (ref 3.5–5.1)
Sodium: 140 mmol/L (ref 135–145)

## 2016-05-04 LAB — CBC
HCT: 37.4 % (ref 36.0–46.0)
HEMOGLOBIN: 11.3 g/dL — AB (ref 12.0–15.0)
MCH: 25.6 pg — ABNORMAL LOW (ref 26.0–34.0)
MCHC: 30.2 g/dL (ref 30.0–36.0)
MCV: 84.8 fL (ref 78.0–100.0)
Platelets: 233 10*3/uL (ref 150–400)
RBC: 4.41 MIL/uL (ref 3.87–5.11)
RDW: 17.1 % — ABNORMAL HIGH (ref 11.5–15.5)
WBC: 9 10*3/uL (ref 4.0–10.5)

## 2016-05-04 LAB — I-STAT TROPONIN, ED
TROPONIN I, POC: 0 ng/mL (ref 0.00–0.08)
Troponin i, poc: 0 ng/mL (ref 0.00–0.08)

## 2016-05-04 LAB — D-DIMER, QUANTITATIVE (NOT AT ARMC): D DIMER QUANT: 0.63 ug{FEU}/mL — AB (ref 0.00–0.50)

## 2016-05-04 LAB — BRAIN NATRIURETIC PEPTIDE: B NATRIURETIC PEPTIDE 5: 59.7 pg/mL (ref 0.0–100.0)

## 2016-05-04 MED ORDER — ASPIRIN 81 MG PO CHEW
324.0000 mg | CHEWABLE_TABLET | Freq: Once | ORAL | Status: AC
  Administered 2016-05-04: 324 mg via ORAL
  Filled 2016-05-04: qty 4

## 2016-05-04 MED ORDER — FUROSEMIDE 10 MG/ML IJ SOLN
80.0000 mg | Freq: Once | INTRAMUSCULAR | Status: AC
  Administered 2016-05-04: 80 mg via INTRAVENOUS
  Filled 2016-05-04: qty 8

## 2016-05-04 MED ORDER — POTASSIUM CHLORIDE CRYS ER 20 MEQ PO TBCR
20.0000 meq | EXTENDED_RELEASE_TABLET | Freq: Once | ORAL | Status: AC
Start: 2016-05-04 — End: 2016-05-04
  Administered 2016-05-04: 20 meq via ORAL
  Filled 2016-05-04: qty 1

## 2016-05-04 NOTE — Consult Note (Signed)
Advanced Heart Failure Team Consult Note  Referring Physician: Thayer Jew, MD PCP: Dr. Heath Gold  OB:  Dr. Leo Grosser  EP: Dr. Lovena Le CHF: [place]  Reason for Consultation: Chest pain  HPI:    [name] is a 40 y.o. female with h/o obesity, chronic systolic heart failure due to nonischemic cardiomyopathy (cath 2007 and 10/16. No CAD) s/p Medtronic ICD implantation, asthma/chronic bronchitis and severe hypertension.   Echo 09/2015 EF 55-60% Grade I DD  Has h/o chronic CP. Cath in 2007 and 10/16 with normal coronaries.   Last seen in HF clinic 12/11/15. Had cough thought to be cough-variant asthma per Dr. Melvyn Novas vs related to HF meds. Resolved with ABX round x 2. Volume status was mildly elevated. Encouraged to take extra torsemide as needed. Weight 261 lbs at that time.   She presented this morning with "chest pain". States she woke up this morning due to severe L jaw pain. States it radiates down into shoulder and chest. Pertinent labs include K 4.1, Creatinine 1.12, BNP 59.7, D-Dimer 0.63 (just above normal range), Hgb 11.3. CXR with no acute changes. EKG without ischemic changes. Trop negative x 2.  Pain not relieved or worsened by any activity. Had recurent episode in ED with spontaneous resolution. States she had a similar pain last year that also spontaneously resolved. Pain starts in Jaw ( she is very clear on this) and radiates down into shoulder and chest. No N/V. No lightheadedness or dizziness. Has chronic 2 pillow orthopnea. Not any worse than usual.  Feet are swollen > baseline.   Review of Systems: [y] = yes, [ ]  = no   General: Weight gain [ ] ; Weight loss [ ] ; Anorexia [ ] ; Fatigue [ ] ; Fever [ ] ; Chills [ ] ; Weakness [ ]   Cardiac: Chest pain/pressure [y]; Resting SOB [ ] ; Exertional SOB [y]; Orthopnea [y]; Pedal Edema [y]; Palpitations [ ] ; Syncope [ ] ; Presyncope [ ] ; Paroxysmal nocturnal dyspnea[ ]   Pulmonary: Cough [y]; Wheezing[ ] ; Hemoptysis[ ] ; Sputum [ ] ;  Snoring [ ]   GI: Vomiting[ ] ; Dysphagia[ ] ; Melena[ ] ; Hematochezia [ ] ; Heartburn[ ] ; Abdominal pain [ ] ; Constipation [ ] ; Diarrhea [ ] ; BRBPR [ ]   GU: Hematuria[ ] ; Dysuria [ ] ; Nocturia[ ]   Vascular: Pain in legs with walking [ ] ; Pain in feet with lying flat [ ] ; Non-healing sores [ ] ; Stroke [ ] ; TIA [ ] ; Slurred speech [ ] ;  Neuro: Headaches[ ] ; Vertigo[ ] ; Seizures[ ] ; Paresthesias[ ] ;Blurred vision [ ] ; Diplopia [ ] ; Vision changes [ ]   Ortho/Skin: Arthritis [ ] ; Joint pain [y]; Muscle pain [ ] ; Joint swelling [ ] ; Back Pain [y]; Rash [ ]   Psych: Depression[ ] ; Anxiety[ ]   Heme: Bleeding problems [ ] ; Clotting disorders [ ] ; Anemia [ ]   Endocrine: Diabetes [ ] ; Thyroid dysfunction[ ]   Home Medications Prior to Admission medications   Medication Sig Start Date End Date Taking? Authorizing Provider  albuterol (PROVENTIL HFA;VENTOLIN HFA) 108 (90 Base) MCG/ACT inhaler Inhale 2 puffs into the lungs every 6 (six) hours as needed for wheezing or shortness of breath. Rescue inhaler 09/12/15  Yes Jolaine Artist, MD  amLODipine (NORVASC) 2.5 MG tablet Take 1 tablet (2.5 mg total) by mouth daily. 10/17/15  Yes Shaune Pascal Bertine Schlottman, MD  CALCIUM PO Take 1 tablet by mouth 3 (three) times daily.   Yes Historical Provider, MD  carvedilol (COREG) 25 MG tablet TAKE 1 TABLET BY MOUTH 2 TIMES DAILY WITH A  MEAL. 09/17/15  Yes Jolaine Artist, MD  cetirizine (ZYRTEC) 10 MG tablet Take 10 mg by mouth daily as needed for allergies or rhinitis.    Yes Historical Provider, MD  ferrous sulfate 325 (65 FE) MG tablet Take 1 tablet (325 mg total) by mouth 3 (three) times daily with meals. 10/08/15  Yes Tanda Rockers, MD  hydrALAZINE (APRESOLINE) 50 MG tablet Take 1.5 tablets (75 mg total) by mouth 3 (three) times daily. 09/12/15  Yes Jolaine Artist, MD  isosorbide mononitrate (IMDUR) 60 MG 24 hr tablet Take 1 tablet (60 mg total) by mouth daily. 10/17/15  Yes Jolaine Artist, MD  meperidine (DEMEROL) 50 MG  tablet Take 1 tablet (50 mg total) by mouth every 4 (four) hours as needed for severe pain. 11/18/15  Yes Tanda Rockers, MD  potassium chloride SA (K-DUR,KLOR-CON) 20 MEQ tablet Take 1 tablet (20 mEq total) by mouth 2 (two) times daily. 10/29/15  Yes [name], PA-C  ranitidine (ZANTAC) 150 MG tablet Take 150 mg by mouth 2 (two) times daily.   Yes Historical Provider, MD  sacubitril-valsartan (ENTRESTO) 97-103 MG Take 1 tablet by mouth 2 (two) times daily. 01/31/16  Yes Jolaine Artist, MD  spironolactone (ALDACTONE) 25 MG tablet Take 0.5 tablets (12.5 mg total) by mouth daily. Please call to schedule appt for further refills [phone] - 1st attempt 04/03/16  Yes Larey Dresser, MD  torsemide (DEMADEX) 20 MG tablet TAKE 3 TABS BY MOUTH EVERY MORNING AND 1 TAB EVERY EVENING 10/29/15  Yes Satira Mccallum Tillery, PA-C  fluconazole (DIFLUCAN) 100 MG tablet Take 1 tablet (100 mg total) by mouth daily. Patient not taking: Reported on 05/04/2016 11/27/15   Tanda Rockers, MD  mometasone-formoterol [facility]) 100-5 MCG/ACT AERO Take 2 puffs first thing in am and then another 2 puffs about 12 hours later. Patient not taking: Reported on 05/04/2016 11/18/15   Tanda Rockers, MD  Respiratory Therapy Supplies (FLUTTER) DEVI Use as directed 11/18/15   Tanda Rockers, MD    Past Medical History: Past Medical History:  Diagnosis Date  . Acute bronchitis and bronchiolitis   . Anemia    Iron supplements in the past  . Asthma    Inhaler prn;triggered by seasonal changes mostly cold weathert;last asthma  attack years ago  . Atrial fibrillation ([place])   . CHF (congestive heart failure) ([place]) 01/2006   Has Pacemaker and Defibrillator;was on heart meds;no longer taking since pregancy  . CHF (congestive heart failure) ([place])   . Hypertension    currently on Labetalol 200mg  BID  . Hypertension   . Infection    UTI;not frequent;last infection was in June  . Infection    Yeast;not frequent  . Migraines     Can't take Procardia XL, causes migraines  . Other primary cardiomyopathies   . Paroxysmal ventricular tachycardia ([place])   . Varicose veins 1998   Developed during last pregnancy    Past Surgical History: Past Surgical History:  Procedure Laterality Date  . BREAST REDUCTION SURGERY  03/05/1994   d/t back, shoulder, and neck pain (42 F)  . CARDIAC CATHETERIZATION     EF 30-35%  . CARDIAC CATHETERIZATION N/A 05/04/2015   Procedure: Left Heart Cath and Coronary Angiography;  Surgeon: Belva Crome, MD;  Location: [facility];  Service: Cardiovascular;  Laterality: N/A;  . CARDIAC DEFIBRILLATOR PLACEMENT    . CESAREAN SECTION  1998   d/t preeclampsia  . IMPLANTABLE CARDIOVERTER  DEFIBRILLATOR (ICD) GENERATOR CHANGE N/A 09/10/2014   Procedure: ICD GENERATOR CHANGE;  Surgeon: Evans Lance, MD;  Location: [facility] CATH LAB;  Service: Cardiovascular;  Laterality: N/A;  . INSERT / REPLACE / REMOVE PACEMAKER   02/10/2006   Status post implantable cardioverter-defibrillator insertion  .Marland KitchenMarland KitchenMarland KitchenThe Medtronic Maximo VR, model 7232, single chamber    Family History: Family History  Problem Relation Age of Onset  . Sickle cell trait Daughter   . Other Father     Varicose veins  . Hypertension Father   . Diabetes Father     Controlled w/ diet and exercise  . Asthma Daughter   . Seizures Maternal Uncle   . Migraines Mother   . Ulcers Mother   . Rheum arthritis Mother   . Migraines Daughter   . Migraines Cousin     Maternal  . Cancer Paternal Grandmother     Stomach  . Cancer Maternal Uncle     Prostate  . Cancer Maternal Uncle     Lung  . Lupus Maternal Aunt   . Hypertension Paternal Aunt   . Other Daughter     blood transfusion @ birth;platelets were low  . Diabetes Maternal Aunt   . Diabetes Paternal Aunt     x 2  . Diabetes Paternal Uncle   . Hypertension    . Heart disease      Social History: Social History   Social History  . Marital status: Married    Spouse name:  Mailani Stormes  . Number of children: 1  . Years of education: 55   Occupational History  . 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 [place] History Main Topics  . Smoking status: Never Smoker  . Smokeless tobacco: Never Used  . Alcohol use No  . Drug use: No  . Sexual activity: Yes    Partners: Male    Birth control/ protection: Condom, Pill, None   Other Topics Concern  . None   Social History Narrative   ** Merged History Encounter **        Allergies:  Allergies  Allergen Reactions  . Percocet [Oxycodone-Acetaminophen] Shortness Of Breath  . Percocet [Oxycodone-Acetaminophen]     Objective:    Vital Signs:   Temp:  [98.1 F (36.7 C)-98.3 F (36.8 C)] 98.1 F (36.7 C) (10/09 0747) Pulse Rate:  [65-76] 65 (10/09 0747) Resp:  [14-21] 14 (10/09 0747) BP: (109-129)/(63-84) 109/63 (10/09 0747) SpO2:  [97 %-98 %] 97 % (10/09 0747) Weight:  [250 lb (113.4 kg)] 250 lb (113.4 kg) (10/09 0521)    Weight change: Filed Weights   05/04/16 0521  Weight: 250 lb (113.4 kg)    Intake/Output:  No intake or output data in the 24 hours ending 05/04/16 0932   Physical Exam: General:  Obese. Well appearing.  HEENT: Normal  Neck: supple. JVP difficult to assess with body habitus. Carotids 2+ bilat; no bruits. No lymphadenopathy or thyromegaly appreciated. Cor: PMI nondisplaced. Regular rate & rhythm. No rubs or murmurs.  Lungs: CTAB, normal effort Abdomen: Obese, soft, NT, ND, no HSM. No bruits or masses. +BS  Extremities: no cyanosis, clubbing, rash, 1+ ankle edema. Neuro: alert & orientedx3, cranial nerves grossly intact. moves all 4 extremities w/o difficulty. Affect pleasant but flat  Telemetry: Reviewed, NSR no acute ST-T abnormalities  Labs: Basic Metabolic Panel:  Recent Labs Lab 05/04/16 0534  NA 140  K 4.1  CL 107  CO2 27  GLUCOSE 152*  BUN 14  CREATININE 1.12*  CALCIUM 9.0    Liver Function Tests: No results for input(s):  AST, ALT, ALKPHOS, BILITOT, PROT, ALBUMIN in the last 168 hours. No results for input(s): LIPASE, AMYLASE in the last 168 hours. No results for input(s): AMMONIA in the last 168 hours.  CBC:  Recent Labs Lab 05/04/16 0534  WBC 9.0  HGB 11.3*  HCT 37.4  MCV 84.8  PLT 233    Cardiac Enzymes: No results for input(s): CKTOTAL, CKMB, CKMBINDEX, TROPONINI in the last 168 hours.  BNP: BNP (last 3 results)  Recent Labs  10/29/15 0945 05/04/16 0547  BNP 53.2 59.7    ProBNP (last 3 results)  Recent Labs  10/07/15 1644  PROBNP 40.0     CBG: No results for input(s): GLUCAP in the last 168 hours.  Coagulation Studies: No results for input(s): LABPROT, INR in the last 72 hours.  Other results: EKG: NSR 70 bpm  Imaging: Dg Chest 2 View  Result Date: 05/04/2016 CLINICAL DATA:  Awoke with chest and jaw pain.  Shortness of breath. EXAM: CHEST  2 VIEW COMPARISON:  10/07/2015 FINDINGS: Single lead left-sided pacemaker remains in place. Stable mild cardiomegaly, unchanged mediastinal contours. No pulmonary edema. No focal airspace disease, pleural effusion or pneumothorax. No acute osseous abnormality. IMPRESSION: Stable cardiomegaly.  No acute abnormality. Electronically Signed   By: Jeb Levering M.D.   On: 05/04/2016 05:58      Medications:     Current Medications: . furosemide  80 mg Intravenous Once  . potassium chloride  20 mEq Oral Once     Infusions:      Assessment   1. Atypical chest pain 2. Chronic systolic CHF - NICM s/p ICD likely r/t HTN, last echo 1/16 EF 25-30%. Cath 8/16 EF 35% No CAD. Echo 3/17 EF 55-60% 3. OSA 4. HTN 5. Asthma/Chronic bronchitis 6. Obesity  Plan    Atypical chest pain, seems more related to jaw pain.  No concerns for ACS.   Mild pedal edema > baseline per patient.   Further per Dr. Haroldine Laws.  Length of Stay: 0  [name] PA-C 05/04/2016, 9:32 AM  Advanced Heart Failure Team Pager [phone] (M-F;  7a - 4p)  Please contact [facility] for night-coverage after hours (4p -7a ) and weekends on [email or web address]  Patient seen and examined with Oda Kilts, PA-C. We discussed all aspects of the encounter. I agree with the assessment and plan as stated above.   Given recent normal cath in 9/16, normal ECG and normal troponins, I have very low suspicion for cardiac etiology here. D-dimer also negative. On exam perhaps mildly volume overload even though BNP normal. Would give one dose of IV lasix here and then can d/c home and f/u with PCP. We will see her back in HF Clinic next week. D/w Dr. Tamera Punt.   Arleene Settle,MD 10:28 AM

## 2016-05-04 NOTE — ED Notes (Signed)
To xay.

## 2016-05-04 NOTE — ED Notes (Signed)
Pt on call bell stating having active chest pain burning, sharp. Lasted a couple minutes and resolved without intervention. 12 lead x2 given to Dr. Dina Rich and made aware. Labs drawn.

## 2016-05-04 NOTE — ED Notes (Signed)
The pt is c/o rt and lt jaw pain since 0200am  She was awakened from sleep with the pain.  The pain radiated  Down into her mid-chest and into her rt shoulder.  She has had this pain once prior a year ago

## 2016-05-04 NOTE — ED Triage Notes (Signed)
Pt c/o jaw pain that radiates into neck and chest onset at 0230 that woke her up out her sleep. Pt reports that she feels very fatigue.

## 2016-05-04 NOTE — ED Provider Notes (Signed)
Patient has been seen by cardiology. It is felt that she can be discharged home after a dose of Lasix which she has been given. She will follow-up in one week with the CHF clinic.   Malvin Johns, MD 05/04/16 1041

## 2016-05-04 NOTE — ED Provider Notes (Signed)
[facility] DEPT Provider Note   CSN: [id number] Arrival date & time: 05/04/16  0510     History   Chief Complaint Chief Complaint  Patient presents with  . Chest Pain  . Jaw Pain    HPI [name] is a 40 y.o. female.  HPI  This is a 40 year old female with history of atrial fibrillation, congestive heart failure, hypertension who presents with chest pain and jaw pain. Patient reports that she had jaw pain that woke her up at 2:30. She states that the pain comes and goes. Described as discomfort It lasts for minutes at a time. She reports associated shortness of breath and diaphoresis. Currently she is pain-free. She states she had similar symptoms one year ago and was admitted to the hospital. At that time she had a normal cardiac catheterization. She reports generalized fatigue. Denies fevers or cough. Denies infectious symptoms. Reports increased lower extremity swelling "here recently" as well as worsening dyspnea on exertion.  Past Medical History:  Diagnosis Date  . Acute bronchitis and bronchiolitis   . Anemia    Iron supplements in the past  . Asthma    Inhaler prn;triggered by seasonal changes mostly cold weathert;last asthma  attack years ago  . Atrial fibrillation ([place])   . CHF (congestive heart failure) ([place]) 01/2006   Has Pacemaker and Defibrillator;was on heart meds;no longer taking since pregancy  . CHF (congestive heart failure) ([place])   . Hypertension    currently on Labetalol 200mg  BID  . Hypertension   . Infection    UTI;not frequent;last infection was in June  . Infection    Yeast;not frequent  . Migraines    Can't take Procardia XL, causes migraines  . Other primary cardiomyopathies   . Paroxysmal ventricular tachycardia ([place])   . Varicose veins 1998   Developed during last pregnancy    Patient Active Problem List   Diagnosis Date Noted  . Pre-operative cardiovascular examination 12/11/2015  . Cough variant asthma vs UACS 11/19/2015  .  Chronic diastolic heart failure ([place]) 10/29/2015  . Dyspnea 10/07/2015  . Chest pain 05/04/2015  . Bradycardia 05/04/2015  . Severe obesity (BMI >= 40) ([place]) 02/27/2014  . OSA (obstructive sleep apnea) 01/25/2014  . Chronic bronchitis ([place]) 01/25/2014  . URI (upper respiratory infection) 01/15/2014  . CHF (congestive heart failure) ([place]) 12/08/2013  . Uncontrolled hypertension 12/08/2013  . Acute on chronic systolic heart failure ([place]) 12/13/2012  . Acute renal failure ([place]) 12/13/2012  . Acute on chronic diastolic heart failure ([place]) 12/12/2012  . Hypertensive crisis 12/12/2012  . Acute respiratory failure ([place]) 12/12/2012  . Supervision of high-risk pregnancy 06/16/2012  . Low lying placenta with hemorrhage, antepartum 06/16/2012  . Previous cesarean delivery, delivered, with or without mention of antepartum condition 06/16/2012  . undesired fertility 06/16/2012  . History of pre-eclampsia in prior pregnancy, currently pregnant 06/02/2012  . Marginal placenta previa 05/24/2012  . Vaginal bleeding in pregnancy 05/24/2012  . Bilateral ovarian cysts 05/24/2012  . Anemia 05/05/2012  . BV (bacterial vaginosis) 04/04/2012  . Pacemaker 04/04/2012  . Cardiac defibrillator in situ 04/04/2012  . Hypertension complicating pregnancy [id number]  . Cardiomyopathy, hypertensive ([place]) 02/03/2012  . Dizziness 12/17/2011  . Chronic systolic heart failure ([place]) 04/13/2011  . Hypertension 03/05/2011  . VENTRICULAR TACHYCARDIA, PAROXYSMAL 10/02/2010  . ACUTE BRONCHITIS 08/30/2009  . CARDIOMYOPATHY 08/27/2009  . Atrial fibrillation ([place]) 08/27/2009  . CHF 08/27/2009    Past Surgical History:  Procedure Laterality Date  . BREAST REDUCTION SURGERY  03/05/1994   d/t back, shoulder, and neck pain (42 F)  . CARDIAC CATHETERIZATION     EF 30-35%  . CARDIAC CATHETERIZATION N/A 05/04/2015   Procedure: Left Heart Cath and Coronary Angiography;  Surgeon: Belva Crome, MD;  Location: [facility]  [facility];  Service: Cardiovascular;  Laterality: N/A;  . CARDIAC DEFIBRILLATOR PLACEMENT    . CESAREAN SECTION  1998   d/t preeclampsia  . IMPLANTABLE CARDIOVERTER DEFIBRILLATOR (ICD) GENERATOR CHANGE N/A 09/10/2014   Procedure: ICD GENERATOR CHANGE;  Surgeon: Evans Lance, MD;  Location: [facility] CATH LAB;  Service: Cardiovascular;  Laterality: N/A;  . INSERT / REPLACE / REMOVE PACEMAKER   02/10/2006   Status post implantable cardioverter-defibrillator insertion  .Marland KitchenMarland KitchenMarland KitchenThe Medtronic Maximo VR, model [id number], single chamber    OB History    Gravida Para Term Preterm AB Living   2 1   1   1    SAB TAB Ectopic Multiple Live Births           1       Home Medications    Prior to Admission medications   Medication Sig Start Date End Date Taking? Authorizing Provider  albuterol (PROVENTIL HFA;VENTOLIN HFA) 108 (90 Base) MCG/ACT inhaler Inhale 2 puffs into the lungs every 6 (six) hours as needed for wheezing or shortness of breath. Rescue inhaler 09/12/15   Jolaine Artist, MD  amLODipine (NORVASC) 2.5 MG tablet Take 1 tablet (2.5 mg total) by mouth daily. 10/17/15   Jolaine Artist, MD  carvedilol (COREG) 25 MG tablet TAKE 1 TABLET BY MOUTH 2 TIMES DAILY WITH A MEAL. 09/17/15   Jolaine Artist, MD  cetirizine (ZYRTEC) 10 MG tablet Take 10 mg by mouth daily as needed for allergies or rhinitis.     Historical Provider, MD  Ferrous Gluconate (IRON 27 PO) Take by mouth.    Historical Provider, MD  ferrous sulfate 325 (65 FE) MG tablet Take 1 tablet (325 mg total) by mouth 3 (three) times daily with meals. 10/08/15   Tanda Rockers, MD  fluconazole (DIFLUCAN) 100 MG tablet Take 1 tablet (100 mg total) by mouth daily. 11/27/15   Tanda Rockers, MD  hydrALAZINE (APRESOLINE) 50 MG tablet Take 1.5 tablets (75 mg total) by mouth 3 (three) times daily. 09/12/15   Jolaine Artist, MD  isosorbide mononitrate (IMDUR) 60 MG 24 hr tablet Take 1 tablet (60 mg total) by mouth daily. 10/17/15   Jolaine Artist, MD    meperidine (DEMEROL) 50 MG tablet Take 1 tablet (50 mg total) by mouth every 4 (four) hours as needed for severe pain. 11/18/15   Tanda Rockers, MD  mometasone-formoterol [facility]) 100-5 MCG/ACT AERO Take 2 puffs first thing in am and then another 2 puffs about 12 hours later. 11/18/15   Tanda Rockers, MD  potassium chloride SA (K-DUR,KLOR-CON) 20 MEQ tablet Take 1 tablet (20 mEq total) by mouth 2 (two) times daily. 10/29/15   [name], PA-C  ranitidine (ZANTAC) 150 MG tablet Take 150 mg by mouth 2 (two) times daily.    Historical Provider, MD  Respiratory Therapy Supplies (FLUTTER) DEVI Use as directed 11/18/15   Tanda Rockers, MD  sacubitril-valsartan (ENTRESTO) 97-103 MG Take 1 tablet by mouth 2 (two) times daily. 01/31/16   Jolaine Artist, MD  spironolactone (ALDACTONE) 25 MG tablet Take 0.5 tablets (12.5 mg total) by mouth daily. Please call to schedule appt for further refills [phone] - 1st attempt 04/03/16  Larey Dresser, MD  torsemide (DEMADEX) 20 MG tablet Take 20-60 mg by mouth 2 (two) times daily. Reported on 10/29/2015    Historical Provider, MD  torsemide (DEMADEX) 20 MG tablet TAKE 3 TABS BY MOUTH EVERY MORNING AND 1 TAB EVERY EVENING 10/29/15   [name], PA-C    Family History Family History  Problem Relation Age of Onset  . Sickle cell trait Daughter   . Other Father     Varicose veins  . Hypertension Father   . Diabetes Father     Controlled w/ diet and exercise  . Asthma Daughter   . Seizures Maternal Uncle   . Migraines Mother   . Ulcers Mother   . Rheum arthritis Mother   . Migraines Daughter   . Migraines Cousin     Maternal  . Cancer Paternal Grandmother     Stomach  . Cancer Maternal Uncle     Prostate  . Cancer Maternal Uncle     Lung  . Lupus Maternal Aunt   . Hypertension Paternal Aunt   . Other Daughter     blood transfusion @ birth;platelets were low  . Diabetes Maternal Aunt   . Diabetes Paternal Aunt     x 2  .  Diabetes Paternal Uncle   . Hypertension    . Heart disease      Social History Social History  Substance Use Topics  . Smoking status: Never Smoker  . Smokeless tobacco: Never Used  . Alcohol use No     Allergies   Percocet [oxycodone-acetaminophen] and Percocet [oxycodone-acetaminophen]   Review of Systems Review of Systems  Constitutional: Negative for fever.  Respiratory: Positive for shortness of breath. Negative for cough.   Cardiovascular: Positive for chest pain and leg swelling.  Gastrointestinal: Negative for abdominal pain, nausea and vomiting.  Neurological: Negative for headaches.  All other systems reviewed and are negative.    Physical Exam Updated Vital Signs BP 129/75   Pulse 76   Temp 98.3 F (36.8 C)   Resp 18   Ht 5\' 1"  (1.549 m)   Wt 250 lb (113.4 kg)   LMP 04/06/2016   SpO2 98%   BMI 47.24 kg/m   Physical Exam  Constitutional: She is oriented to person, place, and time. No distress.  Obese  HENT:  Head: Normocephalic and atraumatic.  Cardiovascular: Normal rate, regular rhythm and normal heart sounds.   No murmur heard. Pulmonary/Chest: Effort normal and breath sounds normal. No respiratory distress. She has no wheezes.  Abdominal: Soft. Bowel sounds are normal. There is no tenderness. There is no guarding.  Musculoskeletal: She exhibits edema.  2+ lower extremity edema  Neurological: She is alert and oriented to person, place, and time.  Skin: Skin is warm and dry.  Psychiatric: She has a normal mood and affect.  Nursing note and vitals reviewed.    ED Treatments / Results  Labs (all labs ordered are listed, but only abnormal results are displayed) Labs Reviewed  BASIC METABOLIC PANEL - Abnormal; Notable for the following:       Result Value   Glucose, Bld 152 (*)    Creatinine, Ser 1.12 (*)    All other components within normal limits  CBC - Abnormal; Notable for the following:    Hemoglobin 11.3 (*)    MCH 25.6 (*)     RDW 17.1 (*)    All other components within normal limits  D-DIMER, QUANTITATIVE (NOT AT [facility]) - Abnormal; Notable  for the following:    D-Dimer, Quant 0.63 (*)    All other components within normal limits  BRAIN NATRIURETIC PEPTIDE  I-STAT TROPOININ, ED  I-STAT TROPOININ, ED    EKG  EKG Interpretation  Date/Time:  Monday May 04 2016 05:17:27 EDT Ventricular Rate:  70 PR Interval:  226 QRS Duration: 88 QT Interval:  406 QTC Calculation: 438 R Axis:   6 Text Interpretation:  Sinus rhythm with 1st degree A-V block Low voltage QRS Borderline ECG Confirmed by Dina Rich  MD, Loma Sousa ([id number]) on 05/04/2016 5:27:06 AM       Radiology Dg Chest 2 View  Result Date: 05/04/2016 CLINICAL DATA:  Awoke with chest and jaw pain.  Shortness of breath. EXAM: CHEST  2 VIEW COMPARISON:  10/07/2015 FINDINGS: Single lead left-sided pacemaker remains in place. Stable mild cardiomegaly, unchanged mediastinal contours. No pulmonary edema. No focal airspace disease, pleural effusion or pneumothorax. No acute osseous abnormality. IMPRESSION: Stable cardiomegaly.  No acute abnormality. Electronically Signed   By: Jeb Levering M.D.   On: 05/04/2016 05:58    Procedures Procedures (including critical care time)  Medications Ordered in ED Medications - No data to display   Initial Impression / Assessment and Plan / ED Course  I have reviewed the triage vital signs and the nursing notes.  Pertinent labs & imaging results that were available during my care of the patient were reviewed by me and considered in my medical decision making (see chart for details).  Clinical Course    Pt presents with chest pain. Currently pain-free.  EKG and initial troponin reassuring. Reassuring workup one year ago including cardiac catheterization. History suggestive of at least mild CHF given increased lower extremity swelling and shortness of breath. Chest x-ray without overt volume overload. Initially discussed with  Dr. Radford Pax cardiology. Plan to repeat troponin and have her follow-up with Dr. Haroldine Laws as scheduled this afternoon. However, patient had recurrence of symptoms. Repeat EKG reassuring. Cardiology to evaluate. Disposition pending.  Final Clinical Impressions(s) / ED Diagnoses   Final diagnoses:  None    New Prescriptions New Prescriptions   No medications on file     Merryl Hacker, MD 05/04/16 2346

## 2016-05-08 ENCOUNTER — Other Ambulatory Visit (HOSPITAL_COMMUNITY): Payer: Self-pay | Admitting: Cardiology

## 2016-05-08 MED FILL — ISOSORBIDE MN ER 60 MG TAB: 60 | 30 days supply | Qty: 30 | Fill #6

## 2016-05-08 MED FILL — hydrALAZINE HCL 50 MG TABS: 50 | 30 days supply | Qty: 135 | Fill #2

## 2016-05-08 MED FILL — AMLODIPINE BESYLATE 2.5 MG: 2.5 | 30 days supply | Qty: 30 | Fill #6

## 2016-05-08 MED FILL — SPIRONOLACTONE 25 MG TABLET: 25 | 30 days supply | Qty: 15 | Fill #0

## 2016-05-08 MED FILL — CARVEDILOL 25 MG TABLET: 25 | 30 days supply | Qty: 60 | Fill #4

## 2016-05-12 ENCOUNTER — Encounter (HOSPITAL_COMMUNITY): Payer: Self-pay

## 2016-05-12 ENCOUNTER — Ambulatory Visit (HOSPITAL_COMMUNITY)
Admission: RE | Admit: 2016-05-12 | Discharge: 2016-05-12 | Disposition: A | Discharge status: Home / Self Care | Payer: BLUE CROSS/BLUE SHIELD | Source: Ambulatory Visit | Attending: Cardiology | Admitting: Cardiology

## 2016-05-12 VITALS — BP 118/82 | HR 78 | Wt 258.8 lb

## 2016-05-12 DIAGNOSIS — Z79899 Other long term (current) drug therapy: Secondary | ICD-10-CM | POA: Insufficient documentation

## 2016-05-12 DIAGNOSIS — J45909 Unspecified asthma, uncomplicated: Secondary | ICD-10-CM | POA: Diagnosis not present

## 2016-05-12 DIAGNOSIS — I110 Hypertensive heart disease with heart failure: Secondary | ICD-10-CM | POA: Diagnosis not present

## 2016-05-12 DIAGNOSIS — I10 Essential (primary) hypertension: Secondary | ICD-10-CM

## 2016-05-12 DIAGNOSIS — J42 Unspecified chronic bronchitis: Secondary | ICD-10-CM | POA: Diagnosis not present

## 2016-05-12 DIAGNOSIS — Z95810 Presence of automatic (implantable) cardiac defibrillator: Secondary | ICD-10-CM | POA: Insufficient documentation

## 2016-05-12 DIAGNOSIS — G4733 Obstructive sleep apnea (adult) (pediatric): Secondary | ICD-10-CM | POA: Insufficient documentation

## 2016-05-12 DIAGNOSIS — I428 Other cardiomyopathies: Secondary | ICD-10-CM | POA: Insufficient documentation

## 2016-05-12 DIAGNOSIS — E669 Obesity, unspecified: Secondary | ICD-10-CM | POA: Insufficient documentation

## 2016-05-12 DIAGNOSIS — I5020 Unspecified systolic (congestive) heart failure: Secondary | ICD-10-CM

## 2016-05-12 DIAGNOSIS — I5022 Chronic systolic (congestive) heart failure: Secondary | ICD-10-CM | POA: Insufficient documentation

## 2016-05-12 MED ORDER — SPIRONOLACTONE 25 MG PO TABS: 12.5000 mg | ORAL_TABLET | Freq: Every day | ORAL | 0 refills | Status: DC

## 2016-05-12 MED ORDER — SACUBITRIL-VALSARTAN 24-26 MG PO TABS: 1.0000 | ORAL_TABLET | Freq: Two times a day (BID) | ORAL | 3 refills | Status: DC

## 2016-05-12 NOTE — Patient Instructions (Signed)
Refilled Spironolactone.  STOP Entresto 97/103 mg tablets.  START Entresto 24/26 mg tablets: Take 1 tablet twice daily.  Follow up with [facility] CHF clinical pharmacist in 2 weeks.  Follow up with Oda Kilts PA-C in 4 weeks.  Do the following things EVERYDAY: 1) Weigh yourself in the morning before breakfast. Write it down and keep it in a log. 2) Take your medicines as prescribed 3) Eat low salt foods-Limit salt (sodium) to 2000 mg per day.  4) Stay as active as you can everyday 5) Limit all fluids for the day to less than 2 liters

## 2016-05-12 NOTE — Progress Notes (Signed)
Advanced Heart Failure Medication Review by a Pharmacist  Does the patient  feel that his/her medications are working for him/her?  yes  Has the patient been experiencing any side effects to the medications prescribed?  no  Does the patient measure his/her own blood pressure or blood glucose at home?  yes   Does the patient have any problems obtaining medications due to transportation or finances?   no  Understanding of regimen: good Understanding of indications: good Potential of compliance: fair Patient understands to avoid NSAIDs. Patient understands to avoid decongestants.  Issues to address at subsequent visits: Compliance/BCBS coverage of Entresto   Pharmacist comments:  [name] is a pleasant 40 yo F presenting without a medication list but with good recall of her regimen. She reports fair compliance with her regimen but does report not having Entresto 97-103 mg BID for about a week 2/2 expiration of [employer] patient assistance. She also has not had spironolactone for at least 3 days 2/2 running out of refills. She now has [employer] and I will work on getting a PA for her. She can also utilize the manufacturer copay card to assist with her copays.   [name]. [name], PharmD, BCPS, CPP Clinical Pharmacist Pager: [phone] Phone: [phone] 05/12/2016 3:24 PM      Time with patient: 10 minutes Preparation and documentation time: 2 minutes Total time: 12 minutes

## 2016-05-12 NOTE — Progress Notes (Signed)
Patient ID: [name], female   DOB: 1975-12-25, 40 y.o.   MRN: [id number]   ADVANCED HF CLINIC NOTE  PCP: Dr. Heath Gold  OB:  Dr. Leo Grosser  EP: Dr. Lovena Le CHF: [place]  HPI: Talia is a 40 year old woman with h/o obesity, chronic systolic heart failure due to nonischemic cardiomyopathy (cath 2007. No CAD) s/p Medtronic ICD implantation, asthma/chronic bronchitis and severe hypertension.   ECHOs 04/27/2011 45-50%.   08/20/11 ECHO 55-60%   05/19/12 EF ~40%   07/01/12 EF ~40%   08/02/12 EF~40%    2/14 EF ~50%                          5/14 EF ~35%                          1/16 EF 25-30%                           3/17 EF 55-60% Grade I DD                           CPX 01/2014: Resting HR 75, Peak HR 121 Peak VO2 15.2, 74.4% predicted VE/VCO2 slope: 26.4 OUES: 1.93 Peak RER 1.15 VE/MVV 61.7%  Seen in ED 05/04/16 was having atypical chest pain and jaw pain. Thought to be mildly volume overloaded.   She presents today for regular follow up.  Has felt much better since ED visit a week ago. No further pain. Denies SOB. Was having dizzy spells last week, but states it also affected her lying down. "feels like room spinning." Pt has been out of Arlyce Harman and [place] for > 1 week. Peripheral edema comes and goes. Has switched insurances, so is re-looking into gastric sleeve and looking to proceed.  Still watching diet and exercising. Despite 1-2 hrs of working out still had trouble losing weight. No orthopnea/PND. No CP.   LABS: 01/03/14: K 4.2 Cr 1.27 pBNP 1766 (down from 4070)            02/13/14: K 4.4, creatinine 1.27            05/15/14: K 4.0, Creatinine 1.36  08/29/14: K 3.5, Creatinine 1.17  SH: Married. Lives in [place], 2 children. Working at [employer]. No ETOH or smoking  FH: Mother living: DM2, HTN, no CAD        Father living: DM2, HTN  ROS: All systems negative except as listed in HPI, PMH and Problem List.  Past Medical History:  Diagnosis Date  . Acute bronchitis and  bronchiolitis   . Anemia    Iron supplements in the past  . Asthma    Inhaler prn;triggered by seasonal changes mostly cold weathert;last asthma  attack years ago  . Atrial fibrillation ([place])   . CHF (congestive heart failure) ([place]) 01/2006   Has Pacemaker and Defibrillator;was on heart meds;no longer taking since pregancy  . CHF (congestive heart failure) ([place])   . Hypertension    currently on Labetalol 200mg  BID  . Hypertension   . Infection    UTI;not frequent;last infection was in June  . Infection    Yeast;not frequent  . Migraines    Can't take Procardia XL, causes migraines  . Other primary cardiomyopathies   . Paroxysmal ventricular tachycardia ([place])   . Varicose veins 1998   Developed during last pregnancy  Current Outpatient Prescriptions  Medication Sig Dispense Refill  . albuterol (PROVENTIL HFA;VENTOLIN HFA) 108 (90 Base) MCG/ACT inhaler Inhale 2 puffs into the lungs every 6 (six) hours as needed for wheezing or shortness of breath. Rescue inhaler 1 Inhaler 1  . amLODipine (NORVASC) 2.5 MG tablet Take 1 tablet (2.5 mg total) by mouth daily. 30 tablet 6  . CALCIUM PO Take 1 tablet by mouth 3 (three) times daily.    . carvedilol (COREG) 25 MG tablet TAKE 1 TABLET BY MOUTH 2 TIMES DAILY WITH A MEAL. 60 tablet 6  . cetirizine (ZYRTEC) 10 MG tablet Take 10 mg by mouth daily as needed for allergies or rhinitis.     . ferrous sulfate 325 (65 FE) MG tablet Take 1 tablet (325 mg total) by mouth 3 (three) times daily with meals. 90 tablet 3  . hydrALAZINE (APRESOLINE) 50 MG tablet Take 1.5 tablets (75 mg total) by mouth 3 (three) times daily. 135 tablet 6  . isosorbide mononitrate (IMDUR) 60 MG 24 hr tablet Take 1 tablet (60 mg total) by mouth daily. 30 tablet 6  . potassium chloride SA (K-DUR,KLOR-CON) 20 MEQ tablet Take 1 tablet (20 mEq total) by mouth 2 (two) times daily. 60 tablet 3  . ranitidine (ZANTAC) 150 MG tablet Take 150 mg by mouth 2 (two) times daily.    Marland Kitchen  Respiratory Therapy Supplies (FLUTTER) DEVI Use as directed 1 each 0  . torsemide (DEMADEX) 20 MG tablet TAKE 3 TABS BY MOUTH EVERY MORNING AND 1 TAB EVERY EVENING 120 tablet 6  . sacubitril-valsartan (ENTRESTO) 97-103 MG Take 1 tablet by mouth 2 (two) times daily. (Patient not taking: Reported on 05/12/2016) 60 tablet 6  . spironolactone (ALDACTONE) 25 MG tablet Take 0.5 tablets (12.5 mg total) by mouth daily. (Patient not taking: Reported on 05/12/2016) 15 tablet 0   No current facility-administered medications for this encounter.     Vitals:   05/12/16 1520  BP: 118/82  Pulse: 78  SpO2: 96%  Weight: 258 lb 12.8 oz (117.4 kg)   Wt Readings from Last 3 Encounters:  05/12/16 258 lb 12.8 oz (117.4 kg)  05/04/16 250 lb (113.4 kg)  02/24/16 257 lb 14.4 oz (117 kg)     PHYSICAL EXAM: General: Well appearing; No resp difficulty.  HEENT: normal Neck: supple. JVP difficult 2/2 body habitus but does not appear elevated.Carotids 2+ bilaterally; no bruits. No thyromegaly or nodule noted.  Cor: PMI normal. RRR. +s4. No murmurs appreciated Lungs: CTAB, normal effort. Abdomen: obese soft, NT, ND, no HSM. No bruits or masses. +BS  Extremities: no cyanosis, clubbing, rash, Trace ankle edema Neuro: alert & orientedx3, cranial nerves grossly intact. Moves all 4 extremities w/o difficulty. Affect pleasant.  ASSESSMENT & PLAN:  1) Chronic systolic heart failure, NICM s/p ICD likely r/t HTN, last echo 1/16 EF 25-30%. Cath 8/16 EF 35% No CAD. Echo 3/17 EF 55-60% - EF normal in March. NYHA II.  - Volume status stable.  - Continue torsemide 60 mg q am and 20 mg q pm.  Can increase evening dose as needed for swelling.  - Continue hydralazine 75 mg TID, Imdur 60 mg daily - Restart spiro 12.5 mg daily.  - Restart Entresto at 24/26 mg BID with some dizziness and stable BP.  - Continue carvedilol 25 bid 2) OSA  -  Has got CPA but feels it is too much pressure. Wil lcall [facility].  3) HTN - Adding back HF  meds as above.  4)  Asthma/chronic bronchitis - Stable. 5) Obesity: - In process of education for gastric sleeve. She is cleared from a cardiac perspective to proceed. Needs further insurance clearance.  Has been off Arlyce Harman and [place] for > 1 week.  Blood pressure stable and having occasional dizziness, so we will re-titrate her Entresto up from starting dose over next month as tolerated.  Will recheck BMET in 2 weeks with Pharm visit with recent stable labs at ED visit.   [name], PA-C 3:24 PM  Total time spent > 25 minutes. Over half that spent discussing the above.

## 2016-05-13 ENCOUNTER — Encounter (HOSPITAL_COMMUNITY): Payer: Self-pay

## 2016-05-25 ENCOUNTER — Encounter: Payer: Self-pay | Admitting: Genetic Counselor

## 2016-05-26 ENCOUNTER — Other Ambulatory Visit: Payer: BLUE CROSS/BLUE SHIELD

## 2016-05-26 ENCOUNTER — Ambulatory Visit (HOSPITAL_BASED_OUTPATIENT_CLINIC_OR_DEPARTMENT_OTHER): Payer: BLUE CROSS/BLUE SHIELD | Admitting: Genetic Counselor

## 2016-05-26 ENCOUNTER — Encounter: Payer: Self-pay | Admitting: Genetic Counselor

## 2016-05-26 DIAGNOSIS — Z809 Family history of malignant neoplasm, unspecified: Secondary | ICD-10-CM

## 2016-05-26 DIAGNOSIS — Z8051 Family history of malignant neoplasm of kidney: Secondary | ICD-10-CM

## 2016-05-26 DIAGNOSIS — Z8041 Family history of malignant neoplasm of ovary: Secondary | ICD-10-CM | POA: Diagnosis not present

## 2016-05-26 DIAGNOSIS — Z803 Family history of malignant neoplasm of breast: Secondary | ICD-10-CM

## 2016-05-26 DIAGNOSIS — Z801 Family history of malignant neoplasm of trachea, bronchus and lung: Secondary | ICD-10-CM

## 2016-05-26 DIAGNOSIS — Z8049 Family history of malignant neoplasm of other genital organs: Secondary | ICD-10-CM | POA: Diagnosis not present

## 2016-05-26 DIAGNOSIS — Z315 Encounter for genetic counseling: Secondary | ICD-10-CM

## 2016-05-26 DIAGNOSIS — Z8042 Family history of malignant neoplasm of prostate: Secondary | ICD-10-CM | POA: Diagnosis not present

## 2016-05-27 ENCOUNTER — Encounter: Payer: Self-pay | Admitting: Genetic Counselor

## 2016-05-27 DIAGNOSIS — Z803 Family history of malignant neoplasm of breast: Secondary | ICD-10-CM | POA: Insufficient documentation

## 2016-05-27 DIAGNOSIS — Z8041 Family history of malignant neoplasm of ovary: Secondary | ICD-10-CM | POA: Insufficient documentation

## 2016-05-27 NOTE — Progress Notes (Signed)
REFERRING PROVIDER: Janyth Pupa, DO [address] [place], [place] [address]  PRIMARY PROVIDER:  No PCP Per Patient  PRIMARY REASON FOR VISIT:  1. Family history of ovarian cancer   2. Family history of breast cancer in female   3. Family history of prostate cancer   4. Family history of uterine cancer   5. Family history of kidney cancer   6. Family history of lung cancer   7. Family history of cancer      HISTORY OF PRESENT ILLNESS:   [name], a 40 y.o. female, was seen for a [facility] cancer genetics consultation at the request of Dr. Nelda Marseille due to a family history of ovarian, breast, uterine, and other cancers.  [name] presents to clinic today to discuss the possibility of a hereditary predisposition to cancer, genetic testing, and to further clarify her future cancer risks, as well as potential cancer risks for family members.    [name] is a 40 y.o. female with no personal history of cancer.     HORMONAL RISK FACTORS:  Menarche was at age 37.  First live birth at age 58.  OCP use for approximately 5 years.  Ovaries intact: yes.  Hysterectomy: no.  Menopausal status: premenopausal.  HRT use: 0 years. Colonoscopy: no; not examined. Mammogram within the last year: no. Number of breast biopsies: 0. Up to date with pelvic exams:  yes. Any excessive radiation exposure/other exposures in the past:  Reports history of secondhand smoke exposure (mother smoked)  Past Medical History:  Diagnosis Date  . Acute bronchitis and bronchiolitis   . Anemia    Iron supplements in the past  . Asthma    Inhaler prn;triggered by seasonal changes mostly cold weathert;last asthma  attack years ago  . Atrial fibrillation ([place])   . CHF (congestive heart failure) ([place]) 01/2006   Has Pacemaker and Defibrillator;was on heart meds;no longer taking since pregancy  . CHF (congestive heart failure) ([place])   . Hypertension    currently on Labetalol 229m BID  . Hypertension    . Infection    UTI;not frequent;last infection was in June  . Infection    Yeast;not frequent  . Migraines    Can't take Procardia XL, causes migraines  . Other primary cardiomyopathies   . Paroxysmal ventricular tachycardia ([place]   . Varicose veins 1998   Developed during last pregnancy    Past Surgical History:  Procedure Laterality Date  . BREAST REDUCTION SURGERY  03/05/1994   d/t back, shoulder, and neck pain (42 F)  . CARDIAC CATHETERIZATION     EF 30-35%  . CARDIAC CATHETERIZATION N/A 05/04/2015   Procedure: Left Heart Cath and Coronary Angiography;  Surgeon: HBelva Crome MD;  [facility];  Service: Cardiovascular;  Laterality: N/A;  . CARDIAC DEFIBRILLATOR PLACEMENT    . CESAREAN SECTION  1998   d/t preeclampsia  . IMPLANTABLE CARDIOVERTER DEFIBRILLATOR (ICD) GENERATOR CHANGE N/A 09/10/2014   Procedure: ICD GENERATOR CHANGE;  Surgeon: GEvans Lance MD;  [facility];  Service: Cardiovascular;  Laterality: N/A;  . INSERT / REPLACE / REMOVE PACEMAKER   02/10/2006   Status post implantable cardioverter-defibrillator insertion  ..Marland KitchenMarland KitchenMarland Kitchenhe Medtronic Maximo VR, model 74825 single chamber    Social History   Social History  . Marital status: Married    Spouse name: HMaddux First . Number of children: 1  . Years of education: 141  Occupational History  . Asst.  Manager     [employer]  . [place] History Main Topics  . Smoking status: Never Smoker  . Smokeless tobacco: Never Used  . Alcohol use No  . Drug use: No  . Sexual activity: Yes    Partners: Male    Birth control/ protection: Condom, Pill, None   Other Topics Concern  . Not on file   Social History Narrative   ** Merged History Encounter **         FAMILY HISTORY:  We obtained a detailed, 4-generation family history.  Significant diagnoses are listed below: Family History  Problem Relation Age of Onset  . Sickle cell trait Daughter   . Other  Father     Varicose veins  . Hypertension Father   . Diabetes Father     Controlled w/ diet and exercise  . Colon polyps Father     approx 3-4  . Asthma Daughter   . Seizures Maternal Uncle   . Migraines Mother   . Ulcers Mother   . Rheum arthritis Mother   . Colon polyps Mother     approx 2 polyps  . Migraines Daughter   . Migraines Cousin     Maternal  . Uterine cancer Paternal Grandmother     d. 60-61  . Prostate cancer Maternal Uncle     dx 69-70; surgery and rad  . Lung cancer Maternal Uncle 65    d. 69-70; smoker  . Lupus Maternal Aunt   . Hypertension Paternal Aunt   . Other Daughter     blood transfusion @ birth;platelets were low  . Diabetes Maternal Aunt   . Lung cancer Maternal Aunt     dx 71-72; +chemo  . Diabetes Paternal Aunt     x 2  . Diabetes Paternal Uncle   . Hypertension    . Heart disease    . Cancer Cousin     maternal 1st cousin d. early 25s; NOS cancer  . Breast cancer Cousin     maternal 1st cousin dx 73-48  . Breast cancer Other     maternal great aunt (MGM's sister) d. breast cancer in her late 60s  . Kidney cancer Cousin     paternal 1st cousin dx 82s; nephrectomy; not a smoker  . Ovarian cancer Paternal Aunt 42    paternal aunt (father's maternal half-sister)  . Breast cancer Paternal Aunt   . Ovarian cancer Paternal Aunt   . Uterine cancer Paternal Aunt   . Cancer Other     paternal great aunt (PGM's sister) dx NOS cancer  . Cancer Other     paternal great uncle (PGM's brother) dx NOS cancer  . Colon cancer Neg Hx     [name] has two daughters, ages 30 and 48.  [name] is an only child.  Her mother is currently 37 and has never had cancer.  She has a history of approximately two colon polyps.  [name] father is currently 46 and has never had cancer.  He has a history of approximately 3-4 colon polyps.  [name] mother has five full brothers, one full sister, and two maternal half-siblings.  Two brothers were murdered  in their 24s-30s.  One brother was a smoker and was diagnosed with lung cancer at 76 and passed away at 8-70.  This brother had nine children, one of whom died of an unspecified type of cancer in her early 26s.  Two other full brothers  and her full sister are in their 28s and have never had cancer.  Her maternal half-brother was diagnosed with prostate cancer at 71-70, and her maternal half-sister was diagnosed with lung cancer in her early 65s.  [name] has another maternal first cousin who was diagnosed with breast cancer at age 26-48.  [name] maternal grandmother died at 31 and never had cancer.  Her grandmother had one sister who died of breast cancer in her late 56s.  [name] maternal grandfather died in his 65s, but she has no further information for his health and cause of death.  [name] father has two full brothers and three full sisters, all of whom have passed away, most at older ages from heart-related causes or diabetes.  He also has one maternal half-sister who is currently 74 and was recently diagnosed with breast cancer.  He has several paternal half-siblings, one of whom was diagnosed with breast, ovarian, and uterine cancers and passed away in her early 50s.  One of [name]'s maternal first cousins was diagnosed with kidney cancer in his 87s.  [name] maternal grandmother died of uterine cancer in her early 93s.  Her grandmother had one brother and one sister who died of unspecified types of cancer.  [name] paternal grandfather died at 49, and she has no further information for him or his cause of death.    [name] reports no known family history of genetic testing for hereditary cancer risks.  Patient's maternal ancestors are of [place] and Native American/Choctaw descent, and paternal ancestors are of [place] American, Caucasian, and [place] descent. There is no reported Ashkenazi Jewish ancestry. There is no known consanguinity.  GENETIC COUNSELING  ASSESSMENT: [name] is a 40 y.o. female with a family history of cancer which is somewhat suggestive of a hereditary cancer syndrome and predisposition to cancer. We, therefore, discussed and recommended the following at today's visit.   DISCUSSION: We reviewed the characteristics, features and inheritance patterns of hereditary cancer syndromes, particularly those caused by mutations in the BRCA1/2 and Lynch syndrome genes. We also discussed genetic testing, including the appropriate family members to test, the process of testing, insurance coverage and turn-around-time for results. We discussed the implications of a negative, positive and/or variant of uncertain significant result. We recommended [name] pursue genetic testing for the 32-gene Comprehensive Cancer Panel with MSH2 Exons 1-7 Inversion Analysis through [facility]. The Comprehensive Cancer Panel offered by [facility] Junius Roads, MD) includes sequencing and/or deletion duplication testing of the following 32 genes: APC, ATM, AXIN2, BARD1, BMPR1A, BRCA1, BRCA2, BRIP1, CDH1, CDK4, CDKN2A, CHEK2, EPCAM, FANCC, MLH1, MSH2, MSH6, MUTYH, NBN, PALB2, PMS2, POLD1, POLE, PTEN, RAD51C, RAD51D, SCG5/GREM1, SMAD4, STK11, TP53, VHL, and XRCC2.    Based on Ms. Lafoy's family history of cancer, she meets medical criteria for genetic testing. Despite that she meets criteria, she may still have an out of pocket cost. We discussed that if her out of pocket cost for testing is over $100, the laboratory will call and confirm whether she wants to proceed with testing.  If the out of pocket cost of testing is less than $100 she will be billed by the genetic testing laboratory.   Based on the patient's personal and family history, a statistical model (IBIS/Tyrer-Cuzick)  and literature data were used to estimate her risk of developing breast cancer. This estimates her lifetime risk of developing breast cancer to be approximately 16.0%. This  estimation does not take into account  any genetic testing results.  The patient's lifetime breast cancer risk is a preliminary estimate based on available information using one of several models endorsed by the [place] (ACS). The ACS recommends consideration of breast MRI screening as an adjunct to mammography for patients at high risk (defined as 20% or greater lifetime risk). A more detailed breast cancer risk assessment can be considered, if clinically indicated.   PLAN: After considering the risks, benefits, and limitations, Ms. Delange  provided informed consent to pursue genetic testing and the blood sample was sent to [facility] for analysis of the 32-gene Comprehensive Cancer Panel with MSH2 Exons 1-7 Inversion Analysis. Results should be available within approximately 2-3 weeks' time, at which point they will be disclosed by telephone to Ms. Scipio, as will any additional recommendations warranted by these results. Ms. Tenorio will receive a summary of her genetic counseling visit and a copy of her results once available. This information will also be available in Epic. We encouraged Ms. Deeg to remain in contact with cancer genetics annually so that we can continuously update the family history and inform her of any changes in cancer genetics and testing that may be of benefit for her family. Ms. Sachdeva questions were answered to her satisfaction today. Our contact information was provided should additional questions or concerns arise.  Thank you for the referral and allowing [place] to share in the care of your patient.   Jeanine Luz, MS, [facility] Certified Genetic Counselor [email or web address] Phone: [phone]  The patient was seen for a total of 60 minutes in face-to-face genetic counseling.  This patient was discussed with Drs. Magrinat, Lindi Adie and/or Burr Medico who agrees with the above.    _______________________________________________________________________ For  Office Staff:  Number of people involved in session: 1 Was an Intern/ student involved with case: no

## 2016-06-01 ENCOUNTER — Encounter (HOSPITAL_COMMUNITY): Payer: Self-pay

## 2016-06-01 ENCOUNTER — Ambulatory Visit (HOSPITAL_COMMUNITY)
Admission: RE | Admit: 2016-06-01 | Discharge: 2016-06-01 | Disposition: A | Discharge status: Home / Self Care | Payer: BLUE CROSS/BLUE SHIELD | Source: Ambulatory Visit | Attending: Internal Medicine | Admitting: Internal Medicine

## 2016-06-01 VITALS — BP 180/110 | HR 87 | Wt 262.2 lb

## 2016-06-01 DIAGNOSIS — Z95810 Presence of automatic (implantable) cardiac defibrillator: Secondary | ICD-10-CM | POA: Diagnosis not present

## 2016-06-01 DIAGNOSIS — J45909 Unspecified asthma, uncomplicated: Secondary | ICD-10-CM | POA: Diagnosis not present

## 2016-06-01 DIAGNOSIS — I428 Other cardiomyopathies: Secondary | ICD-10-CM | POA: Diagnosis not present

## 2016-06-01 DIAGNOSIS — Z6841 Body Mass Index (BMI) 40.0 and over, adult: Secondary | ICD-10-CM | POA: Diagnosis not present

## 2016-06-01 DIAGNOSIS — Z79899 Other long term (current) drug therapy: Secondary | ICD-10-CM | POA: Insufficient documentation

## 2016-06-01 DIAGNOSIS — I5020 Unspecified systolic (congestive) heart failure: Secondary | ICD-10-CM

## 2016-06-01 DIAGNOSIS — G4733 Obstructive sleep apnea (adult) (pediatric): Secondary | ICD-10-CM | POA: Insufficient documentation

## 2016-06-01 DIAGNOSIS — I110 Hypertensive heart disease with heart failure: Secondary | ICD-10-CM | POA: Diagnosis not present

## 2016-06-01 DIAGNOSIS — I5022 Chronic systolic (congestive) heart failure: Secondary | ICD-10-CM | POA: Insufficient documentation

## 2016-06-01 DIAGNOSIS — E669 Obesity, unspecified: Secondary | ICD-10-CM | POA: Insufficient documentation

## 2016-06-01 LAB — BASIC METABOLIC PANEL
ANION GAP: 12 (ref 5–15)
BUN: 11 mg/dL (ref 6–20)
CO2: 27 mmol/L (ref 22–32)
Calcium: 8.2 mg/dL — ABNORMAL LOW (ref 8.9–10.3)
Chloride: 102 mmol/L (ref 101–111)
Creatinine, Ser: 1.02 mg/dL — ABNORMAL HIGH (ref 0.44–1.00)
GFR calc Af Amer: >60 mL/min (ref >60)
GLUCOSE: 103 mg/dL — AB (ref 65–99)
POTASSIUM: 3.5 mmol/L (ref 3.5–5.1)
Sodium: 141 mmol/L (ref 135–145)

## 2016-06-01 MED FILL — TORSEMIDE 20 MG TABLET: 20 | 30 days supply | Qty: 120 | Fill #6

## 2016-06-01 MED FILL — KLOR-CON M20 TABLET: 20 | 30 days supply | Qty: 60 | Fill #3

## 2016-06-01 NOTE — Patient Instructions (Signed)
Please continue taking your medications how you have been.  You can take 1 extra torsemide 20mg  tablet in the afternoon if your weight goes up by 3 pounds in a day or 5 pounds in the next week.  We will see you for a follow-up appointment on 06/09/2016.

## 2016-06-01 NOTE — Progress Notes (Signed)
PCP: Dr. Heath Gold  OB:  Dr. Leo Grosser  EP: Dr. Lovena Le CHF: Anela Bensman  HPI:  [name] is a 40 year old AA woman with h/o obesity, chronic systolic heart failure due to nonischemic cardiomyopathy (cath 2007. No CAD) s/p Medtronic ICD implantation, asthma/chronic bronchitis and severe hypertension.     She presents today for pharmacist-led HF medication titration. At last HF clinic visit on 10/17, her Delene Loll was restarted at lower dose 24/26 mg BID (previously on 97/103 mg BID) and spironolactone 12.5 mg daily. She complains of dizziness and fatigue today. She has been taking all of her medications that were started at last visit. She has had several episodes of diarrhea this morning - at least 5, and has not really been able to eat much all day. She reported that the odor was really strong and was also associated with nausea. Remains nauseous now.   . Shortness of breath/dyspnea on exertion? Yes in the last couple of days has been noticing this more . Orthopnea/PND? no . Edema? yes . Lightheadedness/dizziness? Yes - when she stands up. States she does feel  Dizzy sometimes while walking but this is not often . Daily weights at home? yes . Blood pressure/heart rate monitoring at home? yes . Following low-sodium/fluid-restricted diet? yes  HF Medications: Carvedilol 25 mg PO BID Hydralazine 75 mg three times daily Imdur 60 mg daily Entresto 24/26 two times daily Spironolactone 12.5 mg daily Torsemide 60 mg qam/20 mg qpm  Has the patient been experiencing any side effects to the medications prescribed?  no  Does the patient have any problems obtaining medications due to transportation or finances?   no  Understanding of regimen: excellent Understanding of indications: excellent Potential of compliance: excellent Patient understands to avoid NSAIDs. Patient understands to avoid decongestants.    Pertinent Lab Values: . 06/01/16: Serum creatinine 1.02 (BL ~1), BUN 11, Potassium 3.5,  Sodium 141 . 05/04/16: BNP 59  Vital Signs: . Weight: 262 lb (dry weight: 258 lb) . Blood pressure: 130/100 mmHg  . Heart rate: 82 bpm    Assessment: 1. Chronicsystolic CHF (EF 0000000), due to NICM s/p ICD likely related to HTN. NYHA class IIsymptoms.  - Volume is up during this visit, weight is up at least 3 pounds from baseline. Has been experiencing significant diarrhea, dizziness, and fatigue since this am  - Continue current medications and may take an additional 20 mg torsemide in PM for continued swelling or weight gain >3 lb in 1 day or >5 lb in 1 week - Basic disease state pathophysiology, medication indication, mechanism and side effects reviewed at length with patient and he verbalized understanding 2) OSA  -  Has got CPAP but feels it is too much pressure. Will call [facility].  3) HTN - Adding back HF meds as above.  4) Asthma/chronic bronchitis - Stable. 5) Obesity: - In process of education for gastric sleeve. She is cleared from a cardiac perspective to proceed. Needs further insurance clearance.    Plan: 1) Medication changes: Based on clinical presentation, vital signs and recent labs will continue current medications (Coreg 25 mg BID, Hydralazine 75 mg TID, Imdur 60 mg daily, Entresto 24/26 BID, Spironolactone 12.5 mg daily, Torsemide 60 mg qam/20 mg qpm with additional 20 mg qpm for swelling/weight gain) since patient was experiencing fatigue, nausea, and dizziness.  2) Labs: BMET 06/01/16 3) Follow-up: November 14th, 2017 with Oda Kilts, PA-C   Ruta Hinds. [name], PharmD, BCPS, CPP Clinical Pharmacist Pager: [phone] Phone: [phone] 06/01/2016 3:22 PM  Chart reviewed. Agree with plan as above.  Glori Bickers MD

## 2016-06-09 ENCOUNTER — Telehealth (HOSPITAL_COMMUNITY): Payer: Self-pay | Admitting: Pharmacist

## 2016-06-09 ENCOUNTER — Other Ambulatory Visit (HOSPITAL_COMMUNITY): Payer: Self-pay | Admitting: Pharmacist

## 2016-06-09 ENCOUNTER — Ambulatory Visit (HOSPITAL_COMMUNITY)
Admission: RE | Admit: 2016-06-09 | Discharge: 2016-06-09 | Disposition: A | Discharge status: Home / Self Care | Payer: BLUE CROSS/BLUE SHIELD | Source: Ambulatory Visit | Attending: Internal Medicine | Admitting: Internal Medicine

## 2016-06-09 ENCOUNTER — Encounter (HOSPITAL_COMMUNITY): Payer: Self-pay

## 2016-06-09 VITALS — BP 128/84 | HR 72 | Wt 261.5 lb

## 2016-06-09 DIAGNOSIS — Z833 Family history of diabetes mellitus: Secondary | ICD-10-CM | POA: Insufficient documentation

## 2016-06-09 DIAGNOSIS — I10 Essential (primary) hypertension: Secondary | ICD-10-CM

## 2016-06-09 DIAGNOSIS — I428 Other cardiomyopathies: Secondary | ICD-10-CM | POA: Diagnosis not present

## 2016-06-09 DIAGNOSIS — Z8249 Family history of ischemic heart disease and other diseases of the circulatory system: Secondary | ICD-10-CM | POA: Insufficient documentation

## 2016-06-09 DIAGNOSIS — Z79899 Other long term (current) drug therapy: Secondary | ICD-10-CM | POA: Diagnosis not present

## 2016-06-09 DIAGNOSIS — G4733 Obstructive sleep apnea (adult) (pediatric): Secondary | ICD-10-CM | POA: Diagnosis not present

## 2016-06-09 DIAGNOSIS — E669 Obesity, unspecified: Secondary | ICD-10-CM | POA: Insufficient documentation

## 2016-06-09 DIAGNOSIS — I5020 Unspecified systolic (congestive) heart failure: Secondary | ICD-10-CM

## 2016-06-09 DIAGNOSIS — J42 Unspecified chronic bronchitis: Secondary | ICD-10-CM

## 2016-06-09 DIAGNOSIS — I5042 Chronic combined systolic (congestive) and diastolic (congestive) heart failure: Secondary | ICD-10-CM | POA: Diagnosis not present

## 2016-06-09 DIAGNOSIS — J45909 Unspecified asthma, uncomplicated: Secondary | ICD-10-CM | POA: Diagnosis not present

## 2016-06-09 DIAGNOSIS — Z6841 Body Mass Index (BMI) 40.0 and over, adult: Secondary | ICD-10-CM | POA: Insufficient documentation

## 2016-06-09 DIAGNOSIS — Z95810 Presence of automatic (implantable) cardiac defibrillator: Secondary | ICD-10-CM | POA: Insufficient documentation

## 2016-06-09 DIAGNOSIS — I110 Hypertensive heart disease with heart failure: Secondary | ICD-10-CM | POA: Diagnosis not present

## 2016-06-09 MED ORDER — SACUBITRIL-VALSARTAN 49-51 MG PO TABS: 1.0000 | ORAL_TABLET | Freq: Two times a day (BID) | ORAL | 11 refills | Status: DC

## 2016-06-09 NOTE — Telephone Encounter (Signed)
Entresto 49-51 mg BID PA approved by [place] through 07/26/38. Gave her the manufacturer copay card to assist with copays.  Ruta Hinds. [name], PharmD, BCPS, CPP Clinical Pharmacist Pager: [phone] Phone: [phone] 06/09/2016 3:32 PM

## 2016-06-09 NOTE — Patient Instructions (Signed)
Increase Entresto to 49/51mg  twice daily.  Lab work in 2 weeks (bmet)  Follow up with Dr.Bensimhon in 3 months

## 2016-06-09 NOTE — Progress Notes (Signed)
Patient ID: [name], female   DOB: [birth year], 40 y.o.   MRN: [id number]   ADVANCED HF CLINIC NOTE  PCP: Dr. Heath Gold  OB:  Dr. Leo Grosser  EP: Dr. Lovena Le CHF: Bensimhon  HPI: [name] is a 40 year old woman with h/o obesity, chronic systolic heart failure due to nonischemic cardiomyopathy (cath 2007. No CAD) s/p Medtronic ICD implantation, asthma/chronic bronchitis and severe hypertension.   ECHOs 04/27/2011 45-50%.   08/20/11 ECHO 55-60%   05/19/12 EF ~40%   07/01/12 EF ~40%   08/02/12 EF~40%    2/14 EF ~50%                          5/14 EF ~35%                          1/16 EF 25-30%                           3/17 EF 55-60% Grade I DD                          CPX 01/2014: Resting HR 75, Peak HR 121 Peak VO2 15.2, 74.4% predicted VE/VCO2 slope: 26.4 OUES: 1.93 Peak RER 1.15 VE/MVV 61.7%  Seen in ED 05/04/16 was having atypical chest pain and jaw pain. Thought to be mildly volume overloaded.   She presents today for regular follow up. At last visit started back on Entresto at starting dose because she had been out/off for a week. Saw pharmacist, but unable to titrate meds as she feeling ill.  Having occasional headaches but not limiting.  Dizziness much improved. No orthopnea. Still having some peripheral edema. Occasional tries compression stockings. They work, but her old ones have lost their strength. Has had 2nd psych visit, and is cleared to proceed with gastric sleeve surgery.  No surgery currently scheduled. Has to pay several bills before surgery can be scheduled. Still not exercising. 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    LABS: 01/03/14: K 4.2 Cr 1.27 pBNP 1766 (down from 4070)            02/13/14: K 4.4, creatinine 1.27            05/15/14: K 4.0, Creatinine 1.36  08/29/14: K 3.5, Creatinine 1.17  SH: Married. Lives in [place], 2 children. Working at [employer]. No ETOH or smoking  FH: Mother living: DM2, HTN, no CAD        Father  living: DM2, HTN  ROS: All systems negative except as listed in HPI, PMH and Problem List.  Past Medical History:  Diagnosis Date  . Acute bronchitis and bronchiolitis   . Anemia    Iron supplements in the past  . Asthma    Inhaler prn;triggered by seasonal changes mostly cold weathert;last asthma  attack years ago  . Atrial fibrillation ([place])   . CHF (congestive heart failure) ([place]) 01/2006   Has Pacemaker and Defibrillator;was on heart meds;no longer taking since pregancy  . CHF (congestive heart failure) ([place])   . Hypertension    currently on Labetalol 200mg  BID  . Hypertension   . Infection    UTI;not frequent;last infection was in June  . Infection    Yeast;not frequent  . Migraines    Can't take Procardia XL, causes migraines  . Other  primary cardiomyopathies   . Paroxysmal ventricular tachycardia ([place])   . Varicose veins 1998   Developed during last pregnancy    Current Outpatient Prescriptions  Medication Sig Dispense Refill  . albuterol (PROVENTIL HFA;VENTOLIN HFA) 108 (90 Base) MCG/ACT inhaler Inhale 2 puffs into the lungs every 6 (six) hours as needed for wheezing or shortness of breath. Rescue inhaler 1 Inhaler 1  . amLODipine (NORVASC) 2.5 MG tablet Take 1 tablet (2.5 mg total) by mouth daily. 30 tablet 6  . CALCIUM PO Take 1 tablet by mouth 3 (three) times daily.    . carvedilol (COREG) 25 MG tablet TAKE 1 TABLET BY MOUTH 2 TIMES DAILY WITH A MEAL. 60 tablet 6  . cetirizine (ZYRTEC) 10 MG tablet Take 10 mg by mouth daily as needed for allergies or rhinitis.     . ferrous sulfate 325 (65 FE) MG tablet Take 1 tablet (325 mg total) by mouth 3 (three) times daily with meals. 90 tablet 3  . hydrALAZINE (APRESOLINE) 50 MG tablet Take 1.5 tablets (75 mg total) by mouth 3 (three) times daily. 135 tablet 6  . isosorbide mononitrate (IMDUR) 60 MG 24 hr tablet Take 1 tablet (60 mg total) by mouth daily. 30 tablet 6  . potassium chloride SA (K-DUR,KLOR-CON) 20 MEQ tablet Take  1 tablet (20 mEq total) by mouth 2 (two) times daily. 60 tablet 3  . ranitidine (ZANTAC) 150 MG tablet Take 150 mg by mouth 2 (two) times daily.    Marland Kitchen Respiratory Therapy Supplies (FLUTTER) DEVI Use as directed 1 each 0  . sacubitril-valsartan (ENTRESTO) 24-26 MG Take 1 tablet by mouth 2 (two) times daily. 60 tablet 3  . spironolactone (ALDACTONE) 25 MG tablet Take 0.5 tablets (12.5 mg total) by mouth daily. 15 tablet 0  . torsemide (DEMADEX) 20 MG tablet TAKE 3 TABS BY MOUTH EVERY MORNING AND 1 TAB EVERY EVENING 120 tablet 6   No current facility-administered medications for this encounter.     Vitals:   06/09/16 1524  BP: 128/84  Pulse: 72  SpO2: 98%  Weight: 261 lb 8 oz (118.6 kg)   Wt Readings from Last 3 Encounters:  06/09/16 261 lb 8 oz (118.6 kg)  06/01/16 262 lb 3.2 oz (118.9 kg)  05/12/16 258 lb 12.8 oz (117.4 kg)     PHYSICAL EXAM: General: Well appearing; No resp difficulty.  HEENT: normal Neck: supple. JVP does not appear elevated. 2+ bilaterally; no bruits. No thyromegaly or nodule noted.  Cor: PMI normal. RRR. +s4. No murmurs appreciated Lungs: Clear, normal effort.  Abdomen: obese soft, NT, ND, no HSM. No bruits or masses. +BS  Extremities: no cyanosis, clubbing, rash, Trace - 1+ ankle edema. Neuro: alert & orientedx3, cranial nerves grossly intact. Moves all 4 extremities w/o difficulty. Affect pleasant.  ASSESSMENT & PLAN:  1) Chronic combined systolic/diastolic heart failure with recovered EF, NICM s/p ICD likely r/t HTN, last echo 1/16 EF 25-30%. Cath 8/16 EF 35% No CAD. Echo 3/17 EF 55-60%, Grade 1 DD - NYHA II.  - Volume status stable. Does have some peripheral edema. Recommended compression stockings and given prescription.  - Continue torsemide 60 mg q am and 20 mg q pm.  Can take an extra 20 mg in evening as needed.         - Continue hydralazine 75 mg TID, Imdur 60 mg daily - Continue spiro 12.5 mg daily.  - Increase Entresto to 49/51 mg BID.  -  Continue carvedilol 25 bid 2)  OSA  -  Working on having pressure of her CPAP adjusted. 3) HTN - Treating in setting of adjusting her HF medications.   4) Asthma/chronic bronchitis - Stable. 5) Obesity: - In process of education for gastric sleeve. She is cleared from a cardiac perspective to proceed.  - Has completed psych eval.  Has negative balance of ~ $600, but then can move forward with scheduling per patient.   Meds as above. BMET 2 weeks. Follow up 3 months with MD. Pt knows to call sooner with any symptoms.   [name], PA-C 3:26 PM

## 2016-06-15 ENCOUNTER — Telehealth: Payer: Self-pay | Admitting: Genetic Counselor

## 2016-06-15 NOTE — Telephone Encounter (Signed)
Discussed with [name] that her genetic test results were negative for mutations within any of 32 genes on the Comprehensive Cancer Panel (with MSH2 Exons 1-7 Inversion Analysis) through [facility].  Additionally, no variants of uncertain significance ([place]US[place]) were found.  Discussed that this is most likely a reassuring result.  She should continue to follow her doctors' recommendations for future cancer screening.  She has not yet gotten a mammogram yet, so annual mammograms were advised.  Recommended genetic testing for affected family members (paternal aunt with ovarian cancer; paternal 1st cousin w/ kidney cancer in his 23s; maternal 1st cousin with breast cancer in her late 35s).  [name] is welcome to call or email with any questions she may have.  I will email her a copy of her results.

## 2016-06-16 ENCOUNTER — Ambulatory Visit: Payer: Self-pay | Admitting: Genetic Counselor

## 2016-06-16 DIAGNOSIS — Z809 Family history of malignant neoplasm, unspecified: Secondary | ICD-10-CM

## 2016-06-16 DIAGNOSIS — Z8041 Family history of malignant neoplasm of ovary: Secondary | ICD-10-CM

## 2016-06-16 DIAGNOSIS — Z1379 Encounter for other screening for genetic and chromosomal anomalies: Secondary | ICD-10-CM

## 2016-06-16 DIAGNOSIS — Z803 Family history of malignant neoplasm of breast: Secondary | ICD-10-CM

## 2016-06-17 ENCOUNTER — Other Ambulatory Visit (HOSPITAL_COMMUNITY): Payer: Self-pay | Admitting: Internal Medicine

## 2016-06-17 MED FILL — ENTRESTO 49 MG-51 MG TABLET: 49-51 | 30 days supply | Qty: 60 | Fill #0

## 2016-06-17 MED FILL — CARVEDILOL 25 MG TABLET: 25 | 30 days supply | Qty: 60 | Fill #5

## 2016-06-17 MED FILL — ISOSORBIDE MN ER 60 MG TAB: 60 | 30 days supply | Qty: 30 | Fill #0

## 2016-06-17 MED FILL — AMLODIPINE BESYLATE 2.5 MG: 2.5 | 30 days supply | Qty: 30 | Fill #0

## 2016-06-17 MED FILL — SPIRONOLACTONE 25 MG TABLET: 25 | 30 days supply | Qty: 15 | Fill #0

## 2016-06-17 MED FILL — hydrALAZINE HCL 50 MG TABS: 50 | 30 days supply | Qty: 135 | Fill #3

## 2016-06-24 ENCOUNTER — Ambulatory Visit (HOSPITAL_COMMUNITY)
Admission: RE | Admit: 2016-06-24 | Discharge: 2016-06-24 | Disposition: A | Discharge status: Home / Self Care | Payer: BLUE CROSS/BLUE SHIELD | Source: Ambulatory Visit | Attending: Internal Medicine | Admitting: Internal Medicine

## 2016-06-24 DIAGNOSIS — I5020 Unspecified systolic (congestive) heart failure: Secondary | ICD-10-CM | POA: Diagnosis not present

## 2016-06-24 LAB — BASIC METABOLIC PANEL
ANION GAP: 8 (ref 5–15)
BUN: 11 mg/dL (ref 6–20)
CALCIUM: 8.5 mg/dL — AB (ref 8.9–10.3)
CO2: 31 mmol/L (ref 22–32)
Chloride: 105 mmol/L (ref 101–111)
Creatinine, Ser: 1.01 mg/dL — ABNORMAL HIGH (ref 0.44–1.00)
GFR calc Af Amer: >60 mL/min (ref >60)
Glucose, Bld: 133 mg/dL — ABNORMAL HIGH (ref 65–99)
POTASSIUM: 3.7 mmol/L (ref 3.5–5.1)
SODIUM: 144 mmol/L (ref 135–145)

## 2016-07-12 DIAGNOSIS — Z1379 Encounter for other screening for genetic and chromosomal anomalies: Secondary | ICD-10-CM | POA: Insufficient documentation

## 2016-07-12 NOTE — Progress Notes (Signed)
GENETIC TEST RESULT  HPI: [name] was previously seen in the [facility] due to a family history of ovarian, breast, and other cancers and concerns regarding a hereditary predisposition to cancer. Please refer to our prior cancer genetics clinic note from May 26, 2016 for more information regarding [name]'s medical, social and family histories, and our assessment and recommendations, at the time. [name] recent genetic test results were disclosed to her, as were recommendations warranted by these results. These results and recommendations are discussed in more detail below.  GENETIC TEST RESULTS: Genetic testing reported out on June 11, 2016 through the 32-gene Comprehensive Cancer Panel (with MSH2 Exons 1-7 Inversion Analysis) found no deleterious mutations.  Additionally, no variants of uncertain significance (VUSes) were found.  The Comprehensive Cancer Panel offered by [facility] Junius Roads, MD) includes sequencing and/or deletion duplication testing of the following 32 genes: APC, ATM, AXIN2, BARD1, BMPR1A, BRCA1, BRCA2, BRIP1, CDH1, CDK4, CDKN2A, CHEK2, EPCAM, FANCC, MLH1, MSH2, MSH6, MUTYH, NBN, PALB2, PMS2, POLD1, POLE, PTEN, RAD51C, RAD51D, SCG5/GREM1, SMAD4, STK11, TP53, VHL, and XRCC2.  The test report will be scanned into EPIC and will be located under the Molecular Pathology section of the Results Review tab.   We discussed with [name] that since the current genetic testing is not perfect, it is possible there may be a gene mutation in one of these genes that current testing cannot detect, but that chance is small. We also discussed, that it is possible that another gene that has not yet been discovered, or that we have not yet tested, is responsible for the cancer diagnoses in the family, and it is, therefore, important to remain in touch with cancer genetics in the future so that we can continue to offer [name] the most up-to-date  genetic testing.   CANCER SCREENING RECOMMENDATIONS: This normal result is likely reassuring and indicates that [name] does not likely have an increased risk of cancer due to a mutation in one of these genes.  We, therefore, recommended  [name] continue to follow the cancer screening guidelines provided by her primary healthcare providers.   RECOMMENDATIONS FOR FAMILY MEMBERS: Men and women in this family might be at some increased risk of developing cancer, over the general population risk, simply due to the family history of cancer. We recommended women in this family have a yearly mammogram beginning at age 23, or 76 years younger than the earliest onset of cancer, an annual clinical breast exam, and perform monthly breast self-exams. Women in this family should also have a gynecological exam as recommended by their primary provider. All family members should have a colonoscopy by age 54.  Based on [name]'s family history, we recommended her paternal aunt, who was diagnosed with breast cancer at age 34, paternal first cousin diagnosed with kidney cancer in his 58s, and her maternal first cousin was diagnosed with breast cancer in her late 66s, have genetic counseling and testing. [name] will let us know if we can be of any assistance in coordinating genetic counseling and/or testing for these family members.   FOLLOW-UP: Lastly, we discussed with [name] that cancer genetics is a rapidly advancing field and it is possible that new genetic tests will be appropriate for her and/or her family members in the future. We encouraged her to remain in contact with cancer genetics on an annual basis so we can update her personal and family histories and let her know of advances  in cancer genetics that may benefit this family.   Our contact number was provided. [name] questions were answered to her satisfaction, and she knows she is welcome to call us at anytime with additional questions  or concerns.   Jeanine Luz, MS, [facility] Certified Genetic Counselor [email or web address] Phone: [phone]

## 2016-07-16 ENCOUNTER — Other Ambulatory Visit (HOSPITAL_COMMUNITY): Payer: Self-pay | Admitting: Student

## 2016-07-16 MED FILL — KLOR-CON M20 TABLET: 20 | 30 days supply | Qty: 60 | Fill #0

## 2016-07-16 MED FILL — TORSEMIDE 20 MG TABLET: 20 | 30 days supply | Qty: 120 | Fill #0

## 2016-07-16 MED FILL — AMLODIPINE BESYLATE 2.5 MG: 2.5 | 30 days supply | Qty: 30 | Fill #1

## 2016-07-16 MED FILL — CARVEDILOL 25 MG TABLET: 25 | 30 days supply | Qty: 60 | Fill #6

## 2016-07-16 MED FILL — ISOSORBIDE MN ER 60 MG TAB: 60 | 30 days supply | Qty: 30 | Fill #1

## 2016-07-16 MED FILL — ENTRESTO 49 MG-51 MG TABLET: 49-51 | 30 days supply | Qty: 60 | Fill #1

## 2016-07-16 MED FILL — SPIRONOLACTONE 25 MG TABLET: 25 | 30 days supply | Qty: 15 | Fill #0

## 2016-07-23 ENCOUNTER — Encounter: Payer: Self-pay | Admitting: Skilled Nursing Facility1

## 2016-07-23 ENCOUNTER — Encounter: Payer: BLUE CROSS/BLUE SHIELD | Attending: General Surgery | Admitting: Skilled Nursing Facility1

## 2016-07-23 DIAGNOSIS — Z6841 Body Mass Index (BMI) 40.0 and over, adult: Secondary | ICD-10-CM | POA: Diagnosis not present

## 2016-07-23 DIAGNOSIS — E6609 Other obesity due to excess calories: Secondary | ICD-10-CM

## 2016-07-23 DIAGNOSIS — Z713 Dietary counseling and surveillance: Secondary | ICD-10-CM | POA: Insufficient documentation

## 2016-07-23 NOTE — Progress Notes (Signed)
  Pre-Op Assessment Visit:  Pre-Operative Sleeve Gastrectomy Surgery  Medical Nutrition Therapy:  Appt start time: 0820   End time:  0855.  Patient was seen on 02/24/2016 for Pre-Operative Nutrition Assessment. Assessment and letter of approval faxed to [facility] Surgery Bariatric Surgery Program coordinator on 02/24/2016.   Pt states she needs to more SWL appointments. Pt states she has been working on chewing her food and not drinking with meals. Pt states she will be getting back into the gym once she gest a plan together.   Preferred Learning Style:   No preference indicated   Learning Readiness:   Ready  Handouts given during visit include:  Pre-Op Goals Bariatric Surgery Protein Shakes   During the appointment today the following Pre-Op Goals were reviewed with the patient: Maintain or lose weight as instructed by your surgeon Make healthy food choices Begin to limit portion sizes Limited concentrated sugars and fried foods Keep fat/sugar in the single digits per serving on food labels Practice CHEWING your food  (aim for 30 chews per bite or until applesauce consistency) Practice not drinking 15 minutes before, during, and 30 minutes after each meal/snack Avoid all carbonated beverages  Avoid/limit caffeinated beverages  Avoid all sugar-sweetened beverages Consume 3 meals per day; eat every 3-5 hours Make a list of non-food related activities Aim for 64-100 ounces of FLUID daily  Aim for at least 60-80 grams of PROTEIN daily Look for a liquid protein source that contain ?15 g protein and ?5 g carbohydrate  (ex: shakes, drinks, shots)  Patient-Centered Goals: Improve health, especially blood pressure and Hgb A1c (prediabetes), and remain healthy for her young daughter.   Demonstrated degree of understanding via:  Teach Back  Teaching Method Utilized:  Visual Auditory Hands on  Barriers to learning/adherence to lifestyle change: none  Patient to call the  [facility] to enroll in Pre-Op and Post-Op Nutrition Education when surgery date is scheduled.

## 2016-08-21 ENCOUNTER — Other Ambulatory Visit (HOSPITAL_COMMUNITY): Payer: Self-pay | Admitting: Internal Medicine

## 2016-08-21 MED FILL — hydrALAZINE HCL 50 MG TABS: 50 | 30 days supply | Qty: 135 | Fill #4

## 2016-08-21 MED FILL — CARVEDILOL 25 MG TABLET: 25 | 30 days supply | Qty: 60 | Fill #0

## 2016-08-21 MED FILL — AMLODIPINE BESYLATE 2.5 MG: 2.5 | 30 days supply | Qty: 30 | Fill #2

## 2016-08-21 MED FILL — SPIRONOLACTONE 25 MG TABLET: 25 | 30 days supply | Qty: 15 | Fill #1

## 2016-08-21 MED FILL — KLOR-CON M20 TABLET: 20 | 30 days supply | Qty: 60 | Fill #1

## 2016-08-21 MED FILL — TORSEMIDE 20 MG TABLET: 20 | 30 days supply | Qty: 120 | Fill #1

## 2016-08-21 MED FILL — ENTRESTO 49 MG-51 MG TABLET: 49-51 | 30 days supply | Qty: 60 | Fill #2

## 2016-08-21 MED FILL — ISOSORBIDE MN ER 60 MG TAB: 60 | 30 days supply | Qty: 30 | Fill #2

## 2016-08-26 ENCOUNTER — Encounter: Payer: Self-pay | Admitting: Skilled Nursing Facility1

## 2016-08-26 ENCOUNTER — Encounter: Payer: BLUE CROSS/BLUE SHIELD | Attending: General Surgery | Admitting: Skilled Nursing Facility1

## 2016-08-26 DIAGNOSIS — Z713 Dietary counseling and surveillance: Secondary | ICD-10-CM | POA: Insufficient documentation

## 2016-08-26 DIAGNOSIS — Z6841 Body Mass Index (BMI) 40.0 and over, adult: Secondary | ICD-10-CM | POA: Insufficient documentation

## 2016-08-26 DIAGNOSIS — E669 Obesity, unspecified: Secondary | ICD-10-CM

## 2016-08-26 NOTE — Progress Notes (Signed)
  Pre-Op Assessment Visit:  Pre-Operative Sleeve Gastrectomy Surgery  Medical Nutrition Therapy:  Appt start time: 0820   End time:  0855.  Patient was seen on 02/24/2016 for Pre-Operative Nutrition Assessment. Assessment and letter of approval faxed to [facility] Surgery Bariatric Surgery Program coordinator on 02/24/2016.    Pt returns having lost 6 pounds. Pt states she is eliminating carbs and starches, pt states she has not had an appetite lately. Pt states she does not eat beef or pork. Pt states she has Fluid limit to 64 fluid ounces due to CHF  Wt: 263 BMI: 47.77  First meal: yogurt Snack: apple Lunch: greek salad-----protein shake Dinner: salad and chicken Beverages:  Water, hot tea with honey   Preferred Learning Style:   No preference indicated   Learning Readiness:   Ready  Handouts given during visit include:  Pre-Op Goals Bariatric Surgery Protein Shakes   During the appointment today the following Pre-Op Goals were reviewed with the patient: Maintain or lose weight as instructed by your surgeon Make healthy food choices Begin to limit portion sizes Limited concentrated sugars and fried foods Keep fat/sugar in the single digits per serving on food labels Practice CHEWING your food  (aim for 30 chews per bite or until applesauce consistency) Practice not drinking 15 minutes before, during, and 30 minutes after each meal/snack Avoid all carbonated beverages  Avoid/limit caffeinated beverages  Avoid all sugar-sweetened beverages Consume 3 meals per day; eat every 3-5 hours Make a list of non-food related activities Aim for 64-100 ounces of FLUID daily  Aim for at least 60-80 grams of PROTEIN daily Look for a liquid protein source that contain ?15 g protein and ?5 g carbohydrate  (ex: shakes, drinks, shots)  Patient-Centered Goals: Improve health, especially blood pressure and Hgb A1c (prediabetes), and remain healthy for her young daughter.    Demonstrated degree of understanding via:  Teach Back  Teaching Method Utilized:  Visual Auditory Hands on  Barriers to learning/adherence to lifestyle change: none  Patient to call the [facility] to enroll in Pre-Op and Post-Op Nutrition Education when surgery date is scheduled.

## 2016-08-26 NOTE — Patient Instructions (Signed)
-  Continue working on not drinking with meals -Continue working on chewing very well

## 2016-09-04 ENCOUNTER — Emergency Department (HOSPITAL_COMMUNITY): Payer: BLUE CROSS/BLUE SHIELD

## 2016-09-04 ENCOUNTER — Encounter (HOSPITAL_COMMUNITY): Payer: Self-pay | Admitting: *Deleted

## 2016-09-04 ENCOUNTER — Emergency Department (HOSPITAL_COMMUNITY)
Admission: EM | Admit: 2016-09-04 | Discharge: 2016-09-05 | Disposition: A | Discharge status: Home / Self Care | Payer: BLUE CROSS/BLUE SHIELD | Attending: Emergency Medicine | Admitting: Emergency Medicine

## 2016-09-04 DIAGNOSIS — J181 Lobar pneumonia, unspecified organism: Secondary | ICD-10-CM | POA: Insufficient documentation

## 2016-09-04 DIAGNOSIS — I5022 Chronic systolic (congestive) heart failure: Secondary | ICD-10-CM | POA: Insufficient documentation

## 2016-09-04 DIAGNOSIS — R600 Localized edema: Secondary | ICD-10-CM | POA: Diagnosis not present

## 2016-09-04 DIAGNOSIS — J45909 Unspecified asthma, uncomplicated: Secondary | ICD-10-CM | POA: Insufficient documentation

## 2016-09-04 DIAGNOSIS — J189 Pneumonia, unspecified organism: Secondary | ICD-10-CM

## 2016-09-04 DIAGNOSIS — Z95810 Presence of automatic (implantable) cardiac defibrillator: Secondary | ICD-10-CM | POA: Insufficient documentation

## 2016-09-04 DIAGNOSIS — I110 Hypertensive heart disease with heart failure: Secondary | ICD-10-CM | POA: Diagnosis not present

## 2016-09-04 DIAGNOSIS — R071 Chest pain on breathing: Secondary | ICD-10-CM | POA: Diagnosis present

## 2016-09-04 HISTORY — DX: Pneumonia, unspecified organism: J18.9

## 2016-09-04 LAB — BASIC METABOLIC PANEL
Anion gap: 11 (ref 5–15)
BUN: 12 mg/dL (ref 6–20)
CHLORIDE: 96 mmol/L — AB (ref 101–111)
CO2: 30 mmol/L (ref 22–32)
CREATININE: 1.28 mg/dL — AB (ref 0.44–1.00)
Calcium: 8.5 mg/dL — ABNORMAL LOW (ref 8.9–10.3)
GFR calc non Af Amer: 51 mL/min — ABNORMAL LOW (ref >60)
GFR, EST AFRICAN AMERICAN: 59 mL/min — AB (ref >60)
Glucose, Bld: 113 mg/dL — ABNORMAL HIGH (ref 65–99)
Potassium: 3.7 mmol/L (ref 3.5–5.1)
Sodium: 137 mmol/L (ref 135–145)

## 2016-09-04 LAB — I-STAT TROPONIN, ED: Troponin i, poc: 0 ng/mL (ref 0.00–0.08)

## 2016-09-04 LAB — URINALYSIS, ROUTINE W REFLEX MICROSCOPIC
Bilirubin Urine: NEGATIVE
Glucose, UA: NEGATIVE mg/dL
Hgb urine dipstick: NEGATIVE
Ketones, ur: NEGATIVE mg/dL
Leukocytes, UA: NEGATIVE
Nitrite: NEGATIVE
Protein, ur: NEGATIVE mg/dL
Specific Gravity, Urine: 1.009 (ref 1.005–1.030)
pH: 5 (ref 5.0–8.0)

## 2016-09-04 LAB — CBC
HCT: 34.5 % — ABNORMAL LOW (ref 36.0–46.0)
HEMOGLOBIN: 10.9 g/dL — AB (ref 12.0–15.0)
MCH: 26.1 pg (ref 26.0–34.0)
MCHC: 31.6 g/dL (ref 30.0–36.0)
MCV: 82.7 fL (ref 78.0–100.0)
PLATELETS: 267 10*3/uL (ref 150–400)
RBC: 4.17 MIL/uL (ref 3.87–5.11)
RDW: 16.7 % — ABNORMAL HIGH (ref 11.5–15.5)
WBC: 11.4 10*3/uL — ABNORMAL HIGH (ref 4.0–10.5)

## 2016-09-04 LAB — TROPONIN I: Troponin I: <0.03 ng/mL (ref <0.03)

## 2016-09-04 MED ORDER — LEVOFLOXACIN IN D5W 750 MG/150ML IV SOLN
750.0000 mg | Freq: Once | INTRAVENOUS | Status: AC
  Administered 2016-09-04: 750 mg via INTRAVENOUS
  Filled 2016-09-04: qty 150

## 2016-09-04 MED ORDER — MORPHINE SULFATE (PF) 4 MG/ML IV SOLN
4.0000 mg | Freq: Once | INTRAVENOUS | Status: AC
  Administered 2016-09-04: 4 mg via INTRAVENOUS
  Filled 2016-09-04: qty 1

## 2016-09-04 MED ORDER — IOPAMIDOL (ISOVUE-M 300) INJECTION 61%: 15.0000 mL | Freq: Once | INTRAMUSCULAR | Status: DC | PRN

## 2016-09-04 MED ORDER — IOPAMIDOL (ISOVUE-370) INJECTION 76%
100.0000 mL | Freq: Once | INTRAVENOUS | Status: AC | PRN
  Administered 2016-09-04: 100 mL via INTRAVENOUS

## 2016-09-04 MED ORDER — DIAZEPAM 5 MG/ML IJ SOLN
5.0000 mg | Freq: Once | INTRAMUSCULAR | Status: AC
  Administered 2016-09-04: 5 mg via INTRAVENOUS
  Filled 2016-09-04: qty 2

## 2016-09-04 NOTE — ED Notes (Signed)
Pt ambulated in hallway with family's assistance to restroom.

## 2016-09-04 NOTE — ED Triage Notes (Signed)
PT C/O RT SIDED CHEST PAIN SINCE YESTERDAY WITH SOME SOB  SHE JUST RECENTLY HAD THE FLU.  SHE HAS TAKEN DIURECTICS TODAY AND HAS ONLY VOIDED ONCE  [facility] JAN [facility]TH

## 2016-09-04 NOTE — ED Provider Notes (Signed)
[facility] DEPT Provider Note   CSN: [id number] Arrival date & time: 09/04/16  1707     History   Chief Complaint Chief Complaint  Patient presents with  . Chest Pain    HPI [name] is a 41 y.o. female.  The history is provided by the patient.  Chest Pain   This is a new problem. The current episode started yesterday. The problem occurs constantly. The problem has been gradually worsening. The pain is associated with exertion, movement, coughing, walking, breathing and rest. The pain is present in the lateral region. The pain is severe. The quality of the pain is described as sharp and burning. The pain radiates to the upper back. Duration of episode(s) is 2 days. The symptoms are aggravated by certain positions, deep breathing and exertion. Associated symptoms include back pain, lower extremity edema and shortness of breath. Pertinent negatives include no abdominal pain, no cough, no diaphoresis, no fever, no headaches, no hemoptysis, no nausea, no palpitations and no vomiting. She has tried nothing for the symptoms. Risk factors include obesity and lack of exercise.  Her past medical history is significant for CHF, hypertension and pacemaker.  Pertinent negatives for past medical history include no seizures.  Procedure history is positive for echocardiogram.    Past Medical History:  Diagnosis Date  . Acute bronchitis and bronchiolitis   . Anemia    Iron supplements in the past  . Asthma    Inhaler prn;triggered by seasonal changes mostly cold weathert;last asthma  attack years ago  . Atrial fibrillation ([place])   . CHF (congestive heart failure) ([place]) 01/2006   Has Pacemaker and Defibrillator;was on heart meds;no longer taking since pregancy  . CHF (congestive heart failure) ([place])   . Hypertension    currently on Labetalol 200mg  BID  . Hypertension   . Infection    UTI;not frequent;last infection was in June  . Infection    Yeast;not frequent  . Migraines    Can't  take Procardia XL, causes migraines  . Other primary cardiomyopathies   . Paroxysmal ventricular tachycardia ([place])   . Varicose veins 1998   Developed during last pregnancy    Patient Active Problem List   Diagnosis Date Noted  . Genetic testing 07/12/2016  . Family history of ovarian cancer 05/27/2016  . Family history of breast cancer in female 05/27/2016  . Pre-operative cardiovascular examination 12/11/2015  . Cough variant asthma vs UACS 11/19/2015  . Chronic diastolic heart failure ([place]) 10/29/2015  . Dyspnea 10/07/2015  . Chest pain 05/04/2015  . Bradycardia 05/04/2015  . Severe obesity (BMI >= 40) ([place]) 02/27/2014  . OSA (obstructive sleep apnea) 01/25/2014  . Chronic bronchitis ([place]) 01/25/2014  . CHF (congestive heart failure) ([place]) 12/08/2013  . Uncontrolled hypertension 12/08/2013  . Hypertensive crisis 12/12/2012  . Supervision of high-risk pregnancy 06/16/2012  . Low lying placenta with hemorrhage, antepartum 06/16/2012  . Previous cesarean delivery, delivered, with or without mention of antepartum condition 06/16/2012  . undesired fertility 06/16/2012  . History of pre-eclampsia in prior pregnancy, currently pregnant 06/02/2012  . Marginal placenta previa 05/24/2012  . Vaginal bleeding in pregnancy 05/24/2012  . Bilateral ovarian cysts 05/24/2012  . Anemia 05/05/2012  . BV (bacterial vaginosis) 04/04/2012  . Pacemaker 04/04/2012  . Cardiac defibrillator in situ 04/04/2012  . Hypertension complicating pregnancy [id number]  . Cardiomyopathy, hypertensive ([place]) 02/03/2012  . Dizziness 12/17/2011  . Chronic systolic heart failure ([place]) 04/13/2011  . Hypertension 03/05/2011  . VENTRICULAR TACHYCARDIA, PAROXYSMAL 10/02/2010  .  CARDIOMYOPATHY 08/27/2009  . Atrial fibrillation ([place]) 08/27/2009  . CHF 08/27/2009    Past Surgical History:  Procedure Laterality Date  . BREAST REDUCTION SURGERY  03/05/1994   d/t back, shoulder, and neck pain (42 F)  . CARDIAC  CATHETERIZATION     EF 30-35%  . CARDIAC CATHETERIZATION N/A 05/04/2015   Procedure: Left Heart Cath and Coronary Angiography;  Surgeon: Belva Crome, MD;  Location: [facility];  Service: Cardiovascular;  Laterality: N/A;  . CARDIAC DEFIBRILLATOR PLACEMENT    . CESAREAN SECTION  1998   d/t preeclampsia  . IMPLANTABLE CARDIOVERTER DEFIBRILLATOR (ICD) GENERATOR CHANGE N/A 09/10/2014   Procedure: ICD GENERATOR CHANGE;  Surgeon: Evans Lance, MD;  Location: [facility] CATH LAB;  Service: Cardiovascular;  Laterality: N/A;  . INSERT / REPLACE / REMOVE PACEMAKER   02/10/2006   Status post implantable cardioverter-defibrillator insertion  .Marland KitchenMarland KitchenMarland KitchenThe Medtronic Maximo VR, model [id number], single chamber    OB History    Gravida Para Term Preterm AB Living   2 1   1   1    SAB TAB Ectopic Multiple Live Births           1       Home Medications    Prior to Admission medications   Medication Sig Start Date End Date Taking? Authorizing Provider  acetaminophen (TYLENOL) 500 MG tablet Take 1,000 mg by mouth every 6 (six) hours as needed for headache (pain).   Yes Historical Provider, MD  albuterol (PROVENTIL HFA;VENTOLIN HFA) 108 (90 Base) MCG/ACT inhaler Inhale 2 puffs into the lungs every 6 (six) hours as needed for wheezing or shortness of breath. Rescue inhaler 09/12/15  Yes Shaune Pascal Bensimhon, MD  amLODipine (NORVASC) 2.5 MG tablet TAKE 1 TABLET BY MOUTH DAILY. 06/17/16  Yes Shaune Pascal Bensimhon, MD  CALCIUM PO Take 1 tablet by mouth 3 (three) times daily.   Yes Historical Provider, MD  carvedilol (COREG) 25 MG tablet TAKE 1 TABLET BY MOUTH TWICE DAILY WITH MEALS 08/21/16  Yes Jolaine Artist, MD  cetirizine (ZYRTEC) 10 MG tablet Take 10 mg by mouth daily as needed for allergies or rhinitis.    Yes Historical Provider, MD  ferrous sulfate 325 (65 FE) MG tablet Take 1 tablet (325 mg total) by mouth 3 (three) times daily with meals. 10/08/15  Yes Tanda Rockers, MD  hydrALAZINE (APRESOLINE) 50 MG tablet  Take 1.5 tablets (75 mg total) by mouth 3 (three) times daily. 09/12/15  Yes Jolaine Artist, MD  ibuprofen (ADVIL,MOTRIN) 200 MG tablet Take 400 mg by mouth every 6 (six) hours as needed for headache (pain).   Yes Historical Provider, MD  isosorbide mononitrate (IMDUR) 60 MG 24 hr tablet TAKE 1 TABLET BY MOUTH DAILY. 06/17/16  Yes Shaune Pascal Bensimhon, MD  potassium chloride SA (K-DUR,KLOR-CON) 20 MEQ tablet Take 1 tablet (20 mEq total) by mouth 2 (two) times daily. 10/29/15  Yes [name], PA-C  PRESCRIPTION MEDICATION Inhale into the lungs at bedtime. CPAP   Yes Historical Provider, MD  ranitidine (ZANTAC) 150 MG tablet Take 150 mg by mouth 2 (two) times daily.   Yes Historical Provider, MD  sacubitril-valsartan (ENTRESTO) 49-51 MG Take 1 tablet by mouth 2 (two) times daily. 06/09/16  Yes [name], PA-C  spironolactone (ALDACTONE) 25 MG tablet TAKE 1/2 TABLET BY MOUTH DAILY. 07/16/16  Yes [name], PA-C  torsemide (DEMADEX) 20 MG tablet TAKE 3 TABS BY MOUTH EVERY MORNING AND 1 TAB EVERY  EVENING Patient taking differently: Take 20-60 mg by mouth See admin instructions. Take 3 tablets (60 mg) by mouth every morning and 1 tablet (20 mg) at night 10/29/15  Yes [name], PA-C  HYDROcodone-acetaminophen (NORCO/VICODIN) 5-325 MG tablet Take 1 tablet by mouth every 6 (six) hours as needed for moderate pain or severe pain. 09/04/16 09/07/16  Clifton James, MD  levofloxacin (LEVAQUIN) 750 MG tablet Take 1 tablet (750 mg total) by mouth daily. 09/04/16 09/09/16  Clifton James, MD  Respiratory Therapy Supplies (FLUTTER) DEVI Use as directed 11/18/15   Tanda Rockers, MD    Family History Family History  Problem Relation Age of Onset  . Sickle cell trait Daughter   . Other Father     Varicose veins  . Hypertension Father   . Diabetes Father     Controlled w/ diet and exercise  . Colon polyps Father     approx 3-4  . Asthma Daughter   . Seizures  Maternal Uncle   . Migraines Mother   . Ulcers Mother   . Rheum arthritis Mother   . Colon polyps Mother     approx 2 polyps  . Migraines Daughter   . Migraines Cousin     Maternal  . Uterine cancer Paternal Grandmother     d. 60-61  . Prostate cancer Maternal Uncle     dx 69-70; surgery and rad  . Lung cancer Maternal Uncle 65    d. 69-70; smoker  . Lupus Maternal Aunt   . Hypertension Paternal Aunt   . Other Daughter     blood transfusion @ birth;platelets were low  . Diabetes Maternal Aunt   . Lung cancer Maternal Aunt     dx 71-72; +chemo  . Diabetes Paternal Aunt     x 2  . Diabetes Paternal Uncle   . Hypertension    . Heart disease    . Cancer Cousin     maternal 1st cousin d. early 72s; NOS cancer  . Breast cancer Cousin     maternal 1st cousin dx 36-48  . Breast cancer Other     maternal great aunt (MGM's sister) d. breast cancer in her late 44s  . Kidney cancer Cousin     paternal 1st cousin dx 14s; nephrectomy; not a smoker  . Ovarian cancer Paternal Aunt 18    paternal aunt (father's maternal half-sister)  . Breast cancer Paternal Aunt   . Ovarian cancer Paternal Aunt   . Uterine cancer Paternal Aunt   . Cancer Other     paternal great aunt (PGM's sister) dx NOS cancer  . Cancer Other     paternal great uncle (PGM's brother) dx NOS cancer  . Colon cancer Neg Hx     Social History Social History  Substance Use Topics  . Smoking status: Never Smoker  . Smokeless tobacco: Never Used  . Alcohol use No     Allergies   Percocet [oxycodone-acetaminophen]   Review of Systems Review of Systems  Constitutional: Negative for chills, diaphoresis and fever.  HENT: Negative for ear pain and sore throat.   Eyes: Negative for pain and visual disturbance.  Respiratory: Positive for shortness of breath. Negative for cough and hemoptysis.   Cardiovascular: Positive for chest pain. Negative for palpitations.  Gastrointestinal: Negative for abdominal pain,  nausea and vomiting.  Genitourinary: Negative for dysuria and hematuria.  Musculoskeletal: Positive for back pain. Negative for arthralgias.  Skin: Negative for color change and rash.  Neurological: Negative for seizures, syncope and headaches.  All other systems reviewed and are negative.    Physical Exam Updated Vital Signs BP 126/76   Pulse 93   Temp 99.6 F (37.6 C) (Oral)   Resp 25   Ht 5\' 4"  (1.626 m)   Wt 112.9 kg   LMP 08/22/2016   SpO2 94%   BMI 42.74 kg/m   Physical Exam  Constitutional: She appears well-developed and well-nourished. No distress.  HENT:  Head: Normocephalic and atraumatic.  Eyes: Conjunctivae are normal.  Neck: Neck supple.  Cardiovascular: Normal rate and regular rhythm.   No murmur heard. Pulmonary/Chest: Effort normal and breath sounds normal. No respiratory distress. She has no wheezes. She has no rales. She exhibits tenderness.  Abdominal: Soft. There is no tenderness.  Obese  Musculoskeletal: She exhibits edema (1+ pitting edema to b/l calves to mid-calf).  Neurological: She is alert.  Skin: Skin is warm and dry.  Psychiatric: She has a normal mood and affect.  Nursing note and vitals reviewed.    ED Treatments / Results  Labs (all labs ordered are listed, but only abnormal results are displayed) Labs Reviewed  BASIC METABOLIC PANEL - Abnormal; Notable for the following:       Result Value   Chloride 96 (*)    Glucose, Bld 113 (*)    Creatinine, Ser 1.28 (*)    Calcium 8.5 (*)    GFR calc non Af Amer 51 (*)    GFR calc Af Amer 59 (*)    All other components within normal limits  CBC - Abnormal; Notable for the following:    WBC 11.4 (*)    Hemoglobin 10.9 (*)    HCT 34.5 (*)    RDW 16.7 (*)    All other components within normal limits  TROPONIN I  URINALYSIS, ROUTINE W REFLEX MICROSCOPIC  I-STAT TROPOININ, ED    EKG  EKG Interpretation  Date/Time:  Friday September 04 2016 17:12:56 EST Ventricular Rate:  90 PR  Interval:  192 QRS Duration: 84 QT Interval:  372 QTC Calculation: 455 R Axis:   26 Text Interpretation:  Normal sinus rhythm Normal ECG No significant change since last tracing Confirmed by YAO  MD, DAVID ([id number]) on 09/04/2016 9:30:42 PM       Radiology Dg Chest 2 View  Result Date: 09/04/2016 CLINICAL DATA:  Severe right upper chest pain. EXAM: CHEST  2 VIEW COMPARISON:  05/04/2016 FINDINGS: There is no focal parenchymal opacity. There is no pleural effusion or pneumothorax. There is mild stable cardiomegaly. There is a single lead cardiac pacemaker. The osseous structures are unremarkable. IMPRESSION: No active cardiopulmonary disease. Electronically Signed   By: Kathreen Devoid   On: 09/04/2016 18:11   Ct Angio Chest Pe W And/or Wo Contrast  Result Date: 09/04/2016 CLINICAL DATA:  Right-sided chest pain since yesterday with dyspnea, flu like symptoms. EXAM: CT ANGIOGRAPHY CHEST WITH CONTRAST TECHNIQUE: Multidetector CT imaging of the chest was performed using the standard protocol during bolus administration of intravenous contrast. Multiplanar CT image reconstructions and MIPs were obtained to evaluate the vascular anatomy. CONTRAST:  100 cc Isovue 370 IV COMPARISON:  CXR 09/04/2016 pain and 05/04/2016 FINDINGS: Cardiovascular: AICD device projects over the anterior left chest wall with right ventricular defibrillator wire noted to cardiomegaly is noted without pericardial effusion. No acute pulmonary embolus, aortic aneurysm or dissection. Normal branch pattern for the great vessels. Mediastinum/Nodes: No supraclavicular nor axillary lymphadenopathy. There is a suggestion of right hilar  lymphadenopathy associated with a large masslike opacity abutting the superior mediastinum, seen within the right upper lobe and superior segment right lower lobe. Precarinal 1.5 cm short axis lymph node is also noted. Lungs/Pleura: Masslike consolidation in the right upper and superior segment of the right lower  lobe, measuring approximately 6.3 x 5.5 x 5.6 cm. Although findings may represent a pneumonic consolidation, followup is recommended after appropriate antibiotic therapy to ensure improvement and resolution and to exclude neoplasm. There is no pneumothorax nor effusion. The contralateral left lung is clear. Upper Abdomen: No adrenal mass nor space-occupying lesion of the liver. Musculoskeletal: No lytic or blastic disease. Review of the MIP images confirms the above findings. IMPRESSION: 1. Pulmonary masslike consolidation predominantly in the right upper lobe and to a lesser extent superior segment of right lower lobe, at its greatest measuring approximately 6.3 x 5.5 x 5.6 cm. Favor infectious etiology given lack of similar findings of on the CXR from 05/04/2016. A mass developing to this size in such short period of time without other ancillary findings to suggest neoplasm would be unusual. Nonetheless, follow-up to ensure clearance is recommended after antibiotic therapy. 2. Probable reactive mediastinal and right hilar lymphadenopathy. 3. No acute central pulmonary embolus. Electronically Signed   By: Ashley Royalty M.D.   On: 09/04/2016 22:39    Procedures Procedures (including critical care time)  Medications Ordered in ED Medications  iopamidol (ISOVUE-M) 61 % intrathecal injection 15 mL (not administered)  levofloxacin (LEVAQUIN) IVPB 750 mg (750 mg Intravenous New Bag/Given 09/04/16 2303)  iopamidol (ISOVUE-370) 76 % injection 100 mL (100 mLs Intravenous Contrast Given 09/04/16 2212)  morphine 4 MG/ML injection 4 mg (4 mg Intravenous Given 09/04/16 2302)  diazepam (VALIUM) injection 5 mg (5 mg Intravenous Given 09/04/16 2302)     Initial Impression / Assessment and Plan / ED Course  I have reviewed the triage vital signs and the nursing notes.  Pertinent labs & imaging results that were available during my care of the patient were reviewed by me and considered in my medical decision making (see  chart for details).    Patient is a 41 year old female with complex medical history as above who presents with 2 days of right-sided chest pain. See history of present illness for full details. In brief, her pain is sharp and burning in nature. It occurs at rest, however also worse with exertion. Is also worsened by deep breathing, movement, and palpation. Patient finished treatment for flu yesterday. She completed 5 days of Tamiflu. She has had a progressive cough over the last 3 days. Her pain is much worse when she coughs. She has no history of DVT or Pe. She endorses left calf pain, however her legs are symmetric and nontender.  Patient's labs notable for mild leukocytosis. Creatinine elevated, however near baseline. Given her pleuritic chest pain, CTA chest obtained. CT notable for right masslike consolidation. Correlating with x-ray, we feel this is consistent with infection. Patient was started on Levaquin. Initial IV dose given in ED. Pain control obtained with morphine and Valium. Plan is for treatment of community-acquired pneumonia with Levaquin. Patient is to follow-up with PCP on Monday. She will also need to follow-up to ensure resolution of lung consolidation. Return precautions were discussed in detail. Patient's pain improved here in the ED. She remained with normal oxygen saturation on room air throughout her time. Patient was discharged in stable condition.   Final Clinical Impressions(s) / ED Diagnoses   Final diagnoses:  Community acquired pneumonia  of right lung, unspecified part of lung    New Prescriptions New Prescriptions   HYDROCODONE-ACETAMINOPHEN (NORCO/VICODIN) 5-325 MG TABLET    Take 1 tablet by mouth every 6 (six) hours as needed for moderate pain or severe pain.   LEVOFLOXACIN (LEVAQUIN) 750 MG TABLET    Take 1 tablet (750 mg total) by mouth daily.     Clifton James, MD 09/05/16 0001    Drenda Freeze, MD 09/07/16 1537

## 2016-09-05 MED ORDER — LEVOFLOXACIN 750 MG PO TABS: 750.0000 mg | ORAL_TABLET | Freq: Every day | ORAL | 0 refills | Status: AC

## 2016-09-05 MED ORDER — HYDROCODONE-ACETAMINOPHEN 5-325 MG PO TABS: 1.0000 | ORAL_TABLET | Freq: Four times a day (QID) | ORAL | 0 refills | Status: AC | PRN

## 2016-09-05 NOTE — ED Notes (Signed)
Pt stable, ambulatory, states understanding of discharge instructions, family at bedside. 

## 2016-09-09 ENCOUNTER — Ambulatory Visit (INDEPENDENT_AMBULATORY_CARE_PROVIDER_SITE_OTHER): Payer: BLUE CROSS/BLUE SHIELD | Admitting: Internal Medicine

## 2016-09-09 ENCOUNTER — Encounter: Payer: Self-pay | Admitting: Internal Medicine

## 2016-09-09 VITALS — BP 126/64 | HR 77 | Ht 61.5 in | Wt 253.0 lb

## 2016-09-09 DIAGNOSIS — J45991 Cough variant asthma: Secondary | ICD-10-CM | POA: Diagnosis not present

## 2016-09-09 DIAGNOSIS — J181 Lobar pneumonia, unspecified organism: Secondary | ICD-10-CM

## 2016-09-09 DIAGNOSIS — R0609 Other forms of dyspnea: Secondary | ICD-10-CM | POA: Diagnosis not present

## 2016-09-09 DIAGNOSIS — J189 Pneumonia, unspecified organism: Secondary | ICD-10-CM

## 2016-09-09 MED ORDER — AMOXICILLIN-POT CLAVULANATE 875-125 MG PO TABS: 1.0000 | ORAL_TABLET | Freq: Two times a day (BID) | ORAL | 0 refills | Status: AC

## 2016-09-09 MED ORDER — FLUCONAZOLE 100 MG PO TABS: 100.0000 mg | ORAL_TABLET | Freq: Every day | ORAL | 0 refills | Status: DC

## 2016-09-09 MED ORDER — PREDNISONE 10 MG PO TABS: ORAL_TABLET | ORAL | 0 refills | Status: DC

## 2016-09-09 NOTE — Patient Instructions (Addendum)
For cough > mucinex 1200 mg every 12 hours with flutter valve as possible and if this fails    Augmentin 875 mg take one pill twice daily  X 10 days - take at breakfast and supper with large glass of water.  It would help reduce the usual side effects (diarrhea and yeast infections) if you ate cultured yogurt at lunch.   If develop thrush, diflucan 100 mg daily x 3 days   Prednisone 10 mg take  4 each am x 2 days,   2 each am x 2 days,  1 each am x 2 days and stop    Please schedule a follow up office visit in 2  weeks, sooner if needed  - add:  Needs spirometry even if feeling fine and also CXR if isn't all better

## 2016-09-09 NOTE — Progress Notes (Signed)
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 in [place] then one year after birth of oldest child in Summer 98 which would have been 1999 developed itching/rhinitis/wheezing > transient improvement only > eval by [facility] seasonal allergies but no shots,rx inhalers = saba helped the most then got more albuterol when that didn't work (neb) then 2007 dx as chf and some better but continue neb up to 4 x daily and not a lot better but lost nebulizer when moved  spring 2016 and actually fine s them  But  by early Jan 2017  still twice daily albuterol hfa so referred to pulmonary clinic 10/07/2015 by Dr Tempie Hoist with increased sob/noct cough despite optimal rx of chf.    History of Present Illness  10/07/2015 1st [facility] office visit/ [name]   Chief Complaint  Patient presents with  . Pulmonary Consult    Referred by Dr. Haroldine Laws.  Pt c/o SOB for the past month.  She states she gets winded walking from room to room.  She also c/o chest congestion and cough with yellow green sputum.    indolent onset progressive doe x 1.5 month  Plus newly worse hs with  Cough/gagging/  and albuterol  Relieves x 2h then repeats pattern  Dx with osa in 2001 and mostly on cpap until 2 y ago and none now but to be delivered in a week  Has overt hb on ppi but does not tol well  Assoc nasal congestion as well  rec Plan A = Automatic = Dulera 100 Take 2 puffs first thing in am and then another 2 puffs about 12 hours later - if not happy with sample or insurance coverage please call  Plan B = Backup Only use your albuterol (proair) as a rescue medication   Stop the protonix and take the zantac 150 mg after bfast and supper until return GERD  Diet   Please schedule a follow up office visit in 6 weeks, call sooner if needed with full PFTs ok to push back if needed   Late add  rec nuiron 150 mg daily   11/18/2015  Extended  f/u ov/[name] re: cough > sob since Jan 2017  Chief Complaint  Patient presents with  . Follow-up    Cough and SOB have not improved. She is coughing up yellow to green sputum.  She has had to use rescue inhaler x 2 since the last visit.   wakes up cpap about 2 am coughing and has to take it off Coughs seems worse in am  With immediately producing sev tbsp green mucus, worse off dulera 100 and on coreg 25 bid  Coughs so hard she gags but no vomiting or clear HB Mostly sob just  with cough now rec Continue duelra 100  Take 2 puffs first thing in am and then another 2 puffs about 12 hours later.  For cough > mucinex 1200 mg every 12 hours with flutter valve as possible and if this fails >  Demerol 50 mg every 4 hours (will make you sleepy) Augmentin 875 mg take one pill twice daily  X 10 days   Prednisone 10 mg take  4 each am x 2 days,   2 each am x 2 days,  1 each am x 2 days and stop  09/09/2016   ov/[name] re: pre op Consult   MO/ no longer on dulera / no needing saba  Chief Complaint  Patient presents with  . Pulmonary Consult    Referred by Dr. Greer Pickerel for pulmonary clearance for bariactric surgery.  Pt states recently dxed with Flu and then PNA- took her last dose of levaquin today. She has a lingering cough, prod with yellow sputum.   Aug 27 2016  Cough / runny nose tested pos flu , never fever > tamiflu Sep 01 2016 pain in ant R chest then on 7th similar pain posteriorly  Feb 8 more sob  Feb 9 much more sob sob, pain with coughing was worse  And sputum brown / no pain L / assoc with brown  both sides  By time of ov, mucus brown to yellow /all  pain gone and main issue is fatigue > sob but doe = MMRC3 = can't walk 100 yards even at a slow pace at a flat grade s stopping due to sob    Poor sleep due to orthpnea/ better L side down an if not coughing   No obvious day to day or daytime variability or assoc   mucus plugs or  hemoptysis or cp or chest tightness, subjective wheeze or overt sinus or hb symptoms. No unusual exp hx or h/o childhood pna/ asthma or knowledge of premature birth.    Also denies any obvious fluctuation of symptoms with weather or environmental changes or other aggravating or alleviating factors except as outlined above   Current Medications, Allergies, Complete Past Medical History, Past Surgical History, Family History, and Social History were reviewed in [employer] record.  ROS  The following are not active complaints unless bolded sore throat, dysphagia, dental problems, itching, sneezing,  nasal congestion or excess/ purulent secretions, ear ache,   fever, chills, sweats, unintended wt loss, classically pleuritic or exertional cp,  orthopnea pnd or leg swelling, presyncope, palpitations, abdominal pain, anorexia, nausea, vomiting, diarrhea  or change in bowel or bladder habits, change in stools or urine, dysuria,hematuria,  rash, arthralgias, visual complaints, headache, numbness, weakness or ataxia or problems with walking or coordination,  change in mood/affect or memory.                      Objective:   Physical Exam  amb obese bf nad  09/09/2016        253  11/18/2015       256   10/07/15 262 lb 3.2 oz (118.933 kg)  09/12/15 257 lb 8 oz (116.801 kg)  08/16/15 250 lb (113.399 kg)    Vital signs reviewed  - Note on arrival 02 sats  96% on RA     HEENT: nl dentition, turbinates, and oropharynx. Nl external ear canals without cough reflex   NECK :  without JVD/Nodes/TM/ nl carotid upstrokes bilaterally   LUNGS: no acc muscle use,  Nl contour chest which is clear to A and P bilaterally without cough on insp or exp maneuvers   CV:  RRR  no s3 or murmur or increase in P2,  Trace to 1+ pitting bilateral lower ext  edema   ABD:  soft and nontender with nl inspiratory excursion in the supine position. No bruits or organomegaly, bowel sounds nl  MS:   Nl gait/ ext warm without deformities, calf tenderness, cyanosis or clubbing No obvious joint restrictions   SKIN: warm and dry without lesions    NEURO:  alert, approp, nl sensorium with  no motor deficits     I personally reviewed images and agree with radiology impression as follows:  CTa Chest   09/04/16  Pulmonary masslike consolidation predominantly in the right upper lobe and to a lesser extent superior segment of right lower lobe, at its greatest measuring approximately 6.3 x 5.5 x 5.6 cm. Favor infectious etiology given lack of similar findings of on the CXR from 05/04/2016.            Assessment & Plan:

## 2016-09-10 ENCOUNTER — Encounter: Payer: Self-pay | Admitting: Internal Medicine

## 2016-09-10 DIAGNOSIS — J189 Pneumonia, unspecified organism: Secondary | ICD-10-CM | POA: Insufficient documentation

## 2016-09-10 NOTE — Assessment & Plan Note (Signed)
-   Allergy profile 10/07/15  >  Eos 0.3 /  IgE  39 Pos RAST grass - 11/18/2015  After extensive coaching HFA effectiveness =    75% > rechalleng with dulera 100 2bid  - Spirometry 11/18/2015  FEV1 1.58 (68%)  Ratio 81 pre rx with no curvature      No flare even with CAP so favor uacs over asthma here.

## 2016-09-10 NOTE — Assessment & Plan Note (Addendum)
Spirometry 10/07/2015  FEV1 1.58 (68%)  Ratio 41 before rx but ? Effort related  -   10/07/2015  extensive coaching HFA effectiveness =    90% > try dulera 100 2bid  - Allergy profile 10/07/15  >  Eos 0.3 /  IgE  39 Pos RAST grass   Had been doing much better before acute illness c/w CAP and clearly not ready for elective surgery - see CAP.  Will re-eval in 2 weeks with spirometry on return to see if we can clear here then.    Total time devoted to counseling  > 50 % of   60 min preop eval/ office consultation visit:  review case with pt/ discussion of options/alternatives/ personally creating written customized instructions  in presence of pt  then going over those specific  Instructions directly with the pt including how to use all of the meds but in particular covering each new medication in detail and the difference between the maintenance= "automatic" meds and the prns using an action plan format for the latter (If this problem/symptom => do that organization reading Left to right).  Please see AVS from this visit for a full list of these instructions which I personally wrote for this pt and  are unique to this visit.

## 2016-09-10 NOTE — Assessment & Plan Note (Signed)
See CTa chest 09/04/16 rx levaquin complete 09/09/2016 > rec 10 more days augmentin  Needs ov in 2 weeks to clear for surgery, not necessarily a f/u cxr since did not show on corresponding cxr but clear clinical evidence of pna and improvement on empirical abx so if back to baseline clinically and spirometry ok should be able to clear then.

## 2016-09-10 NOTE — Assessment & Plan Note (Signed)
Body mass index is 47.03  trending slt down Lab Results  Component Value Date   TSH 2.15 10/07/2015     Contributing to gerd risk/ doe/reviewed the need and the process to achieve and maintain neg calorie balance > defer f/u primary care including intermittently monitoring thyroid status

## 2016-09-14 ENCOUNTER — Encounter: Payer: BLUE CROSS/BLUE SHIELD | Attending: General Surgery | Admitting: Skilled Nursing Facility1

## 2016-09-14 ENCOUNTER — Encounter: Payer: Self-pay | Admitting: Skilled Nursing Facility1

## 2016-09-14 DIAGNOSIS — Z713 Dietary counseling and surveillance: Secondary | ICD-10-CM | POA: Insufficient documentation

## 2016-09-14 DIAGNOSIS — E669 Obesity, unspecified: Secondary | ICD-10-CM

## 2016-09-14 DIAGNOSIS — Z6841 Body Mass Index (BMI) 40.0 and over, adult: Secondary | ICD-10-CM | POA: Insufficient documentation

## 2016-09-14 NOTE — Progress Notes (Signed)
  Pre-Operative Nutrition Class:  Appt start time: 1324   End time:  1830.  Patient was seen on 09/14/2016 for Pre-Operative Bariatric Surgery Education at [facility].   Surgery date: Tentivly March 5th Surgery type: Sleeve Gastrectomy  Start weight at [facility]: 257.14 pounds Weight today: 256 pounds  TANITA  BODY COMP RESULTS  09/14/2016   BMI (kg/m^2) 47.6   Fat Mass (lbs) 133.6   Fat Free Mass (lbs) 122.4   Total Body Water (lbs) 90.4   Samples given per MNT protocol. Patient educated on appropriate usage: Bariatric Advantage Vitamins Calcium Citrate Lot # [id number] Exp: 02/17/2017  Renee Pain Protein Powder Lot # [id number] Exp: 12/2017  Premier Protein: Lot: [id number] Exp: 21/04/2017  The following the learning objectives were met by the patient during this course:  Identify Pre-Op Dietary Goals and will begin 2 weeks pre-operatively  Identify appropriate sources of fluids and proteins   State protein recommendations and appropriate sources pre and post-operatively  Identify Post-Operative Dietary Goals and will follow for 2 weeks post-operatively  Identify appropriate multivitamin and calcium sources  Describe the need for physical activity post-operatively and will follow MD recommendations  State when to call healthcare provider regarding medication questions or post-operative complications  Handouts given during class include:  Pre-Op Bariatric Surgery Diet Handout  Protein Shake Handout  Post-Op Bariatric Surgery Nutrition Handout  BELT Program Information Flyer  Support Group Information Flyer  [facility] Bariatric Supplements Price List  Follow[facility]: [facility] advancement per MD.

## 2016-09-16 ENCOUNTER — Encounter: Payer: BLUE CROSS/BLUE SHIELD | Admitting: Skilled Nursing Facility1

## 2016-09-16 DIAGNOSIS — E669 Obesity, unspecified: Secondary | ICD-10-CM

## 2016-09-16 NOTE — Progress Notes (Signed)
   Assessment:   Final [facility] Appointment. Pt states she is ready for surgery and has been doing well with the pre-op diet.   Start Wt at [facility]:  Wt:256.8 pounds BMI: 47.68   MEDICATIONS: See List   DIETARY INTAKE:  24-hr recall:  B ( AM): protein shake Snk ( AM): carrots L ( PM): grilled chicken Snk ( PM):  D ( PM): grilled chicken and veggies  Snk ( PM):  Beverages: water, crystal light  Usual physical activity: ADL's  Diet to Follow: 1500 calories 170 g carbohydrates 112 g protein 42 g fat   Nutritional Diagnosis:  [place]-3.3 Overweight/obesity related to past poor dietary habits and physical inactivity as evidenced by patient w/ recent sleeve gastrectomy surgery following dietary guidelines for continued weight loss.    Intervention:  Nutrition counseling for upcoming Bariatric Surgery.  Teaching Method Utilized:  Visual Auditory Hands on   Barriers to learning/adherence to lifestyle change: none identified   Demonstrated degree of understanding via:  Teach Back   Monitoring/Evaluation:  Dietary intake, exercise, and body weight prn.

## 2016-09-21 ENCOUNTER — Other Ambulatory Visit (HOSPITAL_COMMUNITY): Payer: Self-pay | Admitting: Internal Medicine

## 2016-09-21 DIAGNOSIS — I509 Heart failure, unspecified: Secondary | ICD-10-CM

## 2016-09-21 MED FILL — TORSEMIDE 20 MG TABLET: 20 | 30 days supply | Qty: 120 | Fill #2

## 2016-09-21 MED FILL — ENTRESTO 49 MG-51 MG TABLET: 49-51 | 30 days supply | Qty: 60 | Fill #3

## 2016-09-21 MED FILL — ISOSORBIDE MN ER 60 MG TAB: 60 | 30 days supply | Qty: 30 | Fill #3

## 2016-09-21 MED FILL — CARVEDILOL 25 MG TABLET: 25 | 30 days supply | Qty: 60 | Fill #1

## 2016-09-21 MED FILL — AMLODIPINE BESYLATE 2.5 MG: 2.5 | 30 days supply | Qty: 30 | Fill #3

## 2016-09-21 MED FILL — SPIRONOLACTONE 25 MG TABLET: 25 | 30 days supply | Qty: 15 | Fill #2

## 2016-09-22 MED FILL — KLOR-CON M20 TABLET: 20 | 30 days supply | Qty: 60 | Fill #0

## 2016-09-22 MED FILL — hydrALAZINE HCL 50 MG TABS: 50 | 30 days supply | Qty: 135 | Fill #0

## 2016-09-25 ENCOUNTER — Encounter: Payer: Self-pay | Admitting: Internal Medicine

## 2016-09-25 ENCOUNTER — Ambulatory Visit (INDEPENDENT_AMBULATORY_CARE_PROVIDER_SITE_OTHER): Payer: BLUE CROSS/BLUE SHIELD | Admitting: Internal Medicine

## 2016-09-25 ENCOUNTER — Ambulatory Visit (INDEPENDENT_AMBULATORY_CARE_PROVIDER_SITE_OTHER)
Admission: RE | Admit: 2016-09-25 | Discharge: 2016-09-25 | Disposition: A | Discharge status: Home / Self Care | Payer: BLUE CROSS/BLUE SHIELD | Source: Ambulatory Visit | Attending: Internal Medicine | Admitting: Internal Medicine

## 2016-09-25 VITALS — BP 126/82 | HR 75 | Ht 61.5 in | Wt 253.8 lb

## 2016-09-25 DIAGNOSIS — J189 Pneumonia, unspecified organism: Secondary | ICD-10-CM

## 2016-09-25 DIAGNOSIS — J45991 Cough variant asthma: Secondary | ICD-10-CM

## 2016-09-25 DIAGNOSIS — J181 Lobar pneumonia, unspecified organism: Secondary | ICD-10-CM

## 2016-09-25 DIAGNOSIS — R0609 Other forms of dyspnea: Secondary | ICD-10-CM

## 2016-09-25 NOTE — Progress Notes (Signed)
Spoke with pt and notified of results per Dr. Wert. Pt verbalized understanding and denied any questions. 

## 2016-09-25 NOTE — Progress Notes (Signed)
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 [place] then one year after birth of oldest child in Summer 98 which would have been 1999 developed itching/rhinitis/wheezing > transient improvement only > eval by [facility] seasonal allergies but no shots,rx inhalers = saba helped the most then got more albuterol when that didn't work (neb) then 2007 dx as chf and some better but continue neb up to 4 x daily and not a lot better but lost nebulizer when moved  spring 2016 and actually fine s them  But  by early Jan 2017  still twice daily albuterol hfa so referred to pulmonary clinic 10/07/2015 by Dr Tempie Hoist with increased sob/noct cough despite optimal rx of chf.    History of Present Illness  10/07/2015 1st [facility] office visit/ [name]   Chief Complaint  Patient presents with  . Pulmonary Consult    Referred by Dr. Haroldine Laws.  Pt c/o SOB for the past month.  She states she gets winded walking from room to room.  She also c/o chest congestion and cough with yellow green sputum.    indolent onset progressive doe x 1.5 month  Plus newly worse hs with  Cough/gagging/  and albuterol  Relieves x 2h then repeats pattern  Dx with osa in 2001 and mostly on cpap until 2 y ago and none now but to be delivered in a week  Has overt hb on ppi but does not tol well  Assoc nasal congestion as well  rec Plan A = Automatic = Dulera 100 Take 2 puffs first thing in am and then another 2 puffs about 12 hours later - if not happy with sample or insurance coverage please call  Plan B = Backup Only use your albuterol (proair) as a rescue medication   Stop the protonix and take the zantac 150 mg after bfast and supper until return GERD  Diet   Please schedule a follow up office visit in 6 weeks, call sooner if needed with full PFTs ok to push back if needed   Late add  rec nuiron 150 mg daily   11/18/2015  Extended  f/u ov/[name] re: cough > sob since Jan 2017  Chief Complaint  Patient presents with  . Follow-up    Cough and SOB have not improved. She is coughing up yellow to green sputum.  She has had to use rescue inhaler x 2 since the last visit.   wakes up cpap about 2 am coughing and has to take it off Coughs seems worse in am  With immediately producing sev tbsp green mucus, worse off dulera 100 and on coreg 25 bid  Coughs so hard she gags but no vomiting or clear HB Mostly sob just  with cough now rec Continue duelra 100  Take 2 puffs first thing in am and then another 2 puffs about 12 hours later.  For cough > mucinex 1200 mg every 12 hours with flutter valve as possible and if this fails >  Demerol 50 mg every 4 hours (will make you sleepy) Augmentin 875 mg take one pill twice daily  X 10 days   Prednisone 10 mg take  4 each am x 2 days,   2 each am x 2 days,  1 each am x 2 days and stop  09/09/2016   ov/[name] re: pre op Consult   MO/ no longer on dulera / no needing saba  Chief Complaint  Patient presents with  . Pulmonary Consult    Referred by Dr. Greer Pickerel for pulmonary clearance for bariactric surgery.  Pt states recently dxed with Flu and then PNA- took her last dose of levaquin today. She has a lingering cough, prod with yellow sputum.   Aug 27 2016  Cough / runny nose tested pos flu , never fever > tamiflu Sep 01 2016 pain in ant R chest then on 7th similar pain posteriorly  Feb 8 more sob  Feb 9 much more sob sob, pain with coughing was worse  And sputum brown / no pain L / assoc with brown  both sides By time of ov, mucus brown to yellow /all  pain gone and main issue is fatigue > sob but doe = MMRC3 = can't walk 100 yards even at a slow pace at a flat grade s stopping due to sob    Poor sleep due to orthpnea/ better L side down and if not start  coughing  rec  For cough > mucinex 1200 mg every 12 hours with flutter valve as  possible and if this fails   Augmentin 875 mg take one pill twice daily  X 10 days - take at breakfast and supper with large glass of water.  It would help reduce the usual side effects (diarrhea and yeast infections) if you ate cultured yogurt at lunch.  If develop thrush, diflucan 100 mg daily x 3 days  Prednisone 10 mg take  4 each am x 2 days,   2 each am x 2 days,  1 each am x 2 days and stop  Please schedule a follow up office visit in 2  weeks, sooner if needed  - add:  Needs spirometry even if feeling fine and also CXR if isn't all better      09/25/2016  f/u ov/[name] re:  preop clearance for gastric sleave  Chief Complaint  Patient presents with  . Follow-up    for Pneumonia. Pt. states she feels better but still has the cough.   no need for saba / no longer on dulera or any maint rx  MMRC1 = can walk nl pace, flat grade, can't hurry or go uphills or steps s sob    No obvious day to day or daytime variability or assoc excess/ purulent sputum or mucus plugs or hemoptysis or cp or chest tightness, subjective wheeze or overt sinus or hb symptoms. No unusual exp hx or h/o childhood pna/ asthma or knowledge of premature birth.  Sleeping ok without nocturnal  or early am exacerbation  of respiratory  c/o's or need for noct saba. Also denies any obvious fluctuation of symptoms with weather or environmental changes or other aggravating or alleviating factors except as outlined above   Current Medications, Allergies, Complete Past Medical History, Past Surgical History, Family History, and Social History were reviewed in [employer] record.  ROS  The following are not active complaints unless bolded sore throat, dysphagia, dental problems, itching, sneezing,  nasal congestion or excess/ purulent secretions, ear ache,   fever, chills, sweats, unintended wt loss, classically pleuritic or exertional cp,  orthopnea pnd or leg swelling, presyncope, palpitations, abdominal  pain, anorexia, nausea, vomiting, diarrhea  or change in bowel or bladder habits, change in stools or urine, dysuria,hematuria,  rash, arthralgias, visual complaints, headache, numbness, weakness or  ataxia or problems with walking or coordination,  change in mood/affect or memory.                  Objective:   Physical Exam  amb obese bf nad   09/25/2016         254  09/09/2016       253  11/18/2015       256   10/07/15 262 lb 3.2 oz (118.933 kg)  09/12/15 257 lb 8 oz (116.801 kg)  08/16/15 250 lb (113.399 kg)    Vital signs reviewed  - Note on arrival 02 sats  97% on RA     HEENT: nl dentition, turbinates, and oropharynx. Nl external ear canals without cough reflex   NECK :  without JVD/Nodes/TM/ nl carotid upstrokes bilaterally   LUNGS: no acc muscle use,  Nl contour chest which is clear to A and P bilaterally without cough on insp or exp maneuvers   CV:  RRR  no s3 or murmur or increase in P2,  Trace to 1+ pitting bilateral lower ext  edema   ABD:  soft and nontender with nl inspiratory excursion in the supine position. No bruits or organomegaly, bowel sounds nl  MS:  Nl gait/ ext warm without deformities, calf tenderness, cyanosis or clubbing No obvious joint restrictions   SKIN: warm and dry without lesions    NEURO:  alert, approp, nl sensorium with  no motor deficits    CXR PA and Lateral:   09/25/2016 :    I personally reviewed images and agree with radiology impression as follows:    Interval clearing of right upper lobe airspace consolidation. Currently no edema or consolidation. Stable cardiac silhouette. No evident adenopathy.             Assessment & Plan:

## 2016-09-25 NOTE — Patient Instructions (Signed)
Please remember to go to the  x-ray department downstairs in the basement  for your tests - we will call you with the results when they are available and clear you for your surgery then   Pulmonary follow up can be as needed

## 2016-09-27 NOTE — Assessment & Plan Note (Addendum)
-   Allergy profile 10/07/15  >  Eos 0.3 /  IgE  39 Pos RAST grass - 11/18/2015  After extensive coaching HFA effectiveness =    75% > rechalleng with dulera 100 2bid  - Spirometry 11/18/2015  FEV1 1.58 (68%)  Ratio 81 pre rx with no curvature   off all rx and on coreg   So she clearly does not have asthma but could be experiencing uacs from entresto( which has a 9% incidence of cough and definitely raises bradykinin levels like an ACEi)  and if so is prone to cough worse with URI's or p ET but no def contraindication to postpone surgery or stop entresto.   However, if cough persists next step is to stop entresto and replace with just the valsartan component but would need to see cards for this.  In meantime needs max rx for GERD/ gerd diet and of course wt loss

## 2016-09-27 NOTE — Assessment & Plan Note (Signed)
See CTa chest 09/04/16 rx levaquin complete 09/09/2016 > rec 10 more days augmentin> resolved 09/25/2016

## 2016-09-27 NOTE — Assessment & Plan Note (Addendum)
Spirometry 10/07/2015  FEV1 1.58 (68%)  Ratio 41 before rx but ? Effort related  -   10/07/2015  extensive coaching HFA effectiveness =    90% > try dulera 100 2bid  - Allergy profile 10/07/15  >  Eos 0.3 /  IgE  39 Pos RAST grass  - Spirometry 09/25/2016  FEV1 1.68 (74%)  Ratio 78   off all rx and on coreg   Improved with minimal restrictive changes on spirometry explained by body habitus  I had an extended final summary discussion with the patient reviewing all relevant studies completed to date and  lasting 15 to 20 minutes of a 25 minute visit on the following issues:    Discussed in detail all the  indications, usual  risks and alternatives  relative to the benefits with patient who agrees to proceed with surgery and understands need for early mobilization/ min narcs and pulmonary f/u can be prn

## 2016-09-27 NOTE — Assessment & Plan Note (Signed)
Body mass index is 47.18 kg/m.  trending up still  Lab Results  Component Value Date   TSH 2.15 10/07/2015     Contributing to gerd risk/ doe/reviewed the need and the process to achieve and maintain neg calorie balance > defer f/u primary care including intermittently monitoring thyroid status

## 2016-10-06 ENCOUNTER — Encounter (HOSPITAL_COMMUNITY): Payer: Self-pay | Admitting: Emergency Medicine

## 2016-10-06 ENCOUNTER — Emergency Department (HOSPITAL_COMMUNITY)
Admission: EM | Admit: 2016-10-06 | Discharge: 2016-10-06 | Disposition: A | Discharge status: Home / Self Care | Payer: BLUE CROSS/BLUE SHIELD | Attending: Emergency Medicine | Admitting: Emergency Medicine

## 2016-10-06 DIAGNOSIS — E860 Dehydration: Secondary | ICD-10-CM | POA: Diagnosis not present

## 2016-10-06 DIAGNOSIS — Z950 Presence of cardiac pacemaker: Secondary | ICD-10-CM | POA: Diagnosis not present

## 2016-10-06 DIAGNOSIS — I5032 Chronic diastolic (congestive) heart failure: Secondary | ICD-10-CM | POA: Diagnosis not present

## 2016-10-06 DIAGNOSIS — R197 Diarrhea, unspecified: Secondary | ICD-10-CM

## 2016-10-06 DIAGNOSIS — J45909 Unspecified asthma, uncomplicated: Secondary | ICD-10-CM | POA: Diagnosis not present

## 2016-10-06 DIAGNOSIS — I110 Hypertensive heart disease with heart failure: Secondary | ICD-10-CM | POA: Insufficient documentation

## 2016-10-06 LAB — COMPREHENSIVE METABOLIC PANEL
ALT: 14 U/L (ref 14–54)
ANION GAP: 9 (ref 5–15)
AST: 17 U/L (ref 15–41)
Albumin: 3.7 g/dL (ref 3.5–5.0)
Alkaline Phosphatase: 53 U/L (ref 38–126)
BILIRUBIN TOTAL: 0.5 mg/dL (ref 0.3–1.2)
BUN: 22 mg/dL — ABNORMAL HIGH (ref 6–20)
CO2: 23 mmol/L (ref 22–32)
Calcium: 9.1 mg/dL (ref 8.9–10.3)
Chloride: 105 mmol/L (ref 101–111)
Creatinine, Ser: 1.96 mg/dL — ABNORMAL HIGH (ref 0.44–1.00)
GFR, EST AFRICAN AMERICAN: 35 mL/min — AB (ref >60)
GFR, EST NON AFRICAN AMERICAN: 30 mL/min — AB (ref >60)
Glucose, Bld: 130 mg/dL — ABNORMAL HIGH (ref 65–99)
POTASSIUM: 3.3 mmol/L — AB (ref 3.5–5.1)
Sodium: 137 mmol/L (ref 135–145)
TOTAL PROTEIN: 8 g/dL (ref 6.5–8.1)

## 2016-10-06 LAB — CBC
HEMATOCRIT: 41.3 % (ref 36.0–46.0)
HEMOGLOBIN: 13.2 g/dL (ref 12.0–15.0)
MCH: 26 pg (ref 26.0–34.0)
MCHC: 32 g/dL (ref 30.0–36.0)
MCV: 81.5 fL (ref 78.0–100.0)
Platelets: 262 10*3/uL (ref 150–400)
RBC: 5.07 MIL/uL (ref 3.87–5.11)
RDW: 17 % — ABNORMAL HIGH (ref 11.5–15.5)
WBC: 7.3 10*3/uL (ref 4.0–10.5)

## 2016-10-06 LAB — C DIFFICILE QUICK SCREEN W PCR REFLEX
C DIFFICILE (CDIFF) INTERP: NOT DETECTED
C DIFFICILE (CDIFF) TOXIN: NEGATIVE
C DIFFICLE (CDIFF) ANTIGEN: NEGATIVE

## 2016-10-06 LAB — I-STAT BETA HCG BLOOD, ED (MC, WL, AP ONLY): I-stat hCG, quantitative: <5 m[IU]/mL (ref <5)

## 2016-10-06 LAB — LIPASE, BLOOD: Lipase: 21 U/L (ref 11–51)

## 2016-10-06 NOTE — ED Triage Notes (Signed)
Patient sent by PCP to ED for low BP and possible dehydration due to diarrhea x 4 days.

## 2016-10-06 NOTE — ED Notes (Signed)
Pt not able to urinate.  Pt stated that she has had diarrhea 4x days.  Will try again later.

## 2016-10-06 NOTE — ED Notes (Signed)
Pt cannot urinate. 

## 2016-10-06 NOTE — ED Provider Notes (Signed)
[facility] DEPT Provider Note   CSN: [id number] Arrival date & time: 10/06/16  1228     History   Chief Complaint Chief Complaint  Patient presents with  . Diarrhea  . sent from PCP for poss dehydration    HPI [name] is a 41 y.o. female.  HPI Patient has had diarrhea for 4 days. She reports she's had multiple episodes per day. She is at up to 12-15. No associated vomiting. Minimal abdominal discomfort with some cramping. Patient has been treated with several courses of antibiotics in February. She has both a course of Zithromax as well as a course of Augmentin for bronchitis\pneumonia symptoms. She reports she has been feeling fatigued and slightly lightheaded over these past couple of days. No urinary symptoms. He is taking in fluids orally. He is referred by her PCP for possible dehydration. Past Medical History:  Diagnosis Date  . Acute bronchitis and bronchiolitis   . Anemia    Iron supplements in the past  . Asthma    Inhaler prn;triggered by seasonal changes mostly cold weathert;last asthma  attack years ago  . Atrial fibrillation ([place])   . CHF (congestive heart failure) ([place]) 01/2006   Has Pacemaker and Defibrillator;was on heart meds;no longer taking since pregancy  . CHF (congestive heart failure) ([place])   . Hypertension    currently on Labetalol 200mg  BID  . Hypertension   . Infection    UTI;not frequent;last infection was in June  . Infection    Yeast;not frequent  . Migraines    Can't take Procardia XL, causes migraines  . Other primary cardiomyopathies   . Paroxysmal ventricular tachycardia ([place])   . Varicose veins 1998   Developed during last pregnancy    Patient Active Problem List   Diagnosis Date Noted  . CAP (community acquired pneumonia) 09/10/2016  . Genetic testing 07/12/2016  . Family history of ovarian cancer 05/27/2016  . Family history of breast cancer in female 05/27/2016  . Pre-operative cardiovascular examination 12/11/2015  .  Cough variant asthma vs UACS 11/19/2015  . Chronic diastolic heart failure ([place]) 10/29/2015  . Dyspnea 10/07/2015  . Chest pain 05/04/2015  . Bradycardia 05/04/2015  . Severe obesity (BMI >= 40) ([place]) 02/27/2014  . OSA (obstructive sleep apnea) 01/25/2014  . Chronic bronchitis ([place]) 01/25/2014  . CHF (congestive heart failure) ([place]) 12/08/2013  . Uncontrolled hypertension 12/08/2013  . Hypertensive crisis 12/12/2012  . Supervision of high-risk pregnancy 06/16/2012  . Low lying placenta with hemorrhage, antepartum 06/16/2012  . Previous cesarean delivery, delivered, with or without mention of antepartum condition 06/16/2012  . undesired fertility 06/16/2012  . History of pre-eclampsia in prior pregnancy, currently pregnant 06/02/2012  . Marginal placenta previa 05/24/2012  . Vaginal bleeding in pregnancy 05/24/2012  . Bilateral ovarian cysts 05/24/2012  . Anemia 05/05/2012  . BV (bacterial vaginosis) 04/04/2012  . Pacemaker 04/04/2012  . Cardiac defibrillator in situ 04/04/2012  . Hypertension complicating pregnancy 84/13/2440  . Cardiomyopathy, hypertensive ([place]) 02/03/2012  . Dizziness 12/17/2011  . Chronic systolic heart failure ([place]) 04/13/2011  . Hypertension 03/05/2011  . VENTRICULAR TACHYCARDIA, PAROXYSMAL 10/02/2010  . CARDIOMYOPATHY 08/27/2009  . Atrial fibrillation ([place]) 08/27/2009  . CHF 08/27/2009    Past Surgical History:  Procedure Laterality Date  . BREAST REDUCTION SURGERY  03/05/1994   d/t back, shoulder, and neck pain (42 F)  . CARDIAC CATHETERIZATION     EF 30-35%  . CARDIAC CATHETERIZATION N/A 05/04/2015   Procedure: Left Heart Cath and Coronary Angiography;  Surgeon: Belva Crome, MD;  Location: [facility] LAB;  Service: Cardiovascular;  Laterality: N/A;  . CARDIAC DEFIBRILLATOR PLACEMENT    . CESAREAN SECTION  1998   d/t preeclampsia  . IMPLANTABLE CARDIOVERTER DEFIBRILLATOR (ICD) GENERATOR CHANGE N/A 09/10/2014   Procedure: ICD GENERATOR CHANGE;   Surgeon: Evans Lance, MD;  Location: [facility] CATH LAB;  Service: Cardiovascular;  Laterality: N/A;  . INSERT / REPLACE / REMOVE PACEMAKER   02/10/2006   Status post implantable cardioverter-defibrillator insertion  .Marland KitchenMarland KitchenMarland KitchenThe Medtronic Maximo VR, model 9562, single chamber    OB History    Gravida Para Term Preterm AB Living   2 1   1   1    SAB TAB Ectopic Multiple Live Births           1       Home Medications    Prior to Admission medications   Medication Sig Start Date End Date Taking? Authorizing Provider  acetaminophen (TYLENOL) 500 MG tablet Take 1,000 mg by mouth every 6 (six) hours as needed for mild pain, moderate pain or headache.    Yes Historical Provider, MD  albuterol (PROVENTIL HFA;VENTOLIN HFA) 108 (90 Base) MCG/ACT inhaler Inhale 2 puffs into the lungs every 6 (six) hours as needed for wheezing or shortness of breath. Rescue inhaler 09/12/15  Yes Shaune Pascal Bensimhon, MD  amLODipine (NORVASC) 2.5 MG tablet TAKE 1 TABLET BY MOUTH DAILY. 06/17/16  Yes Jolaine Artist, MD  calcium carbonate (OSCAL) 1500 (600 Ca) MG TABS tablet Take 600 mg of elemental calcium by mouth 3 (three) times daily with meals.   Yes Historical Provider, MD  carvedilol (COREG) 25 MG tablet TAKE 1 TABLET BY MOUTH TWICE DAILY WITH MEALS 08/21/16  Yes Jolaine Artist, MD  cetirizine (ZYRTEC) 10 MG tablet Take 10 mg by mouth daily as needed for allergies.    Yes Historical Provider, MD  ferrous sulfate 325 (65 FE) MG tablet Take 1 tablet (325 mg total) by mouth 3 (three) times daily with meals. 10/08/15  Yes Tanda Rockers, MD  hydrALAZINE (APRESOLINE) 50 MG tablet TAKE 1 AND 1/2 TABLETS BY MOUTH 3 TIMES DAILY. 09/22/16  Yes Jolaine Artist, MD  ibuprofen (ADVIL,MOTRIN) 200 MG tablet Take 400 mg by mouth every 6 (six) hours as needed for headache, mild pain or moderate pain.    Yes Historical Provider, MD  isosorbide mononitrate (IMDUR) 60 MG 24 hr tablet TAKE 1 TABLET BY MOUTH DAILY. 06/17/16  Yes Shaune Pascal  Bensimhon, MD  potassium chloride SA (K-DUR,KLOR-CON) 20 MEQ tablet Take 1 tablet (20 mEq total) by mouth 2 (two) times daily. 10/29/15  Yes [name], PA-C  ranitidine (ZANTAC) 150 MG tablet Take 150 mg by mouth 2 (two) times daily.   Yes Historical Provider, MD  sacubitril-valsartan (ENTRESTO) 49-51 MG Take 1 tablet by mouth 2 (two) times daily. 06/09/16  Yes [name], PA-C  spironolactone (ALDACTONE) 25 MG tablet TAKE 1/2 TABLET BY MOUTH DAILY. 07/16/16  Yes [name], PA-C  torsemide (DEMADEX) 20 MG tablet TAKE 3 TABS BY MOUTH EVERY MORNING AND 1 TAB EVERY EVENING Patient taking differently: Take 20-60 mg by mouth 2 (two) times daily. Pt takes three tablets in the morning and one at bedtime. 10/29/15  Yes [name], PA-C    Family History Family History  Problem Relation Age of Onset  . Sickle cell trait Daughter   . Other Father     Varicose veins  .  Hypertension Father   . Diabetes Father     Controlled w/ diet and exercise  . Colon polyps Father     approx 3-4  . Asthma Daughter   . Seizures Maternal Uncle   . Migraines Mother   . Ulcers Mother   . Rheum arthritis Mother   . Colon polyps Mother     approx 2 polyps  . Migraines Daughter   . Migraines Cousin     Maternal  . Uterine cancer Paternal Grandmother     d. 60-61  . Prostate cancer Maternal Uncle     dx 69-70; surgery and rad  . Lung cancer Maternal Uncle 65    d. 69-70; smoker  . Lupus Maternal Aunt   . Hypertension Paternal Aunt   . Other Daughter     blood transfusion @ birth;platelets were low  . Diabetes Maternal Aunt   . Lung cancer Maternal Aunt     dx 71-72; +chemo  . Diabetes Paternal Aunt     x 2  . Diabetes Paternal Uncle   . Hypertension    . Heart disease    . Cancer Cousin     maternal 1st cousin d. early 51s; NOS cancer  . Breast cancer Cousin     maternal 1st cousin dx 76-48  . Breast cancer Other     maternal great aunt (MGM's sister)  d. breast cancer in her late 15s  . Kidney cancer Cousin     paternal 1st cousin dx 17s; nephrectomy; not a smoker  . Ovarian cancer Paternal Aunt 87    paternal aunt (father's maternal half-sister)  . Breast cancer Paternal Aunt   . Ovarian cancer Paternal Aunt   . Uterine cancer Paternal Aunt   . Cancer Other     paternal great aunt (PGM's sister) dx NOS cancer  . Cancer Other     paternal great uncle (PGM's brother) dx NOS cancer  . Colon cancer Neg Hx     Social History Social History  Substance Use Topics  . Smoking status: Never Smoker  . Smokeless tobacco: Never Used  . Alcohol use No     Allergies   Percocet [oxycodone-acetaminophen]   Review of Systems Review of Systems 10 Systems reviewed and are negative for acute change except as noted in the HPI.   Physical Exam Updated Vital Signs BP 102/71 (BP Location: Right Arm)   Pulse 85   Temp 98.1 F (36.7 C) (Oral)   Resp 18   LMP 09/25/2016   SpO2 92%   Physical Exam  Constitutional: She is oriented to person, place, and time. She appears well-developed and well-nourished. No distress.  HENT:  Head: Normocephalic and atraumatic.  Eyes: Conjunctivae and EOM are normal.  Neck: Neck supple.  Cardiovascular: Normal rate and regular rhythm.   No murmur heard. Pulmonary/Chest: Effort normal and breath sounds normal. No respiratory distress.  Abdominal: Soft. There is no tenderness. There is no guarding.  Musculoskeletal: She exhibits no edema.  Neurological: She is alert and oriented to person, place, and time. No cranial nerve deficit. She exhibits normal muscle tone. Coordination normal.  Skin: Skin is warm and dry.  Psychiatric: She has a normal mood and affect.  Nursing note and vitals reviewed.    ED Treatments / Results  Labs (all labs ordered are listed, but only abnormal results are displayed) Labs Reviewed  COMPREHENSIVE METABOLIC PANEL - Abnormal; Notable for the following:       Result  Value  Potassium 3.3 (*)    Glucose, Bld 130 (*)    BUN 22 (*)    Creatinine, Ser 1.96 (*)    GFR calc non Af Amer 30 (*)    GFR calc Af Amer 35 (*)    All other components within normal limits  CBC - Abnormal; Notable for the following:    RDW 17.0 (*)    All other components within normal limits  C DIFFICILE QUICK SCREEN W PCR REFLEX  GASTROINTESTINAL PANEL BY PCR, STOOL (REPLACES STOOL CULTURE)  LIPASE, BLOOD  URINALYSIS, ROUTINE W REFLEX MICROSCOPIC  I-STAT BETA HCG BLOOD, ED ([facility], [facility], [facility] ONLY)    EKG  EKG Interpretation None       Radiology No results found.  Procedures Procedures (including critical care time)  Medications Ordered in ED Medications - No data to display   Initial Impression / Assessment and Plan / ED Course  I have reviewed the triage vital signs and the nursing notes.  Pertinent labs & imaging results that were available during my care of the patient were reviewed by me and considered in my medical decision making (see chart for details).     Final Clinical Impressions(s) / ED Diagnoses   Final diagnoses:  Diarrhea of presumed infectious origin  Dehydration  Patient has had 4 days of copious diarrhea. Labs show mild dehydration. She is alert and nontoxic. Abdominal examination is nonsurgical. Rehydrated in the emergency department. Cerner is for possible C. difficile with history of 2 courses of antibiotics last month. At this time, patient has not been able to provide stool specimen. She will attempt one more time. Patient instructions are provided for returning a specimen to the lab. He is to continue oral hydration. She is to contact her doctor for recheck within one to 2 days and follow-up on stool culture results. He is counseled signs and symptoms for which return.  New Prescriptions New Prescriptions   No medications on file     Charlesetta Shanks, MD 10/06/16 1654

## 2016-10-06 NOTE — ED Notes (Signed)
Per PCP-states has had diarrhea for 4 days-states low BP-PCP sending her here for fluids

## 2016-10-07 LAB — GASTROINTESTINAL PANEL BY PCR, STOOL (REPLACES STOOL CULTURE)
ASTROVIRUS: DETECTED — AB
Adenovirus F40/41: NOT DETECTED
CAMPYLOBACTER SPECIES: NOT DETECTED
CYCLOSPORA CAYETANENSIS: NOT DETECTED
Cryptosporidium: NOT DETECTED
ENTEROTOXIGENIC E COLI (ETEC): NOT DETECTED
Entamoeba histolytica: NOT DETECTED
Enteroaggregative E coli (EAEC): NOT DETECTED
Enteropathogenic E coli (EPEC): NOT DETECTED
Giardia lamblia: NOT DETECTED
Norovirus GI/GII: NOT DETECTED
PLESIMONAS SHIGELLOIDES: NOT DETECTED
Rotavirus A: NOT DETECTED
SAPOVIRUS (I, II, IV, AND V): NOT DETECTED
SHIGA LIKE TOXIN PRODUCING E COLI (STEC): NOT DETECTED
Salmonella species: NOT DETECTED
Shigella/Enteroinvasive E coli (EIEC): NOT DETECTED
Vibrio cholerae: NOT DETECTED
Vibrio species: NOT DETECTED
Yersinia enterocolitica: NOT DETECTED

## 2016-10-09 ENCOUNTER — Encounter: Payer: Self-pay | Admitting: Cardiology

## 2016-10-12 MED FILL — TRANEXAMIC ACID 650 MG TAB: 650 | 5 days supply | Qty: 30 | Fill #0

## 2016-10-14 ENCOUNTER — Encounter (HOSPITAL_COMMUNITY): Payer: Self-pay | Admitting: *Deleted

## 2016-10-26 MED FILL — AMLODIPINE BESYLATE 2.5 MG: 2.5 | 30 days supply | Qty: 30 | Fill #4

## 2016-10-26 MED FILL — ISOSORBIDE MN ER 60 MG TAB: 60 | 30 days supply | Qty: 30 | Fill #4

## 2016-10-26 MED FILL — ENTRESTO 49 MG-51 MG TABLET: 49-51 | 30 days supply | Qty: 60 | Fill #4

## 2016-10-26 MED FILL — hydrALAZINE HCL 50 MG TABS: 50 | 30 days supply | Qty: 135 | Fill #1

## 2016-10-26 MED FILL — SPIRONOLACTONE 25 MG TABLET: 25 | 30 days supply | Qty: 15 | Fill #3

## 2016-10-26 MED FILL — CARVEDILOL 25 MG TABLET: 25 | 30 days supply | Qty: 60 | Fill #2

## 2016-10-26 MED FILL — TORSEMIDE 20 MG TABLET: 20 | 30 days supply | Qty: 120 | Fill #3

## 2016-10-26 MED FILL — KLOR-CON M20 TABLET: 20 | 30 days supply | Qty: 60 | Fill #1

## 2016-10-28 NOTE — Progress Notes (Signed)
Please place orders in EPIC as patient has a Pre-op appointment on 11/18/2016 with the nurse! Thank you!

## 2016-11-12 ENCOUNTER — Telehealth: Payer: Self-pay | Admitting: Internal Medicine

## 2016-11-12 NOTE — Progress Notes (Signed)
Called patient and left message to call regarding ? Does she have another cardiologist following her since last device check was 10/2015 and visit was 12/20/14.   Patient called back to say the last time it was checked was 10/2015 and she had called cardiology office and the soonest they could see her was 11/30/2016.  I informed patient that her device would need to be evaluated prior to surgery.  I informed her that I would call [facility] and speak with Triage and see if they could cardiology to get earlier appt .  Called and spoke with Claiborne Billings in Triage at [facility] and told of above and she stated she would let DR Greer Pickerel and his assistant be aware.

## 2016-11-12 NOTE — Telephone Encounter (Signed)
Scheduled patient for device check 0900 on 4/20. Patient questioned why her home monitor wasn't working but was at work and unable to trouble shoot with me. Tech services number given for support when she gets home.

## 2016-11-12 NOTE — Patient Instructions (Addendum)
[name]  11/12/2016   Your procedure is scheduled on: 11-24-16  Report to [facility] Main  Entrance Take [place]  elevators to 3rd floor to  [place] at [phone].  Call this number if you have problems the morning of surgery [phone]    Remember: ONLY 1 PERSON MAY GO WITH YOU TO SHORT STAY TO GET  READY MORNING OF [place].  Do not eat food or drink liquids :After Midnight.     Take these medicines the morning of surgery with A SIP OF WATER: carvedilol(coreg), amlodipine(Norvasc), hydralazine(apresoline), cetirizine(zyrtec), tylenol as needed, inhalers as needed (may bring to hospital)   Bring CPAP mask and tubing to the hospital. Machine will be provided for you .                               You may not have any metal on your body including hair pins and              piercings  Do not wear jewelry, make-up, lotions, powders or perfumes, deodorant             Do not wear nail polish.  Do not shave  48 hours prior to surgery.               Do not bring valuables to the hospital. [place].  Contacts, dentures or bridgework may not be worn into surgery.  Leave suitcase in the car. After surgery it may be brought to your room.               Please read over the following fact sheets you were given: _____________________________________________________________________             [facility] - Preparing for Surgery Before surgery, you can play an important role.  Because skin is not sterile, your skin needs to be as free of germs as possible.  You can reduce the number of germs on your skin by washing with CHG (chlorahexidine gluconate) soap before surgery.  CHG is an antiseptic cleaner which kills germs and bonds with the skin to continue killing germs even after washing. Please DO NOT use if you have an allergy to CHG or antibacterial soaps.  If your skin becomes reddened/irritated stop using  the CHG and inform your nurse when you arrive at Short Stay. Do not shave (including legs and underarms) for at least 48 hours prior to the first CHG shower.  You may shave your face/neck. Please follow these instructions carefully:  1.  Shower with CHG Soap the night before surgery and the  morning of Surgery.  2.  If you choose to wash your hair, wash your hair first as usual with your  normal  shampoo.  3.  After you shampoo, rinse your hair and body thoroughly to remove the  shampoo.                           4.  Use CHG as you would any other liquid soap.  You can apply chg directly  to the skin and wash  Gently with a scrungie or clean washcloth.  5.  Apply the CHG Soap to your body ONLY FROM THE NECK DOWN.   Do not use on face/ open                           Wound or open sores. Avoid contact with eyes, ears mouth and genitals (private parts).                       Wash face,  Genitals (private parts) with your normal soap.             6.  Wash thoroughly, paying special attention to the area where your surgery  will be performed.  7.  Thoroughly rinse your body with warm water from the neck down.  8.  DO NOT shower/wash with your normal soap after using and rinsing off  the CHG Soap.                9.  Pat yourself dry with a clean towel.            10.  Wear clean pajamas.            11.  Place clean sheets on your bed the night of your first shower and do not  sleep with pets. Day of Surgery : Do not apply any lotions/deodorants the morning of surgery.  Please wear clean clothes to the hospital/surgery center.  FAILURE TO FOLLOW THESE INSTRUCTIONS MAY RESULT IN THE CANCELLATION OF YOUR SURGERY PATIENT SIGNATURE_________________________________  NURSE SIGNATURE__________________________________  ________________________________________________________________________    Adam Phenix  An incentive spirometer is a tool that can help keep your lungs  clear and active. This tool measures how well you are filling your lungs with each breath. Taking long deep breaths may help reverse or decrease the chance of developing breathing (pulmonary) problems (especially infection) following:  A long period of time when you are unable to move or be active. BEFORE THE PROCEDURE   If the spirometer includes an indicator to show your best effort, your nurse or respiratory therapist will set it to a desired goal.  If possible, sit up straight or lean slightly forward. Try not to slouch.  Hold the incentive spirometer in an upright position. INSTRUCTIONS FOR USE  1. Sit on the edge of your bed if possible, or sit up as far as you can in bed or on a chair. 2. Hold the incentive spirometer in an upright position. 3. Breathe out normally. 4. Place the mouthpiece in your mouth and seal your lips tightly around it. 5. Breathe in slowly and as deeply as possible, raising the piston or the ball toward the top of the column. 6. Hold your breath for 3-5 seconds or for as long as possible. Allow the piston or ball to fall to the bottom of the column. 7. Remove the mouthpiece from your mouth and breathe out normally. 8. Rest for a few seconds and repeat Steps 1 through 7 at least 10 times every 1-2 hours when you are awake. Take your time and take a few normal breaths between deep breaths. 9. The spirometer may include an indicator to show your best effort. Use the indicator as a goal to work toward during each repetition. 10. After each set of 10 deep breaths, practice coughing to be sure your lungs are clear. If you have an incision (the cut made at the time of  surgery), support your incision when coughing by placing a pillow or rolled up towels firmly against it. Once you are able to get out of bed, walk around indoors and cough well. You may stop using the incentive spirometer when instructed by your caregiver.  RISKS AND COMPLICATIONS  Take your time so you do  not get dizzy or light-headed.  If you are in pain, you may need to take or ask for pain medication before doing incentive spirometry. It is harder to take a deep breath if you are having pain. AFTER USE  Rest and breathe slowly and easily.  It can be helpful to keep track of a log of your progress. Your caregiver can provide you with a simple table to help with this. If you are using the spirometer at home, follow these instructions: [place] IF:   You are having difficultly using the spirometer.  You have trouble using the spirometer as often as instructed.  Your pain medication is not giving enough relief while using the spirometer.  You develop fever of 100.5 F (38.1 C) or higher. SEEK IMMEDIATE MEDICAL CARE IF:   You cough up bloody sputum that had not been present before.  You develop fever of 102 F (38.9 C) or greater.  You develop worsening pain at or near the incision site. MAKE SURE YOU:   Understand these instructions.  Will watch your condition.  Will get help right away if you are not doing well or get worse. Document Released: 11/23/2006 Document Revised: 10/05/2011 Document Reviewed: 01/24/2007 [facility] Patient Information 2014 [place], [place].   ________________________________________________________________________

## 2016-11-12 NOTE — Telephone Encounter (Signed)
Patient calling states that she is scheduled for a gastric sleeve on May 1st and would like to have her defib checked before the surgery. Patient would like to know if she can be worked in sooner than may 7th. Thanks.

## 2016-11-12 NOTE — Progress Notes (Signed)
Cardiology clearance Dr Haroldine Laws 12-11-15 epic EKG 09-04-16 epic ECHO 10-08-15 epic CXR 09-25-16 epic

## 2016-11-13 ENCOUNTER — Ambulatory Visit (INDEPENDENT_AMBULATORY_CARE_PROVIDER_SITE_OTHER): Payer: BLUE CROSS/BLUE SHIELD | Admitting: *Deleted

## 2016-11-13 ENCOUNTER — Ambulatory Visit: Payer: Self-pay | Admitting: General Surgery

## 2016-11-13 DIAGNOSIS — I43 Cardiomyopathy in diseases classified elsewhere: Secondary | ICD-10-CM

## 2016-11-13 DIAGNOSIS — I4729 Other ventricular tachycardia: Secondary | ICD-10-CM

## 2016-11-13 DIAGNOSIS — I472 Ventricular tachycardia: Secondary | ICD-10-CM

## 2016-11-13 DIAGNOSIS — I119 Hypertensive heart disease without heart failure: Secondary | ICD-10-CM

## 2016-11-13 DIAGNOSIS — I5042 Chronic combined systolic (congestive) and diastolic (congestive) heart failure: Secondary | ICD-10-CM | POA: Diagnosis not present

## 2016-11-13 LAB — CUP PACEART INCLINIC DEVICE CHECK
Battery Voltage: 3.02 V
Brady Statistic RV Percent Paced: 0.01 %
HIGH POWER IMPEDANCE MEASURED VALUE: 43 Ohm
HIGH POWER IMPEDANCE MEASURED VALUE: 53 Ohm
Lead Channel Impedance Value: 361 Ohm
Lead Channel Pacing Threshold Amplitude: 2 V
Lead Channel Sensing Intrinsic Amplitude: 16.875 mV
Lead Channel Setting Pacing Amplitude: 3.5 V
Lead Channel Setting Pacing Pulse Width: 0.7 ms
MDC IDC LEAD IMPLANT DT: 20070718
MDC IDC LEAD LOCATION: 753860
MDC IDC MSMT BATTERY REMAINING LONGEVITY: 124 mo
MDC IDC MSMT LEADCHNL RV IMPEDANCE VALUE: 304 Ohm
MDC IDC MSMT LEADCHNL RV PACING THRESHOLD PULSEWIDTH: 0.7 ms
MDC IDC PG IMPLANT DT: 20160215
MDC IDC SESS DTM: 20180420092722
MDC IDC SET LEADCHNL RV SENSING SENSITIVITY: 0.3 mV

## 2016-11-13 NOTE — Progress Notes (Signed)
ICD check in clinic. Normal device function. Threshold and sensing consistent with previous device measurements. Impedance trends stable over time. 9 beats NSVT noted March 2017. Histogram distribution appropriate for patient and level of activity. Optivol stable. No changes made this session. Device programmed at appropriate safety margins. Device programmed to optimize intrinsic conduction. Estimated longevity 10.3 years. Pt enrolled in remote follow-up. ROV with [facility] (overdue) 02/16/17, [facility] every 3 months.

## 2016-11-16 NOTE — Progress Notes (Signed)
Last  in office ICD device check 11-13-16 epic

## 2016-11-16 NOTE — H&P (Signed)
Briana Artist, MD  Greer Pickerel, MD        [name]   She is ok to proceed from HF standpoint.   Thanks -dan    Staff message from Dr Haroldine Laws on 11/13/16.

## 2016-11-18 ENCOUNTER — Encounter (HOSPITAL_COMMUNITY): Payer: Self-pay

## 2016-11-18 ENCOUNTER — Encounter (HOSPITAL_COMMUNITY)
Admission: RE | Admit: 2016-11-18 | Discharge: 2016-11-18 | Disposition: A | Discharge status: Home / Self Care | Payer: BLUE CROSS/BLUE SHIELD | Source: Ambulatory Visit | Attending: General Surgery | Admitting: General Surgery

## 2016-11-18 DIAGNOSIS — Z95810 Presence of automatic (implantable) cardiac defibrillator: Secondary | ICD-10-CM | POA: Diagnosis not present

## 2016-11-18 DIAGNOSIS — I5042 Chronic combined systolic (congestive) and diastolic (congestive) heart failure: Secondary | ICD-10-CM | POA: Diagnosis not present

## 2016-11-18 DIAGNOSIS — N83209 Unspecified ovarian cyst, unspecified side: Secondary | ICD-10-CM | POA: Diagnosis not present

## 2016-11-18 DIAGNOSIS — Z833 Family history of diabetes mellitus: Secondary | ICD-10-CM | POA: Diagnosis not present

## 2016-11-18 DIAGNOSIS — Z79899 Other long term (current) drug therapy: Secondary | ICD-10-CM | POA: Diagnosis not present

## 2016-11-18 DIAGNOSIS — Z8249 Family history of ischemic heart disease and other diseases of the circulatory system: Secondary | ICD-10-CM | POA: Diagnosis not present

## 2016-11-18 DIAGNOSIS — Z01812 Encounter for preprocedural laboratory examination: Secondary | ICD-10-CM | POA: Diagnosis not present

## 2016-11-18 DIAGNOSIS — Z01818 Encounter for other preprocedural examination: Secondary | ICD-10-CM | POA: Diagnosis not present

## 2016-11-18 DIAGNOSIS — Z6841 Body Mass Index (BMI) 40.0 and over, adult: Secondary | ICD-10-CM | POA: Diagnosis not present

## 2016-11-18 DIAGNOSIS — E669 Obesity, unspecified: Secondary | ICD-10-CM | POA: Diagnosis not present

## 2016-11-18 DIAGNOSIS — G4733 Obstructive sleep apnea (adult) (pediatric): Secondary | ICD-10-CM | POA: Diagnosis not present

## 2016-11-18 DIAGNOSIS — I428 Other cardiomyopathies: Secondary | ICD-10-CM | POA: Diagnosis not present

## 2016-11-18 DIAGNOSIS — I110 Hypertensive heart disease with heart failure: Secondary | ICD-10-CM | POA: Diagnosis not present

## 2016-11-18 DIAGNOSIS — J45909 Unspecified asthma, uncomplicated: Secondary | ICD-10-CM | POA: Diagnosis not present

## 2016-11-18 LAB — CBC WITH DIFFERENTIAL/PLATELET
BASOS ABS: 0 10*3/uL (ref 0.0–0.1)
BASOS PCT: 0 %
EOS ABS: 0.3 10*3/uL (ref 0.0–0.7)
Eosinophils Relative: 4 %
HEMATOCRIT: 37.1 % (ref 36.0–46.0)
HEMOGLOBIN: 11.7 g/dL — AB (ref 12.0–15.0)
Lymphocytes Relative: 34 %
Lymphs Abs: 2.4 10*3/uL (ref 0.7–4.0)
MCH: 25.7 pg — ABNORMAL LOW (ref 26.0–34.0)
MCHC: 31.5 g/dL (ref 30.0–36.0)
MCV: 81.5 fL (ref 78.0–100.0)
MONOS PCT: 5 %
Monocytes Absolute: 0.3 10*3/uL (ref 0.1–1.0)
NEUTROS ABS: 3.9 10*3/uL (ref 1.7–7.7)
NEUTROS PCT: 57 %
Platelets: 254 10*3/uL (ref 150–400)
RBC: 4.55 MIL/uL (ref 3.87–5.11)
RDW: 17.1 % — ABNORMAL HIGH (ref 11.5–15.5)
WBC: 7 10*3/uL (ref 4.0–10.5)

## 2016-11-18 LAB — COMPREHENSIVE METABOLIC PANEL
ALBUMIN: 3.7 g/dL (ref 3.5–5.0)
ALK PHOS: 44 U/L (ref 38–126)
ALT: 12 U/L — AB (ref 14–54)
ANION GAP: 11 (ref 5–15)
AST: 13 U/L — AB (ref 15–41)
BILIRUBIN TOTAL: 0.5 mg/dL (ref 0.3–1.2)
BUN: 32 mg/dL — ABNORMAL HIGH (ref 6–20)
CHLORIDE: 105 mmol/L (ref 101–111)
CO2: 27 mmol/L (ref 22–32)
Calcium: 8.6 mg/dL — ABNORMAL LOW (ref 8.9–10.3)
Creatinine, Ser: 1.1 mg/dL — ABNORMAL HIGH (ref 0.44–1.00)
GFR calc Af Amer: >60 mL/min (ref >60)
GFR calc non Af Amer: >60 mL/min (ref >60)
GLUCOSE: 110 mg/dL — AB (ref 65–99)
POTASSIUM: 3.8 mmol/L (ref 3.5–5.1)
SODIUM: 143 mmol/L (ref 135–145)
TOTAL PROTEIN: 7.6 g/dL (ref 6.5–8.1)

## 2016-11-18 NOTE — Progress Notes (Signed)
Original ICD document for physicians orders received was incomplete and did not contain the physicians signature at bottom. Document was refaxed on 11-16-16 to device clinic [phone]. Prior to calling patient back for pre-op today, RN checked for any received faxes from device clinic. There were none. Will continue to follow up to ensure this info is received and will likely refax ICD sheet if necessary .

## 2016-11-18 NOTE — Progress Notes (Signed)
CMP results routed via epic to Dr Ok Anis

## 2016-11-19 ENCOUNTER — Ambulatory Visit (HOSPITAL_COMMUNITY)
Admission: RE | Admit: 2016-11-19 | Discharge: 2016-11-19 | Disposition: A | Discharge status: Home / Self Care | Payer: BLUE CROSS/BLUE SHIELD | Source: Ambulatory Visit | Attending: Internal Medicine | Admitting: Internal Medicine

## 2016-11-19 ENCOUNTER — Encounter (HOSPITAL_COMMUNITY): Payer: Self-pay | Admitting: Internal Medicine

## 2016-11-19 VITALS — BP 120/84 | HR 80 | Wt 252.2 lb

## 2016-11-19 DIAGNOSIS — Z833 Family history of diabetes mellitus: Secondary | ICD-10-CM | POA: Insufficient documentation

## 2016-11-19 DIAGNOSIS — Z01818 Encounter for other preprocedural examination: Secondary | ICD-10-CM | POA: Diagnosis not present

## 2016-11-19 DIAGNOSIS — I428 Other cardiomyopathies: Secondary | ICD-10-CM | POA: Insufficient documentation

## 2016-11-19 DIAGNOSIS — G4733 Obstructive sleep apnea (adult) (pediatric): Secondary | ICD-10-CM

## 2016-11-19 DIAGNOSIS — Z95810 Presence of automatic (implantable) cardiac defibrillator: Secondary | ICD-10-CM | POA: Insufficient documentation

## 2016-11-19 DIAGNOSIS — I10 Essential (primary) hypertension: Secondary | ICD-10-CM

## 2016-11-19 DIAGNOSIS — J42 Unspecified chronic bronchitis: Secondary | ICD-10-CM | POA: Diagnosis not present

## 2016-11-19 DIAGNOSIS — Z01812 Encounter for preprocedural laboratory examination: Secondary | ICD-10-CM | POA: Insufficient documentation

## 2016-11-19 DIAGNOSIS — I5042 Chronic combined systolic (congestive) and diastolic (congestive) heart failure: Secondary | ICD-10-CM | POA: Diagnosis not present

## 2016-11-19 DIAGNOSIS — E669 Obesity, unspecified: Secondary | ICD-10-CM | POA: Insufficient documentation

## 2016-11-19 DIAGNOSIS — I110 Hypertensive heart disease with heart failure: Secondary | ICD-10-CM | POA: Insufficient documentation

## 2016-11-19 DIAGNOSIS — Z6841 Body Mass Index (BMI) 40.0 and over, adult: Secondary | ICD-10-CM | POA: Insufficient documentation

## 2016-11-19 DIAGNOSIS — Z8249 Family history of ischemic heart disease and other diseases of the circulatory system: Secondary | ICD-10-CM | POA: Insufficient documentation

## 2016-11-19 DIAGNOSIS — N83209 Unspecified ovarian cyst, unspecified side: Secondary | ICD-10-CM | POA: Insufficient documentation

## 2016-11-19 DIAGNOSIS — Z79899 Other long term (current) drug therapy: Secondary | ICD-10-CM | POA: Insufficient documentation

## 2016-11-19 DIAGNOSIS — J45909 Unspecified asthma, uncomplicated: Secondary | ICD-10-CM | POA: Insufficient documentation

## 2016-11-19 MED ORDER — TORSEMIDE 20 MG PO TABS: 40.0000 mg | ORAL_TABLET | Freq: Every day | ORAL | 6 refills | Status: DC

## 2016-11-19 NOTE — Patient Instructions (Signed)
Follow up in 6-8 weeks with Dr.Bensimhon

## 2016-11-19 NOTE — Progress Notes (Signed)
Patient ID: [name], female   DOB: October 18, 1975, 41 y.o.   MRN: [id number]    Advanced Heart Failure Clinic Note   PCP: Dr. Heath Gold  OB:  Dr. Leo Grosser  EP: Dr. Lovena Le CHF: Toria Monte  HPI: [name] is a 41 y.o. female with h/o obesity, chronic systolic heart failure due to nonischemic cardiomyopathy (cath 2007. No CAD) s/p [employer] ICD implantation, asthma/chronic bronchitis and severe hypertension.   ECHOs 04/27/2011 45-50%.   08/20/11 ECHO 55-60%   05/19/12 EF ~40%   07/01/12 EF ~40%   08/02/12 EF~40%    2/14 EF ~50%                          5/14 EF ~35%                          1/16 EF 25-30%                           3/17 EF 55-60% Grade I DD                          CPX 01/2014: Resting HR 75, Peak HR 121 Peak VO2 15.2, 74.4% predicted VE/VCO2 slope: 26.4 OUES: 1.93 Peak RER 1.15 VE/MVV 61.7%  Seen in ED 05/04/16 was having atypical chest pain and jaw pain. Thought to be mildly volume overloaded.   [name] presents today for regular follow up. At last visit Entresto increased. Has been feeling great. [name] is scheduled for Gastric sleeve resection and hiatal hernia repair on 11/24/2016. Has been in ED several times with flu, PNA, stomach virus, but otherwise doing great. Breathing has been good. [name] denies any DOE. 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 Completes next year vs 2020. Using compression stockings with improvement in her occasional peripheral edema. Has not been exercising much, but has plan in place for after surgery. Taking all medication as directed. Denies lightheadedness or dizziness. No orthopnea, bendopnea, PND, or palpitations.   Optivol: Fluid index below threshold and trending down. Thoracic impedence depressed but trending towards normal. [name] activity around 3 hours daily.   LABS: 01/03/14: K 4.2 Cr 1.27 pBNP 1766 (down from 4070)            02/13/14: K 4.4, creatinine 1.27            05/15/14: K 4.0, Creatinine 1.36  08/29/14: K 3.5, Creatinine  1.17  SH: Married. Lives in [place], 2 children. Working at [employer]. No ETOH or smoking  FH: Mother living: DM2, HTN, no CAD        Father living: DM2, HTN  Review of systems complete and found to be negative unless listed in HPI.    Past Medical History:  Diagnosis Date  . Acute bronchitis and bronchiolitis   . Anemia    Iron supplements in the past  . Asthma    Inhaler prn;triggered by seasonal changes mostly cold weathert;last asthma  attack years ago  . Atrial fibrillation ([place])   . CHF (congestive heart failure) ([place]) 01/2006   Has Pacemaker and Defibrillator;was on heart meds;no longer taking since pregancy  . CHF (congestive heart failure) ([place])   . Hypertension    currently on Labetalol 200mg  BID  . Hypertension   . Infection    UTI;not frequent;last infection was in June  . Infection  Yeast;not frequent  . Migraines    Can't take Procardia XL, causes migraines  . Other primary cardiomyopathies   . Paroxysmal ventricular tachycardia ([place])   . Varicose veins 1998   Developed during last pregnancy    Current Outpatient Prescriptions  Medication Sig Dispense Refill  . acetaminophen (TYLENOL) 500 MG tablet Take 1,000 mg by mouth every 6 (six) hours as needed for mild pain, moderate pain or headache.     . albuterol (PROVENTIL HFA;VENTOLIN HFA) 108 (90 Base) MCG/ACT inhaler Inhale 2 puffs into the lungs every 6 (six) hours as needed for wheezing or shortness of breath. Rescue inhaler 1 Inhaler 1  . amLODipine (NORVASC) 2.5 MG tablet TAKE 1 TABLET BY MOUTH DAILY. 30 tablet 6  . calcium carbonate (OSCAL) 1500 (600 Ca) MG TABS tablet Take 600 mg of elemental calcium by mouth 3 (three) times daily with meals.    . carvedilol (COREG) 25 MG tablet TAKE 1 TABLET BY MOUTH TWICE DAILY WITH MEALS 60 tablet 6  . cetirizine (ZYRTEC) 10 MG tablet Take 10 mg by mouth daily as needed for allergies.     . ferrous sulfate 325 (65 FE) MG tablet Take 1 tablet (325 mg total) by mouth  3 (three) times daily with meals. 90 tablet 3  . hydrALAZINE (APRESOLINE) 50 MG tablet TAKE 1 AND 1/2 TABLETS BY MOUTH 3 TIMES DAILY. 135 tablet 6  . ibuprofen (ADVIL,MOTRIN) 200 MG tablet Take 400 mg by mouth every 6 (six) hours as needed for headache, mild pain or moderate pain.     . isosorbide mononitrate (IMDUR) 60 MG 24 hr tablet TAKE 1 TABLET BY MOUTH DAILY. 30 tablet 6  . potassium chloride SA (K-DUR,KLOR-CON) 20 MEQ tablet Take 1 tablet (20 mEq total) by mouth 2 (two) times daily. 60 tablet 3  . ranitidine (ZANTAC) 150 MG tablet Take 150 mg by mouth 2 (two) times daily.    . sacubitril-valsartan (ENTRESTO) 49-51 MG Take 1 tablet by mouth 2 (two) times daily. 60 tablet 11  . spironolactone (ALDACTONE) 25 MG tablet TAKE 1/2 TABLET BY MOUTH DAILY. 15 tablet PRN  . torsemide (DEMADEX) 20 MG tablet TAKE 3 TABS BY MOUTH EVERY MORNING AND 1 TAB EVERY EVENING 120 tablet 6  . tranexamic acid (LYSTEDA) 650 MG TABS tablet Take 650 mg by mouth See admin instructions. Take 1 tablet three times a day for 3 days only at the start of menstrual cycle.  4   No current facility-administered medications for this encounter.     Vitals:   11/19/16 1535  BP: 120/84  Pulse: 80  SpO2: 99%  Weight: 252 lb 4 oz (114.4 kg)   Wt Readings from Last 3 Encounters:  11/19/16 252 lb 4 oz (114.4 kg)  11/18/16 251 lb 12.8 oz (114.2 kg)  09/25/16 253 lb 12.8 oz (115.1 kg)     PHYSICAL EXAM: General: Well appearing. No resp difficulty. HEENT: normal Neck: supple. JVD 5-6. Carotids 2+ bilat; no bruits. No thyromegaly or nodule noted. Cor: PMI nondisplaced. RRR. +S4. No M/G/R noted Lungs: CTAB, normal effort. Abdomen: soft, non-tender, distended, no HSM. No bruits or masses. +BS  Extremities: no cyanosis, clubbing, rash, Trace ankle edema.  Neuro: alert & orientedx3, cranial nerves grossly intact. moves all 4 extremities w/o difficulty. Affect pleasant   ASSESSMENT & PLAN:  1) Chronic combined  systolic/diastolic heart failure with recovered EF, NICM s/p ICD likely r/t HTN, last echo 1/16 EF 25-30%. Cath 8/16 EF 35% No CAD. Echo 3/17  EF 55-60%, Grade 1 DD - NYHA II.  - Volume status stable on exam apart from trace ankle edema. - Continue torsemide 60 mg q am and 20 mg q pm.  Can take extra 20 mg tonight for edema.  - We have instructed her to decrease her torsemide dose to 40 mg daily after surgery and call HF clininc with any questions.  - Continue hydralazine 75 mg TID and Imdur 60 mg daily - Continue spiro 12.5 mg daily.  - Continue Entresto 49/51 mg BID.  - Continue carvedilol 25 bid. 2) OSA  -  Continue CPAP. Will likely have decreased need once loses weight.  3) HTN - Well controlled on current regimen.  4) Asthma/chronic bronchitis - Stable. 5) Obesity: - [name] is scheduled for gastric sleeve 11/24/16 by Dr. Greer Pickerel with [facility].  - [name] is cleared from a cardiac perspective to proceed.   Follow up 6-8 weeks to check in after surgery and adjust medications as needed. Labs stable yesterday. No need for labs today.   [name], PA-C 3:38 PM   Patient seen and examined with the above-signed Advanced Practice Provider and/or Housestaff. I personally reviewed laboratory data, imaging studies and relevant notes. I independently examined the patient and formulated the important aspects of the plan. I have edited the note to reflect any of my changes or salient points. I have personally discussed the plan with the patient and/or family.   [name] is doing well. HF stable. EF has normalized. [name] is low-risk for peri-operative CV complications and can proceed with surgery without further work-up. Agree with cutting back diuretics post gastric sleeve with reduce po intake.  Will need repeat echo at next visit.   Glori Bickers, MD  4:32 PM

## 2016-11-19 NOTE — Progress Notes (Addendum)
Cardiology clearance [place] PA-C . Summary of office visit in epic 11-19-16   In addition , 2nd request faxed to device clinic as last documentation received was incomplete (see note in epic). Will continue to follow up as necessary

## 2016-11-20 ENCOUNTER — Telehealth (HOSPITAL_COMMUNITY): Payer: Self-pay | Admitting: *Deleted

## 2016-11-20 NOTE — Telephone Encounter (Signed)
Office note with Surgical clearance faxed to [facility] [place] [facility]

## 2016-11-20 NOTE — Progress Notes (Signed)
ICD orders received ; document placed in patient chart and indicated on front sheet

## 2016-11-22 NOTE — H&P (Signed)
41yo G2P2 who presents for gastric sleeve, hernia repair and ovarian cystectomy, possible oophorectomy.  Pt presented  for pelvic pain and heavy menstrual bleeding.  An [place] was performed on 10/12/2016 that showed: [place] today: 9cm anteverted uterus with 2 small fibroids- largest 2.4cm (both on left side). Right ovary with 2.9cm complex cyst- questionable hemorrhagic cyst- avascular. Left ovary with 2simple 2.2cm avasuclar cyst and adjacent to left ovary 5.2x4.5x4.4cm avascular cyst.  Due to upcoming GI procedure and the size of the largest ovarian cyst, plan was made to proceed with gynecologic diagnostic laparoscopy with cystectomy, possible left oophorectomy.    Current Medications  Taking   Amlodipine Besylate 2.5 MG Tablet 1 tablet Orally Once a day   Carvedilol 25 MG Tablet 1 tablet Orally twice a day   Entresto(Sacubitril-Valsartan) 49-51 MG Tablet 1 tablet Orally Twice a day   HydrALAZINE HCl 50 MG Tablet 1 1/2 tablet Orally three times a day   Isosorbide Mononitrate ER 60 MG Tablet Extended Release 24 Hour 1 tablet Orally Once a day   Spironolactone 25 MG Tablet 1/2 tablet Orally once a day   Ferrous Sulfate 324 (65 Fe) MG Tablet Delayed Release 1 tablet Orally three times a day   Zantac(RaNITidine HCl) 150 MG Tablet 1 tablet Orally twice a day   Potassium Chloride 20 MEQ Tablet Extended Release Orally Twice a day   Zyrtec Allergy(Cetirizine HCl) 10 MG Tablet 1 tablet Orally Once a day   Calcium 500 MG Tablet 1 tablet by mouth three times a day   Torsemide 20 MG Tablet 3 tablets in AM & 1 tablet in PM Orally as directed      Past Medical History  Asthma- Dr. Melvyn Novas.   HTN.   CHF-hypertensive cardiomyopathy- Dr. Tempie Hoist.   ICD- Dr. Lovena Le.   chronic IDA.   OSA- Dr. Radford Pax, [facility].   Hyperglycemia- 122 3/17.   eFGR 50-60s.    Surgical History  breast reduction 1995  c-section 1998, 2014  ICD Implant and then replaced 2007, 2016   Family History  Father: alive 71 yrs,  diagnosed with Diabetes, Hypertension  Mother: alive 6 yrs, diagnosed with Hypertension  Paternal Grand Mother: deceased, diagnosed with Ovarian cancer  Paternal aunt: alive, diagnosed with Breast cancer, Ovarian cancer  2daughter(s) - healthy.   only child, 2 paternal aunts with ovarian cancer, one deceased also had uterine and breast cancer.   Social History  General:  Tobacco use  cigarettes: Never smoked Tobacco history last updated 10/12/2016 EXPOSURE TO PASSIVE SMOKE: yes, in the past - mother.  no Alcohol.  Caffeine: yes, tea 4 x weekly, soda occasionally.  no Recreational drug use.  Exercise: yes, walk 45 mins 3 x weekly.  Marital Status: married.  Children: girls - 2.  EDUCATION: Some College.  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    Gyn History  Sexual activity currently sexually active.  Periods : every month .  Birth control BTL.  Last pap smear date 10/22/15 - WNL.  Last mammogram date N/A.  Abnormal pap smear none.  Menarche 26.    OB History  Number of pregnancies G2P2.  Pregnancy # 1 live birth, C-section delivery.  Pregnancy # 2 live birth, C-section.    Allergies  Percocet: closes airway: Allergy   Hospitalization/Major Diagnostic Procedure  CHF 2015  CHF 2016  CHF- hospitalized about 6 times in last 9 years    Review of Systems  CONSTITUTIONAL:  no Chills. no Fever. no Night sweats.  HEENT:  Blurrred vision no. no Double vision.  CARDIOLOGY:  no Chest pain.  RESPIRATORY:  no Shortness of breath. no Cough.  UROLOGY:  no Urinary urgency. Urinary frequency yes. no Urinary incontinence.  GASTROENTEROLOGY:  no Appetite change. no Change in bowel movements. no Nausea. no Vomiting.  FEMALE REPRODUCTIVE:  no Breast lumps or discharge. no Breast pain. no Dyspareunia. no Unusual vaginal discharge. no Vaginal irritation. no Vaginal itching.  NEUROLOGY:  no Dizziness. no Headache. no Loss of consciousness.  PSYCHOLOGY:  no  Anxiety. no Depression.  SKIN:  no Rash. no Hives.  HEMATOLOGY/LYMPH:  Anemia yes. Using Blood Thinners no.     O:  General Examination: GENERAL APPEARANCE well developed, well nourished .  SKIN: warm and dry, no rashes .  NECK: supple, normal appearance .  LUNGS: regular breathing rate and effort .  ABDOMEN: morbidly obese.  EXTREMITIES: no edema present .  PSYCH: appropriate mood and affect  A/P: 41yo G2P2 who presents for gastric sleeve, hernia repair and laparoscopic ovarian cystectomy, possible oophorectomy. -From a GYN perspective, risk,benefit and indications have been reviewed with patient.  Questions and concerns were addressed and pt agrees to proceed. -NPO -LR @ 125cc/hr -SCDs to OR -Antibiotics per general surgery recommendations  Janyth Pupa, DO [phone] (pager) [phone] (office)

## 2016-11-24 ENCOUNTER — Encounter (HOSPITAL_COMMUNITY): Payer: Self-pay | Admitting: *Deleted

## 2016-11-24 ENCOUNTER — Inpatient Hospital Stay (HOSPITAL_COMMUNITY)
Admission: RE | Admit: 2016-11-24 | Discharge: 2016-11-26 | DRG: 620 | Disposition: A | Discharge status: Home / Self Care | Payer: BLUE CROSS/BLUE SHIELD | Source: Ambulatory Visit | Attending: General Surgery | Admitting: General Surgery

## 2016-11-24 ENCOUNTER — Inpatient Hospital Stay (HOSPITAL_COMMUNITY): Payer: BLUE CROSS/BLUE SHIELD | Admitting: Certified Registered Nurse Anesthetist

## 2016-11-24 ENCOUNTER — Encounter (HOSPITAL_COMMUNITY)
Admission: RE | Disposition: A | Discharge status: Home / Self Care | Payer: Self-pay | Source: Ambulatory Visit | Attending: General Surgery

## 2016-11-24 DIAGNOSIS — Z886 Allergy status to analgesic agent status: Secondary | ICD-10-CM

## 2016-11-24 DIAGNOSIS — N83201 Unspecified ovarian cyst, right side: Secondary | ICD-10-CM | POA: Diagnosis present

## 2016-11-24 DIAGNOSIS — K219 Gastro-esophageal reflux disease without esophagitis: Secondary | ICD-10-CM | POA: Diagnosis present

## 2016-11-24 DIAGNOSIS — G4733 Obstructive sleep apnea (adult) (pediatric): Secondary | ICD-10-CM | POA: Diagnosis present

## 2016-11-24 DIAGNOSIS — Z95810 Presence of automatic (implantable) cardiac defibrillator: Secondary | ICD-10-CM | POA: Diagnosis present

## 2016-11-24 DIAGNOSIS — Z6841 Body Mass Index (BMI) 40.0 and over, adult: Secondary | ICD-10-CM

## 2016-11-24 DIAGNOSIS — R7303 Prediabetes: Secondary | ICD-10-CM | POA: Diagnosis present

## 2016-11-24 DIAGNOSIS — Z8249 Family history of ischemic heart disease and other diseases of the circulatory system: Secondary | ICD-10-CM | POA: Diagnosis not present

## 2016-11-24 DIAGNOSIS — K760 Fatty (change of) liver, not elsewhere classified: Secondary | ICD-10-CM | POA: Diagnosis present

## 2016-11-24 DIAGNOSIS — Z7722 Contact with and (suspected) exposure to environmental tobacco smoke (acute) (chronic): Secondary | ICD-10-CM | POA: Diagnosis present

## 2016-11-24 DIAGNOSIS — I5042 Chronic combined systolic (congestive) and diastolic (congestive) heart failure: Secondary | ICD-10-CM | POA: Diagnosis present

## 2016-11-24 DIAGNOSIS — I43 Cardiomyopathy in diseases classified elsewhere: Secondary | ICD-10-CM | POA: Diagnosis present

## 2016-11-24 DIAGNOSIS — R739 Hyperglycemia, unspecified: Secondary | ICD-10-CM | POA: Diagnosis present

## 2016-11-24 DIAGNOSIS — K449 Diaphragmatic hernia without obstruction or gangrene: Secondary | ICD-10-CM | POA: Diagnosis present

## 2016-11-24 DIAGNOSIS — D259 Leiomyoma of uterus, unspecified: Secondary | ICD-10-CM | POA: Diagnosis present

## 2016-11-24 DIAGNOSIS — Z9989 Dependence on other enabling machines and devices: Secondary | ICD-10-CM

## 2016-11-24 DIAGNOSIS — K429 Umbilical hernia without obstruction or gangrene: Secondary | ICD-10-CM | POA: Diagnosis present

## 2016-11-24 DIAGNOSIS — I10 Essential (primary) hypertension: Secondary | ICD-10-CM | POA: Diagnosis present

## 2016-11-24 DIAGNOSIS — D509 Iron deficiency anemia, unspecified: Secondary | ICD-10-CM | POA: Diagnosis present

## 2016-11-24 DIAGNOSIS — I110 Hypertensive heart disease with heart failure: Secondary | ICD-10-CM | POA: Diagnosis present

## 2016-11-24 DIAGNOSIS — Z79899 Other long term (current) drug therapy: Secondary | ICD-10-CM

## 2016-11-24 DIAGNOSIS — D638 Anemia in other chronic diseases classified elsewhere: Secondary | ICD-10-CM | POA: Diagnosis present

## 2016-11-24 DIAGNOSIS — M25561 Pain in right knee: Secondary | ICD-10-CM | POA: Diagnosis present

## 2016-11-24 DIAGNOSIS — Z9884 Bariatric surgery status: Secondary | ICD-10-CM

## 2016-11-24 DIAGNOSIS — J45991 Cough variant asthma: Secondary | ICD-10-CM | POA: Diagnosis present

## 2016-11-24 DIAGNOSIS — Z885 Allergy status to narcotic agent status: Secondary | ICD-10-CM | POA: Diagnosis not present

## 2016-11-24 DIAGNOSIS — M25562 Pain in left knee: Secondary | ICD-10-CM | POA: Diagnosis present

## 2016-11-24 HISTORY — PX: LAPAROSCOPIC GASTRIC SLEEVE RESECTION WITH HIATAL HERNIA REPAIR: SHX6512

## 2016-11-24 HISTORY — PX: LAPAROSCOPIC OVARIAN CYSTECTOMY: SHX6248

## 2016-11-24 LAB — HEMOGLOBIN AND HEMATOCRIT, BLOOD
HEMATOCRIT: 39 % (ref 36.0–46.0)
HEMOGLOBIN: 12.4 g/dL (ref 12.0–15.0)

## 2016-11-24 LAB — PREGNANCY, URINE: Preg Test, Ur: NEGATIVE

## 2016-11-24 SURGERY — GASTRECTOMY, SLEEVE, LAPAROSCOPIC, WITH HIATAL HERNIA REPAIR
Anesthesia: General | Site: Abdomen

## 2016-11-24 MED ORDER — MIDAZOLAM HCL 2 MG/2ML IJ SOLN
INTRAMUSCULAR | Status: AC
  Filled 2016-11-24: qty 2

## 2016-11-24 MED ORDER — ALBUTEROL SULFATE HFA 108 (90 BASE) MCG/ACT IN AERS
INHALATION_SPRAY | RESPIRATORY_TRACT | Status: DC | PRN
  Administered 2016-11-24: 5 via RESPIRATORY_TRACT

## 2016-11-24 MED ORDER — CEFOTETAN DISODIUM-DEXTROSE 2-2.08 GM-% IV SOLR
INTRAVENOUS | Status: AC
  Filled 2016-11-24: qty 50

## 2016-11-24 MED ORDER — ENOXAPARIN SODIUM 30 MG/0.3ML ~~LOC~~ SOLN
30.0000 mg | Freq: Two times a day (BID) | SUBCUTANEOUS | Status: DC
  Administered 2016-11-25 – 2016-11-26 (×2): 30 mg via SUBCUTANEOUS
  Filled 2016-11-24 (×2): qty 0.3

## 2016-11-24 MED ORDER — SODIUM CHLORIDE 0.9 % IJ SOLN
INTRAMUSCULAR | Status: AC
  Filled 2016-11-24: qty 50

## 2016-11-24 MED ORDER — HYDROMORPHONE HCL 1 MG/ML IJ SOLN
0.2500 mg | INTRAMUSCULAR | Status: DC | PRN
  Administered 2016-11-24: 0.5 mg via INTRAVENOUS

## 2016-11-24 MED ORDER — ROCURONIUM BROMIDE 50 MG/5ML IV SOSY
PREFILLED_SYRINGE | INTRAVENOUS | Status: AC
  Filled 2016-11-24: qty 5

## 2016-11-24 MED ORDER — FENTANYL CITRATE (PF) 100 MCG/2ML IJ SOLN
25.0000 ug | INTRAMUSCULAR | Status: DC | PRN
  Administered 2016-11-24: 25 ug via INTRAVENOUS
  Filled 2016-11-24: qty 2

## 2016-11-24 MED ORDER — DIPHENHYDRAMINE HCL 50 MG/ML IJ SOLN: 12.5000 mg | Freq: Three times a day (TID) | INTRAMUSCULAR | Status: DC | PRN

## 2016-11-24 MED ORDER — STERILE WATER FOR IRRIGATION IR SOLN
Status: DC | PRN
  Administered 2016-11-24: 1000 mL

## 2016-11-24 MED ORDER — SUCCINYLCHOLINE CHLORIDE 200 MG/10ML IV SOSY
PREFILLED_SYRINGE | INTRAVENOUS | Status: DC | PRN
  Administered 2016-11-24: 120 mg via INTRAVENOUS

## 2016-11-24 MED ORDER — EPHEDRINE 5 MG/ML INJ
INTRAVENOUS | Status: AC
  Filled 2016-11-24: qty 10

## 2016-11-24 MED ORDER — LACTATED RINGERS IV SOLN
INTRAVENOUS | Status: DC | PRN
Start: 2016-11-24 — End: 2016-11-24
  Administered 2016-11-24: 07:00:00 via INTRAVENOUS

## 2016-11-24 MED ORDER — CHLORHEXIDINE GLUCONATE 4 % EX LIQD: 60.0000 mL | Freq: Once | CUTANEOUS | Status: DC

## 2016-11-24 MED ORDER — EVICEL 5 ML EX KIT
PACK | Freq: Once | CUTANEOUS | Status: DC
  Filled 2016-11-24: qty 1

## 2016-11-24 MED ORDER — SUGAMMADEX SODIUM 500 MG/5ML IV SOLN
INTRAVENOUS | Status: AC
  Filled 2016-11-24: qty 5

## 2016-11-24 MED ORDER — ALBUTEROL SULFATE HFA 108 (90 BASE) MCG/ACT IN AERS: 2.0000 | INHALATION_SPRAY | Freq: Four times a day (QID) | RESPIRATORY_TRACT | Status: DC | PRN

## 2016-11-24 MED ORDER — PROPOFOL 10 MG/ML IV BOLUS
INTRAVENOUS | Status: AC
  Filled 2016-11-24: qty 40

## 2016-11-24 MED ORDER — ACETAMINOPHEN 500 MG PO TABS
1000.0000 mg | ORAL_TABLET | ORAL | Status: AC
Start: 2016-11-24 — End: 2016-11-24
  Administered 2016-11-24: 1000 mg via ORAL
  Filled 2016-11-24: qty 2

## 2016-11-24 MED ORDER — HYDROMORPHONE HCL 1 MG/ML IJ SOLN
INTRAMUSCULAR | Status: AC
  Filled 2016-11-24: qty 1

## 2016-11-24 MED ORDER — FENTANYL CITRATE (PF) 250 MCG/5ML IJ SOLN
INTRAMUSCULAR | Status: AC
  Filled 2016-11-24: qty 5

## 2016-11-24 MED ORDER — LIDOCAINE 2% (20 MG/ML) 5 ML SYRINGE
INTRAMUSCULAR | Status: AC
  Filled 2016-11-24: qty 25

## 2016-11-24 MED ORDER — BUPIVACAINE LIPOSOME 1.3 % IJ SUSP
20.0000 mL | Freq: Once | INTRAMUSCULAR | Status: AC
  Administered 2016-11-24: 20 mL
  Filled 2016-11-24: qty 20

## 2016-11-24 MED ORDER — PREMIER PROTEIN SHAKE
2.0000 [oz_av] | ORAL | Status: DC
  Administered 2016-11-26 (×4): 2 [oz_av] via ORAL
  Filled 2016-11-24 (×6): qty 325.31

## 2016-11-24 MED ORDER — ACETAMINOPHEN 325 MG PO TABS: 650.0000 mg | ORAL_TABLET | ORAL | Status: DC | PRN

## 2016-11-24 MED ORDER — ONDANSETRON HCL 4 MG/2ML IJ SOLN
INTRAMUSCULAR | Status: DC | PRN
  Administered 2016-11-24: 4 mg via INTRAVENOUS

## 2016-11-24 MED ORDER — FENTANYL CITRATE (PF) 100 MCG/2ML IJ SOLN
INTRAMUSCULAR | Status: DC | PRN
  Administered 2016-11-24 (×5): 50 ug via INTRAVENOUS

## 2016-11-24 MED ORDER — HEPARIN SODIUM (PORCINE) 5000 UNIT/ML IJ SOLN
5000.0000 [IU] | INTRAMUSCULAR | Status: AC
  Administered 2016-11-24: 5000 [IU] via SUBCUTANEOUS
  Filled 2016-11-24: qty 1

## 2016-11-24 MED ORDER — SUCCINYLCHOLINE CHLORIDE 200 MG/10ML IV SOSY
PREFILLED_SYRINGE | INTRAVENOUS | Status: AC
  Filled 2016-11-24: qty 10

## 2016-11-24 MED ORDER — PNEUMOCOCCAL VAC POLYVALENT 25 MCG/0.5ML IJ INJ
0.5000 mL | INJECTION | INTRAMUSCULAR | Status: DC
  Filled 2016-11-24: qty 0.5

## 2016-11-24 MED ORDER — METOPROLOL TARTRATE 5 MG/5ML IV SOLN
5.0000 mg | Freq: Four times a day (QID) | INTRAVENOUS | Status: DC
  Administered 2016-11-24 – 2016-11-25 (×3): 5 mg via INTRAVENOUS
  Filled 2016-11-24 (×3): qty 5

## 2016-11-24 MED ORDER — LIDOCAINE 2% (20 MG/ML) 5 ML SYRINGE
INTRAMUSCULAR | Status: DC | PRN
  Administered 2016-11-24: 1.5 mg/kg/h via INTRAVENOUS

## 2016-11-24 MED ORDER — ROCURONIUM BROMIDE 50 MG/5ML IV SOSY
PREFILLED_SYRINGE | INTRAVENOUS | Status: DC | PRN
  Administered 2016-11-24: 10 mg via INTRAVENOUS
  Administered 2016-11-24 (×2): 20 mg via INTRAVENOUS
  Administered 2016-11-24: 50 mg via INTRAVENOUS

## 2016-11-24 MED ORDER — DEXAMETHASONE SODIUM PHOSPHATE 10 MG/ML IJ SOLN
INTRAMUSCULAR | Status: AC
  Filled 2016-11-24: qty 1

## 2016-11-24 MED ORDER — ONDANSETRON HCL 4 MG/2ML IJ SOLN
INTRAMUSCULAR | Status: AC
  Filled 2016-11-24: qty 2

## 2016-11-24 MED ORDER — CEFOTETAN DISODIUM-DEXTROSE 2-2.08 GM-% IV SOLR
2.0000 g | INTRAVENOUS | Status: AC
  Administered 2016-11-24: 2 g via INTRAVENOUS

## 2016-11-24 MED ORDER — SUGAMMADEX SODIUM 200 MG/2ML IV SOLN
INTRAVENOUS | Status: DC | PRN
  Administered 2016-11-24: 300 mg via INTRAVENOUS

## 2016-11-24 MED ORDER — ALBUTEROL SULFATE HFA 108 (90 BASE) MCG/ACT IN AERS
INHALATION_SPRAY | RESPIRATORY_TRACT | Status: AC
  Filled 2016-11-24: qty 6.7

## 2016-11-24 MED ORDER — PROPOFOL 10 MG/ML IV BOLUS
INTRAVENOUS | Status: DC | PRN
  Administered 2016-11-24: 200 mg via INTRAVENOUS

## 2016-11-24 MED ORDER — LIDOCAINE 2% (20 MG/ML) 5 ML SYRINGE
INTRAMUSCULAR | Status: DC | PRN
  Administered 2016-11-24: 100 mg via INTRAVENOUS

## 2016-11-24 MED ORDER — SODIUM CHLORIDE 0.9 % IJ SOLN
INTRAMUSCULAR | Status: DC | PRN
  Administered 2016-11-24: 50 mL

## 2016-11-24 MED ORDER — GABAPENTIN 300 MG PO CAPS
300.0000 mg | ORAL_CAPSULE | ORAL | Status: AC
Start: 2016-11-24 — End: 2016-11-24
  Administered 2016-11-24: 300 mg via ORAL
  Filled 2016-11-24: qty 1

## 2016-11-24 MED ORDER — EPHEDRINE SULFATE 50 MG/ML IJ SOLN
INTRAMUSCULAR | Status: DC | PRN
  Administered 2016-11-24 (×2): 10 mg via INTRAVENOUS

## 2016-11-24 MED ORDER — KCL IN DEXTROSE-NACL 20-5-0.45 MEQ/L-%-% IV SOLN
INTRAVENOUS | Status: DC
  Administered 2016-11-24: 125 mL/h via INTRAVENOUS
  Administered 2016-11-25 (×3): via INTRAVENOUS
  Filled 2016-11-24 (×6): qty 1000

## 2016-11-24 MED ORDER — CHLORHEXIDINE GLUCONATE 4 % EX LIQD
60.0000 mL | Freq: Once | CUTANEOUS | Status: DC
Start: 2016-11-25 — End: 2016-11-24

## 2016-11-24 MED ORDER — PHENYLEPHRINE HCL 10 MG/ML IJ SOLN
INTRAMUSCULAR | Status: DC | PRN
  Administered 2016-11-24: 80 ug via INTRAVENOUS

## 2016-11-24 MED ORDER — ONDANSETRON HCL 4 MG/2ML IJ SOLN: 4.0000 mg | Freq: Four times a day (QID) | INTRAMUSCULAR | Status: DC | PRN

## 2016-11-24 MED ORDER — PANTOPRAZOLE SODIUM 40 MG IV SOLR
40.0000 mg | Freq: Every day | INTRAVENOUS | Status: DC
  Administered 2016-11-24 – 2016-11-25 (×2): 40 mg via INTRAVENOUS
  Filled 2016-11-24 (×2): qty 40

## 2016-11-24 MED ORDER — APREPITANT 40 MG PO CAPS
40.0000 mg | ORAL_CAPSULE | ORAL | Status: AC
  Administered 2016-11-24: 40 mg via ORAL
  Filled 2016-11-24: qty 1

## 2016-11-24 MED ORDER — 0.9 % SODIUM CHLORIDE (POUR BTL) OPTIME
TOPICAL | Status: DC | PRN
  Administered 2016-11-24: 1000 mL

## 2016-11-24 MED ORDER — PHENYLEPHRINE 40 MCG/ML (10ML) SYRINGE FOR IV PUSH (FOR BLOOD PRESSURE SUPPORT)
PREFILLED_SYRINGE | INTRAVENOUS | Status: AC
  Filled 2016-11-24: qty 10

## 2016-11-24 MED ORDER — MIDAZOLAM HCL 5 MG/5ML IJ SOLN
INTRAMUSCULAR | Status: DC | PRN
  Administered 2016-11-24: 2 mg via INTRAVENOUS

## 2016-11-24 MED ORDER — ACETAMINOPHEN 160 MG/5ML PO SOLN: 325.0000 mg | ORAL | Status: DC | PRN

## 2016-11-24 MED ORDER — ONDANSETRON HCL 4 MG/2ML IJ SOLN
4.0000 mg | Freq: Four times a day (QID) | INTRAMUSCULAR | Status: DC | PRN
  Administered 2016-11-25: 4 mg via INTRAVENOUS
  Filled 2016-11-24: qty 2

## 2016-11-24 MED ORDER — ALBUTEROL SULFATE (2.5 MG/3ML) 0.083% IN NEBU: 2.5000 mg | INHALATION_SOLUTION | Freq: Four times a day (QID) | RESPIRATORY_TRACT | Status: DC | PRN

## 2016-11-24 MED ORDER — LACTATED RINGERS IR SOLN
Status: DC | PRN
  Administered 2016-11-24: 1000 mL

## 2016-11-24 MED ORDER — DEXAMETHASONE SODIUM PHOSPHATE 10 MG/ML IJ SOLN
4.0000 mg | INTRAMUSCULAR | Status: AC
  Administered 2016-11-24: 4 mg via INTRAVENOUS

## 2016-11-24 MED ORDER — LIDOCAINE 2% (20 MG/ML) 5 ML SYRINGE
INTRAMUSCULAR | Status: AC
  Filled 2016-11-24: qty 5

## 2016-11-24 MED ORDER — LIDOCAINE 2% (20 MG/ML) 5 ML SYRINGE: INTRAMUSCULAR | Status: DC | PRN

## 2016-11-24 MED ORDER — ENALAPRILAT 1.25 MG/ML IV SOLN
1.2500 mg | Freq: Four times a day (QID) | INTRAVENOUS | Status: DC | PRN
  Filled 2016-11-24: qty 1

## 2016-11-24 MED ORDER — OXYCODONE HCL 5 MG/5ML PO SOLN
5.0000 mg | ORAL | Status: DC | PRN
  Administered 2016-11-25: 5 mg via ORAL
  Filled 2016-11-24: qty 5

## 2016-11-24 MED ORDER — SCOPOLAMINE 1 MG/3DAYS TD PT72
1.0000 | MEDICATED_PATCH | TRANSDERMAL | Status: DC
  Administered 2016-11-24: 1.5 mg via TRANSDERMAL
  Filled 2016-11-24: qty 1

## 2016-11-24 MED ORDER — PROMETHAZINE HCL 25 MG/ML IJ SOLN: 12.5000 mg | Freq: Four times a day (QID) | INTRAMUSCULAR | Status: DC | PRN

## 2016-11-24 MED ORDER — LIDOCAINE 2% (20 MG/ML) 5 ML SYRINGE
INTRAMUSCULAR | Status: AC
  Filled 2016-11-24: qty 15

## 2016-11-24 SURGICAL SUPPLY — 69 items
ADH SKN CLS APL DERMABOND .7 (GAUZE/BANDAGES/DRESSINGS)
APL SKNCLS STERI-STRIP NONHPOA (GAUZE/BANDAGES/DRESSINGS) ×2
APL SRG 32X5 SNPLK LF DISP (MISCELLANEOUS)
APPLICATOR COTTON TIP 6IN STRL (MISCELLANEOUS) IMPLANT
APPLIER CLIP ROT 10 11.4 M/L (STAPLE)
APPLIER CLIP ROT 13.4 12 LRG (CLIP) ×3
APR CLP LRG 13.4X12 ROT 20 MLT (CLIP) ×2
APR CLP MED LRG 11.4X10 (STAPLE)
BANDAGE ADH SHEER 1  50/CT (GAUZE/BANDAGES/DRESSINGS) ×3 IMPLANT
BENZOIN TINCTURE PRP APPL 2/3 (GAUZE/BANDAGES/DRESSINGS) ×3 IMPLANT
BLADE SURG SZ11 CARB STEEL (BLADE) ×3 IMPLANT
CABLE HIGH FREQUENCY MONO STRZ (ELECTRODE) IMPLANT
CHLORAPREP W/TINT 26ML (MISCELLANEOUS) ×6 IMPLANT
CLIP APPLIE ROT 10 11.4 M/L (STAPLE) IMPLANT
CLIP APPLIE ROT 13.4 12 LRG (CLIP) ×2 IMPLANT
DECANTER SPIKE VIAL GLASS SM (MISCELLANEOUS) ×3 IMPLANT
DERMABOND ADVANCED (GAUZE/BANDAGES/DRESSINGS)
DERMABOND ADVANCED .7 DNX12 (GAUZE/BANDAGES/DRESSINGS) IMPLANT
DEVICE SUT QUICK LOAD TK 5 (STAPLE) IMPLANT
DEVICE SUT TI-KNOT TK 5X26 (MISCELLANEOUS) IMPLANT
DEVICE SUTURE ENDOST 10MM (ENDOMECHANICALS) IMPLANT
DISSECTOR BLUNT TIP ENDO 5MM (MISCELLANEOUS) ×3 IMPLANT
DRAPE UTILITY XL STRL (DRAPES) ×6 IMPLANT
ELECT L-HOOK LAP 45CM DISP (ELECTROSURGICAL)
ELECT PENCIL ROCKER SW 15FT (MISCELLANEOUS) IMPLANT
ELECT REM PT RETURN 15FT ADLT (MISCELLANEOUS) ×3 IMPLANT
ELECTRODE L-HOOK LAP 45CM DISP (ELECTROSURGICAL) IMPLANT
GAUZE SPONGE 2X2 8PLY STRL LF (GAUZE/BANDAGES/DRESSINGS) IMPLANT
GAUZE SPONGE 4X4 12PLY STRL (GAUZE/BANDAGES/DRESSINGS) IMPLANT
GLOVE BIO SURGEON STRL SZ7.5 (GLOVE) ×3 IMPLANT
GLOVE INDICATOR 8.0 STRL GRN (GLOVE) ×3 IMPLANT
GOWN STRL REUS W/TWL XL LVL3 (GOWN DISPOSABLE) ×9 IMPLANT
GRASPER SUT TROCAR 14GX15 (MISCELLANEOUS) IMPLANT
HOVERMATT SINGLE USE (MISCELLANEOUS) ×3 IMPLANT
KIT BASIN OR (CUSTOM PROCEDURE TRAY) ×3 IMPLANT
MARKER SKIN DUAL TIP RULER LAB (MISCELLANEOUS) ×3 IMPLANT
NEEDLE SPNL 22GX3.5 QUINCKE BK (NEEDLE) ×3 IMPLANT
PACK UNIVERSAL I (CUSTOM PROCEDURE TRAY) ×3 IMPLANT
RELOAD STAPLER BLUE 60MM (STAPLE) ×4 IMPLANT
RELOAD STAPLER GOLD 60MM (STAPLE) ×2 IMPLANT
RELOAD STAPLER GREEN 60MM (STAPLE) ×6 IMPLANT
SCISSORS LAP 5X45 EPIX DISP (ENDOMECHANICALS) ×3 IMPLANT
SEALANT SURGICAL APPL DUAL CAN (MISCELLANEOUS) IMPLANT
SHEARS HARMONIC ACE PLUS 45CM (MISCELLANEOUS) ×3 IMPLANT
SLEEVE ADV FIXATION 5X100MM (TROCAR) ×6 IMPLANT
SLEEVE GASTRECTOMY 40FR VISIGI (MISCELLANEOUS) ×3 IMPLANT
SOLUTION ANTI FOG 6CC (MISCELLANEOUS) ×3 IMPLANT
SPONGE GAUZE 2X2 STER 10/PKG (GAUZE/BANDAGES/DRESSINGS)
SPONGE LAP 18X18 X RAY DECT (DISPOSABLE) ×3 IMPLANT
STAPLER ECHELON BIOABSB 60 FLE (MISCELLANEOUS) ×15 IMPLANT
STAPLER ECHELON LONG 60 440 (INSTRUMENTS) ×3 IMPLANT
STAPLER RELOAD BLUE 60MM (STAPLE) ×6
STAPLER RELOAD GOLD 60MM (STAPLE) ×3
STAPLER RELOAD GREEN 60MM (STAPLE) ×9
STRIP CLOSURE SKIN 1/2X4 (GAUZE/BANDAGES/DRESSINGS) ×3 IMPLANT
SUT MNCRL AB 4-0 PS2 18 (SUTURE) ×3 IMPLANT
SUT SURGIDAC NAB ES-9 0 48 120 (SUTURE) IMPLANT
SUT VICRYL 0 TIES 12 18 (SUTURE) ×3 IMPLANT
SYR 10ML ECCENTRIC (SYRINGE) ×3 IMPLANT
SYR 20CC LL (SYRINGE) ×3 IMPLANT
SYR 50ML LL SCALE MARK (SYRINGE) ×3 IMPLANT
TOWEL OR 17X26 10 PK STRL BLUE (TOWEL DISPOSABLE) ×3 IMPLANT
TOWEL OR NON WOVEN STRL DISP B (DISPOSABLE) ×3 IMPLANT
TROCAR ADV FIXATION 5X100MM (TROCAR) ×3 IMPLANT
TROCAR BLADELESS 15MM (ENDOMECHANICALS) ×3 IMPLANT
TROCAR BLADELESS OPT 5 100 (ENDOMECHANICALS) ×3 IMPLANT
TUBING CONNECTING 10 (TUBING) ×3 IMPLANT
TUBING ENDO SMARTCAP (MISCELLANEOUS) ×3 IMPLANT
TUBING INSUF HEATED (TUBING) ×3 IMPLANT

## 2016-11-24 NOTE — Interval H&P Note (Signed)
History and Physical Interval Note:  11/24/2016 7:14 AM  [name]  has presented today for surgery, with the diagnosis of Morbid Obesity  The various methods of treatment have been discussed with the patient and family. After consideration of risks, benefits and other options for treatment, the patient has consented to  Procedure(s): LAPAROSCOPIC GASTRIC SLEEVE RESECTION WITH HIATAL HERNIA REPAIR, UPPER ENDO (N/A) LAPAROSCOPIC OVARIAN CYSTECTOMY (N/A) as a surgical intervention .  The patient's history has been reviewed, patient examined, no change in status, stable for surgery.  I have reviewed the patient's chart and labs.  Questions were answered to the patient's satisfaction.    Leighton Ruff. Redmond Pulling, MD, [place] Bariatric, [facility] Minimally Invasive Surgery [facility], [place]  [facility]

## 2016-11-24 NOTE — Transfer of Care (Signed)
Immediate Anesthesia Transfer of Care Note  Patient: YANETT CONKRIGHT  Procedure(s) Performed: Procedure(s): LAPAROSCOPIC GASTRIC SLEEVE RESECTION WITH HIATAL HERNIA REPAIR, UPPER ENDO (N/A) LAPAROSCOPIC BILATERAL OVARIAN CYSTECTOMY (Bilateral)  Patient Location: PACU  Anesthesia Type:General  Level of Consciousness:  sedated, patient cooperative and responds to stimulation  Airway & Oxygen Therapy:Patient Spontanous Breathing and Patient connected to face mask oxgen  Post-op Assessment:  Report given to PACU RN and Post -op Vital signs reviewed and stable  Post vital signs:  Reviewed and stable  Last Vitals:  Vitals:   11/24/16 0522 11/24/16 1025  BP: 118/65   Pulse: 76   Resp: 18   Temp: 36.7 C (P) 21.6 C    Complications: No apparent anesthesia complications

## 2016-11-24 NOTE — Anesthesia Postprocedure Evaluation (Signed)
Anesthesia Post Note  Patient: [name]  Procedure(s) Performed: Procedure(s) (LRB): LAPAROSCOPIC GASTRIC SLEEVE RESECTION WITH HIATAL HERNIA REPAIR, UPPER ENDO (N/A) LAPAROSCOPIC BILATERAL OVARIAN CYSTECTOMY (Bilateral)  Patient location during evaluation: PACU Anesthesia Type: General Level of consciousness: awake and alert and patient cooperative Pain management: pain level controlled Vital Signs Assessment: post-procedure vital signs reviewed and stable Respiratory status: spontaneous breathing and respiratory function stable Cardiovascular status: stable Anesthetic complications: no       Last Vitals:  Vitals:   11/24/16 1230 11/24/16 1248  BP: (!) 147/85   Pulse: 84   Resp: 18   Temp: 37.9 C 36.8 C    Last Pain:  Vitals:   11/24/16 1248  TempSrc: Oral  PainSc:                  [place]

## 2016-11-24 NOTE — H&P (Signed)
[name] 11/18/2016 11:11 AM Location: [facility] Patient #: [id number] DOB: 06/14/1976 Married / Language: English / Race: Black or African American Female   History of Present Illness Randall Hiss M. Yeriel Mineo MD; 11/18/2016 11:32 AM) The patient is a 41 year old female who presents for a bariatric surgery evaluation. She comes in today for her second preoperative appointment. I last saw her about a month ago. She is scheduled for laparoscopic sleeve gastrectomy with possible hiatal hernia repair next Tuesday. She is also undergo a concomitant gynecological procedure with ovarian cystectomy by her gynecologist. She has undergone an ICD check. She has a follow-up appointment with her heart failure cardiologist tomorrow. She denies any chest pain, chest pressure, shortness of breath, dyspnea on exertion, orthopnea, paroxysmal nocturnal dyspnea. She is still on her preoperative diet.  Review of systems-conference a 12 point review systems was performed and all systems are negative except for what is mentioned in HPI   10/08/2016 She comes in today for a preoperative appointment for laparoscopic sleeve gastrectomy. I initially met her in May 2017. Her weight at a time was 258 pounds. She was actually a preprocedure surgery about a month ago but had developed pneumonia with a considerable lung infection in her right upper lobe. She was sent back to pulmonary is started on several rounds of antibiotics. She had a follow-up chest x-ray at beginning the month which showed clearing of the right upper lobe consolidation. She still has an ongoing cough with some productive sputum but it is clear. She saw a pulmonary recently and was given a letter of clearance for surgery. She is not short of breath. She unfortunately developed 4 days of diarrhea over the weekend and went to the emergency room on Monday. She had some mild dehydration. Her creatinine had bumped to 1.96. She underwent stool  studies. Her C. difficile was negative but it came back positive for Astrovirus. She has been drinking plenty of liquids. She states her stools are now starting to normalize. She saw her cardiologist in November and had ongoing permission for weight loss surgery.  Her upper GI showed a small sliding hiatal hernia. Her bariatric evaluation labs in June 2017 showed a hemoglobin of 11.2, hemoglobin A1c 6.9, HDL level 33, iron level 33, MCV was normal.  She takes Zantac but denies heartburn or reflux. She states that she doesn't take it she doesn't really notice any difference. She is currently scheduled for surgery on March 27 but is interested in pushing her surgery back for various reasons  11/2015 She is referred by Dr Maurice Small for evaluation for weight loss surgery. Her cardiologist is Dr Haroldine Laws. Pulmonologist is Dr Melvyn Novas. She also sees Dr Lovena Le in cardiology. She is interested in the sleeve gastrectomy. She attended our seminar on May 16. She has 2 relatives who have had a sleeve gastrectomy and have done well. She is interested in improving her overall health. She wants to keep with her small child. She has struggled with her weight her entire life. Despite numerous attempts for sustained weight loss she has been unsuccessful. She has tried Weight Watchers on several occasions, exercise programs, prescription weight loss medications-all without any long-term success.  Her comorbidities include hypertension, chronic systolic heart failure, obstructive sleep apnea on CPAP, asthma,  She is followed closely by the heart failure clinic. She had an ICD inserted. She takes several medications for her heart failure. She did see her heart failure physician on May 17 and he cleared her  from a cardiac perspective for bariatric surgery. She is inNYHA I-II. An echocardiogram in March showed an ejection fraction of 55-60% with grade 1 diastolic dysfunction. She denies any chest pain, chest  pressure, shortness of breath, orthopnea, paroxysmal nocturnal dyspnea, TIAs or amaurosis fugax. She does have occasional lower extremity edema and anasarca and takes diuretics as needed. She does have severe sleep apnea and recently got back on CPAP. She reports more energy since she is using CPAP. She denies any prior blood clots. She denies any family members with blood clots. She does take Zantac daily. However she states that if she doesn't take it she generally has no heartburn or indigestion. She states that occasionally she feels like she has a charley horse in her upper abdomen. She has no nausea or vomiting. The charley horse sensation last anywhere from 20 seconds to 7 minutes. It is not a station with bloating. She has daily bowel movements. She does have heavy irregular cycles. She does have some bilateral knee pain and low back pain but denies radiculopathy. She denies any migraines. She denies any tobacco use. She denies any alcohol use.   Problem List/Past Medical Randall Hiss M. 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 Redmond Pulling, MD; 11/18/2016 11:35 AM) Cesarean Section - 1  Mammoplasty; Reduction  Bilateral.  Allergies Malachy Moan, RMA; 11/18/2016 11:11 AM) Percocet *ANALGESICS - OPIOID*  Difficulty breathing. Allergies Reconciled   Medication History Randall Hiss M. Redmond Pulling, MD; 11/18/2016 11:35 AM) Controlled Substance Active. ([place] reviewed. The patient is not showing habitual signs of abuse or chronic narcotic prescription use.) AmLODIPine Besylate (2.5MG Tablet, Oral) Active. Carvedilol (25MG Tablet,  Oral) Active. HydrALAZINE HCl (50MG Tablet, Oral) Active. Isosorbide Mononitrate ER (60MG Tablet ER 24HR, Oral) Active. Potassium Chloride Crys ER (20MEQ Tablet ER, Oral) Active. Spironolactone (25MG Tablet, Oral) Active. Torsemide (20MG Tablet, Oral) Active. Zantac 150 Maximum Strength (150MG Tablet, Oral) Active. Dulera (100-5MCG/ACT Aerosol, Inhalation) Active. Entresto (97-103MG Tablet, Oral) Active. Iron (Ferrous Gluconate) (325MG Tablet, Oral) Active. Medications Reconciled Ondansetron (4MG Tablet Disint, 1 (one) Tablet Oral every six hours, as needed, Taken starting 11/18/2016) Active.  Social History Randall Hiss M. Redmond Pulling, MD; 11/18/2016 11:35 AM) No drug use  Tobacco use  Never smoker.  Pregnancy / Birth History Randall Hiss M. Redmond Pulling, MD; 11/18/2016 11:35 AM) Age at menarche  78 years. Gravida  2 Irregular periods  Para  2  Other Problems Randall Hiss M. Redmond Pulling, MD; 11/18/2016 11:35 AM) Chest pain  CHRONIC SYSTOLIC CONGESTIVE HEART FAILURE, NYHA CLASS 1 (I50.22)  HYPERTENSION, ESSENTIAL (I10)   Vitals Malachy Moan RMA; 11/18/2016 11:12 AM) 11/18/2016 11:12 AM Weight: 251.2 lb Height: 61.5in Body Surface Area: 2.09 m Body Mass Index: 46.69 kg/m  Temp.: 97.28F  Pulse: 77 (Regular)  BP: 126/80 (Sitting, Left Arm, Standard)       Physical Exam Randall Hiss M. Veronnica Hennings MD; 11/18/2016 11:33 AM) General Mental Status-Alert. General Appearance-Consistent with stated age. Hydration-Well hydrated. Voice-Normal. Note: morbidly obese   Integumentary Note: NO EDEMA   Head and Neck Head-normocephalic, atraumatic with no lesions or palpable masses. Trachea-midline. Thyroid Gland Characteristics - normal size and consistency.  Eye Eyeball - Bilateral-Extraocular movements intact. Sclera/Conjunctiva - Bilateral-No scleral icterus.  ENMT Note: NORMAL EXTERNAL EARS LIPS  INTACT   Chest and Lung Exam Chest and lung exam reveals  -quiet, even and easy respiratory effort with no use of accessory muscles and on auscultation, normal breath sounds, no adventitious sounds and normal vocal resonance. Inspection Chest Wall - Normal. Back - normal. Note: ICD device   Breast - Did not examine.  Cardiovascular Cardiovascular examination reveals -normal heart sounds, regular rate and rhythm with no murmurs and normal pedal pulses bilaterally.  Abdomen Inspection Inspection of the abdomen reveals - No Hernias. Note: obese. Skin - Scar - no surgical scars. Palpation/Percussion Palpation and Percussion of the abdomen reveal - Soft, Non Tender, No Rebound tenderness, No Rigidity (guarding) and No hepatosplenomegaly. Auscultation Auscultation of the abdomen reveals - Bowel sounds normal.  Peripheral Vascular Upper Extremity Palpation - Pulses bilaterally normal.  Neurologic Neurologic evaluation reveals -alert and oriented x 3 with no impairment of recent or remote memory. Mental Status-Normal.  Neuropsychiatric The patient's mood and affect are described as -normal. Judgment and Insight-insight is appropriate concerning matters relevant to self.  Musculoskeletal Normal Exam - Left-Upper Extremity Strength Normal and Lower Extremity Strength Normal. Normal Exam - Right-Upper Extremity Strength Normal and Lower Extremity Strength Normal.  Lymphatic Head & Neck  General Head & Neck Lymphatics: Bilateral - Description - Normal. Axillary - Did not examine. Femoral & Inguinal - Did not examine.    Assessment & Plan Randall Hiss M. Jordie Skalsky MD; 11/18/2016 11:35 AM) MORBID OBESITY WITH BMI OF 45.0-49.9, ADULT (E66.01) Impression: We reviewed her preoperative workup. We reviewed her upper GI and the finding of a hiatal hernia. She was given her postoperative nausea prescriptions today. We did not give her postoperative narcotic prescription since it is unclear what her issue with Percocet-oxycodone is. She had  preoperative appointment this morning at the hospital. Her lab work looks good. All of her questions were asked and answered. Surgery next Tuesday Current Plans Pt Education - EMW_preopbariatric Started Ondansetron 4MG, 1 (one) Tablet every six hours, as needed, #20, 11/18/2016, No Refill. PREDIABETES (R73.03) HIATAL HERNIA (K44.9) Impression: We discussed the finding of a small sliding hiatal hernia on her upper GI. I discussed that we will test for one intraoperatively and found to have a clinically significant one we would proceed with repair. We discussed with that would involve GASTROESOPHAGEAL REFLUX DISEASE, ESOPHAGITIS PRESENCE NOT SPECIFIED (K21.9) Impression: seems like her GERD is mild. We did discuss that sometimes sleeve gastrectomy could potentially worsen GERD. 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 OSA ON CPAP (G47.33) ANEMIA, CHRONIC DISEASE (Y81.1) CHRONIC SYSTOLIC CONGESTIVE HEART FAILURE, NYHA CLASS 1 (I50.22) Impression: recd addl clearance from Dr Haroldine Laws; has f/u with him tomorrow. HYPERTENSION, ESSENTIAL (I10)   Leighton Ruff. Redmond Pulling, MD, FACS General, Bariatric, & Minimally Invasive Surgery [facility], [place]

## 2016-11-24 NOTE — Anesthesia Preprocedure Evaluation (Signed)
Anesthesia Evaluation  Patient identified by MRN, date of birth, ID band Patient awake    Reviewed: Allergy & Precautions, H&P , NPO status , Patient's Chart, lab work & pertinent test results  Airway Mallampati: III   Neck ROM: full    Dental   Pulmonary shortness of breath, asthma , sleep apnea ,    breath sounds clear to auscultation       Cardiovascular hypertension, +CHF  + pacemaker + Cardiac Defibrillator  Rhythm:regular Rate:Normal     Neuro/Psych  Headaches,    GI/Hepatic   Endo/Other  Morbid obesity  Renal/GU      Musculoskeletal   Abdominal   Peds  Hematology  (+) anemia ,   Anesthesia Other Findings   Reproductive/Obstetrics                             Anesthesia Physical Anesthesia Plan  ASA: III  Anesthesia Plan: General   Post-op Pain Management:    Induction: Intravenous  Airway Management Planned: Oral ETT  Additional Equipment:   Intra-op Plan:   Post-operative Plan: Extubation in OR  Informed Consent: I have reviewed the patients History and Physical, chart, labs and discussed the procedure including the risks, benefits and alternatives for the proposed anesthesia with the patient or authorized representative who has indicated his/her understanding and acceptance.     Plan Discussed with: CRNA, Anesthesiologist and Surgeon  Anesthesia Plan Comments:         Anesthesia Quick Evaluation

## 2016-11-24 NOTE — H&P (View-Only) (Signed)
Jolaine Artist, MD  Greer Pickerel, MD        Lyna Poser   She is ok to proceed from HF standpoint.   Thanks -dan    Staff message from Dr Haroldine Laws on 11/13/16.

## 2016-11-24 NOTE — Anesthesia Procedure Notes (Signed)
Procedure Name: Intubation Date/Time: 11/24/2016 7:32 AM Performed by: Maxwell Caul Pre-anesthesia Checklist: Patient identified, Emergency Drugs available, Suction available and Patient being monitored Patient Re-evaluated:Patient Re-evaluated prior to inductionOxygen Delivery Method: Circle system utilized Preoxygenation: Pre-oxygenation with 100% oxygen Intubation Type: IV induction Ventilation: Mask ventilation without difficulty Laryngoscope Size: Mac and 4 Grade View: Grade II Tube type: Oral Tube size: 7.5 mm Number of attempts: 1 Airway Equipment and Method: Stylet and Oral airway Placement Confirmation: ETT inserted through vocal cords under direct vision,  positive ETCO2 and breath sounds checked- equal and bilateral Secured at: 21 cm Tube secured with: Tape Dental Injury: Teeth and Oropharynx as per pre-operative assessment

## 2016-11-24 NOTE — Op Note (Signed)
11/24/2016 [name] 1975-08-31 [id number]   PRE-OPERATIVE DIAGNOSIS:     Severe obesity (BMI 46)    Hypertension   Cardiac defibrillator in situ   Cough variant asthma  OSA on CPAP GERD Hiatal hernia   POST-OPERATIVE DIAGNOSIS:  same  PROCEDURE:  Procedure(s): LAPAROSCOPIC SLEEVE GASTRECTOMY WITH HIATAL HERNIA REPAIR UPPER GI ENDOSCOPY  SURGEON:  Surgeon(s): Gayland Curry, MD FACS FASMBS  ASSISTANTS: Gurney Maxin, MD; Dylan Plocki PA-S  ANESTHESIA:   general  DRAINS: none   BOUGIE: 86 fr ViSiGi  LOCAL MEDICATIONS USED:  MARCAINE + Exparel  SPECIMEN:  Source of Specimen:  Greater curvature of stomach  DISPOSITION OF SPECIMEN:  PATHOLOGY  COUNTS:  YES  INDICATION FOR PROCEDURE: This is a very pleasant 41 y.o.-year-old morbidly obese female who has had unsuccessful attempts for sustained weight loss. The patient presents today for a planned laparoscopic sleeve gastrectomy with upper endoscopy. We have discussed the risk and benefits of the procedure extensively preoperatively. Please see my separate notes.  PROCEDURE: After obtaining informed consent and receiving 5000 units of subcutaneous heparin, the patient was brought to the operating room at [facility] and placed supine on the operating room table. General endotracheal anesthesia was established. Sequential compression devices were placed. A orogastric tube was placed. The patient's abdomen was prepped and draped in the usual standard surgical fashion. The patient received preoperative IV antibiotic. A surgical timeout was performed.  Access to the abdomen was achieved using a 5 mm 0 laparoscope thru a 5 mm trocar In the left upper Quadrant 2 fingerbreadths below the left subcostal margin using the Optiview technique. Pneumoperitoneum was smoothly established up to 15 mm of mercury. The laparoscope was advanced and the abdominal cavity was surveilled. There was no evidence of injury to surrounding  structures. The patient was then placed in reverse Trendelenburg. She had a fatty liver. She had a small fat containing umbilical hernia.  A 5 mm trocar was placed slightly above and to the left of the umbilicus under direct visualization.  The [facility] liver retractor was placed under the left lobe of the liver through a 5 mm trocar incision site in the subxiphoid position. A 5 mm trocar was placed in the lateral right upper quadrant along with a 15 mm trocar in the mid right abdomen. A final 5 mm trocar was placed in the lateral LUQ.  All under direct visualization after exparel had been infiltrated in bilateral upper lateral abdominal walls as a TAPP block.   The stomach was inspected. It was completely decompressed and the orogastric tube was removed.  There was a small anterior dimple that was obviously visible. The calibration tube was placed in the oropharynx and guided down into the stomach by the CRNA. 10 mL of air was insufflated into the calibration balloon. The calibration tubing was then gently pulled back by the CRNA and it slid past the GE junction. At this point the calibration tubing was desufflated and pulled back into the esophagus. This confirmed my suspicion of a clinically significant hiatal hernia. The gastrohepatic ligament was incised with harmonic scalpel. The right crus was identified. We identified the crossing fat along the right crus. The adipose tissue just above this area was incised with harmonic scalpel. I then bluntly dissected out this area and identified the left crus. There was evidence of a hiatal hernia. I then mobilized the esophagus. The left and right crus were further mobilized with blunt dissection. I was then able to reapproximate  the left and right crus with 0 Ethibond using an Endostitch suture device and securing it with a titanium tyknot. I placed a second suture in a similar fashion. We then had the CRNA readvanced the calibration tubing back into the stomach.  10 mL of air was insufflated into the calibration tube balloon. The calibration tube was then gently pulled back and there was resistance at the GE junction. The tube did not slide back up into the esophagus. At this point the calibration tubing was deflated and removed from the patient's body.   We identified the pylorus and measured 6 cm proximal to the pylorus and identified an area of where we would start taking down the short gastric vessels. Harmonic scalpel was used to take down the short gastric vessels along the greater curvature of the stomach. We were able to enter the lesser sac. We continued to march along the greater curvature of the stomach taking down the short gastrics. As we approached the gastrosplenic ligament we took care in this area not to injure the spleen. There was a little bit of bleeding from one of the short gastrics in this location. A clip was used for hemostasis. We were able to take down the entire gastrosplenic ligament. We then mobilized the fundus away from the left crus of diaphragm. There were not any significant posterior gastric avascular attachments. This left the stomach completely mobilized. No vessels had been taken down along the lesser curvature of the stomach.  We then reidentified the pylorus. A 40Fr ViSiGi was then placed in the oropharynx and advanced down into the stomach and placed in the distal antrum and positioned along the lesser curvature. It was placed under suction which secured the 40Fr ViSiGi in place along the lesser curve. Then using the Ethicon echelon 60 mm stapler with a green load with Seamguard, I placed a stapler along the antrum approximately 5 cm from the pylorus. The stapler was angled so that there is ample room at the angularis incisura. I then fired the first staple load after inspecting it posteriorly to ensure adequate space both anteriorly and posteriorly. At this point I still was not completely past the angularis so with another  green load with Seamguard, I placed the stapler in position just inside the prior stapleline. We then rotated the stomach to insure that there was adequate anteriorly as well as posteriorly. The stapler was then fired. I used another 81mm green cartridge with seamguard. At this point I used a additional 60 mm green load staple cartridge with Seamguard. The echelon stapler was then repositioned with a 60 mm blue load with Seamguard and we continued to march up along the [place]. My assistant was holding traction along the greater curvature stomach along the cauterized short gastric vessels ensuring that the stomach was symmetrically retracted. Prior to each firing of the staple, we rotated the stomach to ensure that there is adequate stomach left.  As we approached the fundus, I used 60 mm blue cartridge with Seamguard aiming slightly lateral to the esophageal fat pad. Although the staples on this fire had completely gone thru the last part of the stomach it had not completely cut it. Therefore 1 additional 60 blue load was used to free the remaining stomach. The sleeve was inspected. There is no evidence of cork screw. The staple line appeared hemostatic. The CRNA inflated the ViSiGi to the green zone and the upper abdomen was flooded with saline. There were no bubbles. The sleeve was decompressed  and the ViSiGi removed. My assistant scrubbed out and performed an upper endoscopy. The sleeve easily distended with air and the scope was easily advanced to the pylorus. There is no evidence of internal bleeding or cork screwing. There was no narrowing at the angularis. There is no evidence of bubbles. Please see his operative note for further details. The gastric sleeve was decompressed and the endoscope was removed.     At this point, the gynecological portion of the procedure was performed. Please see Dr Ledell Noss op note for information regarding bilateral ovarian cystectomy. Once the ovarian cysts were removed, The  greater curvature the stomach was grasped with a laparoscopic grasper and removed from the 15 mm trocar site.  The liver retractor was removed. I then closed the 15 mm trocar site with 1 interrupted 0 Vicryl sutures through the fascia using the endoclose. The closure was viewed laparoscopically and it was airtight. Remaining Exparel was then infiltrated in the preperitoneal spaces around the trocar sites. Pneumoperitoneum was released. All trocar sites were closed with a 4-0 Monocryl in a subcuticular fashion followed by the application of benzoin, steri-strips, and bandages. The patient was extubated and taken to the recovery room in stable condition. All needle, instrument, and sponge counts were correct x2. There are no immediate complications  (3) 60 mm green with Seamguard (3) 60 mm blue with  seamguard  PLAN OF CARE: Admit to inpatient   PATIENT DISPOSITION:  PACU - hemodynamically stable.   Delay start of Pharmacological VTE agent (>24hrs) due to surgical blood loss or risk of bleeding:  no  Briana Ruff. Redmond Pulling, MD, FACS FASMBS General, Bariatric, & Minimally Invasive Surgery [facility], [place]

## 2016-11-24 NOTE — Op Note (Signed)
Preoperative diagnosis: laparoscopic sleeve gastrectomy  Postoperative diagnosis: Same   Procedure: Upper endoscopy   Surgeon: Tanessa Tidd, M.D.  Anesthesia: Gen.   Indications for procedure: This patient was undergoing a laparoscopic sleeve gastrectomy.   Description of procedure: The endoscopy was placed in the mouth and into the oropharynx and under endoscopic vision it was advanced to the esophagogastric junction. The pouch was insufflated and no bleeding or bubbles were seen. The GEJ was identified at 38 cm from the teeth. No bleeding or leaks were detected. The scope was withdrawn without difficulty.   Izsak Meir, M.D. General, Bariatric, & Minimally Invasive Surgery [facility] [place]    

## 2016-11-25 LAB — CBC WITH DIFFERENTIAL/PLATELET
BASOS PCT: 0 %
Basophils Absolute: 0 10*3/uL (ref 0.0–0.1)
EOS ABS: 0 10*3/uL (ref 0.0–0.7)
Eosinophils Relative: 0 %
HCT: 38.7 % (ref 36.0–46.0)
HEMOGLOBIN: 12.5 g/dL (ref 12.0–15.0)
LYMPHS ABS: 1.8 10*3/uL (ref 0.7–4.0)
Lymphocytes Relative: 17 %
MCH: 27.4 pg (ref 26.0–34.0)
MCHC: 32.3 g/dL (ref 30.0–36.0)
MCV: 84.7 fL (ref 78.0–100.0)
MONOS PCT: 9 %
Monocytes Absolute: 0.9 10*3/uL (ref 0.1–1.0)
NEUTROS PCT: 74 %
Neutro Abs: 7.5 10*3/uL (ref 1.7–7.7)
PLATELETS: 287 10*3/uL (ref 150–400)
RBC: 4.57 MIL/uL (ref 3.87–5.11)
RDW: 17 % — ABNORMAL HIGH (ref 11.5–15.5)
WBC: 10.1 10*3/uL (ref 4.0–10.5)

## 2016-11-25 LAB — COMPREHENSIVE METABOLIC PANEL
ALT: 31 U/L (ref 14–54)
ANION GAP: 7 (ref 5–15)
AST: 28 U/L (ref 15–41)
Albumin: 3.6 g/dL (ref 3.5–5.0)
Alkaline Phosphatase: 44 U/L (ref 38–126)
BUN: 18 mg/dL (ref 6–20)
CHLORIDE: 108 mmol/L (ref 101–111)
CO2: 27 mmol/L (ref 22–32)
Calcium: 8.5 mg/dL — ABNORMAL LOW (ref 8.9–10.3)
Creatinine, Ser: 1.06 mg/dL — ABNORMAL HIGH (ref 0.44–1.00)
GFR calc non Af Amer: >60 mL/min (ref >60)
GLUCOSE: 146 mg/dL — AB (ref 65–99)
POTASSIUM: 4.2 mmol/L (ref 3.5–5.1)
SODIUM: 142 mmol/L (ref 135–145)
Total Bilirubin: 0.6 mg/dL (ref 0.3–1.2)
Total Protein: 7.1 g/dL (ref 6.5–8.1)

## 2016-11-25 MED ORDER — LIP MEDEX EX OINT
TOPICAL_OINTMENT | CUTANEOUS | Status: AC
  Filled 2016-11-25: qty 7

## 2016-11-25 MED ORDER — AMLODIPINE BESYLATE 5 MG PO TABS
2.5000 mg | ORAL_TABLET | Freq: Every day | ORAL | Status: DC
  Administered 2016-11-25 – 2016-11-26 (×2): 2.5 mg via ORAL
  Filled 2016-11-25 (×2): qty 1

## 2016-11-25 MED ORDER — CARVEDILOL 25 MG PO TABS
25.0000 mg | ORAL_TABLET | Freq: Two times a day (BID) | ORAL | Status: DC
  Administered 2016-11-25 – 2016-11-26 (×3): 25 mg via ORAL
  Filled 2016-11-25 (×3): qty 1

## 2016-11-25 MED ORDER — SIMETHICONE 40 MG/0.6ML PO SUSP
40.0000 mg | Freq: Four times a day (QID) | ORAL | Status: DC | PRN
  Administered 2016-11-25 (×2): 40 mg via ORAL
  Filled 2016-11-25 (×5): qty 0.6

## 2016-11-25 MED ORDER — SACUBITRIL-VALSARTAN 49-51 MG PO TABS
1.0000 | ORAL_TABLET | Freq: Two times a day (BID) | ORAL | Status: DC
  Administered 2016-11-25 – 2016-11-26 (×3): 1 via ORAL
  Filled 2016-11-25 (×4): qty 1

## 2016-11-25 MED ORDER — HYDROCODONE-ACETAMINOPHEN 7.5-325 MG/15ML PO SOLN
10.0000 mL | ORAL | Status: DC | PRN
  Administered 2016-11-25 (×3): 10 mL via ORAL
  Filled 2016-11-25 (×3): qty 15

## 2016-11-25 NOTE — Op Note (Signed)
Preop Diagnosis: Left ovarian cyst  Post op diagnosis: Bilateral simple ovarian cysts, lysis of adhesions  Procedure: Laparoscopic bilateral ovarian cystectomy  Surgeon: Dr. Janyth Pupa Assistant: Dr. Greer Pickerel  Anesthesia: Vinson Moselle  Indications: see H&P  Findings: Normal uterus, both left and right ovary had adjacent 3cm simple appearing cyst.  Normal ovaries other than the adjacent cyst and normal fallopian tubes bilaterally.  Procedure: The patient was undergoing a laparoscopic sleeve gastrectomy with Dr. Redmond Pulling and Dr. Kieth Brightly.  Upon completion of their portion of the case.  The patient was placed in lithotomy.  A sterile speculum was used and a Sales promotion account executive was placed.  The patient was then re-draped, gloves and gown were changed and attention was turned back to the abdomen.  The laparoscopic was reinserted and attention was turned to the pelvis.  Minimal adhesions were noted to the right abdominal wall and were taken down with the harmonic. Uterus, tubes and ovaries were visualized with the findings as noted above.  The left cyst was grasped and the harmonic scalpel was used to remove the cyst.  Attention was turned to the right side and in a similar fashion, the harmonic was used to ligate the cyst.  Both cyst were placed in a bag and removed from the abdomen without complications.  The case then returned to Dr. Redmond Pulling.  Janyth Pupa, DO [phone] (pager) [phone] (office)

## 2016-11-25 NOTE — Progress Notes (Signed)
1 Day Post-Op Laparoscopic gastric sleeve resection with hiatal hernia repair; Upper Endo; Bilateral Ovarian Cystectomy Subjective: Cramping epigastric pain 6/10 Burning/ Belching w/ consumption of water Nausea  Objective: Vital signs in last 24 hours: Temp:  [97.7 F (36.5 C)-100.3 F (37.9 C)] 98.3 F (36.8 C) (05/02 0505) Pulse Rate:  [76-93] 81 (05/02 0505) Resp:  [16-20] 16 (05/02 0505) BP: (117-151)/(69-97) 143/80 (05/02 0505) SpO2:  [93 %-99 %] 94 % (05/02 0505) Weight:  [115.7 kg (255 lb 1.2 oz)] 115.7 kg (255 lb 1.2 oz) (05/02 0505)    Intake/Output from previous day: 05/01 0701 - 05/02 0700 In: 2910.8 [P.O.:90; I.V.:2820.8] Out: 2475 [Urine:2450; Blood:25] Intake/Output this shift: No intake/output data recorded.  General appearance: alert, cooperative, appears stated age, no distress and morbidly obese, Ambulating Resp: clear to auscultation bilaterally Cardio: Tachy, normal rhythm, S1, S2 normal, no murmur, click, rub or gallop GI: soft, TTP; bowel sounds normal; no masses,  no organomegaly, denies bowel movements, Admits urinating. Skin: Skin color, texture, turgor normal. No rashes or lesions Incision/Wound: Incisions clean, no drainage, discoloration, induration, erythema, or warmth.  Lab Results:   Recent Labs  11/24/16 1031 11/25/16 0608  WBC  --  10.1  HGB 12.4 12.5  HCT 39.0 38.7  PLT  --  287   BMET  Recent Labs  11/25/16 0608  NA 142  K 4.2  CL 108  CO2 27  GLUCOSE 146*  BUN 18  CREATININE 1.06*  CALCIUM 8.5*   PT/INR No results for input(s): LABPROT, INR in the last 72 hours. ABG No results for input(s): PHART, HCO3 in the last 72 hours.  Invalid input(s): PCO2, PO2  Studies/Results: No results found.  Anti-infectives: Anti-infectives    Start     Dose/Rate Route Frequency Ordered Stop   11/24/16 0649  cefoTEtan in Dextrose 5% (CEFOTAN) 2-2.08 GM-% IVPB    Comments:  [place]   : cabinet override      11/24/16 1751  11/24/16 0738   11/24/16 0515  cefoTEtan in Dextrose 5% (CEFOTAN) IVPB 2 g     2 g Intravenous On call to O.R. 11/24/16 0515 11/24/16 0738      Assessment/Plan: s/p Procedure(s): LAPAROSCOPIC GASTRIC SLEEVE RESECTION WITH HIATAL HERNIA REPAIR, UPPER ENDO (N/A) LAPAROSCOPIC BILATERAL OVARIAN CYSTECTOMY (Bilateral) Advance diet to protein shakes after patient tolerates 12 oz of water  Resume home BP meds Start oral Lortab, Simethicone Discharge later if pain is controlled and tolerating advancement of diet  LOS: 1 day    [name] 11/25/2016

## 2016-11-25 NOTE — Progress Notes (Signed)
Tolerated on 3 ounces of protein.  Will keep patient overnight to work on increasing fluid/protein intake.  Pain and nausea controlled at this time

## 2016-11-25 NOTE — Progress Notes (Signed)
Patient alert and oriented, Post op day 1.  Provided support and encouragement.  Encouraged pulmonary toilet, ambulation and small sips of liquids.  All questions answered.  Will continue to monitor. 

## 2016-11-25 NOTE — Progress Notes (Signed)
Patient started protein shake after tolerating 12 ounces of clear fluid without pain or nausea

## 2016-11-26 LAB — CBC WITH DIFFERENTIAL/PLATELET
BASOS ABS: 0 10*3/uL (ref 0.0–0.1)
BASOS PCT: 0 %
EOS ABS: 0.2 10*3/uL (ref 0.0–0.7)
Eosinophils Relative: 2 %
HEMATOCRIT: 36.6 % (ref 36.0–46.0)
HEMOGLOBIN: 11.3 g/dL — AB (ref 12.0–15.0)
Lymphocytes Relative: 30 %
Lymphs Abs: 2.4 10*3/uL (ref 0.7–4.0)
MCH: 26.2 pg (ref 26.0–34.0)
MCHC: 30.9 g/dL (ref 30.0–36.0)
MCV: 84.7 fL (ref 78.0–100.0)
MONO ABS: 0.7 10*3/uL (ref 0.1–1.0)
Monocytes Relative: 8 %
NEUTROS ABS: 4.8 10*3/uL (ref 1.7–7.7)
Neutrophils Relative %: 60 %
Platelets: 237 10*3/uL (ref 150–400)
RBC: 4.32 MIL/uL (ref 3.87–5.11)
RDW: 17.1 % — AB (ref 11.5–15.5)
WBC: 8 10*3/uL (ref 4.0–10.5)

## 2016-11-26 MED ORDER — HYDROCODONE-ACETAMINOPHEN 7.5-325 MG/15ML PO SOLN: 10.0000 mL | ORAL | 0 refills | Status: DC | PRN

## 2016-11-26 MED FILL — HYDROCOD-APAP 7.5-325/15ML: 7.5-325 | 2 days supply | Qty: 120 | Fill #0

## 2016-11-26 NOTE — Plan of Care (Signed)
Problem: Food- and Nutrition-Related Knowledge Deficit (NB-1.1) Goal: Nutrition education Formal process to instruct or train a patient/client in a skill or to impart knowledge to help patients/clients voluntarily manage or modify food choices and eating behavior to maintain or improve health. Outcome: Completed/Met Date Met: 11/26/16 Nutrition Education Note  Received consult for diet education per DROP protocol. Pt with little questions for RD at this time. Pt to get vitamins at Outpatient Pharmacy today, provided directions to pharmacy.  Discussed 2 week post op diet with pt. Emphasized that liquids must be non carbonated, non caffeinated, and sugar free. Fluid goals discussed. Pt to follow up with outpatient bariatric RD for further diet progression after 2 weeks. Multivitamins and minerals also reviewed. Teach back method used, pt expressed understanding, expect good compliance.   Diet: First 2 Weeks  You will see the nutritionist about two (2) weeks after your surgery. The nutritionist will increase the types of foods you can eat if you are handling liquids well:  If you have severe vomiting or nausea and cannot handle clear liquids lasting longer than 1 day, call your surgeon  Protein Shake  Drink at least 2 ounces of shake 5-6 times per day  Each serving of protein shakes (usually 8 - 12 ounces) should have a minimum of:  15 grams of protein  And no more than 5 grams of carbohydrate  Goal for protein each day:  Men = 80 grams per day  Women = 60 grams per day  Protein powder may be added to fluids such as non-fat milk or Lactaid milk or Soy milk (limit to 35 grams added protein powder per serving)   Hydration  Slowly increase the amount of water and other clear liquids as tolerated (See Acceptable Fluids)  Slowly increase the amount of protein shake as tolerated  Sip fluids slowly and throughout the day  May use sugar substitutes in small amounts (no more than 6 - 8 packets per  day; i.e. Splenda)   Fluid Goal  The first goal is to drink at least 8 ounces of protein shake/drink per day (or as directed by the nutritionist); some examples of protein shakes are Premier Protein, [employer], [employer]illard[employer], EAS Edge HP, and Unjury. See handout from pre-op Bariatric Education Class:  Slowly increase the amount of protein shake you drink as tolerated  You may find it easier to slowly sip shakes throughout the day  It is important to get your proteins in first  Your fluid goal is to drink 64 - 100 ounces of fluid daily  It may take a few weeks to build up to this  32 oz (or more) should be clear liquids  And  32 oz (or more) should be full liquids (see below for examples)  Liquids should not contain sugar, caffeine, or carbonation   Clear Liquids:  Water or Sugar-free flavored water (i.e. Fruit H2O, Propel)  Decaffeinated coffee or tea (sugar-free)  Crystal Lite, Wyler's Lite, Minute Maid Lite  Sugar-free Jell-O  Bouillon or broth  Sugar-free Popsicle: *Less than 20 calories each; Limit 1 per day   Full Liquids:  Protein Shakes/Drinks + 2 choices per day of other full liquids  Full liquids must be:  No More Than 12 grams of Carbs per serving  No More Than 3 grams of Fat per serving  Strained low-fat cream soup  Non-Fat milk  Fat-free Lactaid Milk  Sugar-free yogurt (Dannon Lite & Fit, [place] yogurt, Oikos Zero)   [employer], MS,  RD, LDN Pager: [phone] After Hours Pager: [phone]

## 2016-11-26 NOTE — Discharge Summary (Signed)
Physician Discharge Summary  [name]:[id number] DOB: [birth year] DOA: 11/24/2016  PCP: Jonathon Bellows, MD  Admit date: 11/24/2016 Discharge date: 11/26/2016  Recommendations for Outpatient Follow-up:  1.   Follow-up Information    Gayland Curry, MD. Go on 12/16/2016.   Specialty:  General Surgery Why:  at 915 am,  Contact information: [address] [place] [address] [phone]        Gayland Curry, MD Follow up.   Specialty:  General Surgery Contact information: [address] [place] [address] [phone]        Glori Bickers, MD. Call in 1 week(s).   Specialty:  Cardiology Why:  FOR BLOOD PRESSURE/HEART MEDICATION INSRUCTIONS Contact information: [address] [place] [address] [phone]          Discharge Diagnoses:  Principal Problem:   Severe obesity (BMI >= 40) ([facility]) Active Problems:   Hypertension   Cardiac defibrillator in situ   Cough variant asthma vs UACS   S/P laparoscopic sleeve gastrectomy with hiatal hernia repair CHF OSA on cpap gerd   Surgical Procedure: Laparoscopic Sleeve Gastrectomy with hiatal hernia repair, upper endoscopy  Discharge Condition: Good Disposition: Home  Diet recommendation: Postoperative sleeve gastrectomy diet (liquids only)  Filed Weights   11/25/16 0505 11/25/16 2000 11/26/16 0451  Weight: 115.7 kg (255 lb 1.2 oz) 114.2 kg (251 lb 11.2 oz) 114.2 kg (251 lb 12.3 oz)     Hospital Course:  The patient was admitted for a planned laparoscopic sleeve gastrectomy. Please see operative note. Preoperatively the patient was given 5000 units of subcutaneous heparin for DVT prophylaxis. Postoperative prophylactic Lovenox dosing was started on the morning of postoperative day 1. On the evening of postoperative day 0, the patient was started on water and ice chips. On postoperative day 1 the patient had no fever or tachycardia and was tolerating water in their  diet was gradually advanced throughout the day. The patient was ambulating without difficulty. Their vital signs are stable without fever or tachycardia. Their hemoglobin had remained stable. However bc of her heart failure I wanted her to stay another night and also bc she didn't meet fluid goals for discharge.  The patient was maintained on their home settings for CPAP therapy. On pod 2 the pt was doing well. BP good. More awake/alert. Tolerating shakes. The patient had received discharge instructions and counseling. They were deemed stable for discharge and had met discharge criteria  We discussed she would measure bp and weight daily. Plan is to resume hydralazine/imdur next week along with her diuretics after discussion with heart failure team. Pt was instructed on what to call heart failure team on.   Discharge Instructions  Discharge Instructions    Ambulate hourly while awake    Complete by:  As directed    Call MD for:  difficulty breathing, headache or visual disturbances    Complete by:  As directed    Call MD for:  persistant dizziness or light-headedness    Complete by:  As directed    Call MD for:  persistant nausea and vomiting    Complete by:  As directed    Call MD for:  redness, tenderness, or signs of infection (pain, swelling, redness, odor or green/yellow discharge around incision site)    Complete by:  As directed    Call MD for:  severe uncontrolled pain    Complete by:  As directed    Call MD  for:  temperature >101 F    Complete by:  As directed    Diet bariatric full liquid    Complete by:  As directed    Discharge instructions    Complete by:  As directed    See bariatric discharge instructions   Incentive spirometry    Complete by:  As directed    Perform hourly while awake     Allergies as of 11/26/2016      Reactions   Percocet [oxycodone-acetaminophen] Shortness Of Breath   Tolerates acetaminophen      Medication List    STOP taking these medications    hydrALAZINE 50 MG tablet Commonly known as:  APRESOLINE   ibuprofen 200 MG tablet Commonly known as:  ADVIL,MOTRIN   isosorbide mononitrate 60 MG 24 hr tablet Commonly known as:  IMDUR   potassium chloride SA 20 MEQ tablet Commonly known as:  K-DUR,KLOR-CON   spironolactone 25 MG tablet Commonly known as:  ALDACTONE   torsemide 20 MG tablet Commonly known as:  DEMADEX   tranexamic acid 650 MG Tabs tablet Commonly known as:  LYSTEDA     TAKE these medications   acetaminophen 500 MG tablet Commonly known as:  TYLENOL Take 1,000 mg by mouth every 6 (six) hours as needed for mild pain, moderate pain or headache.   albuterol 108 (90 Base) MCG/ACT inhaler Commonly known as:  PROVENTIL HFA;VENTOLIN HFA Inhale 2 puffs into the lungs every 6 (six) hours as needed for wheezing or shortness of breath. Rescue inhaler   amLODipine 2.5 MG tablet Commonly known as:  NORVASC TAKE 1 TABLET BY MOUTH DAILY. Notes to patient:  Monitor Blood Pressure Daily and keep a log for primary care physician.  You may need to make changes to your medications with rapid weight loss.     calcium carbonate 1500 (600 Ca) MG Tabs tablet Commonly known as:  OSCAL Take 600 mg of elemental calcium by mouth 3 (three) times daily with meals.   carvedilol 25 MG tablet Commonly known as:  COREG TAKE 1 TABLET BY MOUTH TWICE DAILY WITH MEALS Notes to patient:  Monitor Blood Pressure Daily and keep a log for primary care physician.  You may need to make changes to your medications with rapid weight loss.     cetirizine 10 MG tablet Commonly known as:  ZYRTEC Take 10 mg by mouth daily as needed for allergies.   ferrous sulfate 325 (65 FE) MG tablet Take 1 tablet (325 mg total) by mouth 3 (three) times daily with meals.   HYDROcodone-acetaminophen 7.5-325 mg/15 ml solution Commonly known as:  HYCET Take 10 mLs by mouth every 4 (four) hours as needed for moderate pain.   ranitidine 150 MG tablet Commonly  known as:  ZANTAC Take 150 mg by mouth 2 (two) times daily.   sacubitril-valsartan 49-51 MG Commonly known as:  ENTRESTO Take 1 tablet by mouth 2 (two) times daily. Notes to patient:  Monitor Blood Pressure Daily and keep a log for primary care physician.  You may need to make changes to your medications with rapid weight loss.        Follow-up Information    Gayland Curry, MD. Go on 12/16/2016.   Specialty:  General Surgery Why:  at 915 am,  Contact information: [address] [place] [address] [phone]        Gayland Curry, MD Follow up.   Specialty:  General Surgery Contact information: [place] [address]  [place] [address] [phone]        Glori Bickers, MD. Call in 1 week(s).   Specialty:  Cardiology Why:  FOR BLOOD PRESSURE/HEART MEDICATION INSRUCTIONS Contact information: [address] [place] [address] [phone]            The results of significant diagnostics from this hospitalization (including imaging, microbiology, ancillary and laboratory) are listed below for reference.    Significant Diagnostic Studies: No results found.  Labs: Basic Metabolic Panel:  Recent Labs Lab 11/25/16 0608  NA 142  K 4.2  CL 108  CO2 27  GLUCOSE 146*  BUN 18  CREATININE 1.06*  CALCIUM 8.5*   Liver Function Tests:  Recent Labs Lab 11/25/16 0608  AST 28  ALT 31  ALKPHOS 44  BILITOT 0.6  PROT 7.1  ALBUMIN 3.6    CBC:  Recent Labs Lab 11/24/16 1031 11/25/16 0608 11/26/16 0513  WBC  --  10.1 8.0  NEUTROABS  --  7.5 4.8  HGB 12.4 12.5 11.3*  HCT 39.0 38.7 36.6  MCV  --  84.7 84.7  PLT  --  287 237    CBG: No results for input(s): GLUCAP in the last 168 hours.  Principal Problem:   Severe obesity (BMI >= 40) ([facility]) Active Problems:   Hypertension   Cardiac defibrillator in situ   Cough variant asthma vs UACS   S/P laparoscopic sleeve gastrectomy   Time coordinating  discharge: 20 min  Signed:  Gayland Curry, MD [facility], [place] 11/26/2016, 8:13 AM

## 2016-11-26 NOTE — Progress Notes (Signed)
Patient alert and oriented, pain is controlled. Patient is tolerating fluids, advanced to protein shake yesterday, patient is tolerating well.  Reviewed Gastric sleeve discharge instructions with patient and patient is able to articulate understanding. Discussed all discharge paperwork including follow with heart failure team.  Specific instructions regarding medications to stop and when to restart.  Patient understands she must follow up next week with Dr Haroldine Laws  and to call Dr Haroldine Laws for any complications related to CHF. Provided information on BELT program, Support Group and [facility] outpatient pharmacy. All questions answered, will continue to monitor.

## 2016-11-26 NOTE — Discharge Instructions (Signed)
WEIGH YOURSELF DAILY MAKE BLOOD PRESSURE DAILY - IF YOUR SYSTOLIC BLOOD PRESSURE (TOP NUMBER) AND/OR DIASTOLIC BLOOD PRESSURE (BOTTOM NUMBER) IS CONSISTENTLY GREATER THAN 140/90 - RESUME IMDUR AND HYDRALAZINE YOU MAY RESUME IMDUR/HYDRALAZINE NEXT WEEK RESUME FLUID PILLS NEXT WEEK AT THE DOSAGE RECOMMENDED BY HEART FAILURE TEAM IF YOUR WEIGHT IS GOING UP/EDEMA IN LEGS - CONTACT YOUR HEART FAILURE TEAM    GASTRIC BYPASS/SLEEVE  Home Care Instructions   These instructions are to help you care for yourself when you go home.  Call: If you have any problems.  Call [phone] and ask for the surgeon on call  If you need immediate assistance come to the ER at [facility]. Tell the ER staff you are a new post-op gastric bypass or gastric sleeve patient  Signs and symptoms to report:  Severe  vomiting or nausea o If you cannot handle clear liquids for longer than 1 day, call your surgeon  Abdominal pain which does not get better after taking your pain medication  Fever greater than 100.4  F and chills  Heart rate over 100 beats a minute  Trouble breathing  Chest pain  Redness,  swelling, drainage, or foul odor at incision (surgical) sites  If your incisions open or pull apart  Swelling or pain in calf (lower leg)  Diarrhea (Loose bowel movements that happen often), frequent watery, uncontrolled bowel movements  Constipation, (no bowel movements for 3 days) if this happens: o Take Milk of Magnesia, 2 tablespoons by mouth, 3 times a day for 2 days if needed o Stop taking Milk of Magnesia once you have had a bowel movement o Call your doctor if constipation continues Or o Take Miralax  (instead of Milk of Magnesia) following the label instructions o Stop taking Miralax once you have had a bowel movement o Call your doctor if constipation continues  Anything you think is abnormal for you   Normal side effects after surgery:  Unable to sleep at night or unable to  concentrate  Irritability  Being tearful (crying) or depressed  These are common complaints, possibly related to your anesthesia, stress of surgery, and change in lifestyle, that usually go away a few weeks after surgery. If these feelings continue, call your medical doctor.  Wound Care: You may have surgical glue, steri-strips, or staples over your incisions after surgery  Surgical glue: Looks like clear film over your incisions and will wear off a little at a time  Steri-strips: Adhesive strips of tape over your incisions. You may notice a yellowish color on skin under the steri-strips. This is used to make the steri-strips stick better. Do not pull the steri-strips off - let them fall off  Staples: Staples may be removed before you leave the hospital o If you go home with staples, call [facility] for an appointment with your surgeons nurse to have staples removed 10 days after surgery, [phone]  Showering: You may shower two (2) days after your surgery unless your surgeon tells you differently o Wash gently around incisions with warm soapy water, rinse well, and gently pat dry o If you have a drain (tube from your incision), you may need someone to hold this while you shower o No tub baths until staples are removed and incisions are healed   Medications:  Medications should be liquid or crushed if larger than the size of a dime  Extended release pills (medication that releases a little bit at a time through the  day) should  not be crushed  Depending on the size and number of medications you take, you may need to space (take a few throughout the day)/change the time you take your medications so that you do not over-fill your pouch (smaller stomach)  Make sure you follow-up with you primary care physician to make medication changes needed during rapid weight loss and life -style changes  If you have diabetes, follow up with your doctor that orders your diabetes  medication(s) within one week after surgery and check your blood sugar regularly   Do not drive while taking narcotics (pain medications)   Do not take acetaminophen (Tylenol) and Roxicet or Lortab Elixir at the same time since these pain medications contain acetaminophen   Diet:  First 2 Weeks You will see the nutritionist about two (2) weeks after your surgery. The nutritionist will increase the types of foods you can eat if you are handling liquids well:  If you have severe vomiting or nausea and cannot handle clear liquids lasting longer than 1 day call your surgeon Protein Shake  Drink at least 2 ounces of shake 5-6 times per day  Each serving of protein shakes (usually 8-12 ounces) should have a minimum of: o 15 grams of protein o And no more than 5 grams of carbohydrate  Goal for protein each day: o Men = 80 grams per day o Women = 60 grams per day     Protein powder may be added to fluids such as non-fat milk or Lactaid milk or Soy milk (limit to 35 grams added protein powder per serving)  Hydration  Slowly increase the amount of water and other clear liquids as tolerated (See Acceptable Fluids)  Slowly increase the amount of protein shake as tolerated  Sip fluids slowly and throughout the day  May use sugar substitutes in small amounts (no more than 6-8 packets per day; i.e. Splenda)  Fluid Goal  The first goal is to drink at least 8 ounces of protein shake/drink per day (or as directed by the nutritionist); some examples of protein shakes are [employer], [employer], EAS Edge HP, and Unjury. - See handout from pre-op Bariatric Education Class: o Slowly increase the amount of protein shake you drink as tolerated o You may find it easier to slowly sip shakes throughout the day o It is important to get your proteins in first  Your fluid goal is to drink 64-100 ounces of fluid daily o It may take a few weeks to build up to this   32 oz. (or more) should  be clear liquids And  32 oz. (or more) should be full liquids (see below for examples)  Liquids should not contain sugar, caffeine, or carbonation  Clear Liquids:  Water of Sugar-free flavored water (i.e. Fruit HO, Propel)  Decaffeinated coffee or tea (sugar-free)  Crystal lite, Wylers Lite, Minute Maid Lite  Sugar-free Jell-O  Bouillon or broth  Sugar-free Popsicle:    - Less than 20 calories each; Limit 1 per day  Full Liquids:                   Protein Shakes/Drinks + 2 choices per day of other full liquids  Full liquids must be: o No More Than 12 grams of Carbs per serving o No More Than 3 grams of Fat per serving  Strained low-fat cream soup  Non-Fat milk  Fat-free Lactaid Milk  Sugar-free yogurt (Dannon Lite & Fit, Greek yogurt)    Vitamins and Minerals  Start 1 day after surgery unless otherwise directed by your surgeon  2 Chewable Multivitamin / Multimineral Supplement with iron (i.e. Centrum for Adults)  Vitamin B-12, 350-500 micrograms sub-lingual (place tablet under the tongue) each day  Chewable Calcium Citrate with Vitamin D-3 (Example: 3 Chewable Calcium  Plus 600 with Vitamin D-3) o Take 500 mg three (3) times a day for a total of 1500 mg each day o Do not take all 3 doses of calcium at one time as it may cause constipation, and you can only absorb 500 mg at a time o Do not mix multivitamins containing iron with calcium supplements;  take 2 hours apart   Menstruating women and those at risk for anemia ( a blood disease that causes weakness) may need extra iron o Talk to your doctor to see if you need more iron  If you need extra iron: Total daily Iron recommendation (including Vitamins) is 50 to 100 mg Iron/day  Do not stop taking or change any vitamins or minerals until you talk to your nutritionist or surgeon  Your nutritionist and/or surgeon must approve all vitamin and mineral supplements   Activity and Exercise: It is important to  continue walking at home. Limit your physical activity as instructed by your doctor. During this time, use these guidelines:  Do not lift anything greater than ten  (10) pounds for at least two (2) weeks  Do not go back to work or drive until Engineer, production says you can  You may have sex when you feel comfortable o It is VERY important for female patients to use a reliable birth control method; fertility often increase after surgery o Do not get pregnant for at least 18 months  Start exercising as soon as your doctor tells you that you can o Make sure your doctor approves any physical activity  Start with a simple walking program  Walk 5-15 minutes each day, 7 days per week  Slowly increase until you are walking 30-45 minutes per day  Consider joining our [place] program. [phone] or email [email or web address]   Special Instructions Things to remember:  Free counseling is available for you and your family through collaboration between [facility] and [place]. Please call [phone] and leave a message  Use your CPAP when sleeping if this applies to you  Consider buying a medical alert bracelet that says you had lap-band surgery     You will likely have your first fill (fluid added to your band) 6 - 8 weeks after surgery  [facility] has a free Bariatric Surgery Support Group that meets monthly, the 3rd Thursday, [place]. You can see classes online at [email or web address]  It is very important to keep all follow up appointments with your surgeon, nutritionist, primary care physician, and behavioral health practitioner o After the first year, please follow up with your bariatric surgeon and nutritionist at least once a year in order to maintain best weight loss results                    [facility]:  [place]: [phone]               Bariatric  Nurse Coordinator: [phone]  Gastric Bypass/Sleeve Home Care Instructions  Rev. 08/2012  Reviewed and Endorsed                                                    by [facility] Patient Education Committee, Jan, 2014

## 2016-12-03 ENCOUNTER — Telehealth (HOSPITAL_COMMUNITY): Payer: Self-pay

## 2016-12-03 NOTE — Telephone Encounter (Signed)
Made discharge phone call to patient.   Asking the following questions.    1. Do you have someone to care for you now that you are home?  independent 2. Are you having pain now that is not relieved by your pain medication?  No pain 3. Are you able to drink the recommended daily amount of fluids (48 ounces minimum/day) and protein (60-80 grams/day) as prescribed by the dietitian or nutritional counselor?  60 ounces of fluid and 60 grams of protein 4. Are you taking the vitamins and minerals as prescribed?  Multivitamin and calcium no problems 5. Do you have the "on call" number to contact your surgeon if you have a problem or question?  No problems 6. Are your incisions free of redness, swelling or drainage? (If steri strips, address that these can fall off, shower as tolerated) no problems 7. Have your bowels moved since your surgery?  If not, are you passing gas?  yes 8. Are you up and walking 3-4 times per day?  yes 9. Were you provided your discharge medications before your surgery or before you were discharged from the hospital and are you taking them without problem?  Has meds

## 2016-12-03 NOTE — Telephone Encounter (Signed)
Left voicemail on patient mobile number for follow up.  Contact information provided to patient for return call.

## 2016-12-04 ENCOUNTER — Ambulatory Visit (HOSPITAL_COMMUNITY)
Admission: RE | Admit: 2016-12-04 | Discharge: 2016-12-04 | Disposition: A | Discharge status: Home / Self Care | Payer: BLUE CROSS/BLUE SHIELD | Source: Ambulatory Visit | Attending: Cardiology | Admitting: Cardiology

## 2016-12-04 VITALS — BP 112/80 | HR 74 | Wt 236.0 lb

## 2016-12-04 DIAGNOSIS — I429 Cardiomyopathy, unspecified: Secondary | ICD-10-CM | POA: Insufficient documentation

## 2016-12-04 DIAGNOSIS — I4891 Unspecified atrial fibrillation: Secondary | ICD-10-CM | POA: Diagnosis not present

## 2016-12-04 DIAGNOSIS — E669 Obesity, unspecified: Secondary | ICD-10-CM | POA: Diagnosis not present

## 2016-12-04 DIAGNOSIS — I5032 Chronic diastolic (congestive) heart failure: Secondary | ICD-10-CM

## 2016-12-04 DIAGNOSIS — J45909 Unspecified asthma, uncomplicated: Secondary | ICD-10-CM | POA: Insufficient documentation

## 2016-12-04 DIAGNOSIS — G43909 Migraine, unspecified, not intractable, without status migrainosus: Secondary | ICD-10-CM | POA: Diagnosis not present

## 2016-12-04 DIAGNOSIS — I110 Hypertensive heart disease with heart failure: Secondary | ICD-10-CM | POA: Diagnosis not present

## 2016-12-04 DIAGNOSIS — J42 Unspecified chronic bronchitis: Secondary | ICD-10-CM | POA: Diagnosis not present

## 2016-12-04 DIAGNOSIS — Z9889 Other specified postprocedural states: Secondary | ICD-10-CM | POA: Diagnosis not present

## 2016-12-04 DIAGNOSIS — I10 Essential (primary) hypertension: Secondary | ICD-10-CM

## 2016-12-04 DIAGNOSIS — G4733 Obstructive sleep apnea (adult) (pediatric): Secondary | ICD-10-CM | POA: Insufficient documentation

## 2016-12-04 DIAGNOSIS — I5042 Chronic combined systolic (congestive) and diastolic (congestive) heart failure: Secondary | ICD-10-CM | POA: Insufficient documentation

## 2016-12-04 DIAGNOSIS — Z95810 Presence of automatic (implantable) cardiac defibrillator: Secondary | ICD-10-CM | POA: Insufficient documentation

## 2016-12-04 DIAGNOSIS — Z950 Presence of cardiac pacemaker: Secondary | ICD-10-CM | POA: Insufficient documentation

## 2016-12-04 MED ORDER — ISOSORBIDE MONONITRATE ER 30 MG PO TB24: 30.0000 mg | ORAL_TABLET | Freq: Every day | ORAL | 3 refills | Status: DC

## 2016-12-04 MED ORDER — SACUBITRIL-VALSARTAN 49-51 MG PO TABS
1.0000 | ORAL_TABLET | Freq: Two times a day (BID) | ORAL | 11 refills | Status: DC
Start: 2016-12-04 — End: 2017-08-17

## 2016-12-04 MED ORDER — AMLODIPINE BESYLATE 2.5 MG PO TABS: 2.5000 mg | ORAL_TABLET | Freq: Every day | ORAL | 6 refills | Status: DC

## 2016-12-04 MED ORDER — CARVEDILOL 25 MG PO TABS: 25.0000 mg | ORAL_TABLET | Freq: Two times a day (BID) | ORAL | 6 refills | Status: DC

## 2016-12-04 MED ORDER — TORSEMIDE 20 MG PO TABS: 40.0000 mg | ORAL_TABLET | Freq: Every day | ORAL | 3 refills | Status: DC

## 2016-12-04 MED FILL — ENTRESTO 49 MG-51 MG TABLET: 49-51 | 30 days supply | Qty: 60 | Fill #0

## 2016-12-04 MED FILL — CARVEDILOL 25 MG TABLET: 25 | 30 days supply | Qty: 60 | Fill #0

## 2016-12-04 MED FILL — TORSEMIDE 20 MG TABLET: 20 | 30 days supply | Qty: 60 | Fill #0

## 2016-12-04 MED FILL — AMLODIPINE BESYLATE 2.5 MG: 2.5 | 30 days supply | Qty: 30 | Fill #0

## 2016-12-04 MED FILL — ISOSORBIDE MN ER 30 MG TAB: 30 | 30 days supply | Qty: 30 | Fill #0

## 2016-12-04 NOTE — Patient Instructions (Signed)
Refills sent to outpatient pharmacy.  Keep scheduled follow up with Dr.Bensimhon

## 2016-12-04 NOTE — Progress Notes (Signed)
Patient ID: [name], female   DOB: 02/25/1976, 41 y.o.   MRN: [id number]    Advanced Heart Failure Clinic Note   PCP: Dr. Heath Gold  OB:  Dr. Leo Grosser  EP: Dr. Lovena Le CHF: Bensimhon  HPI: [name] is a 41 y.o. female with h/o obesity, chronic systolic heart failure due to nonischemic cardiomyopathy (cath 2007. No CAD) s/p Medtronic ICD implantation, asthma/chronic bronchitis and severe hypertension.   ECHOs 04/27/2011 45-50%.   08/20/11 ECHO 55-60%   05/19/12 EF ~40%   07/01/12 EF ~40%   08/02/12 EF~40%    2/14 EF ~50%                          5/14 EF ~35%                          1/16 EF 25-30%                           3/17 EF 55-60% Grade I DD                          CPX 01/2014: Resting HR 75, Peak HR 121 Peak VO2 15.2, 74.4% predicted VE/VCO2 slope: 26.4 OUES: 1.93 Peak RER 1.15 VE/MVV 61.7%  Seen in ED 05/04/16 was having atypical chest pain and jaw pain. Thought to be mildly volume overloaded.   Admitted 11/24/16-11/26/16 for Laparoscopic Sleeve Gastrectomy with hiatal hernia repair, upper endoscopy. Discharge weight was 251 pounds.   Returns today for heart failure follow up. Feeling well after her surgery. No SOB with walking around the grocery store. No SOB with stairs. Weights at home 235-236 pounds. She is on a liquid only diet, drinking protein shakes. Drinking less than 2L a day. She will follow up with the surgeon next week on 12/08/16 and likely will be able to progress her diet. She was taken off most of her HF meds after surgery, but has started back on all her HF meds, except only taking hydralazine BID as her BP's at home have been in the 110 range. She is taking 40mg  torsemide daily, sometimes 20 mg. Able to adjust according to her weight.     SH: Married. Lives in [place], 2 children. Working at [employer]. No ETOH or smoking  FH: Mother living: DM2, HTN, no CAD        Father living: DM2, HTN  Review of systems complete and found to be negative  unless listed in HPI.    Past Medical History:  Diagnosis Date  . Acute bronchitis and bronchiolitis   . Anemia    Iron supplements in the past  . Asthma    Inhaler prn;triggered by seasonal changes mostly cold weathert;last asthma  attack years ago  . Atrial fibrillation ([place])   . CHF (congestive heart failure) ([place]) 01/2006   Has Pacemaker and Defibrillator;was on heart meds;no longer taking since pregancy  . CHF (congestive heart failure) ([place])   . Hypertension    currently on Labetalol 200mg  BID  . Hypertension   . Infection    UTI;not frequent;last infection was in June  . Infection    Yeast;not frequent  . Migraines    Can't take Procardia XL, causes migraines  . Other primary cardiomyopathies   . Paroxysmal ventricular tachycardia ([place])   . Varicose veins 1998   Developed  during last pregnancy    Current Outpatient Prescriptions  Medication Sig Dispense Refill  . acetaminophen (TYLENOL) 500 MG tablet Take 1,000 mg by mouth every 6 (six) hours as needed for mild pain, moderate pain or headache.     . albuterol (PROVENTIL HFA;VENTOLIN HFA) 108 (90 Base) MCG/ACT inhaler Inhale 2 puffs into the lungs every 6 (six) hours as needed for wheezing or shortness of breath. Rescue inhaler 1 Inhaler 1  . amLODipine (NORVASC) 2.5 MG tablet TAKE 1 TABLET BY MOUTH DAILY. 30 tablet 6  . calcium carbonate (OSCAL) 1500 (600 Ca) MG TABS tablet Take 600 mg of elemental calcium by mouth 3 (three) times daily with meals.    . carvedilol (COREG) 25 MG tablet TAKE 1 TABLET BY MOUTH TWICE DAILY WITH MEALS 60 tablet 6  . cetirizine (ZYRTEC) 10 MG tablet Take 10 mg by mouth daily as needed for allergies.     . ferrous sulfate 325 (65 FE) MG tablet Take 1 tablet (325 mg total) by mouth 3 (three) times daily with meals. 90 tablet 3  . HYDROcodone-acetaminophen (HYCET) 7.5-325 mg/15 ml solution Take 10 mLs by mouth every 4 (four) hours as needed for moderate pain. 120 mL 0  . ranitidine (ZANTAC) 150 MG  tablet Take 150 mg by mouth 2 (two) times daily.    . sacubitril-valsartan (ENTRESTO) 49-51 MG Take 1 tablet by mouth 2 (two) times daily. 60 tablet 11   No current facility-administered medications for this encounter.     Vitals:   12/04/16 1005  BP: 112/80  Pulse: 74  SpO2: 95%   Wt Readings from Last 3 Encounters:  11/26/16 251 lb 12.3 oz (114.2 kg)  11/19/16 252 lb 4 oz (114.4 kg)  11/18/16 251 lb 12.8 oz (114.2 kg)     PHYSICAL EXAM: General: Well appearing. No resp difficulty. HEENT: normal Neck: supple. No JVP. Carotids 2+ bilat; no bruits. No thyromegaly or nodule noted. Cor: PMI nondisplaced. Regular rate and rhythm.  +S4. No M/G/R noted Lungs: CTAB, normal effort. Abdomen: soft, non-tender, distended, no HSM. No bruits or masses. +BS  Extremities: no cyanosis, clubbing, rash, Trace ankle edema.  Neuro: alert & orientedx3, cranial nerves grossly intact. moves all 4 extremities w/o difficulty. Affect pleasant   ASSESSMENT & PLAN:  1) Chronic combined systolic/diastolic heart failure with recovered EF, NICM s/p ICD likely r/t HTN, last echo 1/16 EF 25-30%. Cath 8/16 EF 35% No CAD. Echo 3/17 EF 55-60%, Grade 1 DD - NYHA II.  - Volume status stable.  - Continue torsemide 40mg  daily, can continue to cut dose in half as needed. As her diet progresses we can reassess her diuretic requirement.  - Continue hydralazine 75mg  BID.  - Continue Imdur 60 mg daily - Continue Spiro 25mg  daily.  - Continue Entresto 49/51 mg BID.  - Continue carvedilol 25 bid. 2) OSA  -  Continues to use CPAP  3) HTN - Well controlled on current regimen.  4) Asthma/chronic bronchitis - Stable.  5) Obesity: - S/p laparoscopic sleeve gastrectomy with hiatal hernia repair, upper endoscopy  Follow up in 4 weeks with Dr. Haroldine Laws as scheduled.    [name], NP-C 10:10 AM

## 2016-12-07 ENCOUNTER — Encounter (HOSPITAL_COMMUNITY): Payer: Self-pay | Admitting: Emergency Medicine

## 2016-12-07 ENCOUNTER — Emergency Department (HOSPITAL_COMMUNITY)
Admission: EM | Admit: 2016-12-07 | Discharge: 2016-12-07 | Disposition: A | Discharge status: Home / Self Care | Payer: BLUE CROSS/BLUE SHIELD | Attending: Emergency Medicine | Admitting: Emergency Medicine

## 2016-12-07 DIAGNOSIS — S39012A Strain of muscle, fascia and tendon of lower back, initial encounter: Secondary | ICD-10-CM | POA: Diagnosis not present

## 2016-12-07 DIAGNOSIS — Y999 Unspecified external cause status: Secondary | ICD-10-CM | POA: Insufficient documentation

## 2016-12-07 DIAGNOSIS — Y9389 Activity, other specified: Secondary | ICD-10-CM | POA: Insufficient documentation

## 2016-12-07 DIAGNOSIS — I110 Hypertensive heart disease with heart failure: Secondary | ICD-10-CM | POA: Diagnosis not present

## 2016-12-07 DIAGNOSIS — J45909 Unspecified asthma, uncomplicated: Secondary | ICD-10-CM | POA: Diagnosis not present

## 2016-12-07 DIAGNOSIS — I5032 Chronic diastolic (congestive) heart failure: Secondary | ICD-10-CM | POA: Insufficient documentation

## 2016-12-07 DIAGNOSIS — M545 Low back pain, unspecified: Secondary | ICD-10-CM

## 2016-12-07 DIAGNOSIS — Y929 Unspecified place or not applicable: Secondary | ICD-10-CM | POA: Diagnosis not present

## 2016-12-07 DIAGNOSIS — S3992XA Unspecified injury of lower back, initial encounter: Secondary | ICD-10-CM | POA: Diagnosis present

## 2016-12-07 DIAGNOSIS — Z950 Presence of cardiac pacemaker: Secondary | ICD-10-CM | POA: Insufficient documentation

## 2016-12-07 DIAGNOSIS — X501XXA Overexertion from prolonged static or awkward postures, initial encounter: Secondary | ICD-10-CM | POA: Diagnosis not present

## 2016-12-07 MED ORDER — METHOCARBAMOL 500 MG PO TABS: 500.0000 mg | ORAL_TABLET | Freq: Two times a day (BID) | ORAL | 0 refills | Status: DC

## 2016-12-07 MED ORDER — DIAZEPAM 5 MG PO TABS
5.0000 mg | ORAL_TABLET | Freq: Once | ORAL | Status: AC
  Administered 2016-12-07: 5 mg via ORAL
  Filled 2016-12-07: qty 1

## 2016-12-07 MED ORDER — DIAZEPAM 5 MG/ML IJ SOLN
5.0000 mg | Freq: Once | INTRAMUSCULAR | Status: DC
  Filled 2016-12-07: qty 2

## 2016-12-07 NOTE — ED Triage Notes (Signed)
Patient states that she bent over on Wed night picking up blanket when she stood up felt shooting pain in lower back.  Patient states that pain has been there since and then when helping child in bathroom last night had more shooting pains.  Patient states that she has gastric surgery done and can only take Tylenol and not relieving pain.

## 2016-12-07 NOTE — ED Provider Notes (Signed)
[facility] DEPT Provider Note   CSN: [id number] Arrival date & time: 12/07/16  0903     History   Chief Complaint Chief Complaint  Patient presents with  . Back Pain    HPI [name] is a 41 y.o. female.  HPI  41 y.o. female with a hx of Atrial Fibrillation, CHF, HTN, presents to the Emergency Department today due to lower back pain. Notes bending over on Wednesday night to pick up an object and felt shooting pains in lower back. Patient symptoms improved gradually until Saturday when she picked up a child and felt the pain again in the same location. States pain has been there since that evening. No N/V. No dysuria. No loss of bowel or bladder function. No saddle anesthesia. Rates pain 5/10. Worse with movement. Minimal pain at rest. Pt unable to take pill medications as she had recent gastric sleeve done and tylenol not helping. No other symptoms noted.   Past Medical History:  Diagnosis Date  . Acute bronchitis and bronchiolitis   . Anemia    Iron supplements in the past  . Asthma    Inhaler prn;triggered by seasonal changes mostly cold weathert;last asthma  attack years ago  . Atrial fibrillation ([place])   . CHF (congestive heart failure) ([place]) 01/2006   Has Pacemaker and Defibrillator;was on heart meds;no longer taking since pregancy  . CHF (congestive heart failure) ([place])   . Hypertension    currently on Labetalol 200mg  BID  . Hypertension   . Infection    UTI;not frequent;last infection was in June  . Infection    Yeast;not frequent  . Migraines    Can't take Procardia XL, causes migraines  . Other primary cardiomyopathies   . Paroxysmal ventricular tachycardia ([place])   . Varicose veins 1998   Developed during last pregnancy    Patient Active Problem List   Diagnosis Date Noted  . 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 CAP (community acquired pneumonia) 09/10/2016  . Genetic testing 07/12/2016  . Family history of ovarian cancer 05/27/2016  .  Family history of breast cancer in female 05/27/2016  . Pre-operative cardiovascular examination 12/11/2015  . Cough variant asthma vs UACS 11/19/2015  . Chronic diastolic heart failure ([place]) 10/29/2015  . Dyspnea 10/07/2015  . Chest pain 05/04/2015  . Bradycardia 05/04/2015  . Severe obesity (BMI >= 40) ([place]) 02/27/2014  . OSA (obstructive sleep apnea) 01/25/2014  . Chronic bronchitis ([place]) 01/25/2014  . CHF (congestive heart failure) ([place]) 12/08/2013  . Uncontrolled hypertension 12/08/2013  . Hypertensive crisis 12/12/2012  . Supervision of high-risk pregnancy 06/16/2012  . Low lying placenta with hemorrhage, antepartum 06/16/2012  . Previous cesarean delivery, delivered, with or without mention of antepartum condition 06/16/2012  . undesired fertility 06/16/2012  . History of pre-eclampsia in prior pregnancy, currently pregnant 06/02/2012  . Marginal placenta previa 05/24/2012  . Vaginal bleeding in pregnancy 05/24/2012  . Bilateral ovarian cysts 05/24/2012  . Anemia 05/05/2012  . BV (bacterial vaginosis) 04/04/2012  . Pacemaker 04/04/2012  . Cardiac defibrillator in situ 04/04/2012  . Hypertension complicating pregnancy 24/40/1027  . Cardiomyopathy, hypertensive ([place]) 02/03/2012  . Dizziness 12/17/2011  . Chronic systolic heart failure ([place]) 04/13/2011  . Hypertension 03/05/2011  . VENTRICULAR TACHYCARDIA, PAROXYSMAL 10/02/2010  . CARDIOMYOPATHY 08/27/2009  . Atrial fibrillation ([place]) 08/27/2009  . CHF 08/27/2009    Past Surgical History:  Procedure Laterality Date  . BREAST REDUCTION SURGERY  03/05/1994   d/t back, shoulder, and neck pain (42 F)  .  CARDIAC CATHETERIZATION     EF 30-35%  . CARDIAC CATHETERIZATION N/A 05/04/2015   Procedure: Left Heart Cath and Coronary Angiography;  Surgeon: Belva Crome, MD;  Location: [facility];  Service: Cardiovascular;  Laterality: N/A;  . CARDIAC DEFIBRILLATOR PLACEMENT    . CESAREAN SECTION  1998   d/t preeclampsia  .  IMPLANTABLE CARDIOVERTER DEFIBRILLATOR (ICD) GENERATOR CHANGE N/A 09/10/2014   Procedure: ICD GENERATOR CHANGE;  Surgeon: Evans Lance, MD;  Location: [facility] CATH LAB;  Service: Cardiovascular;  Laterality: N/A;  . INSERT / REPLACE / REMOVE PACEMAKER   02/10/2006   Status post implantable cardioverter-defibrillator insertion  .Marland KitchenMarland KitchenMarland KitchenThe Medtronic Maximo VR, model 7232, single chamber  . LAPAROSCOPIC GASTRIC SLEEVE RESECTION WITH HIATAL HERNIA REPAIR N/A 11/24/2016   Procedure: LAPAROSCOPIC GASTRIC SLEEVE RESECTION WITH HIATAL HERNIA REPAIR, UPPER ENDO;  Surgeon: Greer Pickerel, MD;  Location: [facility];  Service: General;  Laterality: N/A;  . LAPAROSCOPIC OVARIAN CYSTECTOMY Bilateral 11/24/2016   Procedure: LAPAROSCOPIC BILATERAL OVARIAN CYSTECTOMY;  Surgeon: Janyth Pupa, DO;  Location: [facility];  Service: Gynecology;  Laterality: Bilateral;    OB History    Gravida Para Term Preterm AB Living   2 1   1   1    SAB TAB Ectopic Multiple Live Births           1       Home Medications    Prior to Admission medications   Medication Sig Start Date End Date Taking? Authorizing Provider  acetaminophen (TYLENOL) 500 MG tablet Take 1,000 mg by mouth every 6 (six) hours as needed for mild pain, moderate pain or headache.     [provider]  albuterol (PROVENTIL HFA;VENTOLIN HFA) 108 (90 Base) MCG/ACT inhaler Inhale 2 puffs into the lungs every 6 (six) hours as needed for wheezing or shortness of breath. Rescue inhaler 09/12/15   Bensimhon, Shaune Pascal, MD  amLODipine (NORVASC) 2.5 MG tablet Take 1 tablet (2.5 mg total) by mouth daily. 12/04/16   [name], NP  calcium carbonate (OSCAL) 1500 (600 Ca) MG TABS tablet Take 600 mg of elemental calcium by mouth 3 (three) times daily with meals.    [provider]  carvedilol (COREG) 25 MG tablet Take 1 tablet (25 mg total) by mouth 2 (two) times daily with a meal. 12/04/16   [name], NP  cetirizine (ZYRTEC) 10 MG tablet Take 10 mg by mouth daily as  needed for allergies.     [provider]  ferrous sulfate 325 (65 FE) MG tablet Take 1 tablet (325 mg total) by mouth 3 (three) times daily with meals. 10/08/15   Tanda Rockers, MD  HYDROcodone-acetaminophen (HYCET) 7.5-325 mg/15 ml solution Take 10 mLs by mouth every 4 (four) hours as needed for moderate pain. 11/26/16   Greer Pickerel, MD  isosorbide mononitrate (IMDUR) 30 MG 24 hr tablet Take 1 tablet (30 mg total) by mouth daily. 12/04/16 12/04/17  [name], NP  ranitidine (ZANTAC) 150 MG tablet Take 150 mg by mouth 2 (two) times daily.    [provider]  sacubitril-valsartan (ENTRESTO) 49-51 MG Take 1 tablet by mouth 2 (two) times daily. 12/04/16   [name], NP  torsemide (DEMADEX) 20 MG tablet Take 2 tablets (40 mg total) by mouth daily. 12/04/16   [name], NP    Family History Family History  Problem Relation Age of Onset  . Sickle cell trait Daughter   . Other Father  Varicose veins  . Hypertension Father   . Diabetes Father        Controlled w/ diet and exercise  . Colon polyps Father        approx 3-4  . Asthma Daughter   . Seizures Maternal Uncle   . Migraines Mother   . Ulcers Mother   . Rheum arthritis Mother   . Colon polyps Mother        approx 2 polyps  . Migraines Daughter   . Migraines Cousin        Maternal  . Uterine cancer Paternal Grandmother        d. 60-61  . Prostate cancer Maternal Uncle        dx 69-70; surgery and rad  . Lung cancer Maternal Uncle 65       d. 69-70; smoker  . Lupus Maternal Aunt   . Hypertension Paternal Aunt   . Other Daughter        blood transfusion @ birth;platelets were low  . Diabetes Maternal Aunt   . Lung cancer Maternal Aunt        dx 71-72; +chemo  . Diabetes Paternal Aunt        x 2  . Diabetes Paternal Uncle   . Hypertension Unknown   . Heart disease Unknown   . Cancer Cousin        maternal 1st cousin d. early 51s; NOS cancer  . Breast cancer Cousin        maternal 1st  cousin dx 56-48  . Breast cancer Other        maternal great aunt (MGM's sister) d. breast cancer in her late 52s  . Kidney cancer Cousin        paternal 1st cousin dx 50s; nephrectomy; not a smoker  . Ovarian cancer Paternal Aunt 92       paternal aunt (father's maternal half-sister)  . Breast cancer Paternal Aunt   . Ovarian cancer Paternal Aunt   . Uterine cancer Paternal Aunt   . Cancer Other        paternal great aunt (PGM's sister) dx NOS cancer  . Cancer Other        paternal great uncle (PGM's brother) dx NOS cancer  . Colon cancer Neg Hx     Social History Social History  Substance Use Topics  . Smoking status: Never Smoker  . Smokeless tobacco: Never Used  . Alcohol use No     Allergies   Percocet [oxycodone-acetaminophen]   Review of Systems Review of Systems ROS reviewed and all are negative for acute change except as noted in the HPI.  Physical Exam Updated Vital Signs BP 90/62 (BP Location: Right Arm)   Pulse 80   Temp 98 F (36.7 C) (Oral)   Resp 14   Ht 5\' 2"  (1.575 m)   Wt 105.7 kg   LMP 10/26/2016   SpO2 95%   BMI 42.62 kg/m   Physical Exam  Constitutional: She is oriented to person, place, and time. Vital signs are normal. She appears well-developed and well-nourished.  HENT:  Head: Normocephalic and atraumatic.  Right Ear: Hearing normal.  Left Ear: Hearing normal.  Eyes: Conjunctivae and EOM are normal. Pupils are equal, round, and reactive to light.  Neck: Normal range of motion. Neck supple.  Cardiovascular: Normal rate and regular rhythm.   Pulmonary/Chest: Effort normal and breath sounds normal.  Abdominal: Soft. There is no tenderness.  Musculoskeletal:  TTP left lower lumbar musculature.  No midline spinous process tenderness. No palpable or visible deformities.   Neurological: She is alert and oriented to person, place, and time.  Skin: Skin is warm and dry.  Psychiatric: She has a normal mood and affect. Her speech is normal  and behavior is normal. Thought content normal.  Nursing note and vitals reviewed.    ED Treatments / Results  Labs (all labs ordered are listed, but only abnormal results are displayed) Labs Reviewed - No data to display  EKG  EKG Interpretation None       Radiology No results found.  Procedures Procedures (including critical care time)  Medications Ordered in ED Medications - No data to display   Initial Impression / Assessment and Plan / ED Course  I have reviewed the triage vital signs and the nursing notes.  Pertinent labs & imaging results that were available during my care of the patient were reviewed by me and considered in my medical decision making (see chart for details).  Final Clinical Impressions(s) / ED Diagnoses     {I have reviewed the relevant previous healthcare records.  {I obtained HPI from historian.   ED Course:  Assessment: Patient is a 41 y.o. female who presents to the ED with back pain. Likely lumbar muscle strain. No neurological deficits appreciated. Patient is ambulatory. No warning symptoms of back pain including: fecal incontinence, urinary retention or overflow incontinence, night sweats, waking from sleep with back pain, unexplained fevers or weight loss, h/o cancer, IVDU, recent trauma. No concern for cauda equina, epidural abscess, or other serious cause of back pain. Conservative measures such as rest, ice/heat and pain medicine indicated with PCP follow-up if no improvement with conservative management.  Disposition/Plan:  DC Home Additional Verbal discharge instructions given and discussed with patient.  Pt Instructed to f/u with PCP in the next week for evaluation and treatment of symptoms. Return precautions given Pt acknowledges and agrees with plan  Supervising Physician Gareth Morgan, MD  Final diagnoses:  Acute left-sided low back pain without sciatica  Strain of lumbar region, initial encounter    New  Prescriptions New Prescriptions   No medications on file     Shary Decamp, Hershal Coria 12/07/16 1245    Gareth Morgan, MD 12/09/16 1306

## 2016-12-07 NOTE — Discharge Instructions (Signed)
Please read and follow all provided instructions.  Your diagnoses today include:  1. Acute left-sided low back pain without sciatica   2. Strain of lumbar region, initial encounter     Tests performed today include: Vital signs - see below for your results today  Medications prescribed:   Take any prescribed medications only as directed.  Home care instructions:  Follow any educational materials contained in this packet Please rest, use ice or heat on your back for the next several days Do not lift, push, pull anything more than 10 pounds for the next week  Follow-up instructions: Please follow-up with your primary care provider in the next 1 week for further evaluation of your symptoms.   Return instructions:  SEEK IMMEDIATE MEDICAL ATTENTION IF YOU HAVE: New numbness, tingling, weakness, or problem with the use of your arms or legs Severe back pain not relieved with medications Loss control of your bowels or bladder Increasing pain in any areas of the body (such as chest or abdominal pain) Shortness of breath, dizziness, or fainting.  Worsening nausea (feeling sick to your stomach), vomiting, fever, or sweats Any other emergent concerns regarding your health   Additional Information:  Your vital signs today were: BP 90/62 (BP Location: Right Arm)    Pulse 80    Temp 98 F (36.7 C) (Oral)    Resp 14    Ht 5\' 2"  (1.575 m)    Wt 105.7 kg    LMP 10/26/2016    SpO2 95%    BMI 42.62 kg/m  If your blood pressure (BP) was elevated above 135/85 this visit, please have this repeated by your doctor within one month. --------------

## 2016-12-08 ENCOUNTER — Encounter: Payer: Self-pay | Admitting: Skilled Nursing Facility1

## 2016-12-08 ENCOUNTER — Encounter: Payer: BLUE CROSS/BLUE SHIELD | Attending: General Surgery | Admitting: Skilled Nursing Facility1

## 2016-12-08 DIAGNOSIS — Z713 Dietary counseling and surveillance: Secondary | ICD-10-CM | POA: Diagnosis not present

## 2016-12-08 DIAGNOSIS — E669 Obesity, unspecified: Secondary | ICD-10-CM

## 2016-12-08 NOTE — Progress Notes (Signed)
Bariatric Class:  Appt start time: 1530 end time:  1630.  2 Week Post-Operative Nutrition Class  Patient was seen on 12/08/2016 for Post-Operative Nutrition education at [facility].   Surgery date: May 5th Surgery type: Sleeve Gastrectomy  Start weight at [facility]: 257.14 pounds Weight today: 234.2 pounds  TANITA  BODY COMP RESULTS  09/14/2016 12/08/2016   BMI (kg/m^2) 47.6 42.8   Fat Mass (lbs) 133.6 112.6   Fat Free Mass (lbs) 122.4 121.6    The following the learning objectives were met by the patient during this course:  Identifies Phase 3A (Soft, High Proteins) Dietary Goals and will begin from 2 weeks post-operatively to 2 months post-operatively  Identifies appropriate sources of fluids and proteins   States protein recommendations and appropriate sources post-operatively  Identifies the need for appropriate texture modifications, mastication, and bite sizes when consuming solids  Identifies appropriate multivitamin and calcium sources post-operatively  Describes the need for physical activity post-operatively and will follow MD recommendations  States when to call healthcare provider regarding medication questions or post-operative complications  Handouts given during class include:  Phase 3A: Soft, High Protein Diet Handout  Follow-Up Plan: Patient will follow-up at [facility] in 6 weeks for 2 month post-op nutrition visit for diet advancement per MD.

## 2016-12-22 ENCOUNTER — Other Ambulatory Visit: Payer: Self-pay | Admitting: Internal Medicine

## 2017-01-06 MED FILL — AMLODIPINE BESYLATE 2.5 MG: 2.5 | 30 days supply | Qty: 30 | Fill #1

## 2017-01-06 MED FILL — ISOSORBIDE MN ER 30 MG TAB: 30 | 30 days supply | Qty: 30 | Fill #1

## 2017-01-07 ENCOUNTER — Encounter (HOSPITAL_COMMUNITY): Payer: Self-pay

## 2017-01-08 MED FILL — ONDANSETRON ODT 4 MG TABLET: 4 | 5 days supply | Qty: 20 | Fill #0

## 2017-01-13 ENCOUNTER — Ambulatory Visit (HOSPITAL_COMMUNITY)
Admission: RE | Admit: 2017-01-13 | Discharge: 2017-01-13 | Disposition: A | Discharge status: Home / Self Care | Payer: 59 | Source: Ambulatory Visit | Attending: Internal Medicine | Admitting: Internal Medicine

## 2017-01-13 ENCOUNTER — Encounter (HOSPITAL_COMMUNITY): Payer: Self-pay | Admitting: Internal Medicine

## 2017-01-13 VITALS — BP 128/84 | HR 73 | Wt 220.0 lb

## 2017-01-13 DIAGNOSIS — Z95810 Presence of automatic (implantable) cardiac defibrillator: Secondary | ICD-10-CM | POA: Diagnosis not present

## 2017-01-13 DIAGNOSIS — R634 Abnormal weight loss: Secondary | ICD-10-CM | POA: Diagnosis not present

## 2017-01-13 DIAGNOSIS — K5900 Constipation, unspecified: Secondary | ICD-10-CM | POA: Insufficient documentation

## 2017-01-13 DIAGNOSIS — I429 Cardiomyopathy, unspecified: Secondary | ICD-10-CM | POA: Insufficient documentation

## 2017-01-13 DIAGNOSIS — I5022 Chronic systolic (congestive) heart failure: Secondary | ICD-10-CM | POA: Diagnosis not present

## 2017-01-13 DIAGNOSIS — Z9889 Other specified postprocedural states: Secondary | ICD-10-CM | POA: Insufficient documentation

## 2017-01-13 DIAGNOSIS — I110 Hypertensive heart disease with heart failure: Secondary | ICD-10-CM | POA: Diagnosis present

## 2017-01-13 DIAGNOSIS — G4733 Obstructive sleep apnea (adult) (pediatric): Secondary | ICD-10-CM | POA: Insufficient documentation

## 2017-01-13 DIAGNOSIS — I5032 Chronic diastolic (congestive) heart failure: Secondary | ICD-10-CM

## 2017-01-13 DIAGNOSIS — J45909 Unspecified asthma, uncomplicated: Secondary | ICD-10-CM | POA: Diagnosis not present

## 2017-01-13 DIAGNOSIS — I4891 Unspecified atrial fibrillation: Secondary | ICD-10-CM | POA: Diagnosis not present

## 2017-01-13 DIAGNOSIS — I5042 Chronic combined systolic (congestive) and diastolic (congestive) heart failure: Secondary | ICD-10-CM | POA: Insufficient documentation

## 2017-01-13 DIAGNOSIS — E669 Obesity, unspecified: Secondary | ICD-10-CM | POA: Insufficient documentation

## 2017-01-13 DIAGNOSIS — Z9884 Bariatric surgery status: Secondary | ICD-10-CM | POA: Insufficient documentation

## 2017-01-13 NOTE — Addendum Note (Signed)
Encounter addended by: Scarlette Calico, RN on: 01/13/2017  3:49 PM<BR>    Actions taken: Sign clinical note, Visit diagnoses modified, Order list changed, Diagnosis association updated

## 2017-01-13 NOTE — Patient Instructions (Signed)
Your physician has requested that you have an echocardiogram. Echocardiography is a painless test that uses sound waves to create images of your heart. It provides your doctor with information about the size and shape of your heart and how well your heart's chambers and valves are working. This procedure takes approximately one hour. There are no restrictions for this procedure.  We will contact you in 6 months to schedule your next appointment.  

## 2017-01-13 NOTE — Addendum Note (Signed)
Encounter addended by: Scarlette Calico, RN on: 01/13/2017  4:02 PM<BR>    Actions taken: Sign clinical note

## 2017-01-13 NOTE — Progress Notes (Signed)
Medication Samples have been provided to the patient.  Drug name: Delene Loll       Strength: 49/51mg         Qty: 2  LOT: [id number]  Exp.Date: 10/19  Dosing instructions: 1 tab Twice daily   The patient has been instructed regarding the correct time, dose, and frequency of taking this medication, including desired effects and most common side effects.   Inari Shin 4:01 PM 01/13/2017

## 2017-01-13 NOTE — Progress Notes (Signed)
Patient ID: [name], female   DOB: 1976-07-01, 41 y.o.   MRN: [id number]    Advanced Heart Failure Clinic Note   PCP: Dr. Heath Gold  OB:  Dr. Leo Grosser  EP: Dr. Lovena Le CHF: Bensimhon  HPI: [name] is a 42 y.o. female with h/o obesity, chronic systolic heart failure due to nonischemic cardiomyopathy (cath 2007. No CAD) s/p Medtronic ICD implantation, asthma/chronic bronchitis and severe hypertension.   ECHOs 04/27/2011 45-50%.   08/20/11 ECHO 55-60%   05/19/12 EF ~40%   07/01/12 EF ~40%   08/02/12 EF~40%    2/14 EF ~50%                          5/14 EF ~35%                          1/16 EF 25-30%                           3/17 EF 55-60% Grade I DD                          CPX 01/2014: Resting HR 75, Peak HR 121 Peak VO2 15.2, 74.4% predicted VE/VCO2 slope: 26.4 OUES: 1.93 Peak RER 1.15 VE/MVV 61.7%  Seen in ED 05/04/16 was having atypical chest pain and jaw pain. Thought to be mildly volume overloaded.   Admitted 11/24/16-11/26/16 for Laparoscopic Sleeve Gastrectomy with hiatal hernia repair, upper endoscopy. Discharge weight was 251 pounds.   Returns today for heart failure follow up. Feeling well after her surgery. Now down 33 pounds. Gets nauseated when eating. Struggling with constipation. SBP 110-115. She has cut hydralazine to 50 TID and Imdur to 30 daily. Has stopped. Torsemide down from 80 to 40. Usually no swelling. No orthopnea or PND. Goes to gym 5 days per week doing TM and elliptical.    SH: Married. Lives in [place], 2 children. Working at [employer]. No ETOH or smoking  FH: Mother living: DM2, HTN, no CAD        Father living: DM2, HTN  Review of systems complete and found to be negative unless listed in HPI.    Past Medical History:  Diagnosis Date  . Acute bronchitis and bronchiolitis   . Anemia    Iron supplements in the past  . Asthma    Inhaler prn;triggered by seasonal changes mostly cold weathert;last asthma  attack years ago  . Atrial  fibrillation ([place])   . CHF (congestive heart failure) ([place]) 01/2006   Has Pacemaker and Defibrillator;was on heart meds;no longer taking since pregancy  . CHF (congestive heart failure) ([place])   . Hypertension    currently on Labetalol 200mg  BID  . Hypertension   . Infection    UTI;not frequent;last infection was in June  . Infection    Yeast;not frequent  . Migraines    Can't take Procardia XL, causes migraines  . Other primary cardiomyopathies   . Paroxysmal ventricular tachycardia ([place])   . Varicose veins 1998   Developed during last pregnancy    Current Outpatient Prescriptions  Medication Sig Dispense Refill  . acetaminophen (TYLENOL) 500 MG tablet Take 1,000 mg by mouth every 6 (six) hours as needed for mild pain, moderate pain or headache.     . albuterol (PROVENTIL HFA;VENTOLIN HFA) 108 (90 Base) MCG/ACT inhaler Inhale 2  puffs into the lungs every 6 (six) hours as needed for wheezing or shortness of breath. Rescue inhaler 1 Inhaler 1  . amLODipine (NORVASC) 2.5 MG tablet Take 1 tablet (2.5 mg total) by mouth daily. 30 tablet 6  . calcium carbonate (OSCAL) 1500 (600 Ca) MG TABS tablet Take 600 mg of elemental calcium by mouth 3 (three) times daily with meals.    . carvedilol (COREG) 25 MG tablet Take 1 tablet (25 mg total) by mouth 2 (two) times daily with a meal. 60 tablet 6  . cetirizine (ZYRTEC) 10 MG tablet Take 10 mg by mouth daily as needed for allergies.     . ferrous sulfate 325 (65 FE) MG tablet Take 1 tablet (325 mg total) by mouth 3 (three) times daily with meals. 90 tablet 3  . hydrALAZINE (APRESOLINE) 50 MG tablet Take 50 mg by mouth 2 (two) times daily.    . isosorbide mononitrate (IMDUR) 30 MG 24 hr tablet Take 1 tablet (30 mg total) by mouth daily. 30 tablet 3  . ranitidine (ZANTAC) 150 MG tablet Take 150 mg by mouth 2 (two) times daily.    . sacubitril-valsartan (ENTRESTO) 49-51 MG Take 1 tablet by mouth 2 (two) times daily. 60 tablet 11  . torsemide (DEMADEX) 20  MG tablet Take 2 tablets (40 mg total) by mouth daily. 60 tablet 3  . HYDROcodone-acetaminophen (HYCET) 7.5-325 mg/15 ml solution Take 10 mLs by mouth every 4 (four) hours as needed for moderate pain. (Patient not taking: Reported on 12/07/2016) 120 mL 0   No current facility-administered medications for this encounter.     Vitals:   01/13/17 1521  BP: 128/84  Pulse: 73  SpO2: 98%  Weight: 220 lb (99.8 kg)   Wt Readings from Last 3 Encounters:  01/13/17 220 lb (99.8 kg)  12/08/16 234 lb 4.8 oz (106.3 kg)  12/07/16 233 lb (105.7 kg)     PHYSICAL EXAM: General:  Well appearing. No resp difficulty HEENT: normal Neck: supple. JVP hard to see Carotids 2+ bilat; no bruits. No lymphadenopathy or thryomegaly appreciated. Cor: PMI nondisplaced. Regular rate & rhythm. No rubs, gallops or murmurs. Lungs: clear Abdomen: soft, nontender, mildly distended. No hepatosplenomegaly. No bruits or masses. Good bowel sounds. Extremities: no cyanosis, clubbing, rash, 1+ edema Neuro: alert & orientedx3, cranial nerves grossly intact. moves all 4 extremities w/o difficulty. Affect pleasant  ICD interrogated personally in clinic  No VT/AF Optivol mildly elevated but not above threshold. 3 hours of activity per day  ASSESSMENT & PLAN:  1) Chronic combined systolic/diastolic heart failure with recovered EF, NICM s/p ICD likely r/t HTN, last echo 1/16 EF 25-30%. Cath 8/16 EF 35% No CAD. Echo 3/17 EF 55-60%, Grade 1 DD - NYHA II.  - Volume status mildly elevated on exam and ICD interrogation  take extra torsemide as needed - Continue torsemide 40mg  daily, can adjust as needed - Continue hydralazine 50 mg BID and cut to 25 bid as BP falls with weight loss - Continue Imdur 30 mg daily - Off spiro  - Continue Entresto 49/51 mg BID.  - Continue carvedilol 25 bid. 2) OSA  -  Continues to use CPAP  3) HTN - BP improving with weight loss. Adjust as above.  - Goal SBP is 110-130. If BP is lower, wean  hydralazine and Imdur then can wean carvedilol next.  4) Asthma/chronic bronchitis - Stable.  5) Obesity: - S/p laparoscopic sleeve gastrectomy with hiatal hernia repair, upper endoscopy - Has lost  30 pounds. Continues to do well  Repeat echo. F/u 6 months.   Glori Bickers MD 3:35 PM

## 2017-01-14 ENCOUNTER — Telehealth (HOSPITAL_COMMUNITY): Payer: Self-pay | Admitting: Pharmacist

## 2017-01-14 NOTE — Telephone Encounter (Signed)
PA for entresto approved by [employer] effective 01/14/17 - 01/14/18.   Carlean Jews, Pharm.D. PGY1 Pharmacy Resident 6/21/20183:49 PM Pager [phone]

## 2017-01-19 ENCOUNTER — Encounter: Payer: 59 | Attending: General Surgery | Admitting: Skilled Nursing Facility1

## 2017-01-19 ENCOUNTER — Encounter: Payer: Self-pay | Admitting: Skilled Nursing Facility1

## 2017-01-19 DIAGNOSIS — Z713 Dietary counseling and surveillance: Secondary | ICD-10-CM | POA: Diagnosis not present

## 2017-01-19 DIAGNOSIS — I509 Heart failure, unspecified: Secondary | ICD-10-CM

## 2017-01-19 NOTE — Patient Instructions (Addendum)
-  Try the alkaline water again pH 8.8  -Eat your protein first then your fruit or vegetable  -Only use nuts for one snack

## 2017-01-19 NOTE — Progress Notes (Signed)
   Follow-up visit:  8 Weeks Post-Operative Sleeve Gastrectomy Surgery  Medical Nutrition Therapy:  Appt start time: 7:23 end time:  8:15  Primary concerns today: Post-operative Bariatric Surgery Nutrition Management.  Pt states she struggles getting in the protein shake because she is over them which is why it takes so long to drink them but drinks them because they are easy. Pt states hamburger steak caused vomiting. Pt states she was given Zofran because when she eats she gets nauseous: while eating. Pt states she cannot eat spicy foods due to nausea and stomach pain.  Pt states her blood pressure has been 100/70. Pt states her A1C was 6.7 and now is 6.0. Pt states she can only have 64 fluid ounces due to CHF. Pt states her energy level has gotten better but still feeling tired. Pt states her sleep has been a struggle with about 5 hours nightly. Pt states she was craving a sandwich so she ate Protein wheat bread to make a Sandwich with no issue. Pt states she Travels with a tuna pouch and a cooler. Pt states she wants to eat nuts: Dietitian educated the pt on this topic. Pt states she chews until applesauce consistency takes small bites and moisten her meats but still struggles with nausea.  Pt requested to have fruit added back into her diet: Dietitian educated the pt on this addition.   Surgery date: May 5th Surgery type: Sleeve Gastrectomy  Start weight at [facility]: 257.14 pounds Weight today: 216.8 pounds Weight Change: 17.4 lbs  TANITA  BODY COMP RESULTS  09/14/2016 12/08/2016 01/19/2017   BMI (kg/m^2) 47.6 42.8 39.7   Fat Mass (lbs) 133.6 112.6 99   Fat Free Mass (lbs) 122.4 121.6 117.8  TBW   85.4   24-hr recall: B (AM): 5 ounces protein shake and 1 ounce water (taking 30 minutes) Snk (AM): finishing the shake L (PM): steak Snk (PM): cheesestick D (PM): 3 pieces of shrimp  Snk (PM):   Fluid intake: water, protein shake, water with flavorings  Estimated total protein intake:  about 60  Medications: Lower dose of blood pressure medication Supplementation: bari advantage and calcium   Using straws: no Drinking while eating: no Having you been chewing well: yes Chewing/swallowing difficulties: no Changes in vision: no Changes to mood/headaches: no Hair loss/Changes to skin/Changes to nails: no Any difficulty focusing or concentrating: no Sweating: no Dizziness/Lightheaded:  Palpitations: no  Carbonated beverages: no N/V/D/C/GAS: significant nausea sometimes resulting in vomiting   Abdominal Pain: no Dumping syndrome: no  Recent physical activity:  4-5 times a week walking and elliptical   Progress Towards Goal(s):  In progress.  Handouts given during visit include:  Ns veggies + protein   Nutritional Diagnosis:  [place]-3.3 Overweight/obesity related to past poor dietary habits and physical inactivity as evidenced by patient w/ recent sleeve gastrectomy surgery following dietary guidelines for continued weight loss.    Intervention:  Nutrition counseling. Dietitian educated the pt on advancing her diet to include non-starchy vegetables Goals: -Try the alkaline water again pH 8.8 -Eat your protein first then your fruit or vegetable -Only use nuts for one snack  Teaching Method Utilized:  Visual Auditory Hands on  Barriers to learning/adherence to lifestyle change: none identified   Demonstrated degree of understanding via:  Teach Back   Monitoring/Evaluation:  Dietary intake, exercise, and body weight.

## 2017-01-28 ENCOUNTER — Other Ambulatory Visit (HOSPITAL_COMMUNITY): Payer: BLUE CROSS/BLUE SHIELD

## 2017-02-01 ENCOUNTER — Other Ambulatory Visit: Payer: Self-pay | Admitting: Internal Medicine

## 2017-02-08 ENCOUNTER — Ambulatory Visit (HOSPITAL_COMMUNITY): Payer: 59 | Attending: Internal Medicine

## 2017-02-08 ENCOUNTER — Other Ambulatory Visit: Payer: Self-pay

## 2017-02-08 DIAGNOSIS — I5022 Chronic systolic (congestive) heart failure: Secondary | ICD-10-CM | POA: Diagnosis present

## 2017-02-08 MED FILL — CARVEDILOL 25 MG TABLET: 25 | 30 days supply | Qty: 60 | Fill #1

## 2017-02-08 MED FILL — AMLODIPINE BESYLATE 2.5 MG: 2.5 | 30 days supply | Qty: 30 | Fill #2

## 2017-02-08 MED FILL — TORSEMIDE 20 MG TABLET: 20 | 30 days supply | Qty: 60 | Fill #1

## 2017-02-08 MED FILL — ENTRESTO 49 MG-51 MG TABLET: 49-51 | 30 days supply | Qty: 60 | Fill #1

## 2017-02-15 ENCOUNTER — Ambulatory Visit: Payer: 59 | Admitting: *Deleted

## 2017-02-16 ENCOUNTER — Encounter: Payer: Self-pay | Admitting: Internal Medicine

## 2017-02-16 ENCOUNTER — Ambulatory Visit (INDEPENDENT_AMBULATORY_CARE_PROVIDER_SITE_OTHER): Payer: 59 | Admitting: Internal Medicine

## 2017-02-16 VITALS — BP 143/95 | HR 68 | Ht 62.0 in | Wt 212.0 lb

## 2017-02-16 DIAGNOSIS — I472 Ventricular tachycardia: Secondary | ICD-10-CM

## 2017-02-16 DIAGNOSIS — I5022 Chronic systolic (congestive) heart failure: Secondary | ICD-10-CM

## 2017-02-16 DIAGNOSIS — I4729 Other ventricular tachycardia: Secondary | ICD-10-CM

## 2017-02-16 NOTE — Patient Instructions (Signed)

## 2017-02-16 NOTE — Progress Notes (Signed)
HPI [name] (previously [place]) returns today for followup. She is a very pleasant 41 yo woman with a h/o a non-ischemic CM, chronic congestive heart failure, s/p ICD implant. She has undergone ICD gen change out about 2 years ago. She has not had syncope or ICD shock. No edema. She has been losing weight and feels better. No ICD shocks. Her most recent EF has normalized. Allergies  Allergen Reactions  . Percocet [Oxycodone-Acetaminophen] Shortness Of Breath    Tolerates acetaminophen     Current Outpatient Prescriptions  Medication Sig Dispense Refill  . albuterol (PROVENTIL HFA;VENTOLIN HFA) 108 (90 Base) MCG/ACT inhaler Inhale 2 puffs into the lungs every 6 (six) hours as needed for wheezing or shortness of breath. Rescue inhaler 1 Inhaler 1  . amLODipine (NORVASC) 2.5 MG tablet Take 1 tablet (2.5 mg total) by mouth daily. 30 tablet 6  . calcium carbonate (OSCAL) 1500 (600 Ca) MG TABS tablet Take 600 mg of elemental calcium by mouth 3 (three) times daily with meals.    . carvedilol (COREG) 25 MG tablet Take 1 tablet (25 mg total) by mouth 2 (two) times daily with a meal. 60 tablet 6  . cetirizine (ZYRTEC) 10 MG tablet Take 10 mg by mouth daily as needed for allergies.     . ferrous sulfate 325 (65 FE) MG tablet Take 1 tablet (325 mg total) by mouth 3 (three) times daily with meals. 90 tablet 3  . ranitidine (ZANTAC) 150 MG tablet Take 150 mg by mouth 2 (two) times daily.    . sacubitril-valsartan (ENTRESTO) 49-51 MG Take 1 tablet by mouth 2 (two) times daily. 60 tablet 11  . torsemide (DEMADEX) 20 MG tablet Take 2 tablets (40 mg total) by mouth daily. 60 tablet 3   No current facility-administered medications for this visit.      Past Medical History:  Diagnosis Date  . Acute bronchitis and bronchiolitis   . Anemia    Iron supplements in the past  . Asthma    Inhaler prn;triggered by seasonal changes mostly cold weathert;last asthma  attack years ago  . Atrial  fibrillation ([place])   . CHF (congestive heart failure) ([place]) 01/2006   Has Pacemaker and Defibrillator;was on heart meds;no longer taking since pregancy  . CHF (congestive heart failure) ([place])   . Hypertension    currently on Labetalol 200mg  BID  . Hypertension   . Infection    UTI;not frequent;last infection was in June  . Infection    Yeast;not frequent  . Migraines    Can't take Procardia XL, causes migraines  . Other primary cardiomyopathies   . Paroxysmal ventricular tachycardia ([place])   . Varicose veins 1998   Developed during last pregnancy    ROS:   All systems reviewed and negative except as noted in the HPI.   Past Surgical History:  Procedure Laterality Date  . BREAST REDUCTION SURGERY  03/05/1994   d/t back, shoulder, and neck pain (42 F)  . CARDIAC CATHETERIZATION     EF 30-35%  . CARDIAC CATHETERIZATION N/A 05/04/2015   Procedure: Left Heart Cath and Coronary Angiography;  Surgeon: Belva Crome, MD;  Location: [facility];  Service: Cardiovascular;  Laterality: N/A;  . CARDIAC DEFIBRILLATOR PLACEMENT    . CESAREAN SECTION  1998   d/t preeclampsia  . IMPLANTABLE CARDIOVERTER DEFIBRILLATOR (ICD) GENERATOR CHANGE N/A 09/10/2014   Procedure: ICD GENERATOR CHANGE;  Surgeon: Evans Lance, MD;  Location: [facility] CATH LAB;  Service: Cardiovascular;  Laterality: N/A;  . INSERT / REPLACE / REMOVE PACEMAKER   02/10/2006   Status post implantable cardioverter-defibrillator insertion  .Marland KitchenMarland KitchenMarland KitchenThe Medtronic Maximo VR, model 7232, single chamber  . LAPAROSCOPIC GASTRIC SLEEVE RESECTION WITH HIATAL HERNIA REPAIR N/A 11/24/2016   Procedure: LAPAROSCOPIC GASTRIC SLEEVE RESECTION WITH HIATAL HERNIA REPAIR, UPPER ENDO;  Surgeon: Greer Pickerel, MD;  Location: [facility];  Service: General;  Laterality: N/A;  . LAPAROSCOPIC OVARIAN CYSTECTOMY Bilateral 11/24/2016   Procedure: LAPAROSCOPIC BILATERAL OVARIAN CYSTECTOMY;  Surgeon: Janyth Pupa, DO;  Location: [facility];  Service: Gynecology;   Laterality: Bilateral;     Family History  Problem Relation Age of Onset  . Sickle cell trait Daughter   . Other Father        Varicose veins  . Hypertension Father   . Diabetes Father        Controlled w/ diet and exercise  . Colon polyps Father        approx 3-4  . Asthma Daughter   . Seizures Maternal Uncle   . Migraines Mother   . Ulcers Mother   . Rheum arthritis Mother   . Colon polyps Mother        approx 2 polyps  . Migraines Daughter   . Migraines Cousin        Maternal  . Uterine cancer Paternal Grandmother        d. 60-61  . Prostate cancer Maternal Uncle        dx 69-70; surgery and rad  . Lung cancer Maternal Uncle 65       d. 69-70; smoker  . Lupus Maternal Aunt   . Hypertension Paternal Aunt   . Other Daughter        blood transfusion @ birth;platelets were low  . Diabetes Maternal Aunt   . Lung cancer Maternal Aunt        dx 71-72; +chemo  . Diabetes Paternal Aunt        x 2  . Diabetes Paternal Uncle   . Hypertension Unknown   . Heart disease Unknown   . Cancer Cousin        maternal 1st cousin d. early 78s; NOS cancer  . Breast cancer Cousin        maternal 1st cousin dx 25-48  . Breast cancer Other        maternal great aunt (MGM's sister) d. breast cancer in her late 49s  . Kidney cancer Cousin        paternal 1st cousin dx 43s; nephrectomy; not a smoker  . Ovarian cancer Paternal Aunt 38       paternal aunt (father's maternal half-sister)  . Breast cancer Paternal Aunt   . Ovarian cancer Paternal Aunt   . Uterine cancer Paternal Aunt   . Cancer Other        paternal great aunt (PGM's sister) dx NOS cancer  . Cancer Other        paternal great uncle (PGM's brother) dx NOS cancer  . Colon cancer Neg Hx      Social History   Social History  . Marital status: Married    Spouse name: Khali Perella  . Number of children: 1  . Years of education: 20   Occupational History  . 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 [place] History Main Topics  . Smoking status: Never Smoker  . Smokeless tobacco: Never Used  . Alcohol use  No  . Drug use: No  . Sexual activity: Yes    Partners: Male    Birth control/ protection: Condom, None   Other Topics Concern  . Not on file   Social History Narrative   ** Merged History Encounter **         BP (!) 143/95   Pulse 68   Ht 5\' 2"  (1.575 m)   Wt 212 lb (96.2 kg)   BMI 38.78 kg/m   Physical Exam:  Well appearing 41 yo woman, NAD HEENT: Unremarkable Neck:  6 cm JVD, no thyromegally Lymphatics:  No adenopathy Back:  No CVA tenderness Lungs:  Clear with no wheezes ,rales, or rhonchi HEART:  Regular rate rhythm, no murmurs, no rubs, no clicks Abd:  soft, positive bowel sounds, no organomegally, no rebound, no guarding Ext:  2 plus pulses, no edema, no cyanosis, no clubbing Skin:  No rashes no nodules Neuro:  CN II through XII intact, motor grossly intact   DEVICE  Normal device function.  See PaceArt for details.   Assess/Plan: 1. ICD - her  medtronic ICD is stable. Her RV threshold is increased but she is not pacing. She has 10 years of battery longevity. 2. Obesity - her weight continues to come down. I have encouraged her to continue to lose weight. 3. Chronic systolic heart failure - her EF has been up and down. She will continue her current meds and maintain a low sodium diet.  Mikle Bosworth.D.

## 2017-02-17 NOTE — Progress Notes (Signed)
Remote ICD transmission.   

## 2017-02-18 ENCOUNTER — Encounter: Payer: Self-pay | Admitting: Cardiology

## 2017-02-22 LAB — CUP PACEART INCLINIC DEVICE CHECK
Battery Remaining Longevity: 122 mo
Brady Statistic RV Percent Paced: 0.01 %
HIGH POWER IMPEDANCE MEASURED VALUE: 41 Ohm
HIGH POWER IMPEDANCE MEASURED VALUE: 53 Ohm
Lead Channel Impedance Value: 285 Ohm
Lead Channel Impedance Value: 361 Ohm
Lead Channel Pacing Threshold Pulse Width: 0.7 ms
Lead Channel Setting Pacing Pulse Width: 0.7 ms
Lead Channel Setting Sensing Sensitivity: 0.3 mV
MDC IDC LEAD IMPLANT DT: 20070718
MDC IDC LEAD LOCATION: 753860
MDC IDC MSMT BATTERY VOLTAGE: 3.01 V
MDC IDC MSMT LEADCHNL RV PACING THRESHOLD AMPLITUDE: 2.5 V
MDC IDC MSMT LEADCHNL RV SENSING INTR AMPL: 17.25 mV
MDC IDC PG IMPLANT DT: 20160215
MDC IDC SESS DTM: 20180724132804
MDC IDC SET LEADCHNL RV PACING AMPLITUDE: 3.5 V

## 2017-03-23 ENCOUNTER — Ambulatory Visit: Payer: BLUE CROSS/BLUE SHIELD | Admitting: Skilled Nursing Facility1

## 2017-03-23 MED FILL — TORSEMIDE 20 MG TABLET: 20 | 30 days supply | Qty: 60 | Fill #2

## 2017-03-23 MED FILL — ENTRESTO 49 MG-51 MG TABLET: 49-51 | 30 days supply | Qty: 60 | Fill #2

## 2017-05-12 MED FILL — TORSEMIDE 20 MG TABLET: 20 | 30 days supply | Qty: 60 | Fill #3

## 2017-05-18 ENCOUNTER — Ambulatory Visit: Payer: Self-pay | Admitting: *Deleted

## 2017-05-20 ENCOUNTER — Encounter: Payer: Self-pay | Admitting: Cardiology

## 2017-05-20 NOTE — Progress Notes (Signed)
Not received  

## 2017-05-24 ENCOUNTER — Ambulatory Visit (INDEPENDENT_AMBULATORY_CARE_PROVIDER_SITE_OTHER): Payer: Self-pay | Admitting: *Deleted

## 2017-05-24 DIAGNOSIS — I4729 Other ventricular tachycardia: Secondary | ICD-10-CM

## 2017-05-24 DIAGNOSIS — I472 Ventricular tachycardia: Secondary | ICD-10-CM

## 2017-05-25 LAB — CUP PACEART REMOTE DEVICE CHECK
Battery Remaining Longevity: 120 mo
Brady Statistic RV Percent Paced: 0.01 %
HIGH POWER IMPEDANCE MEASURED VALUE: 43 Ohm
HighPow Impedance: 56 Ohm
Implantable Lead Implant Date: 20070718
Lead Channel Impedance Value: 304 Ohm
Lead Channel Pacing Threshold Amplitude: 2.5 V
Lead Channel Pacing Threshold Pulse Width: 0.4 ms
Lead Channel Sensing Intrinsic Amplitude: 20 mV
Lead Channel Setting Pacing Amplitude: 3.5 V
Lead Channel Setting Pacing Pulse Width: 0.7 ms
Lead Channel Setting Sensing Sensitivity: 0.3 mV
MDC IDC LEAD LOCATION: 753860
MDC IDC MSMT BATTERY VOLTAGE: 3.01 V
MDC IDC MSMT LEADCHNL RV IMPEDANCE VALUE: 361 Ohm
MDC IDC MSMT LEADCHNL RV SENSING INTR AMPL: 20 mV
MDC IDC PG IMPLANT DT: 20160215
MDC IDC SESS DTM: 20181026233015

## 2017-05-25 NOTE — Progress Notes (Signed)
Remote ICD transmission.   

## 2017-06-01 ENCOUNTER — Encounter: Payer: Self-pay | Admitting: Cardiology

## 2017-07-15 ENCOUNTER — Ambulatory Visit (HOSPITAL_COMMUNITY)
Admission: RE | Admit: 2017-07-15 | Discharge: 2017-07-15 | Disposition: A | Discharge status: Home / Self Care | Payer: Managed Care, Other (non HMO) | Source: Ambulatory Visit | Attending: Cardiology | Admitting: Cardiology

## 2017-07-15 VITALS — BP 162/108 | HR 92 | Wt 196.8 lb

## 2017-07-15 DIAGNOSIS — I110 Hypertensive heart disease with heart failure: Secondary | ICD-10-CM | POA: Insufficient documentation

## 2017-07-15 DIAGNOSIS — I429 Cardiomyopathy, unspecified: Secondary | ICD-10-CM | POA: Diagnosis not present

## 2017-07-15 DIAGNOSIS — E119 Type 2 diabetes mellitus without complications: Secondary | ICD-10-CM | POA: Diagnosis not present

## 2017-07-15 DIAGNOSIS — Z79899 Other long term (current) drug therapy: Secondary | ICD-10-CM | POA: Diagnosis not present

## 2017-07-15 DIAGNOSIS — I5042 Chronic combined systolic (congestive) and diastolic (congestive) heart failure: Secondary | ICD-10-CM | POA: Insufficient documentation

## 2017-07-15 DIAGNOSIS — J45909 Unspecified asthma, uncomplicated: Secondary | ICD-10-CM | POA: Insufficient documentation

## 2017-07-15 DIAGNOSIS — Z950 Presence of cardiac pacemaker: Secondary | ICD-10-CM | POA: Diagnosis not present

## 2017-07-15 DIAGNOSIS — I5022 Chronic systolic (congestive) heart failure: Secondary | ICD-10-CM

## 2017-07-15 DIAGNOSIS — J40 Bronchitis, not specified as acute or chronic: Secondary | ICD-10-CM | POA: Insufficient documentation

## 2017-07-15 DIAGNOSIS — R6884 Jaw pain: Secondary | ICD-10-CM | POA: Insufficient documentation

## 2017-07-15 DIAGNOSIS — I472 Ventricular tachycardia: Secondary | ICD-10-CM | POA: Insufficient documentation

## 2017-07-15 DIAGNOSIS — Z8249 Family history of ischemic heart disease and other diseases of the circulatory system: Secondary | ICD-10-CM | POA: Insufficient documentation

## 2017-07-15 DIAGNOSIS — I4891 Unspecified atrial fibrillation: Secondary | ICD-10-CM | POA: Insufficient documentation

## 2017-07-15 DIAGNOSIS — G4733 Obstructive sleep apnea (adult) (pediatric): Secondary | ICD-10-CM

## 2017-07-15 DIAGNOSIS — E669 Obesity, unspecified: Secondary | ICD-10-CM | POA: Diagnosis not present

## 2017-07-15 MED ORDER — AMLODIPINE BESYLATE 2.5 MG PO TABS: 2.5000 mg | ORAL_TABLET | Freq: Every day | ORAL | 6 refills | Status: DC

## 2017-07-15 MED FILL — AMLODIPINE BESYLATE 2.5 MG: 2.5 | 30 days supply | Qty: 30 | Fill #0

## 2017-07-15 NOTE — Patient Instructions (Signed)
RESTART Amlodipine 2.5 mg tablet once daily.  Return in 2 weeks for BP check.  __________________________________________________________________ [name] Code: 9000  Follow up 3 months.  ___________________________________________________________________ [name] Code: 9002  Take all medication as prescribed the day of your appointment. Bring all medications with you to your appointment.  Do the following things EVERYDAY: 1) Weigh yourself in the morning before breakfast. Write it down and keep it in a log. 2) Take your medicines as prescribed 3) Eat low salt foods-Limit salt (sodium) to 2000 mg per day.  4) Stay as active as you can everyday 5) Limit all fluids for the day to less than 2 liters

## 2017-07-15 NOTE — Progress Notes (Signed)
Patient ID: [name], female   DOB: Apr 09, 1976, 41 y.o.   MRN: [id number]    Advanced Heart Failure Clinic Note   PCP: Dr. Heath Gold  OB:  Dr. Leo Grosser  EP: Dr. Lovena Le CHF: [place]  HPI: [name] is a 41 y.o. female with h/o obesity, chronic systolic heart failure due to nonischemic cardiomyopathy (cath 2007. No CAD) s/p Medtronic ICD implantation, asthma/chronic bronchitis and severe hypertension.   Seen in ED 05/04/16 was having atypical chest pain and jaw pain. Thought to be mildly volume overloaded.   Admitted 11/24/16-11/26/16 for Laparoscopic Sleeve Gastrectomy with hiatal hernia repair, upper endoscopy. Discharge weight was 251 pounds.   Today she returns for HF follow up. Complaining of nasal congestion. Started on levaquin on 12/19. Has been off carvedilol and amlodipine for over a month. Denies SOB/PND/Orthopnea. Taking all medications. Using CPAP most nights but not lately with URI. Working full time.   ECHOs 04/27/2011 45-50%.   08/20/11 ECHO 55-60%   05/19/12 EF ~40%   07/01/12 EF ~40%   08/02/12 EF~40%    2/14 EF ~50%                          5/14 EF ~35%                          1/16 EF 25-30%                           3/17 EF 55-60% Grade I DD   01/2017 EF 60-65%                           CPX 01/2014: Resting HR 75, Peak HR 121 Peak VO2 15.2, 74.4% predicted VE/VCO2 slope: 26.4 OUES: 1.93 Peak RER 1.15 VE/MVV 61.7%  SH: Married. Lives in [place], 2 children. Working at [employer]. No ETOH or smoking  FH: Mother living: DM2, HTN, no CAD        Father living: DM2, HTN  Review of systems complete and found to be negative unless listed in HPI.    Past Medical History:  Diagnosis Date  . Acute bronchitis and bronchiolitis   . Anemia    Iron supplements in the past  . Asthma    Inhaler prn;triggered by seasonal changes mostly cold weathert;last asthma  attack years ago  . Atrial fibrillation ([place])   . CHF (congestive heart failure) ([place]) 01/2006   Has  Pacemaker and Defibrillator;was on heart meds;no longer taking since pregancy  . CHF (congestive heart failure) ([place])   . Hypertension    currently on Labetalol 200mg  BID  . Hypertension   . Infection    UTI;not frequent;last infection was in June  . Infection    Yeast;not frequent  . Migraines    Can't take Procardia XL, causes migraines  . Other primary cardiomyopathies   . Paroxysmal ventricular tachycardia ([place])   . Varicose veins 1998   Developed during last pregnancy    Current Outpatient Medications  Medication Sig Dispense Refill  . albuterol (PROVENTIL HFA;VENTOLIN HFA) 108 (90 Base) MCG/ACT inhaler Inhale 2 puffs into the lungs every 6 (six) hours as needed for wheezing or shortness of breath. Rescue inhaler 1 Inhaler 1  . amLODipine (NORVASC) 2.5 MG tablet Take 1 tablet (2.5 mg total) by mouth daily. 30 tablet 6  . calcium  carbonate (OSCAL) 1500 (600 Ca) MG TABS tablet Take 600 mg of elemental calcium by mouth 3 (three) times daily with meals.    . carvedilol (COREG) 25 MG tablet Take 1 tablet (25 mg total) by mouth 2 (two) times daily with a meal. 60 tablet 6  . cetirizine (ZYRTEC) 10 MG tablet Take 10 mg by mouth daily as needed for allergies.     . ferrous sulfate 325 (65 FE) MG tablet Take 1 tablet (325 mg total) by mouth 3 (three) times daily with meals. 90 tablet 3  . ranitidine (ZANTAC) 150 MG tablet Take 150 mg by mouth 2 (two) times daily.    . sacubitril-valsartan (ENTRESTO) 49-51 MG Take 1 tablet by mouth 2 (two) times daily. 60 tablet 11  . torsemide (DEMADEX) 20 MG tablet Take 2 tablets (40 mg total) by mouth daily. 60 tablet 3   No current facility-administered medications for this encounter.     There were no vitals filed for this visit. Wt Readings from Last 3 Encounters:  02/16/17 212 lb (96.2 kg)  01/19/17 216 lb 12.8 oz (98.3 kg)  01/13/17 220 lb (99.8 kg)     PHYSICAL EXAM: General:  Well appearing. No resp difficulty. Walked in the clinic  without difficulty.  HEENT: normal Neck: supple. no JVD. Carotids 2+ bilat; no bruits. No lymphadenopathy or thryomegaly appreciated. Cor: PMI nondisplaced. Regular rate & rhythm. No rubs, gallops or murmurs. Lungs: EW upper lobes.  Abdomen: soft, nontender, nondistended. No hepatosplenomegaly. No bruits or masses. Good bowel sounds. Extremities: no cyanosis, clubbing, rash, edema Neuro: alert & orientedx3, cranial nerves grossly intact. moves all 4 extremities w/o difficulty. Affect pleasant    ASSESSMENT & PLAN:  1) Chronic combined systolic/diastolic heart failure with recovered EF, NICM s/p ICD likely r/t HTN, last echo 1/16 EF 25-30%. Cath 8/16 EF No CAD. ECHO in July of this year. EF 55-60%/.   Optivol - fluid trending up but not above threshold. Activity ~3 hours per day. No VT/AF -Off bb.  -Volume status stable. Continue torsemide 40 mg daily with an extra 20 mg as needed.  -Off hydralazine/imdur/bb with EF recovery.   - Continue Entresto 49/51 mg BID.  2) OSA  -  Continues to use CPAP  3) HTN Elevated. Add back 2.5 mg amlodipine daily.  4) Asthma/chronic bronchitis - Stable.  5) Obesity: - S/p laparoscopic sleeve gastrectomy with hiatal hernia repair, upper endoscopy 6) Bronchitis: on levaquin daily per PCP.  Follow up in 2 weeks for BP check  Follow up in 3 months.   [name]  4:01 PM

## 2017-07-17 ENCOUNTER — Encounter (HOSPITAL_COMMUNITY): Payer: Self-pay

## 2017-07-17 ENCOUNTER — Emergency Department (HOSPITAL_COMMUNITY): Payer: Managed Care, Other (non HMO)

## 2017-07-17 ENCOUNTER — Other Ambulatory Visit: Payer: Self-pay

## 2017-07-17 ENCOUNTER — Inpatient Hospital Stay (HOSPITAL_COMMUNITY)
Admission: EM | Admit: 2017-07-17 | Discharge: 2017-07-19 | DRG: 193 | Disposition: A | Discharge status: Home / Self Care | Payer: Managed Care, Other (non HMO) | Attending: Internal Medicine | Admitting: Internal Medicine

## 2017-07-17 DIAGNOSIS — Z8249 Family history of ischemic heart disease and other diseases of the circulatory system: Secondary | ICD-10-CM

## 2017-07-17 DIAGNOSIS — J069 Acute upper respiratory infection, unspecified: Secondary | ICD-10-CM | POA: Diagnosis present

## 2017-07-17 DIAGNOSIS — I429 Cardiomyopathy, unspecified: Secondary | ICD-10-CM | POA: Diagnosis present

## 2017-07-17 DIAGNOSIS — R52 Pain, unspecified: Secondary | ICD-10-CM

## 2017-07-17 DIAGNOSIS — Z950 Presence of cardiac pacemaker: Secondary | ICD-10-CM | POA: Diagnosis present

## 2017-07-17 DIAGNOSIS — J101 Influenza due to other identified influenza virus with other respiratory manifestations: Secondary | ICD-10-CM | POA: Diagnosis not present

## 2017-07-17 DIAGNOSIS — I5032 Chronic diastolic (congestive) heart failure: Secondary | ICD-10-CM | POA: Diagnosis present

## 2017-07-17 DIAGNOSIS — N179 Acute kidney failure, unspecified: Secondary | ICD-10-CM

## 2017-07-17 DIAGNOSIS — E876 Hypokalemia: Secondary | ICD-10-CM | POA: Diagnosis present

## 2017-07-17 DIAGNOSIS — Z87440 Personal history of urinary (tract) infections: Secondary | ICD-10-CM

## 2017-07-17 DIAGNOSIS — J45909 Unspecified asthma, uncomplicated: Secondary | ICD-10-CM | POA: Diagnosis present

## 2017-07-17 DIAGNOSIS — I959 Hypotension, unspecified: Secondary | ICD-10-CM | POA: Diagnosis present

## 2017-07-17 DIAGNOSIS — Z825 Family history of asthma and other chronic lower respiratory diseases: Secondary | ICD-10-CM

## 2017-07-17 DIAGNOSIS — R3129 Other microscopic hematuria: Secondary | ICD-10-CM | POA: Diagnosis present

## 2017-07-17 DIAGNOSIS — I428 Other cardiomyopathies: Secondary | ICD-10-CM

## 2017-07-17 DIAGNOSIS — R531 Weakness: Secondary | ICD-10-CM | POA: Diagnosis not present

## 2017-07-17 DIAGNOSIS — G4733 Obstructive sleep apnea (adult) (pediatric): Secondary | ICD-10-CM | POA: Diagnosis present

## 2017-07-17 DIAGNOSIS — I5043 Acute on chronic combined systolic (congestive) and diastolic (congestive) heart failure: Secondary | ICD-10-CM | POA: Diagnosis present

## 2017-07-17 DIAGNOSIS — I4891 Unspecified atrial fibrillation: Secondary | ICD-10-CM | POA: Diagnosis present

## 2017-07-17 DIAGNOSIS — E861 Hypovolemia: Secondary | ICD-10-CM | POA: Diagnosis present

## 2017-07-17 DIAGNOSIS — R000 Tachycardia, unspecified: Secondary | ICD-10-CM | POA: Diagnosis present

## 2017-07-17 DIAGNOSIS — I110 Hypertensive heart disease with heart failure: Secondary | ICD-10-CM | POA: Diagnosis present

## 2017-07-17 DIAGNOSIS — I472 Ventricular tachycardia: Secondary | ICD-10-CM | POA: Diagnosis present

## 2017-07-17 DIAGNOSIS — Z885 Allergy status to narcotic agent status: Secondary | ICD-10-CM

## 2017-07-17 DIAGNOSIS — Z79899 Other long term (current) drug therapy: Secondary | ICD-10-CM

## 2017-07-17 DIAGNOSIS — I471 Supraventricular tachycardia: Secondary | ICD-10-CM | POA: Diagnosis present

## 2017-07-17 DIAGNOSIS — B9789 Other viral agents as the cause of diseases classified elsewhere: Secondary | ICD-10-CM

## 2017-07-17 DIAGNOSIS — R002 Palpitations: Secondary | ICD-10-CM | POA: Diagnosis not present

## 2017-07-17 DIAGNOSIS — E860 Dehydration: Secondary | ICD-10-CM | POA: Diagnosis present

## 2017-07-17 DIAGNOSIS — I440 Atrioventricular block, first degree: Secondary | ICD-10-CM | POA: Diagnosis present

## 2017-07-17 DIAGNOSIS — Z95810 Presence of automatic (implantable) cardiac defibrillator: Secondary | ICD-10-CM

## 2017-07-17 HISTORY — DX: Chronic systolic (congestive) heart failure: I50.22

## 2017-07-17 LAB — I-STAT TROPONIN, ED: Troponin i, poc: 0.01 ng/mL (ref 0.00–0.08)

## 2017-07-17 LAB — URINALYSIS, ROUTINE W REFLEX MICROSCOPIC
BILIRUBIN URINE: NEGATIVE
Glucose, UA: NEGATIVE mg/dL
Ketones, ur: 20 mg/dL — AB
Leukocytes, UA: NEGATIVE
Nitrite: NEGATIVE
Protein, ur: 100 mg/dL — AB
SPECIFIC GRAVITY, URINE: 1.016 (ref 1.005–1.030)
pH: 5 (ref 5.0–8.0)

## 2017-07-17 LAB — LACTIC ACID, PLASMA: Lactic Acid, Venous: 1.8 mmol/L (ref 0.5–1.9)

## 2017-07-17 LAB — RESPIRATORY PANEL BY PCR
Adenovirus: NOT DETECTED
BORDETELLA PERTUSSIS-RVPCR: NOT DETECTED
CHLAMYDOPHILA PNEUMONIAE-RVPPCR: NOT DETECTED
CORONAVIRUS 229E-RVPPCR: NOT DETECTED
Coronavirus HKU1: NOT DETECTED
Coronavirus NL63: NOT DETECTED
Coronavirus OC43: NOT DETECTED
INFLUENZA A H1 2009-RVPPR: DETECTED — AB
INFLUENZA B-RVPPCR: NOT DETECTED
MYCOPLASMA PNEUMONIAE-RVPPCR: NOT DETECTED
Metapneumovirus: NOT DETECTED
PARAINFLUENZA VIRUS 4-RVPPCR: NOT DETECTED
Parainfluenza Virus 1: NOT DETECTED
Parainfluenza Virus 2: NOT DETECTED
Parainfluenza Virus 3: NOT DETECTED
RESPIRATORY SYNCYTIAL VIRUS-RVPPCR: NOT DETECTED
Rhinovirus / Enterovirus: NOT DETECTED

## 2017-07-17 LAB — I-STAT BETA HCG BLOOD, ED (MC, WL, AP ONLY)

## 2017-07-17 LAB — CBC
HCT: 42.3 % (ref 36.0–46.0)
Hemoglobin: 13.7 g/dL (ref 12.0–15.0)
MCH: 27.2 pg (ref 26.0–34.0)
MCHC: 32.4 g/dL (ref 30.0–36.0)
MCV: 84.1 fL (ref 78.0–100.0)
PLATELETS: 213 10*3/uL (ref 150–400)
RBC: 5.03 MIL/uL (ref 3.87–5.11)
RDW: 15.5 % (ref 11.5–15.5)
WBC: 4.9 10*3/uL (ref 4.0–10.5)

## 2017-07-17 LAB — BASIC METABOLIC PANEL
Anion gap: 13 (ref 5–15)
BUN: 19 mg/dL (ref 6–20)
CHLORIDE: 103 mmol/L (ref 101–111)
CO2: 24 mmol/L (ref 22–32)
Calcium: 8.2 mg/dL — ABNORMAL LOW (ref 8.9–10.3)
Creatinine, Ser: 1.75 mg/dL — ABNORMAL HIGH (ref 0.44–1.00)
GFR calc Af Amer: 40 mL/min — ABNORMAL LOW (ref >60)
GFR calc non Af Amer: 35 mL/min — ABNORMAL LOW (ref >60)
GLUCOSE: 132 mg/dL — AB (ref 65–99)
Potassium: 3.8 mmol/L (ref 3.5–5.1)
Sodium: 140 mmol/L (ref 135–145)

## 2017-07-17 LAB — I-STAT CG4 LACTIC ACID, ED: LACTIC ACID, VENOUS: 2.15 mmol/L — AB (ref 0.5–1.9)

## 2017-07-17 LAB — BRAIN NATRIURETIC PEPTIDE: B NATRIURETIC PEPTIDE 5: 36.7 pg/mL (ref 0.0–100.0)

## 2017-07-17 MED ORDER — ONDANSETRON HCL 4 MG PO TABS: 4.0000 mg | ORAL_TABLET | Freq: Four times a day (QID) | ORAL | Status: DC | PRN

## 2017-07-17 MED ORDER — SODIUM CHLORIDE 0.9 % IV BOLUS (SEPSIS)
1000.0000 mL | Freq: Once | INTRAVENOUS | Status: AC
  Administered 2017-07-17: 1000 mL via INTRAVENOUS

## 2017-07-17 MED ORDER — ONDANSETRON HCL 4 MG/2ML IJ SOLN: 4.0000 mg | Freq: Four times a day (QID) | INTRAMUSCULAR | Status: DC | PRN

## 2017-07-17 MED ORDER — ENOXAPARIN SODIUM 40 MG/0.4ML ~~LOC~~ SOLN
40.0000 mg | SUBCUTANEOUS | Status: DC
  Administered 2017-07-17 – 2017-07-18 (×2): 40 mg via SUBCUTANEOUS
  Filled 2017-07-17 (×2): qty 0.4

## 2017-07-17 MED ORDER — ACETAMINOPHEN 650 MG RE SUPP
650.0000 mg | Freq: Four times a day (QID) | RECTAL | Status: DC | PRN
Start: 2017-07-17 — End: 2017-07-19

## 2017-07-17 MED ORDER — LORATADINE 10 MG PO TABS
10.0000 mg | ORAL_TABLET | Freq: Every day | ORAL | Status: DC
  Administered 2017-07-18 – 2017-07-19 (×2): 10 mg via ORAL
  Filled 2017-07-17 (×2): qty 1

## 2017-07-17 MED ORDER — CALCIUM CARBONATE 1500 (600 CA) MG PO TABS
600.0000 mg | ORAL_TABLET | Freq: Three times a day (TID) | ORAL | Status: DC
  Administered 2017-07-17 – 2017-07-19 (×5): 1500 mg via ORAL
  Filled 2017-07-17 (×6): qty 1

## 2017-07-17 MED ORDER — GUAIFENESIN 100 MG/5ML PO SOLN
5.0000 mL | ORAL | Status: DC | PRN
  Administered 2017-07-17: 100 mg via ORAL
  Filled 2017-07-17: qty 5

## 2017-07-17 MED ORDER — AMLODIPINE BESYLATE 2.5 MG PO TABS
2.5000 mg | ORAL_TABLET | Freq: Every day | ORAL | Status: DC
  Administered 2017-07-19: 2.5 mg via ORAL
  Filled 2017-07-17 (×2): qty 1

## 2017-07-17 MED ORDER — NOREPINEPHRINE BITARTRATE 1 MG/ML IV SOLN
0.0000 ug/min | Freq: Once | INTRAVENOUS | Status: AC
  Administered 2017-07-17: 2 ug/min via INTRAVENOUS

## 2017-07-17 MED ORDER — LEVALBUTEROL HCL 0.63 MG/3ML IN NEBU: 0.6300 mg | INHALATION_SOLUTION | Freq: Four times a day (QID) | RESPIRATORY_TRACT | Status: DC | PRN

## 2017-07-17 MED ORDER — TORSEMIDE 20 MG PO TABS
40.0000 mg | ORAL_TABLET | Freq: Every day | ORAL | Status: DC
  Administered 2017-07-17 – 2017-07-19 (×3): 40 mg via ORAL
  Filled 2017-07-17 (×3): qty 2

## 2017-07-17 MED ORDER — SODIUM CHLORIDE 0.9% FLUSH
3.0000 mL | Freq: Two times a day (BID) | INTRAVENOUS | Status: DC
  Administered 2017-07-17 – 2017-07-19 (×5): 3 mL via INTRAVENOUS

## 2017-07-17 MED ORDER — SODIUM CHLORIDE 0.9 % IV BOLUS (SEPSIS)
500.0000 mL | Freq: Once | INTRAVENOUS | Status: AC
  Administered 2017-07-17: 500 mL via INTRAVENOUS

## 2017-07-17 MED ORDER — TRAMADOL HCL 50 MG PO TABS: 50.0000 mg | ORAL_TABLET | Freq: Four times a day (QID) | ORAL | Status: DC | PRN

## 2017-07-17 MED ORDER — ADENOSINE 6 MG/2ML IV SOLN
6.0000 mg | Freq: Once | INTRAVENOUS | Status: AC
  Administered 2017-07-17: 6 mg via INTRAVENOUS
  Filled 2017-07-17: qty 2

## 2017-07-17 MED ORDER — SACUBITRIL-VALSARTAN 49-51 MG PO TABS
1.0000 | ORAL_TABLET | Freq: Two times a day (BID) | ORAL | Status: DC
  Administered 2017-07-17 – 2017-07-19 (×5): 1 via ORAL
  Filled 2017-07-17 (×5): qty 1

## 2017-07-17 MED ORDER — ACETAMINOPHEN 325 MG PO TABS: 650.0000 mg | ORAL_TABLET | Freq: Four times a day (QID) | ORAL | Status: DC | PRN

## 2017-07-17 MED ORDER — GUAIFENESIN-DM 100-10 MG/5ML PO SYRP: 5.0000 mL | ORAL_SOLUTION | Freq: Four times a day (QID) | ORAL | Status: DC | PRN

## 2017-07-17 MED ORDER — ADULT MULTIVITAMIN W/MINERALS CH
1.0000 | ORAL_TABLET | Freq: Every day | ORAL | Status: DC
  Administered 2017-07-17 – 2017-07-19 (×3): 1 via ORAL
  Filled 2017-07-17 (×3): qty 1

## 2017-07-17 NOTE — ED Notes (Signed)
Pt transferred to bedside commode with no issues; HR remained 80-100.

## 2017-07-17 NOTE — ED Notes (Signed)
Lunch provided to pt
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
## 2017-07-17 NOTE — Consult Note (Signed)
Cardiology Consultation:   Patient ID: [name]; [id number]; January 14, 1976   Admit date: 07/17/2017 Date of Consult: 07/17/2017  Primary Care Provider: Maurice Small, MD Primary Cardiologist: Dr Donnamae Jude, NP 07/15/2017 Primary Electrophysiologist:  Dr Lovena Le   Patient Profile:   [name] is a 41 y.o. female with a hx of  obesity, S-CHF, NICM (cath 2007 & 2016. No CAD) s/p Medtronic ICD implantation, asthma/chronic bronchitis, severe hypertension, gastric sleeve and its resection w/ HH repair   who is being seen today for the evaluation of tachycardia at the request of Dr Roxanne Mins.  History of Present Illness:   [name] was seen in the CHF clinic on 12/20.  Her weight was 196 pounds, at baseline.  Her volume status was good by exam.  Amlodipine added for hypertension.  According to the patient, her blood pressure has been running good at home.  Ms. [name] had recently developed a respiratory illness.  She was having problems with cough, fever, general malaise, sinus congestion and diarrhea.  She was taking over-the-counter cold medications such as Robitussin-DM, but was avoiding multisymptom cold meds.  She was not taking decongestants.  She had seen her PCP and was started on Levaquin 3 days ago.  Last p.m., pt felt her heart start to race.  She was checking her blood pressure and noticed that her systolic blood pressure was running low, below 100, which is unusual.  Associated with this, she became diaphoretic and was very lightheaded and weak.  She was not able to sleep at all last night because she stated she was afraid to go to sleep she was afraid she would not wake up.  This a.m., her systolic blood pressure was lower, in the 70s.  She felt that her mental status was deteriorating.  EMS was called.  She was having trouble answering questions and was very weak.  Upon arrival in the emergency room, her lactic acid level was somewhat elevated at 2.15.  BNP was within  normal limits and point-of-care troponin was negative.  Her BUN was not significantly elevated at 19, but her creatinine was above normal at 1.75.  She has received 2 L of IV fluid and is currently getting the third.   As I was in the room with her, she had some palpitations that were different she could feel something was going on in her heart.  Her rhythm changed from possible atrial tach to sinus rhythm and sinus tach and her heart rate decreased by 20 bpm.  She had a few PVCs, but no VT.  After she converted to sinus rhythm, she felt much better.  However, she was still hypotensive with a systolic in the 26R on Levophed.    Past Medical History:  Diagnosis Date  . Acute bronchitis and bronchiolitis   . Anemia    Iron supplements in the past  . Asthma    Inhaler prn;triggered by seasonal changes mostly cold weathert;last asthma  attack years ago  . Atrial fibrillation ([place])   . CHF (congestive heart failure) ([place])   . Chronic systolic CHF (congestive heart failure), NYHA class 2 ([place]) 01/2006   Has MDT Defibrillator, EF had improved by 2017 echo  . Hypertension   . Hypertension   . Infection    UTI;not frequent;last infection was in June  . Infection    Yeast;not frequent  . Migraines    Can't take Procardia XL, causes migraines  . Other primary cardiomyopathies   . Paroxysmal ventricular  tachycardia ([place])   . Varicose veins 1998   Developed during last pregnancy    Past Surgical History:  Procedure Laterality Date  . BREAST REDUCTION SURGERY  03/05/1994   d/t back, shoulder, and neck pain (42 F)  . CARDIAC CATHETERIZATION     EF 30-35%  . CARDIAC CATHETERIZATION N/A 05/04/2015   Procedure: Left Heart Cath and Coronary Angiography;  Surgeon: Belva Crome, MD;  Location: [facility];  Service: Cardiovascular;  Laterality: N/A;  . CARDIAC DEFIBRILLATOR PLACEMENT    . CESAREAN SECTION  1998   d/t preeclampsia  . IMPLANTABLE CARDIOVERTER DEFIBRILLATOR (ICD) GENERATOR  CHANGE N/A 09/10/2014   Procedure: ICD GENERATOR CHANGE;  Surgeon: Evans Lance, MD;  Location: [facility] C[facility];  Service: Cardiovascular;  Laterality: N/A;  . INSERT / REPLACE / REMOVE PACEMAKER   02/10/2006   Status post implantable cardioverter-defibrillator insertion  .Marland KitchenMarland KitchenMarland KitchenThe Medtronic Maximo VR, model 7232, single chamber  . LAPAROSCOPIC GASTRIC SLEEVE RESECTION WITH HIATAL HERNIA REPAIR N/A 11/24/2016   Procedure: LAPAROSCOPIC GASTRIC SLEEVE RESECTION WITH HIATAL HERNIA REPAIR, UPPER ENDO;  Surgeon: Greer Pickerel, MD;  Location: [facility];  Service: General;  Laterality: N/A;  . LAPAROSCOPIC OVARIAN CYSTECTOMY Bilateral 11/24/2016   Procedure: LAPAROSCOPIC BILATERAL OVARIAN CYSTECTOMY;  Surgeon: Janyth Pupa, DO;  Location: [facility];  Service: Gynecology;  Laterality: Bilateral;     Prior to Admission medications   Medication Sig Start Date End Date Taking? Authorizing Provider  albuterol (PROVENTIL HFA;VENTOLIN HFA) 108 (90 Base) MCG/ACT inhaler Inhale 2 puffs into the lungs every 6 (six) hours as needed for wheezing or shortness of breath. Rescue inhaler 09/12/15  Yes Bensimhon, Shaune Pascal, MD  amLODipine (NORVASC) 2.5 MG tablet Take 1 tablet (2.5 mg total) by mouth daily. 07/15/17  Yes Clegg, Amy D, NP  calcium carbonate (OSCAL) 1500 (600 Ca) MG TABS tablet Take 600 mg of elemental calcium by mouth 3 (three) times daily with meals.   Yes [provider]  cetirizine (ZYRTEC) 10 MG tablet Take 10 mg by mouth daily as needed for allergies.    Yes [provider]  guaiFENesin-dextromethorphan (ROBITUSSIN DM) 100-10 MG/5ML syrup Take 5 mLs by mouth every 6 (six) hours as needed for cough.   Yes [provider]  levofloxacin (LEVAQUIN) 500 MG tablet Take 500 mg by mouth daily. 07/14/17 07/22/17 Yes [provider]  Multiple Vitamin (MULTIVITAMIN WITH MINERALS) TABS tablet Take 1 tablet by mouth daily.   Yes [provider]  sacubitril-valsartan (ENTRESTO) 49-51 MG  Take 1 tablet by mouth 2 (two) times daily. 12/04/16  Yes [name], NP  torsemide (DEMADEX) 20 MG tablet Take 2 tablets (40 mg total) by mouth daily. 12/04/16  Yes [name], NP    Inpatient Medications: Scheduled Meds:  Continuous Infusions:   Allergies:    Allergies  Allergen Reactions  . Percocet [Oxycodone-Acetaminophen] Shortness Of Breath    Tolerates acetaminophen    Social History:   Social History   Socioeconomic History  . Marital status: Married    Spouse name: Vicy Medico  . Number of children: 1  . Years of education: 31  . Highest education level: Not on file  Social Needs  . Financial resource strain: Not on file  . Food insecurity - worry: Not on file  . Food insecurity - inability: Not on file  . Transportation needs - medical: Not on file  . Transportation needs - non-medical: Not on file  Occupational History  . Occupation:  Asst. Manager    Comment: [employer]  . Occupation: Aeronautical engineer: [employer]  Tobacco Use  . Smoking status: Never Smoker  . Smokeless tobacco: Never Used  Substance and Sexual Activity  . Alcohol use: No  . Drug use: No  . Sexual activity: Yes    Partners: Male    Birth control/protection: Condom, None  Other Topics Concern  . Not on file  Social History Narrative   ** Merged History Encounter **        Family History:   Family History  Problem Relation Age of Onset  . Sickle cell trait Daughter   . Other Father        Varicose veins  . Hypertension Father   . Diabetes Father        Controlled w/ diet and exercise  . Colon polyps Father        approx 3-4  . Asthma Daughter   . Seizures Maternal Uncle   . Migraines Mother   . Ulcers Mother   . Rheum arthritis Mother   . Colon polyps Mother        approx 2 polyps  . Migraines Daughter   . Migraines Cousin        Maternal  . Uterine cancer Paternal Grandmother        d. 60-61  . Prostate cancer Maternal Uncle        dx 69-70;  surgery and rad  . Lung cancer Maternal Uncle 65       d. 69-70; smoker  . Lupus Maternal Aunt   . Hypertension Paternal Aunt   . Other Daughter        blood transfusion @ birth;platelets were low  . Diabetes Maternal Aunt   . Lung cancer Maternal Aunt        dx 71-72; +chemo  . Diabetes Paternal Aunt        x 2  . Diabetes Paternal Uncle   . Hypertension Unknown   . Heart disease Unknown   . Cancer Cousin        maternal 1st cousin d. early 68s; NOS cancer  . Breast cancer Cousin        maternal 1st cousin dx 4-48  . Breast cancer Other        maternal great aunt (MGM's sister) d. breast cancer in her late 71s  . Kidney cancer Cousin        paternal 1st cousin dx 12s; nephrectomy; not a smoker  . Ovarian cancer Paternal Aunt 89       paternal aunt (father's maternal half-sister)  . Breast cancer Paternal Aunt   . Ovarian cancer Paternal Aunt   . Uterine cancer Paternal Aunt   . Cancer Other        paternal great aunt (PGM's sister) dx NOS cancer  . Cancer Other        paternal great uncle (PGM's brother) dx NOS cancer  . Colon cancer Neg Hx    Family Status:  Family Status  Relation Name Status  . Daughter  (Not Specified)  . Father  Alive, age 57y  . Daughter  (Not Specified)  . Mat Nordstrom  . Mother  76, age 6y  . Daughter  (Not Specified)  . Cousin  (Not Specified)  . PGM  Deceased at age 73-61       uterine cancer  . [place], age 60y  mother's maternal half-brother  . Mat Uncle  Deceased at age 56-70       lung ca  . [place], age 5y  . Ethlyn Daniels  Deceased  . Daughter  (Not Specified)  . [place], age 75y       mother's maternal half-sister  . Ethlyn Daniels  Deceased  . Annamarie Major (x2) Deceased  . Unknown  (Not Specified)  . Unknown  (Not Specified)  . MGM  Deceased at age 26       no cancer  . MGF  Deceased at age 49s       lim info  . PGF  Deceased at age 45       lim info  . Mat Uncle  Deceased at age 42s        murdered  . Mat Uncle  Deceased at age 17s       murdered  . [place], age 21y  . Cousin Mat 1st Deceased at age early 28s       NOS cancer  . Cousin Mat 1st Alive  . Other Mat Great Aunt Deceased at age late 43s  . Ethlyn Daniels  Deceased  . Cousin Pat 1st Alive  . Ethlyn Daniels  Alive, age 62y  . Ethlyn Daniels  Deceased at age 27-62       cancer  . Other [place] Bougie Aunt Deceased  . Other Massie Bougie Uncle Deceased  . Neg Hx  (Not Specified)    ROS:  Please see the history of present illness.  All other ROS reviewed and negative.     Physical Exam/Data:   Vitals:   07/17/17 0730 07/17/17 0734 07/17/17 0800 07/17/17 0815  BP: (!) 86/60  94/63 107/70  Pulse: (!) 139  (!) 109 (!) 108  Resp:   (!) 26 20  Temp:  99 F (37.2 C)    TempSrc:  Oral    SpO2: 96%  99% 100%  Weight:      Height:       No intake or output data in the 24 hours ending 07/17/17 0832 Filed Weights   07/17/17 0412  Weight: 192 lb (87.1 kg)   Body mass index is 36.28 kg/m.  General:  Well nourished, well developed, in no moderate distress HEENT: normal Lymph: no adenopathy Neck: Minimal JVD Endocrine:  No thryomegaly Vascular: No carotid bruits; 4/4 extremity pulses 2+, without bruits  Cardiac:  normal S1, S2; RRR; no murmur  Lungs: Rhonchi and a few rales bilaterally, no wheezing  Abd: soft, nontender, no hepatomegaly  Ext: no edema Musculoskeletal:  No deformities, BUE and BLE strength normal and equal Skin: warm and dry  Neuro:  CNs 2-12 intact, no focal abnormalities noted Psych:  Normal affect    EKG:  The EKG was personally reviewed and demonstrates: Sinus tach versus atrial tach with a heart rate of 138 Telemetry:  Telemetry was personally reviewed and demonstrates: Tachycardic converted to clearly sinus rhythm with a heart rate in the 110s  Relevant CV Studies:  ECHO: 02/08/2017 - Left ventricle: The cavity size was normal. Wall thickness was   normal. Systolic function was normal. The  estimated ejection   fraction was in the range of 60% to 65%. Wall motion was normal;   there were no regional wall motion abnormalities. Features are   consistent with a pseudonormal left ventricular filling pattern,   with concomitant abnormal relaxation and increased filling  pressure (grade 2 diastolic dysfunction).  CATH: 05/04/2015  Widely patent coronary arteries. No obstructive coronary lesions are noted.  While imaging the right coronary, a 50% mid vessel stenosis developed, and completely resolved with intracoronary nitroglycerin. The development of this lesion was not associated with clinical symptoms. Dilated and hypocontractile left ventricle with near akinesis of the mid inferior wall. Estimated EF in the 35% range. RECOMMENDATIONS: Intensification of therapy for coronary artery spasm.  Laboratory Data:  Chemistry Recent Labs  Lab 07/17/17 0402  NA 140  K 3.8  CL 103  CO2 24  GLUCOSE 132*  BUN 19  CREATININE 1.75*  CALCIUM 8.2*  GFRNONAA 35*  GFRAA 40*  ANIONGAP 13     Hematology Recent Labs  Lab 07/17/17 0402  WBC 4.9  RBC 5.03  HGB 13.7  HCT 42.3  MCV 84.1  MCH 27.2  MCHC 32.4  RDW 15.5  PLT 213   Cardiac Enzymes  Recent Labs  Lab 07/17/17 0438  TROPIPOC 0.01    BNP Recent Labs  Lab 07/17/17 0439  BNP 36.7    TSH:  Lab Results  Component Value Date   TSH 2.15 10/07/2015    Radiology/Studies:  Dg Chest Port 1 View  Result Date: 07/17/2017 CLINICAL DATA:  Acute onset of nausea, vomiting and diarrhea. Hypotension. Fever. EXAM: PORTABLE CHEST 1 VIEW COMPARISON:  Chest radiograph performed 07/14/2017 FINDINGS: The lungs are well-aerated and clear. There is no evidence of focal opacification, pleural effusion or pneumothorax. The cardiomediastinal silhouette is borderline enlarged. An AICD is noted overlying the left chest wall, with a single lead ending overlying the right ventricle. No acute osseous abnormalities are seen.  IMPRESSION: Borderline cardiomegaly.  Lungs remain grossly clear. Electronically Signed   By: Garald Balding M.D.   On: 07/17/2017 04:59    Assessment and Plan:   Active Problems:  1. Tachycardia -Device interrogation pending, further information available after that. -Although her heart rate has improved, she is still hypotensive. -She had a few PVCs in pairs, but no VT -Continue to follow while hospitalized, keep on telemetry  2.  Hypotension -Chest x-ray without pneumonia -Patient has had upper respiratory infection and has been on antibiotics for 3 days. -She has also had diarrhea and her creatinine is above normal, agree with IV fluids -Watch volume status -Per CCM/IM    For questions or updates, please contact [facility] Please consult [email or web address] for contact info under Cardiology/STEMI.   Jonetta Speak, PA-C  07/17/2017 8:32 AM  EP attending  Patient seen and examined.  She is well-known to me.  She has a long-standing nonischemic cardiomyopathy, severe left ventricular dysfunction, and chronic systolic heart failure.  She suffered an upper respiratory illness for the last week, it was taking over-the-counter cold medications including Robitussin-DM.  She developed palpitations and presented to the emergency room.  She was in a narrow QRS tachycardia with a short RP interval.  She was given intravenous adenosine resulting in slowing of the tachycardia and prompt resumption.  She is subtotally returned to sinus rhythm.  She has been given intravenous fluid and was started on inotropic support.  She feels better.  Her exam is accurately documented above.  EKG demonstrates atrial tachycardia with first-degree AV block.  She is now back to sinus rhythm on telemetry at 100 beats a minute.  Her blood pressure is still a little on the low side.  She denies any fever or chills.  Assessment and plan 1.  Atrial tachycardia -I suspect  this occurred secondary to her  cardiomyopathy in conjunction with her upper respiratory illness.  She is back to sinus rhythm.  She should be monitored for at least 24 hours to be sure that she does not revert back to atrial tachycardia. 2.  Acute on chronic systolic heart failure -her tachycardia has exacerbated her heart failure resulting in a low output state.  Her tachycardia has resolved.  I would hold her heart failure medications until her blood pressure improves.  Would also hold any antihypertensive meds.  At this point, her inotropic support should be weaned off.   3.  Upper respiratory illness -on exam she does not have evidence of pneumonia.  She is not febrile.  I will defer the decision about antibiotic therapy to her primary physician. 4.  Disposition -normally a patient with sustained  atrial tachycardia could be discharged home.  However because of her severe left ventricular dysfunction and borderline blood pressure and other comorbidities, would recommend observing the patient for 24 hours to be sure that her heart failure stable, her blood pressure improves, and she has no recurrent atrial arrhythmias.  Crissie Sickles, MD

## 2017-07-17 NOTE — ED Notes (Signed)
Delay in lab draw,  Provider at bedside. 

## 2017-07-17 NOTE — ED Triage Notes (Signed)
Pt. Via EMS for hypotension. Pt. Reports being sick since Tuesday and was diagnosed with bronchitis. Pt. Reports n/v/d. EMS reports pt. Was extremely pale and clammy. Pt. Given 500 en route. Pt. Reports having a fever 102 yesterday and took tylenol to treat.

## 2017-07-17 NOTE — H&P (Signed)
History and Physical    [name] KNL:[id number] DOB: 25-Jul-1976 DOA: 07/17/2017  PCP: Briana Small, [name]   Patient coming from: Home  I have personally briefly reviewed patient's old medical records in [place]  Chief Complaint: Weakness and fast heart rate which began this morning.   HPI: [name] is a 41 y.o. female with medical history significant of CHF, atrial fibrillation, and hypertension who presents to the emergency department due to hypotension. 41 year old female with history of heart failure and ventricular tachycardi related to cardiomyopathy comes in with onset this morning of tachycardia and general malaise.  Around 3 AM she called EMS due to feeling clammy pale and dizzy.  EMS arrived and noted she was hypotensive with systolic in the 79K and 24O.  She was given 700 mL of fluid on route with dramatic improvement in her status and her mentation. She has been sick for the last 3 days with fevers as high as 102. She was evaluated at urgent care and started on levofloxacin.  He was told her flu test was negative.  Her chest x-ray was unremarkable at that time. After total of 3 L of fluids, patient remained hypotensive.  She continues to appear nontoxic.  Lactic acid is only minimally elevated and is felt to be related to hypotension and not sepsis.  Because of failure to respond to fluids, she was given a dose of adenosine to see if her tachycardia was something other than sinus tachycardia.  She reacted appropriately with short period of asystole followed by sinus rhythm which gradually increased in rate until it got back to the prior rate of 140.  She is started on norepinephrine for pressor control and is given additional liter of fluid for a total of 4 L.  Critical care was contacted and recommended a an evaluation by cardiology.  On arrival of cardiology patient spontaneously converted back from atrial tachycardia into a sinus rhythm.  Her blood pressure and heart  rates both significantly improved at that time.  She required no cardioversion.  Given this improvement cardiology felt the patient should be monitored in hospital to determine that she did not go into any further arrhythmias.  At the time of evaluation they felt the patient was dry.  Of course there is concern that she may developed focal T managing the 4 L of fluid she was prescribed in the emergency department.  Given this concern patient will be placed in observation overnight.  ED Course: Presented tachycardic and hypotensive, received 4 L of IV fluid and 1 dose of adenosine.  Adenosine feel the sinus tachycardia however patient spontaneously converted rate cardiology saw the patient and recommended observation to monitor his heart rate, heart rhythm, and fluid volume status.  Evaluation in the ED did not reveal any signs of sepsis.   Review of Systems: As per HPI otherwise 10 point review of systems negative.    Past Medical History:  Diagnosis Date  . Acute bronchitis and bronchiolitis   . Anemia    Iron supplements in the past  . Asthma    Inhaler prn;triggered by seasonal changes mostly cold weathert;last asthma  attack years ago  . Atrial fibrillation ([place])   . CHF (congestive heart failure) ([place])   . Chronic systolic CHF (congestive heart failure), NYHA class 2 ([place]) 01/2006   Has MDT Defibrillator, EF had improved by 2017 echo  . Hypertension   . Hypertension   . Infection    UTI;not frequent;last infection  was in June  . Infection    Yeast;not frequent  . Migraines    Can't take Procardia XL, causes migraines  . Other primary cardiomyopathies   . Paroxysmal ventricular tachycardia ([place])   . Varicose veins 1998   Developed during last pregnancy    Past Surgical History:  Procedure Laterality Date  . BREAST REDUCTION SURGERY  03/05/1994   d/t back, shoulder, and neck pain (42 F)  . CARDIAC CATHETERIZATION     EF 30-35%  . CARDIAC CATHETERIZATION N/A 05/04/2015    Procedure: Left Heart Cath and Coronary Angiography;  Surgeon: [name];  Location: [facility];  Service: Cardiovascular;  Laterality: N/A;  . CARDIAC DEFIBRILLATOR PLACEMENT    . CESAREAN SECTION  1998   d/t preeclampsia  . IMPLANTABLE CARDIOVERTER DEFIBRILLATOR (ICD) GENERATOR CHANGE N/A 09/10/2014   Procedure: ICD GENERATOR CHANGE;  Surgeon: [name];  [facility];  Service: Cardiovascular;  Laterality: N/A;  . INSERT / REPLACE / REMOVE PACEMAKER   02/10/2006   Status post implantable cardioverter-defibrillator insertion  .Marland KitchenMarland KitchenMarland KitchenThe Medtronic Maximo VR, model 7232, single chamber  . LAPAROSCOPIC GASTRIC SLEEVE RESECTION WITH HIATAL HERNIA REPAIR N/A 11/24/2016   Procedure: LAPAROSCOPIC GASTRIC SLEEVE RESECTION WITH HIATAL HERNIA REPAIR, UPPER ENDO;  Surgeon: [name];  Location: [facility];  Service: General;  Laterality: N/A;  . LAPAROSCOPIC OVARIAN CYSTECTOMY Bilateral 11/24/2016   Procedure: LAPAROSCOPIC BILATERAL OVARIAN CYSTECTOMY;  Surgeon: [name];  Location: [facility];  Service: Gynecology;  Laterality: Bilateral;     reports that  has never smoked. she has never used smokeless tobacco. She reports that she does not drink alcohol or use drugs.  Allergies  Allergen Reactions  . Percocet [Oxycodone-Acetaminophen] Shortness Of Breath    Tolerates acetaminophen    Family History  Problem Relation Age of Onset  . Sickle cell trait Daughter   . Other Father        Varicose veins  . Hypertension Father   . Diabetes Father        Controlled w/ diet and exercise  . Colon polyps Father        approx 3-4  . Asthma Daughter   . Seizures Maternal Uncle   . Migraines Mother   . Ulcers Mother   . Rheum arthritis Mother   . Colon polyps Mother        approx 2 polyps  . Migraines Daughter   . Migraines Cousin        Maternal  . Uterine cancer Paternal Grandmother        d. 60-61  . Prostate cancer Maternal Uncle        dx 69-70; surgery  and rad  . Lung cancer Maternal Uncle 65       d. 69-70; smoker  . Lupus Maternal Aunt   . Hypertension Paternal Aunt   . Other Daughter        blood transfusion @ birth;platelets were low  . Diabetes Maternal Aunt   . Lung cancer Maternal Aunt        dx 71-72; +chemo  . Diabetes Paternal Aunt        x 2  . Diabetes Paternal Uncle   . Hypertension Unknown   . Heart disease Unknown   . Cancer Cousin        maternal 1st cousin d. early 7s; NOS cancer  . Breast cancer Cousin        maternal 1st cousin dx  6-48  . Breast cancer Other        maternal great aunt (MGM's sister) d. breast cancer in her late 24s  . Kidney cancer Cousin        paternal 1st cousin dx 43s; nephrectomy; not a smoker  . Ovarian cancer Paternal Aunt 22       paternal aunt (father's maternal half-sister)  . Breast cancer Paternal Aunt   . Ovarian cancer Paternal Aunt   . Uterine cancer Paternal Aunt   . Cancer Other        paternal great aunt (PGM's sister) dx NOS cancer  . Cancer Other        paternal great uncle (PGM's brother) dx NOS cancer  . Colon cancer Neg Hx      Prior to Admission medications   Medication Sig Start Date End Date Taking? Authorizing Provider  albuterol (PROVENTIL HFA;VENTOLIN HFA) 108 (90 Base) MCG/ACT inhaler Inhale 2 puffs into the lungs every 6 (six) hours as needed for wheezing or shortness of breath. Rescue inhaler 09/12/15  Yes Bensimhon, Shaune Pascal, [name]  amLODipine (NORVASC) 2.5 MG tablet Take 1 tablet (2.5 mg total) by mouth daily. 07/15/17  Yes Clegg, Amy D, NP  calcium carbonate (OSCAL) 1500 (600 Ca) MG TABS tablet Take 600 mg of elemental calcium by mouth 3 (three) times daily with meals.   Yes Provider, Historical, [name]  cetirizine (ZYRTEC) 10 MG tablet Take 10 mg by mouth daily as needed for allergies.    Yes Provider, Historical, [name]  guaiFENesin-dextromethorphan (ROBITUSSIN DM) 100-10 MG/5ML syrup Take 5 mLs by mouth every 6 (six) hours as needed for cough.   Yes Provider,  Historical, [name]  levofloxacin (LEVAQUIN) 500 MG tablet Take 500 mg by mouth daily. 07/14/17 07/22/17 Yes Provider, Historical, [name]  Multiple Vitamin (MULTIVITAMIN WITH MINERALS) TABS tablet Take 1 tablet by mouth daily.   Yes Provider, Historical, [name]  sacubitril-valsartan (ENTRESTO) 49-51 MG Take 1 tablet by mouth 2 (two) times daily. 12/04/16  Yes [name], NP  torsemide (DEMADEX) 20 MG tablet Take 2 tablets (40 mg total) by mouth daily. 12/04/16  Yes [name], NP    Physical Exam: Vitals:   07/17/17 1000 07/17/17 1015 07/17/17 1045 07/17/17 1100  BP: 123/74 113/67 113/75 101/69  Pulse: 100 94 87 87  Resp: (!) 26 15 12 16   Temp:      TempSrc:      SpO2: 100% 100% 100% 100%  Weight:      Height:        Constitutional: NAD, calm, comfortable Vitals:   07/17/17 1000 07/17/17 1015 07/17/17 1045 07/17/17 1100  BP: 123/74 113/67 113/75 101/69  Pulse: 100 94 87 87  Resp: (!) 26 15 12 16   Temp:      TempSrc:      SpO2: 100% 100% 100% 100%  Weight:      Height:       Eyes: PERRL, lids and conjunctivae normal ENMT: Mucous membranes are moist. Posterior pharynx clear of any exudate or lesions.Normal dentition.  Neck: normal, supple, no masses, no thyromegaly Respiratory: clear to auscultation bilaterally, no wheezing, no crackles. Normal respiratory effort. No accessory muscle use.  Cardiovascular: Regular rate and rhythm, no murmurs / rubs / gallops. No extremity edema. 2+ pedal pulses. No carotid bruits.  Abdomen: no tenderness, no masses palpated. No hepatosplenomegaly. Bowel sounds positive.  Musculoskeletal: no clubbing / cyanosis. No joint deformity upper and lower extremities. Good ROM, no contractures. Normal muscle tone.  Skin: no rashes, lesions, ulcers. No induration Neurologic: CN 2-12 grossly intact. Sensation intact, DTR normal. Strength 5/5 in all 4.  Psychiatric: Normal judgment and insight. Alert and oriented x 3. Normal mood.     Labs on Admission: I have  personally reviewed following labs and imaging studies  CBC: Recent Labs  Lab 07/17/17 0402  WBC 4.9  HGB 13.7  HCT 42.3  MCV 84.1  PLT 458   Basic Metabolic Panel: Recent Labs  Lab 07/17/17 0402  NA 140  K 3.8  CL 103  CO2 24  GLUCOSE 132*  BUN 19  CREATININE 1.75*  CALCIUM 8.2*   GFR: Estimated Creatinine Clearance: 41.6 mL/min (A) (by C-G formula based on SCr of 1.75 mg/dL (H)). Liver Function Tests: No results for input(s): AST, ALT, ALKPHOS, BILITOT, PROT, ALBUMIN in the last 168 hours. No results for input(s): LIPASE, AMYLASE in the last 168 hours. No results for input(s): AMMONIA in the last 168 hours. Coagulation Profile: No results for input(s): INR, PROTIME in the last 168 hours. Cardiac Enzymes: No results for input(s): CKTOTAL, CKMB, CKMBINDEX, TROPONINI in the last 168 hours. BNP (last 3 results) No results for input(s): PROBNP in the last 8760 hours. HbA1C: No results for input(s): HGBA1C in the last 72 hours. CBG: No results for input(s): GLUCAP in the last 168 hours. Lipid Profile: No results for input(s): CHOL, HDL, LDLCALC, TRIG, CHOLHDL, LDLDIRECT in the last 72 hours. Thyroid Function Tests: No results for input(s): TSH, T4TOTAL, FREET4, T3FREE, THYROIDAB in the last 72 hours. Anemia Panel: No results for input(s): VITAMINB12, FOLATE, FERRITIN, TIBC, IRON, RETICCTPCT in the last 72 hours. Urine analysis:    Component Value Date/Time   COLORURINE YELLOW 07/17/2017 0835   APPEARANCEUR HAZY (A) 07/17/2017 0835   LABSPEC 1.016 07/17/2017 0835   PHURINE 5.0 07/17/2017 0835   GLUCOSEU NEGATIVE 07/17/2017 0835   HGBUR [name] (A) 07/17/2017 0835   BILIRUBINUR NEGATIVE 07/17/2017 0835   BILIRUBINUR negative 04/04/2012 1545   KETONESUR 20 (A) 07/17/2017 0835   PROTEINUR 100 (A) 07/17/2017 0835   UROBILINOGEN 0.2 12/08/2013 0540   NITRITE NEGATIVE 07/17/2017 0835   LEUKOCYTESUR NEGATIVE 07/17/2017 0835    Radiological Exams on Admission: Dg  Chest Port 1 View  Result Date: 07/17/2017 CLINICAL DATA:  Acute onset of nausea, vomiting and diarrhea. Hypotension. Fever. EXAM: PORTABLE CHEST 1 VIEW COMPARISON:  Chest radiograph performed 07/14/2017 FINDINGS: The lungs are well-aerated and clear. There is no evidence of focal opacification, pleural effusion or pneumothorax. The cardiomediastinal silhouette is borderline enlarged. An AICD is noted overlying the left chest wall, with a single lead ending overlying the right ventricle. No acute osseous abnormalities are seen. IMPRESSION: Borderline cardiomegaly.  Lungs remain grossly clear. Electronically Signed   By: Garald Balding M.D.   On: 07/17/2017 04:59    EKG: Independently reviewed.  Sinus tachycardia Prolonged PR interval LVH with secondary repolarization abnormality ST depr, consider ischemia, inferior leads Prolonged QT interval When compared with ECG of 09/04/2016, HEART RATE has increased QT has lengthened ST depression in inferior leads is now present - possibly rate-related .  Assessment/Plan Principal Problem:   Hypotension Active Problems:   Tachycardia   Nonischemic cardiomyopathy (HCC)   Viral upper respiratory tract infection with cough   Chronic diastolic heart failure (HCC)   Pacemaker   Cardiac defibrillator in situ   OSA (obstructive sleep apnea)     1.  Hypotension: Patient will be admitted into the hospital and followed closely.  She  received significant amount of fluid in the emergency department and some levo fed which was then discontinued.  Currently she shows no sign of volume overload.  2.  Tachycardia: Upon presentation to the hospital the patient was noted to be in a sinus tachycardia.  Rates were very high and she significantly improved after spontaneous cardioversion 3 L of fluid and adenosine.  We will monitor heart rates closely.  3.  Viral upper respiratory infection with cough: Will check a respiratory viral panel.  Place the patient in  respiratory isolation until that returns.  She has not had any response to Levaquin and will discontinue Levaquin at this point.  Chest x-ray unremarkable.  4.  Nonischemic cardiomyopathy: Possibly related to hypertension.  Given that the patient has a history of nonischemic cardiomyopathy severe enough to place a defibrillator will monitor her overnight for development of volume overload after receiving such large volumes of fluid in the emergency department.  5.  Diastolic congestive heart failure: Continue current management.  Does not have any sign of exacerbation at this point.  Echocardiogram obtained on February 08, 2017 shows normal systolic function ejection fraction of 60-65%.  Normal wall motion.  Pseudo-normal left ventricular filling pattern with concha mitten abnormal relaxation and increasing filling pressure consistent with grade 2 diastolic dysfunction.  6.  Pacemaker: Noted.  7.  Cardiac defibrillator in situ: Noted.  8.  Obstructive sleep apnea: Continue home treatment plan.  DVT prophylaxis: Lovenox Code Status: Full Family Communication: Since father Mr. Grandville Silos who was present in the room on admission.  All questions answered. Disposition Plan: Hopefully home in a.m. Consults called: Cardiology Admission status: Observation   Lady Deutscher [name] FACP [facility] Pager [phone] If 7PM-7AM, please contact night-coverage [email or web address] Password [facility]  07/17/2017, 11:28 AM

## 2017-07-17 NOTE — Plan of Care (Signed)
  Safety: Ability to remain free from injury will improve 07/17/2017 2258 - Progressing by Bani Gianfrancesco, Roma Kayser, RN

## 2017-07-17 NOTE — ED Notes (Signed)
Cardiology at bedside.

## 2017-07-17 NOTE — Plan of Care (Signed)
Patient admitted to [facility] from Emergency Room, VSS.  Patient without complaint, states she feels much improved since receiving IV fluids.  100% on room air.  Respiratory panel swab sent to lab, on droplet precautions at this time.  Family at bedside.

## 2017-07-17 NOTE — ED Provider Notes (Signed)
[place] EMERGENCY DEPARTMENT Provider Note   CSN: [id number] Arrival date & time: 07/17/17  0401     History   Chief Complaint No chief complaint on file.   HPI [name] is a 41 y.o. female.  HPI  41 y.o. female with a hx of Asthma, CHF, Afib, HTN, presents to the Emergency Department today due to hypotension. Pt states that around 3AM she notified EMS due to feeling clammy as well as pale and dizzy. EMS arrived and noted hypotensive with systolic in 86-57Q. Given total 700cc en route with dramatic improvement in pt status and mentation. Pt denies pain currently. No CP/SOB/ABD pain. Notes URI symptoms since Wednesday after being seen in UC. CXR and Flu negative at visit. Given Rx Levaquin that patient has been compliant. Noted fevers with yesterday 102F Oral. No fever today. No meds PTA. Notes productive cough since Wednesday. Also notes N/V/D. Notes intermittent diarrhea. No other symptoms noted   Past Medical History:  Diagnosis Date  . Acute bronchitis and bronchiolitis   . Anemia    Iron supplements in the past  . Asthma    Inhaler prn;triggered by seasonal changes mostly cold weathert;last asthma  attack years ago  . Atrial fibrillation ([place])   . CHF (congestive heart failure) ([place]) 01/2006   Has Pacemaker and Defibrillator;was on heart meds;no longer taking since pregancy  . CHF (congestive heart failure) ([place])   . Hypertension    currently on Labetalol 200mg  BID  . Hypertension   . Infection    UTI;not frequent;last infection was in June  . Infection    Yeast;not frequent  . Migraines    Can't take Procardia XL, causes migraines  . Other primary cardiomyopathies   . Paroxysmal ventricular tachycardia ([place])   . Varicose veins 1998   Developed during last pregnancy    Patient Active Problem List   Diagnosis Date Noted  . 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 CAP (community acquired pneumonia) 09/10/2016  . Genetic testing  07/12/2016  . Family history of ovarian cancer 05/27/2016  . Family history of breast cancer in female 05/27/2016  . Pre-operative cardiovascular examination 12/11/2015  . Cough variant asthma vs UACS 11/19/2015  . Chronic diastolic heart failure ([place]) 10/29/2015  . Dyspnea 10/07/2015  . Chest pain 05/04/2015  . Bradycardia 05/04/2015  . Severe obesity (BMI >= 40) ([place]) 02/27/2014  . OSA (obstructive sleep apnea) 01/25/2014  . Chronic bronchitis ([place]) 01/25/2014  . CHF (congestive heart failure) ([place]) 12/08/2013  . Uncontrolled hypertension 12/08/2013  . Hypertensive crisis 12/12/2012  . Supervision of high-risk pregnancy 06/16/2012  . Low lying placenta with hemorrhage, antepartum 06/16/2012  . Previous cesarean delivery, delivered, with or without mention of antepartum condition 06/16/2012  . undesired fertility 06/16/2012  . History of pre-eclampsia in prior pregnancy, currently pregnant 06/02/2012  . Marginal placenta previa 05/24/2012  . Vaginal bleeding in pregnancy 05/24/2012  . Bilateral ovarian cysts 05/24/2012  . Anemia 05/05/2012  . BV (bacterial vaginosis) 04/04/2012  . Pacemaker 04/04/2012  . Cardiac defibrillator in situ 04/04/2012  . Hypertension complicating pregnancy 46/96/2952  . Cardiomyopathy, hypertensive ([employer]) 02/03/2012  . Dizziness 12/17/2011  . Chronic systolic heart failure ([place]) 04/13/2011  . Hypertension 03/05/2011  . VENTRICULAR TACHYCARDIA, PAROXYSMAL 10/02/2010  . CARDIOMYOPATHY 08/27/2009  . Atrial fibrillation ([place]) 08/27/2009  . CHF 08/27/2009    Past Surgical History:  Procedure Laterality Date  . BREAST REDUCTION SURGERY  03/05/1994   d/t back, shoulder, and neck pain (  19 F)  . CARDIAC CATHETERIZATION     EF 30-35%  . CARDIAC CATHETERIZATION N/A 05/04/2015   Procedure: Left Heart Cath and Coronary Angiography;  Surgeon: Belva Crome, MD;  Location: [facility];  Service: Cardiovascular;  Laterality: N/A;  . CARDIAC DEFIBRILLATOR  PLACEMENT    . CESAREAN SECTION  1998   d/t preeclampsia  . IMPLANTABLE CARDIOVERTER DEFIBRILLATOR (ICD) GENERATOR CHANGE N/A 09/10/2014   Procedure: ICD GENERATOR CHANGE;  Surgeon: Evans Lance, MD;  Location[facility];  Service: Cardiovascular;  Laterality: N/A;  . INSERT / REPLACE / REMOVE PACEMAKER   02/10/2006   Status post implantable cardioverter-defibrillator insertion  .Marland KitchenMarland KitchenMarland KitchenThe Medtronic Maximo VR, model 7232, single chamber  . LAPAROSCOPIC GASTRIC SLEEVE RESECTION WITH HIATAL HERNIA REPAIR N/A 11/24/2016   Procedure: LAPAROSCOPIC GASTRIC SLEEVE RESECTION WITH HIATAL HERNIA REPAIR, UPPER ENDO;  Surgeon: Greer Pickerel, MD;  Location: [facility];  Service: General;  Laterality: N/A;  . LAPAROSCOPIC OVARIAN CYSTECTOMY Bilateral 11/24/2016   Procedure: LAPAROSCOPIC BILATERAL OVARIAN CYSTECTOMY;  Surgeon: Janyth Pupa, DO;  Location: [facility];  Service: Gynecology;  Laterality: Bilateral;    OB History    Gravida Para Term Preterm AB Living   2 1   1   1    SAB TAB Ectopic Multiple Live Births           1       Home Medications    Prior to Admission medications   Medication Sig Start Date End Date Taking? Authorizing Provider  albuterol (PROVENTIL HFA;VENTOLIN HFA) 108 (90 Base) MCG/ACT inhaler Inhale 2 puffs into the lungs every 6 (six) hours as needed for wheezing or shortness of breath. Rescue inhaler Patient not taking: Reported on 07/15/2017 09/12/15   Bensimhon, Shaune Pascal, MD  amLODipine (NORVASC) 2.5 MG tablet Take 1 tablet (2.5 mg total) by mouth daily. 07/15/17   Clegg, Amy D, NP  calcium carbonate (OSCAL) 1500 (600 Ca) MG TABS tablet Take 600 mg of elemental calcium by mouth 3 (three) times daily with meals.    [provider]  cetirizine (ZYRTEC) 10 MG tablet Take 10 mg by mouth daily as needed for allergies.     [provider]  guaiFENesin-dextromethorphan (ROBITUSSIN DM) 100-10 MG/5ML syrup Take 5 mLs by mouth every 6 (six) hours as needed for cough.     [provider]  levofloxacin (LEVAQUIN) 500 MG tablet Take 500 mg by mouth daily. 07/14/17 07/22/17  [provider]  ranitidine (ZANTAC) 150 MG tablet Take 150 mg by mouth 2 (two) times daily.    [provider]  sacubitril-valsartan (ENTRESTO) 49-51 MG Take 1 tablet by mouth 2 (two) times daily. 12/04/16   [name], NP  torsemide (DEMADEX) 20 MG tablet Take 2 tablets (40 mg total) by mouth daily. 12/04/16   [name], NP    Family History Family History  Problem Relation Age of Onset  . Sickle cell trait Daughter   . Other Father        Varicose veins  . Hypertension Father   . Diabetes Father        Controlled w/ diet and exercise  . Colon polyps Father        approx 3-4  . Asthma Daughter   . Seizures Maternal Uncle   . Migraines Mother   . Ulcers Mother   . Rheum arthritis Mother   . Colon polyps Mother        approx 2 polyps  .  Migraines Daughter   . Migraines Cousin        Maternal  . Uterine cancer Paternal Grandmother        d. 60-61  . Prostate cancer Maternal Uncle        dx 69-70; surgery and rad  . Lung cancer Maternal Uncle 65       d. 69-70; smoker  . Lupus Maternal Aunt   . Hypertension Paternal Aunt   . Other Daughter        blood transfusion @ birth;platelets were low  . Diabetes Maternal Aunt   . Lung cancer Maternal Aunt        dx 71-72; +chemo  . Diabetes Paternal Aunt        x 2  . Diabetes Paternal Uncle   . Hypertension Unknown   . Heart disease Unknown   . Cancer Cousin        maternal 1st cousin d. early 54s; NOS cancer  . Breast cancer Cousin        maternal 1st cousin dx 35-48  . Breast cancer Other        maternal great aunt (MGM's sister) d. breast cancer in her late 41s  . Kidney cancer Cousin        paternal 1st cousin dx 37s; nephrectomy; not a smoker  . Ovarian cancer Paternal Aunt 73       paternal aunt (father's maternal half-sister)  . Breast cancer Paternal Aunt   . Ovarian cancer  Paternal Aunt   . Uterine cancer Paternal Aunt   . Cancer Other        paternal great aunt (PGM's sister) dx NOS cancer  . Cancer Other        paternal great uncle (PGM's brother) dx NOS cancer  . Colon cancer Neg Hx     Social History Social History   Tobacco Use  . Smoking status: Never Smoker  . Smokeless tobacco: Never Used  Substance Use Topics  . Alcohol use: No  . Drug use: No     Allergies   Percocet [oxycodone-acetaminophen]   Review of Systems Review of Systems ROS reviewed and all are negative for acute change except as noted in the HPI.  Physical Exam Updated Vital Signs BP (!) 86/42   Pulse (!) 140   Resp 12   Ht 5\' 1"  (1.549 m)   Wt 87.1 kg (192 lb)   SpO2 98%   BMI 36.28 kg/m   Physical Exam  Constitutional: She is oriented to person, place, and time. She appears well-developed and well-nourished. No distress.  HENT:  Head: Normocephalic and atraumatic.  Right Ear: Tympanic membrane, external ear and ear canal normal.  Left Ear: Tympanic membrane, external ear and ear canal normal.  Nose: Nose normal.  Mouth/Throat: Uvula is midline, oropharynx is clear and moist and mucous membranes are normal. No trismus in the jaw. No oropharyngeal exudate, posterior oropharyngeal erythema or tonsillar abscesses.  Eyes: EOM are normal. Pupils are equal, round, and reactive to light.  Neck: Normal range of motion. Neck supple. No tracheal deviation present.  Cardiovascular: Regular rhythm, S1 normal, S2 normal, normal heart sounds, intact distal pulses and normal pulses. Tachycardia present.  Pulmonary/Chest: Effort normal and breath sounds normal. No respiratory distress. She has no decreased breath sounds. She has no wheezes. She has no rhonchi. She has no rales.  Abdominal: Normal appearance and bowel sounds are normal. There is no tenderness.  Musculoskeletal: Normal range of motion.  Neurological: She  is alert and oriented to person, place, and time.  Skin:  Skin is warm and dry.  Psychiatric: She has a normal mood and affect. Her speech is normal and behavior is normal. Thought content normal.  Nursing note and vitals reviewed.  ED Treatments / Results  Labs (all labs ordered are listed, but only abnormal results are displayed) Labs Reviewed  BASIC METABOLIC PANEL - Abnormal; Notable for the following components:      Result Value   Glucose, Bld 132 (*)    Creatinine, Ser 1.75 (*)    Calcium 8.2 (*)    GFR calc non Af Amer 35 (*)    GFR calc Af Amer 40 (*)    All other components within normal limits  I-STAT CG4 LACTIC ACID, ED - Abnormal; Notable for the following components:   Lactic Acid, Venous 2.15 (*)    All other components within normal limits  CBC  BRAIN NATRIURETIC PEPTIDE  URINALYSIS, ROUTINE W REFLEX MICROSCOPIC  I-STAT BETA HCG BLOOD, ED ([facility], [facility], [facility] ONLY)  I-STAT TROPONIN, ED  CBG MONITORING, ED    EKG  EKG Interpretation  Date/Time:  Saturday July 17 2017 04:06:50 EST Ventricular Rate:  138 PR Interval:    QRS Duration: 136 QT Interval:  338 QTC Calculation: 513 R Axis:   24 Text Interpretation:  Sinus tachycardia Prolonged PR interval Consider left atrial enlargement LVH with secondary repolarization abnormality ST depr, consider ischemia, inferior leads Prolonged QT interval When compared with ECG of 09/04/2016, HEART RATE has increased QT has lengthened ST depression in inferior leads is now present - possibly rate-related Confirmed by Delora Fuel ([id number]) on 07/17/2017 4:25:05 AM       Radiology Dg Chest Port 1 View  Result Date: 07/17/2017 CLINICAL DATA:  Acute onset of nausea, vomiting and diarrhea. Hypotension. Fever. EXAM: PORTABLE CHEST 1 VIEW COMPARISON:  Chest radiograph performed 07/14/2017 FINDINGS: The lungs are well-aerated and clear. There is no evidence of focal opacification, pleural effusion or pneumothorax. The cardiomediastinal silhouette is borderline enlarged. An AICD is noted  overlying the left chest wall, with a single lead ending overlying the right ventricle. No acute osseous abnormalities are seen. IMPRESSION: Borderline cardiomegaly.  Lungs remain grossly clear. Electronically Signed   By: Garald Balding M.D.   On: 07/17/2017 04:59    Procedures Procedures (including critical care time)  Medications Ordered in ED Medications  sodium chloride 0.9 % bolus 500 mL (500 mLs Intravenous New Bag/Given 07/17/17 0504)  sodium chloride 0.9 % bolus 1,000 mL (1,000 mLs Intravenous New Bag/Given 07/17/17 0522)  sodium chloride 0.9 % bolus 500 mL (0 mLs Intravenous Stopped 07/17/17 0556)   Initial Impression / Assessment and Plan / ED Course  I have reviewed the triage vital signs and the nursing notes.  Pertinent labs & imaging results that were available during my care of the patient were reviewed by me and considered in my medical decision making (see chart for details).  Final Clinical Impressions(s) / ED Diagnoses  {I have reviewed and evaluated the relevant laboratory values. {I have reviewed and evaluated the relevant imaging studies. {I have interpreted the relevant EKG. {I have reviewed the relevant previous healthcare records. {I have reviewed EMS Documentation. {I obtained HPI from historian. {Patient discussed with supervising physician.  ED Course:  Assessment: Pt is a 41 y.o. female with a hx of Asthma, CHF, Afib, HTN, presents to the Emergency Department today due to hypotension. Pt states that around 3AM she notified EMS due  to feeling clammy as well as pale and dizzy. EMS arrived and noted hypotensive with systolic in 16-10R. Given total 700cc en route with dramatic improvement in pt status and mentation. Pt denies pain currently. No CP/SOB/ABD pain. Notes URI symptoms since Wednesday after being seen in UC. CXR and Flu negative at visit. Given Rx Levaquin that patient has been compliant. Noted fevers with yesterday 102F Oral. No fever today. No meds PTA.  Notes productive cough since Wednesday. Also notes N/V/D. Notes intermittent diarrhea. On exam, pt in NAD. Nontoxic/nonseptic appearing. VS with tachycardia 140s. Sinus tachycardia via EKG. Pressure soft with BP 604 systolic. Afebrile. Lungs CTA. Heart RRR. Abdomen nontender soft. Lactic 2.15. Trop negative. CBC unremarkable. No WBC. CMP with AKI noted with creatinine 1.75. CXR unremarkable. Given additional 1.5L NS bolus in ED to a total of 2700cc. Seen by attending physician. Likely related to hypovolemia. possibel CDiff egiven recent ABX. Review attending physician note for further disposition. Possibility of Afib with RVR as not responding to fluids. Will give trial adenosine to capture rhythm and potentially treat for Afib. CHAD2VASC score 3. See attending physician note for further interventions and disposition.    Disposition/Plan:  See Physician Note  Supervising Physician Delora Fuel, MD  Final diagnoses:  Hypotension, unspecified hypotension type  Tachycardia  AKI (acute kidney injury) [facility])    ED Discharge Orders    None       Shary Decamp, PA-C 07/17/17 0604    Shary Decamp, PA-C 54/09/81 1914    Delora Fuel, MD 78/29/56 [phone]

## 2017-07-17 NOTE — ED Provider Notes (Signed)
Pt signed out by Dr. Roxanne Mins pending cardiology evaluation.  The pt was seen by Dr. Lovena Le in the ED.  She is well known to him from a hx of long standing nonischemic CM, severe LV dysfunction, and chronic systolic CHF.  The pt was in atrial tachycardia, but spontaneously converted to NSR.  After conversion, BP has improved and she has been weaned from the levophed.  Dr. Lovena Le requested that pt be admitted by the hospitalist to be observed to make sure she does not go into any more arrhythmias.  The pt d/w Dr. Evangeline Gula ([facility]) for admission.    Isla Pence, MD 07/17/17 [phone]

## 2017-07-17 NOTE — ED Notes (Signed)
Attempted to call report

## 2017-07-18 DIAGNOSIS — I5032 Chronic diastolic (congestive) heart failure: Secondary | ICD-10-CM

## 2017-07-18 DIAGNOSIS — I959 Hypotension, unspecified: Secondary | ICD-10-CM | POA: Diagnosis present

## 2017-07-18 DIAGNOSIS — I5043 Acute on chronic combined systolic (congestive) and diastolic (congestive) heart failure: Secondary | ICD-10-CM | POA: Diagnosis present

## 2017-07-18 DIAGNOSIS — Z95810 Presence of automatic (implantable) cardiac defibrillator: Secondary | ICD-10-CM | POA: Diagnosis not present

## 2017-07-18 DIAGNOSIS — I471 Supraventricular tachycardia: Secondary | ICD-10-CM | POA: Diagnosis present

## 2017-07-18 DIAGNOSIS — G4733 Obstructive sleep apnea (adult) (pediatric): Secondary | ICD-10-CM | POA: Diagnosis present

## 2017-07-18 DIAGNOSIS — E876 Hypokalemia: Secondary | ICD-10-CM | POA: Diagnosis present

## 2017-07-18 DIAGNOSIS — Z8249 Family history of ischemic heart disease and other diseases of the circulatory system: Secondary | ICD-10-CM | POA: Diagnosis not present

## 2017-07-18 DIAGNOSIS — J101 Influenza due to other identified influenza virus with other respiratory manifestations: Principal | ICD-10-CM

## 2017-07-18 DIAGNOSIS — R002 Palpitations: Secondary | ICD-10-CM | POA: Diagnosis not present

## 2017-07-18 DIAGNOSIS — J45909 Unspecified asthma, uncomplicated: Secondary | ICD-10-CM | POA: Diagnosis present

## 2017-07-18 DIAGNOSIS — I4891 Unspecified atrial fibrillation: Secondary | ICD-10-CM | POA: Diagnosis present

## 2017-07-18 DIAGNOSIS — Z885 Allergy status to narcotic agent status: Secondary | ICD-10-CM | POA: Diagnosis not present

## 2017-07-18 DIAGNOSIS — N179 Acute kidney failure, unspecified: Secondary | ICD-10-CM | POA: Diagnosis present

## 2017-07-18 DIAGNOSIS — R3129 Other microscopic hematuria: Secondary | ICD-10-CM | POA: Diagnosis present

## 2017-07-18 DIAGNOSIS — Z87440 Personal history of urinary (tract) infections: Secondary | ICD-10-CM | POA: Diagnosis not present

## 2017-07-18 DIAGNOSIS — I440 Atrioventricular block, first degree: Secondary | ICD-10-CM | POA: Diagnosis present

## 2017-07-18 DIAGNOSIS — I472 Ventricular tachycardia: Secondary | ICD-10-CM | POA: Diagnosis present

## 2017-07-18 DIAGNOSIS — E861 Hypovolemia: Secondary | ICD-10-CM | POA: Diagnosis present

## 2017-07-18 DIAGNOSIS — Z79899 Other long term (current) drug therapy: Secondary | ICD-10-CM | POA: Diagnosis not present

## 2017-07-18 DIAGNOSIS — R531 Weakness: Secondary | ICD-10-CM | POA: Diagnosis present

## 2017-07-18 DIAGNOSIS — I110 Hypertensive heart disease with heart failure: Secondary | ICD-10-CM | POA: Diagnosis present

## 2017-07-18 DIAGNOSIS — Z825 Family history of asthma and other chronic lower respiratory diseases: Secondary | ICD-10-CM | POA: Diagnosis not present

## 2017-07-18 DIAGNOSIS — I429 Cardiomyopathy, unspecified: Secondary | ICD-10-CM | POA: Diagnosis present

## 2017-07-18 DIAGNOSIS — E860 Dehydration: Secondary | ICD-10-CM | POA: Diagnosis present

## 2017-07-18 LAB — HIV ANTIBODY (ROUTINE TESTING W REFLEX): HIV SCREEN 4TH GENERATION: NONREACTIVE

## 2017-07-18 LAB — BASIC METABOLIC PANEL
ANION GAP: 8 (ref 5–15)
Anion gap: 7 (ref 5–15)
BUN: 11 mg/dL (ref 6–20)
BUN: 12 mg/dL (ref 6–20)
CALCIUM: 7.7 mg/dL — AB (ref 8.9–10.3)
CALCIUM: 7.9 mg/dL — AB (ref 8.9–10.3)
CO2: 28 mmol/L (ref 22–32)
CO2: 30 mmol/L (ref 22–32)
CREATININE: 1.04 mg/dL — AB (ref 0.44–1.00)
CREATININE: 1.08 mg/dL — AB (ref 0.44–1.00)
Chloride: 106 mmol/L (ref 101–111)
Chloride: 104 mmol/L (ref 101–111)
GFR calc Af Amer: >60 mL/min (ref >60)
GFR calc Af Amer: >60 mL/min (ref >60)
GFR calc non Af Amer: >60 mL/min (ref >60)
Glucose, Bld: 105 mg/dL — ABNORMAL HIGH (ref 65–99)
Glucose, Bld: 102 mg/dL — ABNORMAL HIGH (ref 65–99)
Potassium: 2.9 mmol/L — ABNORMAL LOW (ref 3.5–5.1)
Potassium: 2.9 mmol/L — ABNORMAL LOW (ref 3.5–5.1)
SODIUM: 141 mmol/L (ref 135–145)
Sodium: 142 mmol/L (ref 135–145)

## 2017-07-18 LAB — MAGNESIUM
MAGNESIUM: 1.5 mg/dL — AB (ref 1.7–2.4)
Magnesium: 1.9 mg/dL (ref 1.7–2.4)

## 2017-07-18 LAB — TSH: TSH: 1.033 u[IU]/mL (ref 0.350–4.500)

## 2017-07-18 MED ORDER — MAGNESIUM SULFATE 2 GM/50ML IV SOLN
2.0000 g | Freq: Once | INTRAVENOUS | Status: AC
  Administered 2017-07-18: 2 g via INTRAVENOUS
  Filled 2017-07-18: qty 50

## 2017-07-18 MED ORDER — POTASSIUM CHLORIDE CRYS ER 20 MEQ PO TBCR
40.0000 meq | EXTENDED_RELEASE_TABLET | ORAL | Status: AC
  Administered 2017-07-18 (×2): 40 meq via ORAL
  Filled 2017-07-18 (×2): qty 2

## 2017-07-18 MED ORDER — OSELTAMIVIR PHOSPHATE 75 MG PO CAPS
75.0000 mg | ORAL_CAPSULE | Freq: Two times a day (BID) | ORAL | Status: DC
  Administered 2017-07-18 – 2017-07-19 (×3): 75 mg via ORAL
  Filled 2017-07-18 (×3): qty 1

## 2017-07-18 NOTE — Progress Notes (Signed)
PROGRESS NOTE   [name]  YNW:[id number]    DOB: 06-Mar-1976    DOA: 07/17/2017  PCP: Maurice Small, MD   I have briefly reviewed patients previous medical [facility]  Brief Narrative:  41 year old female with a PMH of NICM (cath 2007 & 2016 without CAD), chronic systolic CHF, VT s/p ICD, since then EF normalized by echo July 2018, A. fib, HTN, recently started on levofloxacin for acute bronchitis (had been sick for last 3 days with fevers as high as 102), lives with her daughters, presented to ED with complaints of generalized malaise, feeling clammy, pale and dizzy. EMS noted hypotension with SBP is in the 80s-90s. She was hydrated aggressively with IV fluids and subsequently got a dose of adenosine and reverted to sinus rhythm. She briefly required pressors for hypotension. Cardiology was consulted.   Assessment & Plan:   Principal Problem:   Hypotension Active Problems:   Nonischemic cardiomyopathy (HCC)   Pacemaker   Cardiac defibrillator in situ   Viral upper respiratory tract infection with cough   OSA (obstructive sleep apnea)   Chronic diastolic heart failure (HCC)   Tachycardia   1. Hypotension: Likely multifactorial secondary to recent acute URTI related dehydration, atrial tachycardia, antihypertensives and diuretics. Aggressively hydrated with IV fluids in ED. Briefly required pressors, now off. Resolved. 2. Atrial tachycardia: EP Cardiology input appreciated. I discussed with Dr. Lovena Le. No change in current medications as below. As per cardiology, if she has recurrent symptoms, may consider beta blockers. 3. Chronic diastolic CHF: She has history of chronic systolic CHF with recent normalization of her LV function by echo in July 2018. Continue Demadex and Entresto. Outpatient follow-up with advanced heart failure clinic. Clinically euvolemic. 4. Influenza A with respiratory manifestations: Patient reported that recent flu testing at urgent care was  negative. However here RSV panel confirmed flu a. Started Tamiflu and complete 5 days treatment. Supportive treatment. Levaquin discontinued. 5. Hypokalemia/hypomagnesemia: Severe hypokalemia/2.9, magnesium 1.5. Replace magnesium. Despite replacement, potassium remains low. Replace aggressively and follow BMP in a.m. Barrier to discharge today. 6. OSA: 7. S/p AICD: Per cardiology.   DVT prophylaxis: Lovenox Code Status: Full Family Communication: None at bedside Disposition: DC home, possibly 12/24.   Consultants:  EP Cardiology   Procedures:  None  Antimicrobials:  None    Subjective: Feels much better. Has mild dry cough. No further fevers. No chest pain , dyspnea, fever or chills. Reports that she lives with her 2 daughters aged ~ 7 and 4 who are well without illness at this time.  ROS: As above  Objective:  Vitals:   07/17/17 1359 07/17/17 1942 07/17/17 2323 07/18/17 0545  BP: 100/75 119/70 123/76   Pulse: 87 73 87   Resp: 18 18 18    Temp: 98.7 F (37.1 C) 97.6 F (36.4 C) 98.5 F (36.9 C) 98.2 F (36.8 C)  TempSrc:  Oral Oral Oral  SpO2: 100% 98% 97% 97%  Weight: 92.5 kg (204 lb)   88.8 kg (195 lb 11.2 oz)  Height: 5\' 1"  (1.549 m)       Examination:  General exam: Young female, moderately built and nourished, lying comfortably supine in bed. Does not look septic or toxic. Oral mucosa moist.  Respiratory system: Clear to auscultation. Respiratory effort normal. Cardiovascular system: S1 & S2 heard, RRR. No JVD, murmurs, rubs, gallops or clicks. No pedal edema.Telemetry personally reviewed: She was in atrial tachycardia in the ED and reverted to sinus rhythm on 12/20 point  approximately 8 AM. Has been in sinus rhythm since.  Gastrointestinal system: Abdomen is nondistended, soft and nontender. No organomegaly or masses felt. Normal bowel sounds heard. Central nervous system: Alert and oriented. No focal neurological deficits. Extremities: Symmetric 5 x 5  power. Skin: No rashes, lesions or ulcers Psychiatry: Judgement and insight appear normal. Mood & affect appropriate.     Data Reviewed: I have personally reviewed following labs and imaging studies  CBC: Recent Labs  Lab 07/17/17 0402  WBC 4.9  HGB 13.7  HCT 42.3  MCV 84.1  PLT 161   Basic Metabolic Panel: Recent Labs  Lab 07/17/17 0402 07/18/17 0435 07/18/17 1416  NA 140 141 142  K 3.8 2.9* 2.9*  CL 103 106 104  CO2 24 28 30   GLUCOSE 132* 105* 102*  BUN 19 11 12   CREATININE 1.75* 1.04* 1.08*  CALCIUM 8.2* 7.7* 7.9*  MG  --  1.5* 1.9     Recent Results (from the past 240 hour(s))  Respiratory Panel by PCR     Status: Abnormal   Collection Time: 07/17/17  2:33 PM  Result Value Ref Range Status   Adenovirus NOT DETECTED NOT DETECTED Final   Coronavirus 229E NOT DETECTED NOT DETECTED Final   Coronavirus HKU1 NOT DETECTED NOT DETECTED Final   Coronavirus NL63 NOT DETECTED NOT DETECTED Final   Coronavirus OC43 NOT DETECTED NOT DETECTED Final   Metapneumovirus NOT DETECTED NOT DETECTED Final   Rhinovirus / Enterovirus NOT DETECTED NOT DETECTED Final   Influenza A H1 2009 DETECTED (A) NOT DETECTED Final   Influenza B NOT DETECTED NOT DETECTED Final   Parainfluenza Virus 1 NOT DETECTED NOT DETECTED Final   Parainfluenza Virus 2 NOT DETECTED NOT DETECTED Final   Parainfluenza Virus 3 NOT DETECTED NOT DETECTED Final   Parainfluenza Virus 4 NOT DETECTED NOT DETECTED Final   Respiratory Syncytial Virus NOT DETECTED NOT DETECTED Final   Bordetella pertussis NOT DETECTED NOT DETECTED Final   Chlamydophila pneumoniae NOT DETECTED NOT DETECTED Final   Mycoplasma pneumoniae NOT DETECTED NOT DETECTED Final         Radiology Studies: Dg Chest Port 1 View  Result Date: 07/17/2017 CLINICAL DATA:  Acute onset of nausea, vomiting and diarrhea. Hypotension. Fever. EXAM: PORTABLE CHEST 1 VIEW COMPARISON:  Chest radiograph performed 07/14/2017 FINDINGS: The lungs are  well-aerated and clear. There is no evidence of focal opacification, pleural effusion or pneumothorax. The cardiomediastinal silhouette is borderline enlarged. An AICD is noted overlying the left chest wall, with a single lead ending overlying the right ventricle. No acute osseous abnormalities are seen. IMPRESSION: Borderline cardiomegaly.  Lungs remain grossly clear. Electronically Signed   By: Garald Balding M.D.   On: 07/17/2017 04:59        Scheduled Meds: . amLODipine  2.5 mg Oral Daily  . calcium carbonate  600 mg of elemental calcium Oral TID WC  . enoxaparin (LOVENOX) injection  40 mg Subcutaneous Q24H  . loratadine  10 mg Oral Daily  . multivitamin with minerals  1 tablet Oral Daily  . oseltamivir  75 mg Oral BID  . potassium chloride  40 mEq Oral Q4H  . sacubitril-valsartan  1 tablet Oral BID  . sodium chloride flush  3 mL Intravenous Q12H  . torsemide  40 mg Oral Daily   Continuous Infusions:   LOS: 0 days     Vernell Leep, MD, FACP, [facility]. [facility] Pager [phone]  If 7PM-7AM, please contact night-coverage [email or web address] Password [email or web address]  07/18/2017, 3:32 PM

## 2017-07-18 NOTE — Progress Notes (Signed)
Progress Note  Patient Name: [name] Date of Encounter: 07/18/2017  Primary Cardiologist:DB  Subjective   "I feel better"  Inpatient Medications    Scheduled Meds: . amLODipine  2.5 mg Oral Daily  . calcium carbonate  600 mg of elemental calcium Oral TID WC  . enoxaparin (LOVENOX) injection  40 mg Subcutaneous Q24H  . loratadine  10 mg Oral Daily  . multivitamin with minerals  1 tablet Oral Daily  . oseltamivir  75 mg Oral BID  . potassium chloride  40 mEq Oral Q4H  . sacubitril-valsartan  1 tablet Oral BID  . sodium chloride flush  3 mL Intravenous Q12H  . torsemide  40 mg Oral Daily   Continuous Infusions:  PRN Meds: acetaminophen **OR** acetaminophen, guaiFENesin, levalbuterol, ondansetron **OR** ondansetron (ZOFRAN) IV, traMADol   Vital Signs    Vitals:   07/17/17 1359 07/17/17 1942 07/17/17 2323 07/18/17 0545  BP: 100/75 119/70 123/76   Pulse: 87 73 87   Resp: 18 18 18    Temp: 98.7 F (37.1 C) 97.6 F (36.4 C) 98.5 F (36.9 C) 98.2 F (36.8 C)  TempSrc:  Oral Oral Oral  SpO2: 100% 98% 97% 97%  Weight: 204 lb (92.5 kg)   195 lb 11.2 oz (88.8 kg)  Height: 5\' 1"  (1.549 m)       Intake/Output Summary (Last 24 hours) at 07/18/2017 1036 Last data filed at 07/18/2017 0302 Gross per 24 hour  Intake 1240 ml  Output 5100 ml  Net -3860 ml   Filed Weights   07/17/17 0412 07/17/17 1359 07/18/17 0545  Weight: 192 lb (87.1 kg) 204 lb (92.5 kg) 195 lb 11.2 oz (88.8 kg)    Telemetry    Nsr/sinus tachy - Personally Reviewed  ECG    none - Personally Reviewed  Physical Exam   GEN: No acute distress.   Neck: 6 cm JVD Cardiac: RRR, no murmurs, rubs, or gallops.  Respiratory: Clear to auscultation bilaterally. GI: Soft, nontender, non-distended  MS: No edema; No deformity. Neuro:  Nonfocal  Psych: Normal affect   Labs    Chemistry Recent Labs  Lab 07/17/17 0402 07/18/17 0435  NA 140 141  K 3.8 2.9*  CL 103 106  CO2 24 28  GLUCOSE 132*  105*  BUN 19 11  CREATININE 1.75* 1.04*  CALCIUM 8.2* 7.7*  GFRNONAA 35* >60  GFRAA 40* >60  ANIONGAP 13 7     Hematology Recent Labs  Lab 07/17/17 0402  WBC 4.9  RBC 5.03  HGB 13.7  HCT 42.3  MCV 84.1  MCH 27.2  MCHC 32.4  RDW 15.5  PLT 213    Cardiac EnzymesNo results for input(s): TROPONINI in the last 168 hours.  Recent Labs  Lab 07/17/17 0438  TROPIPOC 0.01     BNP Recent Labs  Lab 07/17/17 0439  BNP 36.7     DDimer No results for input(s): DDIMER in the last 168 hours.   Radiology    Dg Chest Port 1 View  Result Date: 07/17/2017 CLINICAL DATA:  Acute onset of nausea, vomiting and diarrhea. Hypotension. Fever. EXAM: PORTABLE CHEST 1 VIEW COMPARISON:  Chest radiograph performed 07/14/2017 FINDINGS: The lungs are well-aerated and clear. There is no evidence of focal opacification, pleural effusion or pneumothorax. The cardiomediastinal silhouette is borderline enlarged. An AICD is noted overlying the left chest wall, with a single lead ending overlying the right ventricle. No acute osseous abnormalities are seen. IMPRESSION: Borderline cardiomegaly.  Lungs remain grossly  clear. Electronically Signed   By: Garald Balding M.D.   On: 07/17/2017 04:59    Cardiac Studies   none  Patient Profile     41 y.o. female admitted with atrial tachycardia associated with hypotension, initially requiring pressors, found to have the flu.  Assessment & Plan    1. Atrial tachycardia -she has had no recurrent atrial arrhythmias. She will continue her current meds. She is not on a beta blocker but could be put on one if she has recurrent symptoms. 2. CHF - she has a h/o chronic systolic heart failure with recent normalization of her LV function. She will go home on her current CHF meds.  3. Influenza - she has been treated with tamiflu. She feels better so hopefully this will be a self limited illness. For questions or updates, please contact [place] Please consult  [email or web address] for contact info under Cardiology/STEMI.      Signed, Cristopher Peru, MD  07/18/2017, 10:36 AM  Patient ID: [name], female   DOB: 1976/05/14, 41 y.o.   MRN: [id number]

## 2017-07-19 DIAGNOSIS — R000 Tachycardia, unspecified: Secondary | ICD-10-CM

## 2017-07-19 DIAGNOSIS — I952 Hypotension due to drugs: Secondary | ICD-10-CM

## 2017-07-19 DIAGNOSIS — I4719 Other supraventricular tachycardia: Secondary | ICD-10-CM

## 2017-07-19 DIAGNOSIS — I9589 Other hypotension: Secondary | ICD-10-CM

## 2017-07-19 DIAGNOSIS — E861 Hypovolemia: Secondary | ICD-10-CM

## 2017-07-19 DIAGNOSIS — J101 Influenza due to other identified influenza virus with other respiratory manifestations: Secondary | ICD-10-CM

## 2017-07-19 DIAGNOSIS — I471 Supraventricular tachycardia: Secondary | ICD-10-CM

## 2017-07-19 LAB — BASIC METABOLIC PANEL WITH GFR
Anion gap: 8 (ref 5–15)
BUN: 14 mg/dL (ref 6–20)
CO2: 30 mmol/L (ref 22–32)
Calcium: 8.4 mg/dL — ABNORMAL LOW (ref 8.9–10.3)
Chloride: 106 mmol/L (ref 101–111)
Creatinine, Ser: 1.13 mg/dL — ABNORMAL HIGH (ref 0.44–1.00)
GFR calc Af Amer: >60 mL/min
GFR calc non Af Amer: 59 mL/min — ABNORMAL LOW
Glucose, Bld: 91 mg/dL (ref 65–99)
Potassium: 3.9 mmol/L (ref 3.5–5.1)
Sodium: 144 mmol/L (ref 135–145)

## 2017-07-19 MED ORDER — CALCIUM CARBONATE 1250 (500 CA) MG PO TABS: 1.0000 | ORAL_TABLET | Freq: Three times a day (TID) | ORAL | Status: DC

## 2017-07-19 MED ORDER — OSELTAMIVIR PHOSPHATE 75 MG PO CAPS: 75.0000 mg | ORAL_CAPSULE | Freq: Two times a day (BID) | ORAL | 0 refills | Status: DC

## 2017-07-19 MED FILL — OSELTAMIVIR PHOSPHATE 75 MG: 75 | 4 days supply | Qty: 7 | Fill #0

## 2017-07-19 NOTE — Discharge Instructions (Signed)

## 2017-07-19 NOTE — Progress Notes (Signed)
The patient has been seen in conjunction with Cecilie Kicks, NP. All aspects of care have been considered and discussed. The patient has been personally interviewed, examined, and all clinical data has been reviewed.   Patient is doing well.  Will arrange follow-up with heart failure service.  Overall, no clinical evidence of heart failure and no recurrence of atrial tachycardia.  No change in therapy.   Progress Note  Patient Name: [name] Date of Encounter: 07/19/2017  Primary Cardiologist: Glori Bickers, MD  EP Dr. Lovena Le  Subjective   No chest pain and no SOB, does have congested cough   Inpatient Medications    Scheduled Meds: . amLODipine  2.5 mg Oral Daily  . calcium carbonate  1 tablet Oral TID WC  . enoxaparin (LOVENOX) injection  40 mg Subcutaneous Q24H  . loratadine  10 mg Oral Daily  . multivitamin with minerals  1 tablet Oral Daily  . oseltamivir  75 mg Oral BID  . sacubitril-valsartan  1 tablet Oral BID  . sodium chloride flush  3 mL Intravenous Q12H  . torsemide  40 mg Oral Daily   Continuous Infusions:  PRN Meds: acetaminophen **OR** acetaminophen, guaiFENesin, levalbuterol, ondansetron **OR** ondansetron (ZOFRAN) IV, traMADol   Vital Signs    Vitals:   07/18/17 1950 07/19/17 0017 07/19/17 0438 07/19/17 0821  BP: 119/78 122/77 122/78 (!) 132/94  Pulse: 77 84 69 76  Resp: 18 18 18    Temp: 98.5 F (36.9 C) 98.5 F (36.9 C) 98 F (36.7 C)   TempSrc: Oral Oral Oral   SpO2: 97% 99% 99% 98%  Weight:   195 lb 6.4 oz (88.6 kg)   Height:        Intake/Output Summary (Last 24 hours) at 07/19/2017 0930 Last data filed at 07/19/2017 0827 Gross per 24 hour  Intake 1403 ml  Output 2340 ml  Net -937 ml   Filed Weights   07/17/17 1359 07/18/17 0545 07/19/17 0438  Weight: 204 lb (92.5 kg) 195 lb 11.2 oz (88.8 kg) 195 lb 6.4 oz (88.6 kg)    Telemetry     SR- Personally Reviewed  ECG    No new - Personally Reviewed  Physical Exam    GEN: No acute distress.   Neck: No JVD Cardiac: RRR, no murmurs, rubs, or gallops.  Respiratory: bilateral breath to auscultation bilaterally.  Does have rhonchi no rales GI: Soft, nontender, non-distended  MS: No edema; No deformity. Neuro:  Nonfocal  Psych: Normal affect     Labs    Chemistry Recent Labs  Lab 07/18/17 0435 07/18/17 1416 07/19/17 0558  NA 141 142 144  K 2.9* 2.9* 3.9  CL 106 104 106  CO2 28 30 30   GLUCOSE 105* 102* 91  BUN 11 12 14   CREATININE 1.04* 1.08* 1.13*  CALCIUM 7.7* 7.9* 8.4*  GFRNONAA >60 >60 59*  GFRAA >60 >60 >60  ANIONGAP 7 8 8      Hematology Recent Labs  Lab 07/17/17 0402  WBC 4.9  RBC 5.03  HGB 13.7  HCT 42.3  MCV 84.1  MCH 27.2  MCHC 32.4  RDW 15.5  PLT 213    Cardiac EnzymesNo results for input(s): TROPONINI in the last 168 hours.  Recent Labs  Lab 07/17/17 0438  TROPIPOC 0.01     BNP Recent Labs  Lab 07/17/17 0439  BNP 36.7     DDimer No results for input(s): DDIMER in the last 168 hours.   Radiology  No results found.  Cardiac Studies   none  Patient Profile     41 y.o. female admitted with atrial tachycardia associated with hypotension, initially requiring pressors, found to have the flu.  Now SR.    Assessment & Plan    Atrial tachycardia, no recurrent atrial arrhythmias  If recurrent symptoms Dr. Lovena Le recommended adding BB.  CHF with hx of chronic systolic HF in 7471 and recent normalization of her LV function to 55-60%.  Neg. 4.7 L since admit and wt down from 204 to 195 lbs.  Troponin here 0.01 BNP 36 Followed by HF team, will ask them to see in 2 weeks.  Influenza- treated with tamiflu    For questions or updates, please contact [place] Please consult [email or web address] for contact info under Cardiology/STEMI.      Signed, Cecilie Kicks, NP  07/19/2017, 9:30 AM

## 2017-07-19 NOTE — Progress Notes (Signed)
Reviewed discharge instructions with pt. Answered her questions. Pt is stable and ready for discharge. Waiting on note for work from Dr. Then will be ready for discharge.

## 2017-07-19 NOTE — Discharge Summary (Signed)
Physician Discharge Summary  [name] QIW:[id number] DOB: 1976-03-03  PCP: Maurice Small, MD  Admit date: 07/17/2017 Discharge date: 07/19/2017  Recommendations for Outpatient Follow-up:  1. Dr. Maurice Small, PCP in one week with repeat labs (CBC & BMP). 2. Dr. Glori Bickers, Cardiology in 2 weeks.  Home Health: None Equipment/Devices: None    Discharge Condition: Improved and stable  CODE STATUS: Full  Diet recommendation: Heart healthy diet.  Discharge Diagnoses:  Principal Problem:   Hypotension Active Problems:   Nonischemic cardiomyopathy (HCC)   Pacemaker   Cardiac defibrillator in situ   Viral upper respiratory tract infection with cough   OSA (obstructive sleep apnea)   Chronic diastolic heart failure (HCC)   Tachycardia   Atrial tachycardia (HCC)   Influenza A with respiratory manifestations   Brief Summary: 41 year old female with a PMH of NICM (cath 2007 & 2016 without CAD), prior chronic systolic CHF, VT s/p ICD, since then EF normalized by echo July 2018, A. fib, HTN, recently started on levofloxacin for acute bronchitis (had been sick for last 3 days pta with fevers as high as 102), works at a [employer] and lives with her daughters, presented to ED with complaints of generalized malaise, feeling clammy, pale and dizzy. EMS noted hypotension with SBP is in the 80s-90s. She was hydrated aggressively with IV fluids.  She got a dose of adenosine for atrial tachycardia and reverted to sinus rhythm. She briefly required pressors for hypotension. Cardiology was consulted.   Assessment & Plan:   1. Hypotension: Likely multifactorial secondary to recent flu related acute URTI & dehydration, atrial tachycardia, antihypertensives and diuretics. Aggressively hydrated with IV fluids in ED. Briefly required pressors, now off since ED. Resolved without recurrence. 2. Atrial tachycardia: EP Cardiology input appreciated. No change in current medications as below. As per  Cardiology, if she has recurrent symptoms, may consider beta blockers. No further arrhythmias noted on telemetry since ED. TSH normal at 1.033. 3. Chronic diastolic CHF: She has history of chronic systolic CHF with recent normalization of her LV function by echo in July 2018. Continue Demadex and Entresto. Outpatient follow-up with advanced heart failure clinic. Clinically euvolemic. Weight had gone up from 192 on admission to 204 but now back down to 195 pounds. 4. Influenza A with respiratory manifestations: Patient reported that recent flu testing at urgent care was negative. However here RSV panel confirmed flu A. Started Tamiflu and complete 5 days treatment. Supportive treatment. Levaquin discontinued. Clinically improved. HIV screen: Nonreactive. I-STAT hCG negative. 5. Hypokalemia/hypomagnesemia: Severe hypokalemia/2.9, magnesium 1.5. These were aggressively replaced and normalized. 6. Acute kidney injury: Baseline creatinine normal. Presented with creatinine of 1.75. Multifactorial due to dehydration, diuretics and Entresto. This had resolved. Creatinine has gone up slightly from 1.08-1.13, likely related to resumption of diuretics and Entresto. Close follow-up of BMP as outpatient. 7. OSA: 8. S/p AICD: Per cardiology. 9. Essential hypertension: Reasonably controlled. Continue prior home medications. 10. Microscopic hematuria: Unclear etiology. Recommend follow-up as outpatient and repeat urine microscopy in a couple of weeks.    Consultants:  EP Cardiology   Procedures:  None   Discharge Instructions  Discharge Instructions    (HEART FAILURE PATIENTS) Call MD:  Anytime you have any of the following symptoms: 1) 3 pound weight gain in 24 hours or 5 pounds in 1 week 2) shortness of breath, with or without a dry hacking cough 3) swelling in the hands, feet or stomach 4) if you have to sleep on extra pillows at night  in order to breathe.   Complete by:  As directed    Call MD for:    Complete by:  As directed    Heart racing or palpitations.   Call MD for:  difficulty breathing, headache or visual disturbances   Complete by:  As directed    Call MD for:  extreme fatigue   Complete by:  As directed    Call MD for:  persistant dizziness or light-headedness   Complete by:  As directed    Call MD for:  temperature >100.4   Complete by:  As directed    Diet - low sodium heart healthy   Complete by:  As directed    Increase activity slowly   Complete by:  As directed        Medication List    STOP taking these medications   levofloxacin 500 MG tablet Commonly known as:  LEVAQUIN     TAKE these medications   albuterol 108 (90 Base) MCG/ACT inhaler Commonly known as:  PROVENTIL HFA;VENTOLIN HFA Inhale 2 puffs into the lungs every 6 (six) hours as needed for wheezing or shortness of breath. Rescue inhaler   amLODipine 2.5 MG tablet Commonly known as:  NORVASC Take 1 tablet (2.5 mg total) by mouth daily.   calcium carbonate 1500 (600 Ca) MG Tabs tablet Commonly known as:  OSCAL Take 600 mg of elemental calcium by mouth 3 (three) times daily with meals.   cetirizine 10 MG tablet Commonly known as:  ZYRTEC Take 10 mg by mouth daily as needed for allergies.   guaiFENesin-dextromethorphan 100-10 MG/5ML syrup Commonly known as:  ROBITUSSIN DM Take 5 mLs by mouth every 6 (six) hours as needed for cough.   multivitamin with minerals Tabs tablet Take 1 tablet by mouth daily.   oseltamivir 75 MG capsule Commonly known as:  TAMIFLU Take 1 capsule (75 mg total) by mouth 2 (two) times daily.   sacubitril-valsartan 49-51 MG Commonly known as:  ENTRESTO Take 1 tablet by mouth 2 (two) times daily.   torsemide 20 MG tablet Commonly known as:  DEMADEX Take 2 tablets (40 mg total) by mouth daily.      Follow-up Information    Maurice Small, MD. Schedule an appointment as soon as possible for a visit in 1 week(s).   Specialty:  Family Medicine Why:  To be seen  with repeat labs (CBC & BMP). Contact information: [place] [address] [phone]        Bensimhon, Shaune Pascal, MD. Schedule an appointment as soon as possible for a visit in 2 week(s).   Specialty:  Cardiology Contact information: [place] [address] [phone]          Allergies  Allergen Reactions  . Percocet [Oxycodone-Acetaminophen] Shortness Of Breath    Tolerates acetaminophen      Procedures/Studies: Dg Chest Port 1 View  Result Date: 07/17/2017 CLINICAL DATA:  Acute onset of nausea, vomiting and diarrhea. Hypotension. Fever. EXAM: PORTABLE CHEST 1 VIEW COMPARISON:  Chest radiograph performed 07/14/2017 FINDINGS: The lungs are well-aerated and clear. There is no evidence of focal opacification, pleural effusion or pneumothorax. The cardiomediastinal silhouette is borderline enlarged. An AICD is noted overlying the left chest wall, with a single lead ending overlying the right ventricle. No acute osseous abnormalities are seen. IMPRESSION: Borderline cardiomegaly.  Lungs remain grossly clear. Electronically Signed   By: Garald Balding M.D.   On: 07/17/2017 04:59  Subjective: Patient reports feeling much per her. No chest pain, dyspnea, palpitations, dizziness or lightheadedness reported. Has mild intermittent dry cough. No body aches, headaches, fever or chills. Reports twice daily diarrhea (loose to watery stools) for approximately 6 days that started prior to her starting antibiotics. Advised her that this may be due to the influenza and less likely related to antibiotics but advised probiotic yogurts, adequate oral hydration and follow up with PCP if it doesn't resolve or if it gets worse. She verbalized understanding. She was also advised that she may return to work on Friday as long as she did not have any recurrence of fever and continued to feel better.  Discharge Exam:  Vitals:    07/18/17 1950 07/19/17 0017 07/19/17 0438 07/19/17 0821  BP: 119/78 122/77 122/78 (!) 132/94  Pulse: 77 84 69 76  Resp: 18 18 18    Temp: 98.5 F (36.9 C) 98.5 F (36.9 C) 98 F (36.7 C)   TempSrc: Oral Oral Oral   SpO2: 97% 99% 99% 98%  Weight:   88.6 kg (195 lb 6.4 oz)   Height:        General exam: Young female, moderately built and nourished, seen ambulating comfortably in the room. Respiratory system: Occasional basal crackles but otherwise clear to auscultation without wheezing or rhonchi. No increased work of breathing. Cardiovascular system: S1 & S2 heard, RRR. No JVD, murmurs, rubs, gallops or clicks. No pedal edema.Telemetry personally reviewed: She was in atrial tachycardia in the ED and reverted to sinus rhythm on 12/22 at approximately 8 AM. Has been in sinus rhythm since without arrhythmias.  Gastrointestinal system: Abdomen is nondistended, soft and nontender. No organomegaly or masses felt. Normal bowel sounds heard. Central nervous system: Alert and oriented. No focal neurological deficits. Extremities: Symmetric 5 x 5 power. Skin: No rashes, lesions or ulcers Psychiatry: Judgement and insight appear normal. Mood & affect appropriate.       The results of significant diagnostics from this hospitalization (including imaging, microbiology, ancillary and laboratory) are listed below for reference.     Microbiology: Recent Results (from the past 240 hour(s))  Respiratory Panel by PCR     Status: Abnormal   Collection Time: 07/17/17  2:33 PM  Result Value Ref Range Status   Adenovirus NOT DETECTED NOT DETECTED Final   Coronavirus 229E NOT DETECTED NOT DETECTED Final   Coronavirus HKU1 NOT DETECTED NOT DETECTED Final   Coronavirus NL63 NOT DETECTED NOT DETECTED Final   Coronavirus OC43 NOT DETECTED NOT DETECTED Final   Metapneumovirus NOT DETECTED NOT DETECTED Final   Rhinovirus / Enterovirus NOT DETECTED NOT DETECTED Final   Influenza A H1 2009 DETECTED (A) NOT  DETECTED Final   Influenza B NOT DETECTED NOT DETECTED Final   Parainfluenza Virus 1 NOT DETECTED NOT DETECTED Final   Parainfluenza Virus 2 NOT DETECTED NOT DETECTED Final   Parainfluenza Virus 3 NOT DETECTED NOT DETECTED Final   Parainfluenza Virus 4 NOT DETECTED NOT DETECTED Final   Respiratory Syncytial Virus NOT DETECTED NOT DETECTED Final   Bordetella pertussis NOT DETECTED NOT DETECTED Final   Chlamydophila pneumoniae NOT DETECTED NOT DETECTED Final   Mycoplasma pneumoniae NOT DETECTED NOT DETECTED Final     Labs: CBC: Recent Labs  Lab 07/17/17 0402  WBC 4.9  HGB 13.7  HCT 42.3  MCV 84.1  PLT 892   Basic Metabolic Panel: Recent Labs  Lab 07/17/17 0402 07/18/17 0435 07/18/17 1416 07/19/17 0558  NA 140 141 142 144  K  3.8 2.9* 2.9* 3.9  CL 103 106 104 106  CO2 24 28 30 30   GLUCOSE 132* 105* 102* 91  BUN 19 11 12 14   CREATININE 1.75* 1.04* 1.08* 1.13*  CALCIUM 8.2* 7.7* 7.9* 8.4*  MG  --  1.5* 1.9  --    BNP (last 3 results) Recent Labs    07/17/17 0439  BNP 36.7   Thyroid function studies Recent Labs    07/18/17 0435  TSH 1.033   Urinalysis    Component Value Date/Time   COLORURINE YELLOW 07/17/2017 0835   APPEARANCEUR HAZY (A) 07/17/2017 0835   LABSPEC 1.016 07/17/2017 0835   PHURINE 5.0 07/17/2017 0835   GLUCOSEU NEGATIVE 07/17/2017 0835   HGBUR SMALL (A) 07/17/2017 0835   BILIRUBINUR NEGATIVE 07/17/2017 0835   BILIRUBINUR negative 04/04/2012 1545   KETONESUR 20 (A) 07/17/2017 0835   PROTEINUR 100 (A) 07/17/2017 0835   UROBILINOGEN 0.2 12/08/2013 0540   NITRITE NEGATIVE 07/17/2017 0835   LEUKOCYTESUR NEGATIVE 07/17/2017 0835   Discussed with patient's daughter at bedside.   Time coordinating discharge: Over 30 minutes  SIGNED:  Vernell Leep, MD, FACP, [facility]. [facility] Pager [phone]  If 7PM-7AM, please contact night-coverage [email or web address] Password [facility] 07/19/2017, 10:48 AM

## 2017-07-29 ENCOUNTER — Encounter (HOSPITAL_COMMUNITY): Payer: Self-pay

## 2017-08-06 ENCOUNTER — Inpatient Hospital Stay (HOSPITAL_COMMUNITY): Payer: Self-pay

## 2017-08-17 ENCOUNTER — Encounter (HOSPITAL_COMMUNITY): Payer: Self-pay

## 2017-08-17 ENCOUNTER — Ambulatory Visit (HOSPITAL_COMMUNITY)
Admission: RE | Admit: 2017-08-17 | Discharge: 2017-08-17 | Disposition: A | Discharge status: Home / Self Care | Payer: Managed Care, Other (non HMO) | Source: Ambulatory Visit | Attending: Cardiology | Admitting: Cardiology

## 2017-08-17 ENCOUNTER — Telehealth (HOSPITAL_COMMUNITY): Payer: Self-pay | Admitting: Cardiology

## 2017-08-17 VITALS — BP 151/84 | HR 70 | Wt 196.0 lb

## 2017-08-17 DIAGNOSIS — I509 Heart failure, unspecified: Secondary | ICD-10-CM

## 2017-08-17 DIAGNOSIS — Z09 Encounter for follow-up examination after completed treatment for conditions other than malignant neoplasm: Secondary | ICD-10-CM | POA: Insufficient documentation

## 2017-08-17 DIAGNOSIS — Z95810 Presence of automatic (implantable) cardiac defibrillator: Secondary | ICD-10-CM | POA: Diagnosis not present

## 2017-08-17 DIAGNOSIS — I10 Essential (primary) hypertension: Secondary | ICD-10-CM | POA: Diagnosis not present

## 2017-08-17 DIAGNOSIS — E669 Obesity, unspecified: Secondary | ICD-10-CM | POA: Diagnosis not present

## 2017-08-17 DIAGNOSIS — J42 Unspecified chronic bronchitis: Secondary | ICD-10-CM | POA: Diagnosis not present

## 2017-08-17 DIAGNOSIS — I428 Other cardiomyopathies: Secondary | ICD-10-CM | POA: Diagnosis not present

## 2017-08-17 DIAGNOSIS — I110 Hypertensive heart disease with heart failure: Secondary | ICD-10-CM | POA: Insufficient documentation

## 2017-08-17 DIAGNOSIS — I5042 Chronic combined systolic (congestive) and diastolic (congestive) heart failure: Secondary | ICD-10-CM | POA: Insufficient documentation

## 2017-08-17 DIAGNOSIS — Z79899 Other long term (current) drug therapy: Secondary | ICD-10-CM | POA: Insufficient documentation

## 2017-08-17 DIAGNOSIS — I5032 Chronic diastolic (congestive) heart failure: Secondary | ICD-10-CM | POA: Diagnosis not present

## 2017-08-17 DIAGNOSIS — J09X2 Influenza due to identified novel influenza A virus with other respiratory manifestations: Secondary | ICD-10-CM | POA: Diagnosis not present

## 2017-08-17 DIAGNOSIS — J45909 Unspecified asthma, uncomplicated: Secondary | ICD-10-CM | POA: Diagnosis not present

## 2017-08-17 DIAGNOSIS — G4733 Obstructive sleep apnea (adult) (pediatric): Secondary | ICD-10-CM | POA: Diagnosis not present

## 2017-08-17 DIAGNOSIS — I4891 Unspecified atrial fibrillation: Secondary | ICD-10-CM | POA: Diagnosis not present

## 2017-08-17 DIAGNOSIS — Z8249 Family history of ischemic heart disease and other diseases of the circulatory system: Secondary | ICD-10-CM | POA: Diagnosis not present

## 2017-08-17 LAB — BASIC METABOLIC PANEL
Anion gap: 12 (ref 5–15)
BUN: 15 mg/dL (ref 6–20)
CHLORIDE: 104 mmol/L (ref 101–111)
CO2: 28 mmol/L (ref 22–32)
CREATININE: 1.09 mg/dL — AB (ref 0.44–1.00)
Calcium: 8.7 mg/dL — ABNORMAL LOW (ref 8.9–10.3)
GFR calc non Af Amer: >60 mL/min (ref >60)
Glucose, Bld: 106 mg/dL — ABNORMAL HIGH (ref 65–99)
POTASSIUM: 3.2 mmol/L — AB (ref 3.5–5.1)
Sodium: 144 mmol/L (ref 135–145)

## 2017-08-17 MED ORDER — SACUBITRIL-VALSARTAN 97-103 MG PO TABS: 1.0000 | ORAL_TABLET | Freq: Two times a day (BID) | ORAL | 11 refills | Status: DC

## 2017-08-17 MED ORDER — POTASSIUM CHLORIDE CRYS ER 20 MEQ PO TBCR: 20.0000 meq | EXTENDED_RELEASE_TABLET | Freq: Every day | ORAL | 3 refills | Status: DC

## 2017-08-17 MED ORDER — TORSEMIDE 20 MG PO TABS: 40.0000 mg | ORAL_TABLET | Freq: Every day | ORAL | 11 refills | Status: DC

## 2017-08-17 MED FILL — TORSEMIDE 20 MG TABLET: 20 | 30 days supply | Qty: 60 | Fill #0

## 2017-08-17 NOTE — Telephone Encounter (Signed)
Patient aware. Patient voiced understanding  

## 2017-08-17 NOTE — Telephone Encounter (Signed)
-----   Message from [name], PA-C sent at 08/17/2017 10:06 AM EST ----- Please see below.

## 2017-08-17 NOTE — Progress Notes (Signed)
Patient ID: [name], female   DOB: 1975-09-30, 42 y.o.   MRN: [id number]    Advanced Heart Failure Clinic Note   PCP: Dr. Heath Gold  OB:  Dr. Leo Grosser  EP: Dr. Lovena Le CHF: Bensimhon  HPI: [name] is a 42 y.o. female with h/o obesity, chronic systolic heart failure due to nonischemic cardiomyopathy (cath 2007. No CAD) s/p Medtronic ICD implantation, asthma/chronic bronchitis and severe hypertension.   Seen in ED 05/04/16 was having atypical chest pain and jaw pain. Thought to be mildly volume overloaded.   Admitted 11/24/16-11/26/16 for Laparoscopic Sleeve Gastrectomy with hiatal hernia repair, upper endoscopy. Discharge weight was 251 pounds.   Admitted 12/22 - 12/24 with hypotension and viral URI. + Flu A. Improved with IV fluids. No chronic med changes  She presents today for post hospital follow up for Flu as above. Feeling great overall. Denies SOB/PND/Orthopnea.  Taking all medications, but occasionally she will miss a torsemide. She has not needed any extra torsemide. Using CPAP most nights. Continues to work full time.   Today she returns for HF follow up. Complaining of nasal congestion. Started on levaquin on 12/19. Has been off carvedilol and amlodipine for over a month. Denies SOB/PND/Orthopnea. Taking all medications. Using CPAP most nights but not lately with URI. Working full time.   Optivol - Fluid trending up but not above threshold. Activity 3-4 hours daily. No Vt/AF.   ECHOs 04/27/2011 45-50%.   08/20/11 ECHO 55-60%   05/19/12 EF ~40%   07/01/12 EF ~40%   08/02/12 EF~40%    2/14 EF ~50%                          5/14 EF ~35%                          1/16 EF 25-30%                           3/17 EF 55-60% Grade I DD   01/2017 EF 60-65%                           CPX 01/2014: Resting HR 75, Peak HR 121 Peak VO2 15.2, 74.4% predicted VE/VCO2 slope: 26.4 OUES: 1.93 Peak RER 1.15 VE/MVV 61.7%  SH: Married. Lives in [place], 2 children. Working at [employer]. No  ETOH or smoking  FH: Mother living: DM2, HTN, no CAD        Father living: DM2, HTN  Review of systems complete and found to be negative unless listed in HPI.    Past Medical History:  Diagnosis Date  . Acute bronchitis and bronchiolitis   . Anemia    Iron supplements in the past  . Asthma    Inhaler prn;triggered by seasonal changes mostly cold weathert;last asthma  attack years ago  . Atrial fibrillation ([place])   . CHF (congestive heart failure) ([place])   . Chronic systolic CHF (congestive heart failure), NYHA class 2 ([place]) 01/2006   Has MDT Defibrillator, EF had improved by 2017 echo  . Hypertension   . Hypertension   . Infection    UTI;not frequent;last infection was in June  . Infection    Yeast;not frequent  . Migraines    Can't take Procardia XL, causes migraines  . Other primary cardiomyopathies   . Paroxysmal ventricular tachycardia (  [place])   . Varicose veins 1998   Developed during last pregnancy    Current Outpatient Medications  Medication Sig Dispense Refill  . albuterol (PROVENTIL HFA;VENTOLIN HFA) 108 (90 Base) MCG/ACT inhaler Inhale 2 puffs into the lungs every 6 (six) hours as needed for wheezing or shortness of breath. Rescue inhaler 1 Inhaler 1  . amLODipine (NORVASC) 2.5 MG tablet Take 1 tablet (2.5 mg total) by mouth daily. 30 tablet 6  . calcium carbonate (OSCAL) 1500 (600 Ca) MG TABS tablet Take 600 mg of elemental calcium by mouth 3 (three) times daily with meals.    . cetirizine (ZYRTEC) 10 MG tablet Take 10 mg by mouth daily as needed for allergies.     Marland Kitchen guaiFENesin-dextromethorphan (ROBITUSSIN DM) 100-10 MG/5ML syrup Take 5 mLs by mouth every 6 (six) hours as needed for cough.    . Multiple Vitamin (MULTIVITAMIN WITH MINERALS) TABS tablet Take 1 tablet by mouth daily.    Marland Kitchen oseltamivir (TAMIFLU) 75 MG capsule Take 1 capsule (75 mg total) by mouth 2 (two) times daily. 7 capsule 0  . sacubitril-valsartan (ENTRESTO) 49-51 MG Take 1 tablet by mouth 2 (two)  times daily. 60 tablet 11  . torsemide (DEMADEX) 20 MG tablet Take 2 tablets (40 mg total) by mouth daily. 60 tablet 3   No current facility-administered medications for this encounter.     Vitals:   08/17/17 0836  BP: (!) 151/84  Pulse: 70  SpO2: 100%  Weight: 196 lb (88.9 kg)   Wt Readings from Last 3 Encounters:  08/17/17 196 lb (88.9 kg)  07/19/17 195 lb 6.4 oz (88.6 kg)  07/15/17 196 lb 12.8 oz (89.3 kg)     PHYSICAL EXAM: General: Well appearing. No resp difficulty. HEENT: Normal Neck: Supple. JVP 7-8 cm. Carotids 2+ bilat; no bruits. No thyromegaly or nodule noted. Cor: PMI nondisplaced. RRR, No M/G/R noted Lungs: CTAB, normal effort. Abdomen: Soft, non-tender, non-distended, no HSM. No bruits or masses. +BS  Extremities: No cyanosis, clubbing, or rash. Trace ankle edema.  Neuro: Alert & orientedx3, cranial nerves grossly intact. moves all 4 extremities w/o difficulty. Affect pleasant   ASSESSMENT & PLAN:  1) Chronic combined systolic/diastolic heart failure with recovered EF, NICM s/p ICD likely r/t HTN, last echo 1/16 EF 25-30%. Cath 8/16 EF No CAD. ECHO in July of this year. EF 55-60%/.   - Optivol - Fluid trending up but not above threshold. Activity 3-4 hours daily. No Vt/AF.  - Off BB.  - Volume status stable. - Continue torsemide 40 mg daily. Can take extra 20 mg as needed. - Increase Entresto 97/103 mg BID BMET today and 10 days.  - Off hydralazine/imdur/bb with EF recovery.   - Reinforced fluid restriction to < 2 L daily, sodium restriction to less than 2000 mg daily, and the importance of daily weights.   2) OSA  - Continue nightly CPAP.  3) HTN - Elevated meds as above.  - Stop amlodipine with better HF data in Entresto.   4) Asthma/chronic bronchitis - Stable. 5) Obesity: - S/p laparoscopic sleeve gastrectomy with hiatal hernia repair, upper endoscopy. Weight stable.   Meds and labs as above. RTC 2 months.   [name], PA-C  8:40  AM   Greater than 50% of the 25 minute visit was spent in counseling/coordination of care regarding disease state education, salt/fluid restriction, sliding scale diuretics, and medication compliance.

## 2017-08-17 NOTE — Patient Instructions (Addendum)
STOP Amlodipine (Norvasc).  INCREASE Entresto to 97/103 mg twice daily. Can "double up" on current 49/51 mg tablets you have at home (Take 2 tabs twice daily). New Rx has beens ent to your pharmacy for 97/103 mg tablets (Take 1 tab twice daily).  Refill sent for torsemide.  Routine lab work today. Will notify you of abnormal results, otherwise no news is good news!  Repeat labs 10 days.  _______________________________________________________________________ [name] Code: 9001  Follow up as scheduled on March 20th at 3:30 pm. Garage Code: 9002  Take all medication as prescribed the day of your appointment. Bring all medications with you to your appointment.  Do the following things EVERYDAY: 1) Weigh yourself in the morning before breakfast. Write it down and keep it in a log. 2) Take your medicines as prescribed 3) Eat low salt foods-Limit salt (sodium) to 2000 mg per day.  4) Stay as active as you can everyday 5) Limit all fluids for the day to less than 2 liters

## 2017-08-23 ENCOUNTER — Ambulatory Visit (INDEPENDENT_AMBULATORY_CARE_PROVIDER_SITE_OTHER): Payer: Managed Care, Other (non HMO) | Admitting: *Deleted

## 2017-08-23 DIAGNOSIS — I472 Ventricular tachycardia: Secondary | ICD-10-CM

## 2017-08-23 DIAGNOSIS — I4729 Other ventricular tachycardia: Secondary | ICD-10-CM

## 2017-08-23 NOTE — Progress Notes (Signed)
Remote ICD transmission.   

## 2017-08-25 LAB — CUP PACEART REMOTE DEVICE CHECK
Battery Remaining Longevity: 118 mo
Brady Statistic RV Percent Paced: 0.01 %
HIGH POWER IMPEDANCE MEASURED VALUE: 45 Ohm
HighPow Impedance: 56 Ohm
Lead Channel Impedance Value: 304 Ohm
Lead Channel Pacing Threshold Amplitude: 2 V
Lead Channel Sensing Intrinsic Amplitude: 24.375 mV
Lead Channel Setting Pacing Amplitude: 3.5 V
Lead Channel Setting Pacing Pulse Width: 0.7 ms
Lead Channel Setting Sensing Sensitivity: 0.3 mV
MDC IDC LEAD IMPLANT DT: 20070718
MDC IDC LEAD LOCATION: 753860
MDC IDC MSMT BATTERY VOLTAGE: 3.02 V
MDC IDC MSMT LEADCHNL RV IMPEDANCE VALUE: 361 Ohm
MDC IDC MSMT LEADCHNL RV PACING THRESHOLD PULSEWIDTH: 0.4 ms
MDC IDC MSMT LEADCHNL RV SENSING INTR AMPL: 24.375 mV
MDC IDC PG IMPLANT DT: 20160215
MDC IDC SESS DTM: 20190128132725

## 2017-08-26 ENCOUNTER — Encounter: Payer: Self-pay | Admitting: Cardiology

## 2017-08-30 ENCOUNTER — Ambulatory Visit (HOSPITAL_COMMUNITY)
Admission: RE | Admit: 2017-08-30 | Discharge: 2017-08-30 | Disposition: A | Discharge status: Home / Self Care | Payer: Managed Care, Other (non HMO) | Source: Ambulatory Visit | Attending: Cardiology | Admitting: Cardiology

## 2017-08-30 DIAGNOSIS — I5032 Chronic diastolic (congestive) heart failure: Secondary | ICD-10-CM | POA: Insufficient documentation

## 2017-08-30 LAB — BASIC METABOLIC PANEL
Anion gap: 13 (ref 5–15)
BUN: 13 mg/dL (ref 6–20)
CHLORIDE: 102 mmol/L (ref 101–111)
CO2: 27 mmol/L (ref 22–32)
CREATININE: 1.09 mg/dL — AB (ref 0.44–1.00)
Calcium: 8.3 mg/dL — ABNORMAL LOW (ref 8.9–10.3)
GFR calc Af Amer: >60 mL/min (ref >60)
GFR calc non Af Amer: >60 mL/min (ref >60)
Glucose, Bld: 126 mg/dL — ABNORMAL HIGH (ref 65–99)
Potassium: 3.2 mmol/L — ABNORMAL LOW (ref 3.5–5.1)
Sodium: 142 mmol/L (ref 135–145)

## 2017-08-31 ENCOUNTER — Telehealth (HOSPITAL_COMMUNITY): Payer: Self-pay | Admitting: Cardiology

## 2017-08-31 MED ORDER — POTASSIUM CHLORIDE CRYS ER 20 MEQ PO TBCR: 40.0000 meq | EXTENDED_RELEASE_TABLET | Freq: Two times a day (BID) | ORAL | 3 refills | Status: DC

## 2017-08-31 NOTE — Telephone Encounter (Signed)
-----   Message from [name], PA-C sent at 08/30/2017  3:35 PM EST ----- Take K 40 meq x 2 doses today, (80 total) then increase her daily K to 40 meq daily.     Legrand Como [address] [place], PA-C 08/30/2017 3:35 PM

## 2017-08-31 NOTE — Telephone Encounter (Signed)
Patient aware. Patient voiced understanding  

## 2017-09-03 ENCOUNTER — Other Ambulatory Visit: Payer: Self-pay | Admitting: Internal Medicine

## 2017-09-07 ENCOUNTER — Telehealth (HOSPITAL_COMMUNITY): Payer: Self-pay | Admitting: *Deleted

## 2017-09-07 MED ORDER — SACUBITRIL-VALSARTAN 97-103 MG PO TABS: 1.0000 | ORAL_TABLET | Freq: Two times a day (BID) | ORAL | 11 refills | Status: DC

## 2017-09-07 MED FILL — ENTRESTO 97 MG-103 MG TAB: 97-103 | 30 days supply | Qty: 60 | Fill #0

## 2017-09-07 NOTE — Telephone Encounter (Signed)
Patient called back I spoke with her she said that the pharmacy was charging her $500 for entresto even after I told her the PA went through. Patient requests a call from [place]. I told her that Doroteo Bradford was with patients but would call her as soon as she was available.   Message routed to [facility]

## 2017-09-07 NOTE — Telephone Encounter (Signed)
Spoke with Outpatient Pharmacy who verified $10 copay with copay card and insurance. Relayed to Ms. Manahan who was grateful for the assistance.   Ruta Hinds. [name], PharmD, BCPS, CPP Clinical Pharmacist Phone: [phone] 09/07/2017 4:35 PM

## 2017-09-07 NOTE — Telephone Encounter (Deleted)
Patient called back I spoke with her she said that the pharmacy was charging her $500 for entresto even after I told her the PA went through. Patient requests a call from [place]. I told her that Doroteo Bradford was with patients but would call her as soon as she was available.   Message routed to [facility]

## 2017-09-07 NOTE — Telephone Encounter (Signed)
Patient called stating she needed a PA for [facility]. She said she was told at her office visit on 1/22 that someone would be working on it but she is still unable to receive the script from her pharmacy.   Message routed to [facility]

## 2017-09-07 NOTE — Telephone Encounter (Signed)
Entresto PA approved by [employer] through 09/07/18.   Ruta Hinds. [name], PharmD, BCPS, CPP Clinical Pharmacist Phone: [phone] 09/07/2017 2:01 PM

## 2017-10-06 MED FILL — POTASSIUM CL ER 20 MEQ TABL: 20 | 30 days supply | Qty: 120 | Fill #0

## 2017-10-06 MED FILL — ENTRESTO 97 MG-103 MG TAB: 97-103 | 30 days supply | Qty: 60 | Fill #1

## 2017-10-06 MED FILL — TORSEMIDE 20 MG TABLET: 20 | 30 days supply | Qty: 60 | Fill #1

## 2017-10-13 ENCOUNTER — Ambulatory Visit (HOSPITAL_COMMUNITY)
Admission: RE | Admit: 2017-10-13 | Discharge: 2017-10-13 | Disposition: A | Discharge status: Home / Self Care | Payer: Managed Care, Other (non HMO) | Source: Ambulatory Visit | Attending: Cardiology | Admitting: Cardiology

## 2017-10-13 VITALS — BP 176/112 | HR 84 | Wt 196.8 lb

## 2017-10-13 DIAGNOSIS — I509 Heart failure, unspecified: Secondary | ICD-10-CM

## 2017-10-13 DIAGNOSIS — J45909 Unspecified asthma, uncomplicated: Secondary | ICD-10-CM | POA: Insufficient documentation

## 2017-10-13 DIAGNOSIS — Z8249 Family history of ischemic heart disease and other diseases of the circulatory system: Secondary | ICD-10-CM | POA: Diagnosis not present

## 2017-10-13 DIAGNOSIS — E669 Obesity, unspecified: Secondary | ICD-10-CM | POA: Insufficient documentation

## 2017-10-13 DIAGNOSIS — I10 Essential (primary) hypertension: Secondary | ICD-10-CM | POA: Diagnosis not present

## 2017-10-13 DIAGNOSIS — Z95810 Presence of automatic (implantable) cardiac defibrillator: Secondary | ICD-10-CM | POA: Diagnosis not present

## 2017-10-13 DIAGNOSIS — Z833 Family history of diabetes mellitus: Secondary | ICD-10-CM | POA: Insufficient documentation

## 2017-10-13 DIAGNOSIS — I429 Cardiomyopathy, unspecified: Secondary | ICD-10-CM | POA: Insufficient documentation

## 2017-10-13 DIAGNOSIS — G4733 Obstructive sleep apnea (adult) (pediatric): Secondary | ICD-10-CM | POA: Insufficient documentation

## 2017-10-13 DIAGNOSIS — I5042 Chronic combined systolic (congestive) and diastolic (congestive) heart failure: Secondary | ICD-10-CM | POA: Diagnosis not present

## 2017-10-13 DIAGNOSIS — I110 Hypertensive heart disease with heart failure: Secondary | ICD-10-CM | POA: Diagnosis present

## 2017-10-13 DIAGNOSIS — Z79899 Other long term (current) drug therapy: Secondary | ICD-10-CM | POA: Diagnosis not present

## 2017-10-13 DIAGNOSIS — J42 Unspecified chronic bronchitis: Secondary | ICD-10-CM

## 2017-10-13 LAB — BASIC METABOLIC PANEL
ANION GAP: 9 (ref 5–15)
BUN: 14 mg/dL (ref 6–20)
CO2: 27 mmol/L (ref 22–32)
CREATININE: 1.02 mg/dL — AB (ref 0.44–1.00)
Calcium: 8.3 mg/dL — ABNORMAL LOW (ref 8.9–10.3)
Chloride: 104 mmol/L (ref 101–111)
GFR calc non Af Amer: >60 mL/min (ref >60)
Glucose, Bld: 94 mg/dL (ref 65–99)
Potassium: 3.4 mmol/L — ABNORMAL LOW (ref 3.5–5.1)
SODIUM: 140 mmol/L (ref 135–145)

## 2017-10-13 MED ORDER — SPIRONOLACTONE 25 MG PO TABS: 12.5000 mg | ORAL_TABLET | Freq: Every day | ORAL | 3 refills | Status: DC

## 2017-10-13 MED FILL — SPIRONOLACTONE 25 MG TABLET: 25 | 30 days supply | Qty: 15 | Fill #0

## 2017-10-13 NOTE — Patient Instructions (Addendum)
Continue Potassium 20 meq (1 tab) twice daily.  Take Entresto 1 tab twice daily.  START Spironolactone 12.5 mg (1/2 tablet) once daily.  Routine lab work today. Will notify you of abnormal results, otherwise no news is good news!  Return in 2 weeks for repeat labs.  ___________________________________________________________ [name] Code:  1100  Follow up 6 weeks with Briana Kilts PA-C.  ____________________________________________________________ [name] Code: 2130  Take all medication as prescribed the day of your appointment. Bring all medications with you to your appointment.  Do the following things EVERYDAY: 1) Weigh yourself in the morning before breakfast. Write it down and keep it in a log. 2) Take your medicines as prescribed 3) Eat low salt foods-Limit salt (sodium) to 2000 mg per day.  4) Stay as active as you can everyday 5) Limit all fluids for the day to less than 2 liters

## 2017-10-13 NOTE — Progress Notes (Signed)
Patient ID: [name], female   DOB: January 13, 1976, 42 y.o.   MRN: [id number]    Advanced Heart Failure Clinic Note   PCP: Dr. Heath Gold  OB:  Dr. Leo Grosser  EP: Dr. Lovena Le CHF: Bensimhon  HPI: [name] is a 42 y.o. female with h/o obesity, chronic systolic heart failure due to nonischemic cardiomyopathy (cath 2007. No CAD) s/p Medtronic ICD implantation, asthma/chronic bronchitis and severe hypertension.   Seen in ED 05/04/16 was having atypical chest pain and jaw pain. Thought to be mildly volume overloaded.   Admitted 11/24/16-11/26/16 for Laparoscopic Sleeve Gastrectomy with hiatal hernia repair, upper endoscopy. Discharge weight was 251 pounds.   Admitted 12/22 - 12/24 with hypotension and viral URI. + Flu A. Improved with IV fluids. No chronic med changes  She presents today for regular follow up. At last visit Entresto increased. BP elevated on exam. Pt is very candid, and states she recently started a job and has only been taking her Entresto ~7 times a WEEK. Usually, once a day. Taking torsemide as directed. She denies SOB/PND or orthopnea. Using CPAP in the evenings. Denies dizziness or lightheadedness.   Optivol - Thoracic impedence below threshold but trending towards normal. Pt activity increasing to upwards of 4 hours daily. No VT/VF.  ECHOs 04/27/2011 45-50%.   08/20/11 ECHO 55-60%   05/19/12 EF ~40%   07/01/12 EF ~40%   08/02/12 EF~40%    2/14 EF ~50%                          5/14 EF ~35%                          1/16 EF 25-30%                           3/17 EF 55-60% Grade I DD   01/2017 EF 60-65%                           CPX 01/2014: Resting HR 75, Peak HR 121 Peak VO2 15.2, 74.4% predicted VE/VCO2 slope: 26.4 OUES: 1.93 Peak RER 1.15 VE/MVV 61.7%  SH: Married. Lives in [place], 2 children. Working at [employer]. No ETOH or smoking  FH: Mother living: DM2, HTN, no CAD        Father living: DM2, HTN  Review of systems complete and found to be negative unless  listed in HPI.    Past Medical History:  Diagnosis Date  . Acute bronchitis and bronchiolitis   . Anemia    Iron supplements in the past  . Asthma    Inhaler prn;triggered by seasonal changes mostly cold weathert;last asthma  attack years ago  . Atrial fibrillation ([place])   . CHF (congestive heart failure) ([place])   . Chronic systolic CHF (congestive heart failure), NYHA class 2 ([place]) 01/2006   Has MDT Defibrillator, EF had improved by 2017 echo  . Hypertension   . Hypertension   . Infection    UTI;not frequent;last infection was in June  . Infection    Yeast;not frequent  . Migraines    Can't take Procardia XL, causes migraines  . Other primary cardiomyopathies   . Paroxysmal ventricular tachycardia ([place])   . Varicose veins 1998   Developed during last pregnancy   Current Outpatient Medications  Medication Sig Dispense Refill  .  potassium chloride SA (K-DUR,KLOR-CON) 20 MEQ tablet Take 20 mEq by mouth 2 (two) times daily.    . sacubitril-valsartan (ENTRESTO) 97-103 MG Take 1 tablet by mouth 2 (two) times daily. 60 tablet 11  . albuterol (PROVENTIL HFA;VENTOLIN HFA) 108 (90 Base) MCG/ACT inhaler Inhale 2 puffs into the lungs every 6 (six) hours as needed for wheezing or shortness of breath. Rescue inhaler 1 Inhaler 1  . calcium carbonate (OSCAL) 1500 (600 Ca) MG TABS tablet Take 600 mg of elemental calcium by mouth 3 (three) times daily with meals.    . cetirizine (ZYRTEC) 10 MG tablet Take 10 mg by mouth daily as needed for allergies.     Marland Kitchen guaiFENesin-dextromethorphan (ROBITUSSIN DM) 100-10 MG/5ML syrup Take 5 mLs by mouth every 6 (six) hours as needed for cough.    . Multiple Vitamin (MULTIVITAMIN WITH MINERALS) TABS tablet Take 1 tablet by mouth daily.    Marland Kitchen oseltamivir (TAMIFLU) 75 MG capsule Take 1 capsule (75 mg total) by mouth 2 (two) times daily. 7 capsule 0  . torsemide (DEMADEX) 20 MG tablet Take 2 tablets (40 mg total) by mouth daily. 60 tablet 11   No current  facility-administered medications for this encounter.    Vitals:   10/13/17 1545  BP: (!) 176/112  Pulse: 84  SpO2: 97%  Weight: 196 lb 12.8 oz (89.3 kg)     Wt Readings from Last 3 Encounters:  10/13/17 196 lb 12.8 oz (89.3 kg)  08/17/17 196 lb (88.9 kg)  07/19/17 195 lb 6.4 oz (88.6 kg)   PHYSICAL EXAM: General: Well appearing. No resp difficulty. HEENT: Normal Neck: Supple. JVP 5-6. Carotids 2+ bilat; no bruits. No thyromegaly or nodule noted. Cor: PMI nondisplaced. RRR, No M/G/R noted Lungs: CTAB, normal effort. Abdomen: Soft, non-tender, non-distended, no HSM. No bruits or masses. +BS  Extremities: No cyanosis, clubbing, or rash. R and LLE no edema.  Neuro: Alert & orientedx3, cranial nerves grossly intact. moves all 4 extremities w/o difficulty. Affect pleasant   ASSESSMENT & PLAN:  1) Chronic combined systolic/diastolic heart failure with recovered EF, NICM s/p ICD likely r/t HTN, last echo 1/16 EF 25-30%. Cath 8/16 EF No CAD. ECHO 01/2017 EF 55-60% - Optivol - Fluid stable.  - Volume status stable on exam and optivol.  - Continue torsemide 40 mg daily. Can take extra 20 mg as needed. - Continue Entresto 97/103 mg BID. Encouraged compliance.  - Start spironolactone 12.5 mg daily. BMET today and 10 days.  - Add coreg back at next visit.  - Reinforced fluid restriction to < 2 L daily, sodium restriction to less than 2000 mg daily, and the importance of daily weights.   2) OSA  - Continue nightly CPAP. 3) HTN - Meds as above. 4) Asthma/chronic bronchitis - Stable. 5) Obesity: - S/p laparoscopic sleeve gastrectomy with hiatal hernia repair, upper endoscopy.  - Weight stable.  Doing well. Stressed importance of med compliance. Stressed importance of compliance.   [name], PA-C  3:47 PM   Greater than 50% of the 25 minute visit was spent in counseling/coordination of care regarding disease state education, salt/fluid restriction, sliding scale  diuretics, and medication compliance.

## 2017-10-26 ENCOUNTER — Ambulatory Visit (HOSPITAL_COMMUNITY)
Admission: RE | Admit: 2017-10-26 | Discharge: 2017-10-26 | Disposition: A | Discharge status: Home / Self Care | Payer: Managed Care, Other (non HMO) | Source: Ambulatory Visit | Attending: Internal Medicine | Admitting: Internal Medicine

## 2017-10-26 DIAGNOSIS — I509 Heart failure, unspecified: Secondary | ICD-10-CM | POA: Diagnosis present

## 2017-10-26 LAB — BASIC METABOLIC PANEL WITH GFR
Anion gap: 7 (ref 5–15)
BUN: 13 mg/dL (ref 6–20)
CO2: 31 mmol/L (ref 22–32)
Calcium: 8.5 mg/dL — ABNORMAL LOW (ref 8.9–10.3)
Chloride: 104 mmol/L (ref 101–111)
Creatinine, Ser: 0.98 mg/dL (ref 0.44–1.00)
GFR calc Af Amer: >60 mL/min
GFR calc non Af Amer: >60 mL/min
Glucose, Bld: 102 mg/dL — ABNORMAL HIGH (ref 65–99)
Potassium: 3.2 mmol/L — ABNORMAL LOW (ref 3.5–5.1)
Sodium: 142 mmol/L (ref 135–145)

## 2017-11-03 ENCOUNTER — Other Ambulatory Visit (HOSPITAL_COMMUNITY): Payer: Self-pay | Admitting: *Deleted

## 2017-11-03 ENCOUNTER — Other Ambulatory Visit (HOSPITAL_COMMUNITY): Payer: Managed Care, Other (non HMO)

## 2017-11-03 DIAGNOSIS — I5032 Chronic diastolic (congestive) heart failure: Secondary | ICD-10-CM

## 2017-11-19 ENCOUNTER — Other Ambulatory Visit: Payer: Self-pay | Admitting: Internal Medicine

## 2017-11-22 ENCOUNTER — Other Ambulatory Visit (HOSPITAL_COMMUNITY): Payer: Self-pay | Admitting: Pharmacist

## 2017-11-22 ENCOUNTER — Ambulatory Visit (INDEPENDENT_AMBULATORY_CARE_PROVIDER_SITE_OTHER): Payer: Managed Care, Other (non HMO) | Admitting: *Deleted

## 2017-11-22 DIAGNOSIS — I472 Ventricular tachycardia: Secondary | ICD-10-CM | POA: Diagnosis not present

## 2017-11-22 DIAGNOSIS — I4729 Other ventricular tachycardia: Secondary | ICD-10-CM

## 2017-11-22 MED ORDER — SPIRONOLACTONE 25 MG PO TABS: 25.0000 mg | ORAL_TABLET | Freq: Every day | ORAL | 3 refills | Status: DC

## 2017-11-22 NOTE — Progress Notes (Signed)
Remote ICD transmission.   

## 2017-11-23 ENCOUNTER — Encounter: Payer: Self-pay | Admitting: Cardiology

## 2017-11-23 NOTE — Progress Notes (Signed)
Letter  

## 2017-11-24 LAB — CUP PACEART REMOTE DEVICE CHECK
HIGH POWER IMPEDANCE MEASURED VALUE: 40 Ohm
HIGH POWER IMPEDANCE MEASURED VALUE: 49 Ohm
Implantable Lead Implant Date: 20070718
Implantable Lead Location: 753860
Lead Channel Pacing Threshold Amplitude: 2.25 V
Lead Channel Pacing Threshold Pulse Width: 0.4 ms
Lead Channel Sensing Intrinsic Amplitude: 20 mV
Lead Channel Sensing Intrinsic Amplitude: 20 mV
Lead Channel Setting Pacing Pulse Width: 0.7 ms
MDC IDC MSMT BATTERY REMAINING LONGEVITY: 115 mo
MDC IDC MSMT BATTERY VOLTAGE: 3.01 V
MDC IDC MSMT LEADCHNL RV IMPEDANCE VALUE: 285 Ohm
MDC IDC MSMT LEADCHNL RV IMPEDANCE VALUE: 342 Ohm
MDC IDC PG IMPLANT DT: 20160215
MDC IDC SESS DTM: 20190429062825
MDC IDC SET LEADCHNL RV PACING AMPLITUDE: 3.5 V
MDC IDC SET LEADCHNL RV SENSING SENSITIVITY: 0.3 mV
MDC IDC STAT BRADY RV PERCENT PACED: 0.01 %

## 2017-11-26 ENCOUNTER — Encounter (HOSPITAL_COMMUNITY): Payer: Self-pay

## 2017-11-26 ENCOUNTER — Ambulatory Visit (HOSPITAL_COMMUNITY)
Admission: RE | Admit: 2017-11-26 | Discharge: 2017-11-26 | Disposition: A | Discharge status: Home / Self Care | Payer: Managed Care, Other (non HMO) | Source: Ambulatory Visit | Attending: Cardiology | Admitting: Cardiology

## 2017-11-26 VITALS — BP 152/98 | HR 76 | Wt 199.2 lb

## 2017-11-26 DIAGNOSIS — E669 Obesity, unspecified: Secondary | ICD-10-CM | POA: Diagnosis not present

## 2017-11-26 DIAGNOSIS — Z95810 Presence of automatic (implantable) cardiac defibrillator: Secondary | ICD-10-CM | POA: Insufficient documentation

## 2017-11-26 DIAGNOSIS — I4891 Unspecified atrial fibrillation: Secondary | ICD-10-CM | POA: Diagnosis not present

## 2017-11-26 DIAGNOSIS — Z8249 Family history of ischemic heart disease and other diseases of the circulatory system: Secondary | ICD-10-CM | POA: Diagnosis not present

## 2017-11-26 DIAGNOSIS — I429 Cardiomyopathy, unspecified: Secondary | ICD-10-CM | POA: Diagnosis not present

## 2017-11-26 DIAGNOSIS — G4733 Obstructive sleep apnea (adult) (pediatric): Secondary | ICD-10-CM | POA: Diagnosis not present

## 2017-11-26 DIAGNOSIS — J45909 Unspecified asthma, uncomplicated: Secondary | ICD-10-CM | POA: Insufficient documentation

## 2017-11-26 DIAGNOSIS — I5022 Chronic systolic (congestive) heart failure: Secondary | ICD-10-CM

## 2017-11-26 DIAGNOSIS — I5042 Chronic combined systolic (congestive) and diastolic (congestive) heart failure: Secondary | ICD-10-CM | POA: Diagnosis not present

## 2017-11-26 DIAGNOSIS — Z833 Family history of diabetes mellitus: Secondary | ICD-10-CM | POA: Diagnosis not present

## 2017-11-26 DIAGNOSIS — Z9884 Bariatric surgery status: Secondary | ICD-10-CM | POA: Insufficient documentation

## 2017-11-26 DIAGNOSIS — Z79899 Other long term (current) drug therapy: Secondary | ICD-10-CM | POA: Diagnosis not present

## 2017-11-26 DIAGNOSIS — I110 Hypertensive heart disease with heart failure: Secondary | ICD-10-CM | POA: Insufficient documentation

## 2017-11-26 LAB — BASIC METABOLIC PANEL
Anion gap: 9 (ref 5–15)
BUN: 14 mg/dL (ref 6–20)
CHLORIDE: 101 mmol/L (ref 101–111)
CO2: 33 mmol/L — ABNORMAL HIGH (ref 22–32)
Calcium: 8.7 mg/dL — ABNORMAL LOW (ref 8.9–10.3)
Creatinine, Ser: 1.04 mg/dL — ABNORMAL HIGH (ref 0.44–1.00)
GFR calc non Af Amer: >60 mL/min (ref >60)
Glucose, Bld: 102 mg/dL — ABNORMAL HIGH (ref 65–99)
POTASSIUM: 3.5 mmol/L (ref 3.5–5.1)
SODIUM: 143 mmol/L (ref 135–145)

## 2017-11-26 MED ORDER — TORSEMIDE 20 MG PO TABS: 40.0000 mg | ORAL_TABLET | Freq: Every day | ORAL | 3 refills | Status: DC

## 2017-11-26 MED ORDER — SPIRONOLACTONE 25 MG PO TABS: 25.0000 mg | ORAL_TABLET | Freq: Every day | ORAL | 3 refills | Status: DC

## 2017-11-26 MED ORDER — SACUBITRIL-VALSARTAN 97-103 MG PO TABS: 1.0000 | ORAL_TABLET | Freq: Two times a day (BID) | ORAL | 3 refills | Status: DC

## 2017-11-26 MED ORDER — POTASSIUM CHLORIDE CRYS ER 20 MEQ PO TBCR: 20.0000 meq | EXTENDED_RELEASE_TABLET | Freq: Two times a day (BID) | ORAL | 3 refills | Status: DC

## 2017-11-26 NOTE — Progress Notes (Signed)
Patient ID: [name], female   DOB: 06/16/1976, 42 y.o.   MRN: [id number]    Advanced Heart Failure Clinic Note   PCP: Dr. Heath Gold  OB:  Dr. Leo Grosser  EP: Dr. Lovena Le CHF: Bensimhon  HPI: [name] is a 42 y.o. female with h/o obesity, chronic systolic heart failure due to nonischemic cardiomyopathy (cath 2007. No CAD) s/p Medtronic ICD implantation, asthma/chronic bronchitis and severe hypertension.   Seen in ED 05/04/16 was having atypical chest pain and jaw pain. Thought to be mildly volume overloaded.   Admitted 11/24/16-11/26/16 for Laparoscopic Sleeve Gastrectomy with hiatal hernia repair, upper endoscopy. Discharge weight was 251 pounds.   Admitted 12/22 - 12/24 with hypotension and viral URI. + Flu A. Improved with IV fluids. No chronic med changes  She presents today for regular follow up.  Feeling great overall. BP mildly up. She is very straightforward about her medications. She has been having a lot of stress at her new job, and missed one dose of Entresto 3-4 times a week.  She denies SOB or PND. She is exercising most days, walking or [facility] the [facility]. No SOB, PND, or orthopnea. Using CPAP nightly. Denies lightheadedness or dizziness.   Optivol - Thoracic impedence normalized with several ups and downs. Fluid index mildly up but no crossing. Pt activity 3-4 hours daily. No VT/VF.   ECHOs 04/27/2011 45-50%.   08/20/11 ECHO 55-60%   05/19/12 EF ~40%   07/01/12 EF ~40%   08/02/12 EF~40%    2/14 EF ~50%                          5/14 EF ~35%                          1/16 EF 25-30%                           3/17 EF 55-60% Grade I DD   01/2017 EF 60-65%                           CPX 01/2014: Resting HR 75, Peak HR 121 Peak VO2 15.2, 74.4% predicted VE/VCO2 slope: 26.4 OUES: 1.93 Peak RER 1.15 VE/MVV 61.7%  SH: Married. Lives in [place], 2 children. Working at [employer]. No ETOH or smoking  FH: Mother living: DM2, HTN, no CAD        Father living: DM2,  HTN  Review of systems complete and found to be negative unless listed in HPI.    Past Medical History:  Diagnosis Date  . Acute bronchitis and bronchiolitis   . Anemia    Iron supplements in the past  . Asthma    Inhaler prn;triggered by seasonal changes mostly cold weathert;last asthma  attack years ago  . Atrial fibrillation ([place])   . CHF (congestive heart failure) ([place])   . Chronic systolic CHF (congestive heart failure), NYHA class 2 ([place]) 01/2006   Has MDT Defibrillator, EF had improved by 2017 echo  . Hypertension   . Hypertension   . Infection    UTI;not frequent;last infection was in June  . Infection    Yeast;not frequent  . Migraines    Can't take Procardia XL, causes migraines  . Other primary cardiomyopathies   . Paroxysmal ventricular tachycardia ([place])   . Varicose veins 1998  Developed during last pregnancy   Current Outpatient Medications  Medication Sig Dispense Refill  . albuterol (PROVENTIL HFA;VENTOLIN HFA) 108 (90 Base) MCG/ACT inhaler Inhale 2 puffs into the lungs every 6 (six) hours as needed for wheezing or shortness of breath. Rescue inhaler 1 Inhaler 1  . calcium carbonate (OSCAL) 1500 (600 Ca) MG TABS tablet Take 600 mg of elemental calcium by mouth 3 (three) times daily with meals.    . cetirizine (ZYRTEC) 10 MG tablet Take 10 mg by mouth daily as needed for allergies.     Marland Kitchen guaiFENesin-dextromethorphan (ROBITUSSIN DM) 100-10 MG/5ML syrup Take 5 mLs by mouth every 6 (six) hours as needed for cough.    . Multiple Vitamin (MULTIVITAMIN WITH MINERALS) TABS tablet Take 1 tablet by mouth daily.    . potassium chloride SA (K-DUR,KLOR-CON) 20 MEQ tablet Take 20 mEq by mouth 2 (two) times daily.    . sacubitril-valsartan (ENTRESTO) 97-103 MG Take 1 tablet by mouth 2 (two) times daily. 60 tablet 11  . spironolactone (ALDACTONE) 25 MG tablet Take 1 tablet (25 mg total) by mouth daily. 90 tablet 3  . torsemide (DEMADEX) 20 MG tablet Take 2 tablets (40 mg total)  by mouth daily. 60 tablet 11   No current facility-administered medications for this encounter.    Vitals:   11/26/17 0838  BP: (!) 152/98  Pulse: 76  SpO2: 100%  Weight: 199 lb 3.2 oz (90.4 kg)     Wt Readings from Last 3 Encounters:  11/26/17 199 lb 3.2 oz (90.4 kg)  10/13/17 196 lb 12.8 oz (89.3 kg)  08/17/17 196 lb (88.9 kg)   PHYSICAL EXAM: General: Well appearing. No resp difficulty. HEENT: Normal Neck: Supple. JVP 5-6. Carotids 2+ bilat; no bruits. No thyromegaly or nodule noted. Cor: PMI nondisplaced. RRR, No M/G/R noted Lungs: CTAB, normal effort. Abdomen: Soft, non-tender, non-distended, no HSM. No bruits or masses. +BS  Extremities: No cyanosis, clubbing, or rash. R and LLE no edema.  Neuro: Alert & orientedx3, cranial nerves grossly intact. moves all 4 extremities w/o difficulty. Affect pleasant   ASSESSMENT & PLAN:  1) Chronic combined systolic/diastolic heart failure with recovered EF, NICM s/p ICD likely r/t HTN, last echo 1/16 EF 25-30%. Cath 8/16 EF No CAD. ECHO 01/2017 EF 55-60% - NYHA I - Volume status stable on exam. - Continue torsemide 40 mg daily. Can take extra 20 mg as needed. - Continue Entresto 97/103 mg BID. Stressed importance of compliance.  - Continue spironolactone 25 mg daily. BMET today.  - Previously ran out of coreg and was not started back. EF now normal. Consider at next visit with Echo. Did not want to add new med today.  - Reinforced fluid restriction to < 2 L daily, sodium restriction to less than 2000 mg daily, and the importance of daily weights.   2) OSA  - Continue nightly CPAP.  3) HTN - Meds as above.  4) Asthma/chronic bronchitis - Stable.  5) Obesity: - S/p laparoscopic sleeve gastrectomy with hiatal hernia repair, upper endoscopy.  - Weight stable.  Doing well. Stressed importance of med compliance. Labs today.   [name], PA-C  8:41 AM   Greater than 50% of the 25 minute visit was spent in  counseling/coordination of care regarding disease state education, salt/fluid restriction, sliding scale diuretics, and medication compliance.

## 2017-11-26 NOTE — Patient Instructions (Signed)
All of your heart related medications have been sent to [facility].  Labs today We will only contact you if something comes back abnormal or we need to make some changes. Otherwise no news is good news!  Your physician has requested that you have an echocardiogram. Echocardiography is a painless test that uses sound waves to create images of your heart. It provides your doctor with information about the size and shape of your heart and how well your heart's chambers and valves are working. This procedure takes approximately one hour. There are no restrictions for this procedure.  Your physician recommends that you schedule a follow-up appointment in: 4 months with Dr Haroldine Laws  Do the following things EVERYDAY: 1) Weigh yourself in the morning before breakfast. Write it down and keep it in a log. 2) Take your medicines as prescribed 3) Eat low salt foods-Limit salt (sodium) to 2000 mg per day.  4) Stay as active as you can everyday 5) Limit all fluids for the day to less than 2 liters

## 2017-12-14 ENCOUNTER — Encounter: Payer: Managed Care, Other (non HMO) | Attending: General Surgery | Admitting: Registered"

## 2017-12-14 DIAGNOSIS — Z713 Dietary counseling and surveillance: Secondary | ICD-10-CM | POA: Insufficient documentation

## 2017-12-14 DIAGNOSIS — R7303 Prediabetes: Secondary | ICD-10-CM | POA: Insufficient documentation

## 2017-12-14 DIAGNOSIS — Z6837 Body mass index (BMI) 37.0-37.9, adult: Secondary | ICD-10-CM | POA: Diagnosis not present

## 2017-12-14 DIAGNOSIS — Z9884 Bariatric surgery status: Secondary | ICD-10-CM | POA: Diagnosis not present

## 2017-12-14 DIAGNOSIS — E669 Obesity, unspecified: Secondary | ICD-10-CM

## 2017-12-17 NOTE — Progress Notes (Signed)
Bariatric Class:  Appt start time: 6:00 end time: 7:00  12 Month Post-Operative Nutrition Class  Patient was seen on 12/14/2017 for Post-Operative Nutrition education at [facility].   Surgery date: May 5th Surgery type: Sleeve Gastrectomy  Start weight at [facility]: 257.14 pounds Weight today: 199.8 pounds Weight Change: 17 lbs from 216.8 (01/19/2017) Total Weight loss: 57.3 lbs  TANITA  BODY COMP RESULTS  09/14/2016 12/08/2016 01/19/2017 12/17/2017   BMI (kg/m^2) 47.6 42.8 39.7 37.8   Fat Mass (lbs) 133.6 112.6 99 82.6   Fat Free Mass (lbs) 122.4 121.6 117.8 117.2  TBW   85.4 84.4    The following the learning objectives were met by the patient during this course:  Review of TANITA scale information  Share and discuss bariatric surgery successes and non-scale victories  Identifies Phase VII (Maintenance Phase) Dietary Goals which will be lifelong  Identifies appropriate sources of fluids, proteins, non-starchy vegetables, and complex carbohydrates  Identifies well-balanced meals  Identifies portion control   Identifies appropriate multivitamin and calcium sources post-operatively  Describes the need for physical activity post-operatively and will follow MD recommendations  Identifies and describes SMART goals   Creates at least 2 SMART goals to begin immediately  States when to call healthcare provider regarding medication questions or post-operative complications  Handouts given during class include:  Phase VII: Maintenance Phase-Lifelong  Follow-Up Plan: Patient will follow-up at [facility] for on-going post-op nutrition visits.

## 2018-01-17 ENCOUNTER — Other Ambulatory Visit: Payer: Self-pay | Admitting: Obstetrics & Gynecology

## 2018-01-17 DIAGNOSIS — Z1231 Encounter for screening mammogram for malignant neoplasm of breast: Secondary | ICD-10-CM

## 2018-01-18 ENCOUNTER — Telehealth (HOSPITAL_COMMUNITY): Payer: Self-pay

## 2018-01-18 MED ORDER — SACUBITRIL-VALSARTAN 97-103 MG PO TABS: 1.0000 | ORAL_TABLET | Freq: Two times a day (BID) | ORAL | 3 refills | Status: DC

## 2018-01-18 MED ORDER — TORSEMIDE 20 MG PO TABS: 40.0000 mg | ORAL_TABLET | Freq: Every day | ORAL | 3 refills | Status: DC

## 2018-01-18 NOTE — Telephone Encounter (Signed)
Patient calling CHF clinic triage line to request 90 day supply refills of torsemide and entresto. Sent to preferred pharmacy electronically.  Renee Pain, RN

## 2018-02-03 ENCOUNTER — Ambulatory Visit
Admission: RE | Admit: 2018-02-03 | Discharge: 2018-02-03 | Disposition: A | Discharge status: Home / Self Care | Payer: Managed Care, Other (non HMO) | Source: Ambulatory Visit | Attending: Obstetrics & Gynecology | Admitting: Obstetrics & Gynecology

## 2018-02-03 DIAGNOSIS — Z1231 Encounter for screening mammogram for malignant neoplasm of breast: Secondary | ICD-10-CM

## 2018-02-21 ENCOUNTER — Encounter: Payer: Managed Care, Other (non HMO) | Admitting: *Deleted

## 2018-02-21 ENCOUNTER — Telehealth: Payer: Self-pay | Admitting: Cardiology

## 2018-02-21 NOTE — Telephone Encounter (Signed)
Spoke with pt and reminded pt of remote transmission that is due today. Pt verbalized understanding.   

## 2018-02-23 ENCOUNTER — Encounter: Payer: Self-pay | Admitting: Cardiology

## 2018-03-31 ENCOUNTER — Encounter (HOSPITAL_COMMUNITY): Payer: Managed Care, Other (non HMO) | Admitting: Internal Medicine

## 2018-03-31 ENCOUNTER — Ambulatory Visit (HOSPITAL_COMMUNITY): Payer: Managed Care, Other (non HMO)

## 2018-04-07 IMAGING — CT CT ANGIO CHEST
2 of 6 series · 18 of 36 positions shown · IV contrast (Omni 300)
Comparison: CXR 09/04/2016 pain and 05/04/2016

CLINICAL DATA: Right-sided chest pain since yesterday with dyspnea,
flu like symptoms.

EXAM:
CT ANGIOGRAPHY CHEST WITH CONTRAST
TECHNIQUE: Multidetector CT imaging of the chest was performed using the
standard protocol during bolus administration of intravenous
contrast. Multiplanar CT image reconstructions and MIPs were
obtained to evaluate the vascular anatomy.
CONTRAST:  100 cc Isovue 370 IV

[Series 6: pe thins · axial · 0.98mm/px · z∈[-89,+139]mm · 17 of 258 slices shown]
[im 15/258  lung]
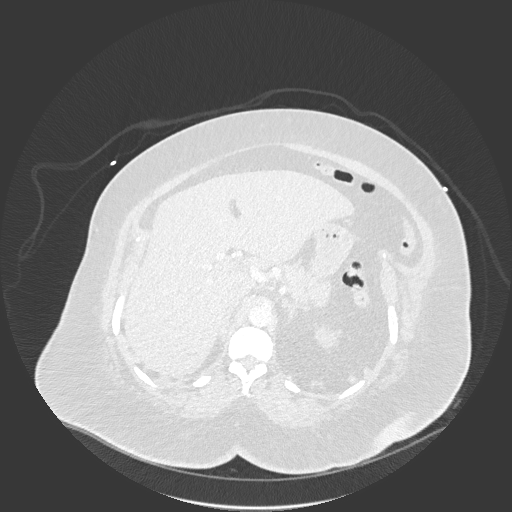
[im 29/258  mediastinal]
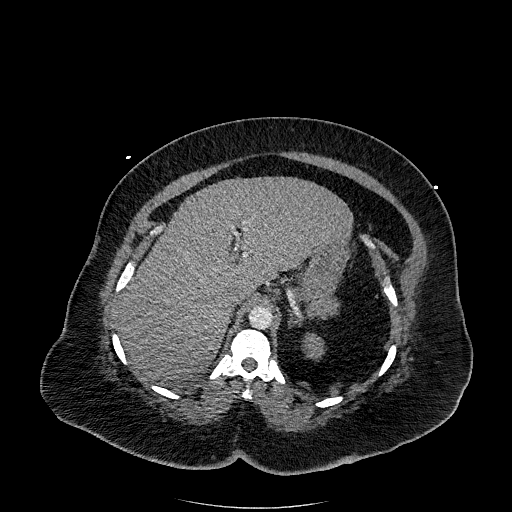
[im 43/258  lung]
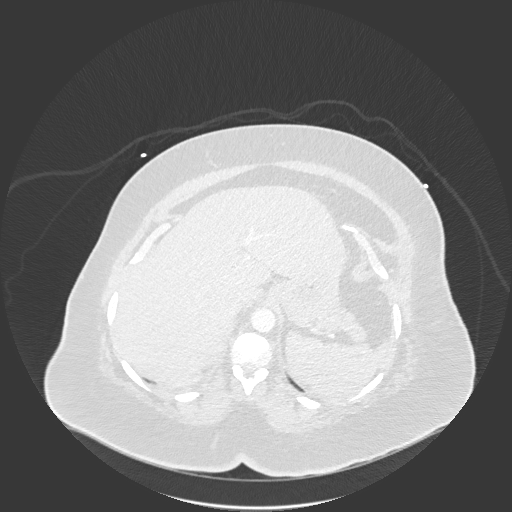
[im 58/258  mediastinal]
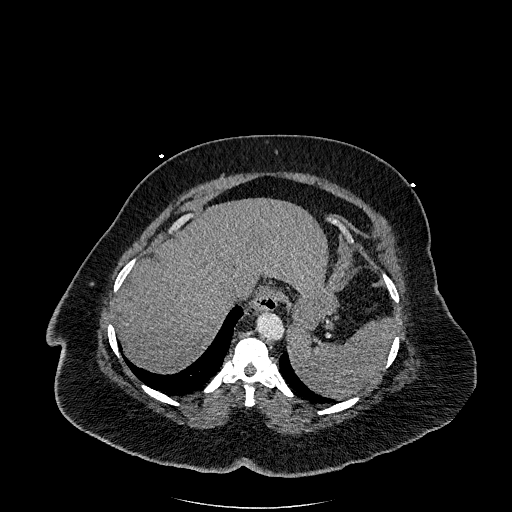
[im 72/258  lung]
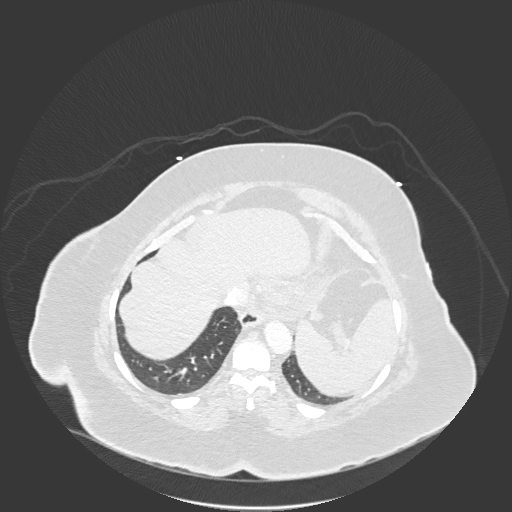
[im 86/258  mediastinal]
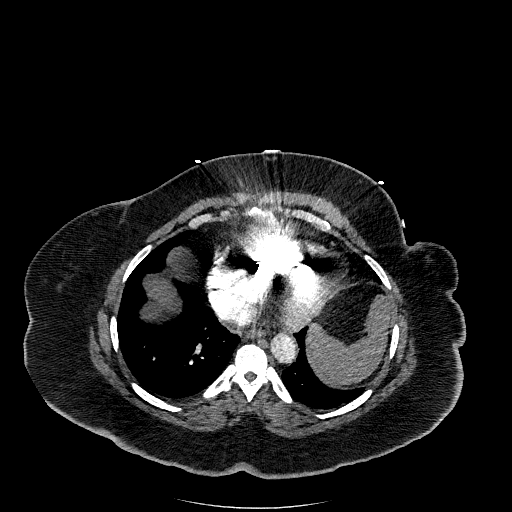
[im 100/258  lung]
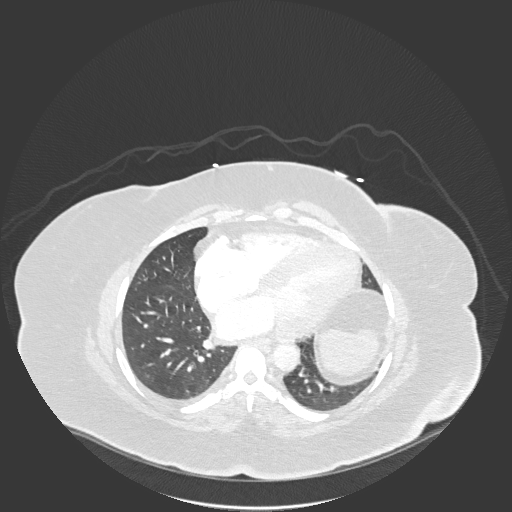
[im 115/258  mediastinal]
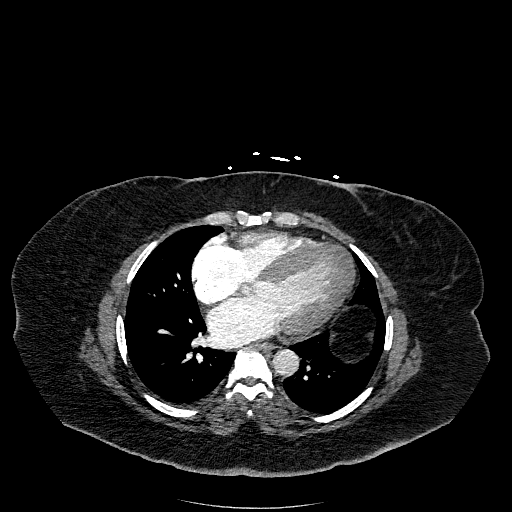
[im 129/258  lung]
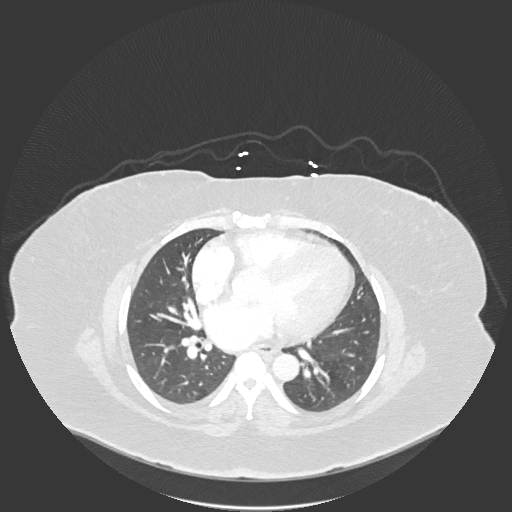
[im 143/258  mediastinal]
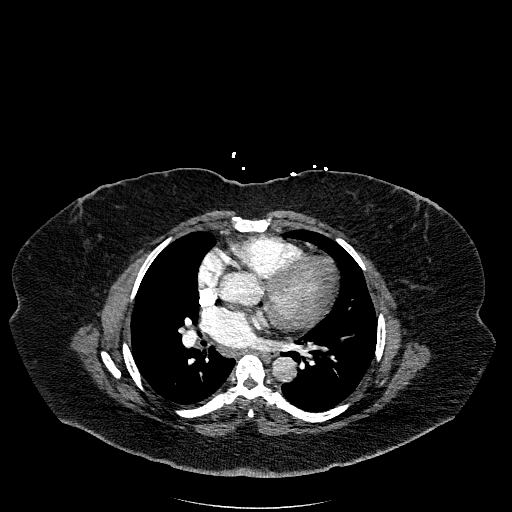
[im 158/258  lung]
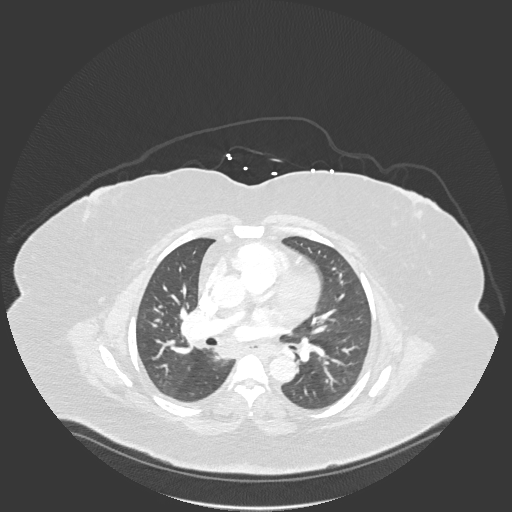
[im 172/258  mediastinal]
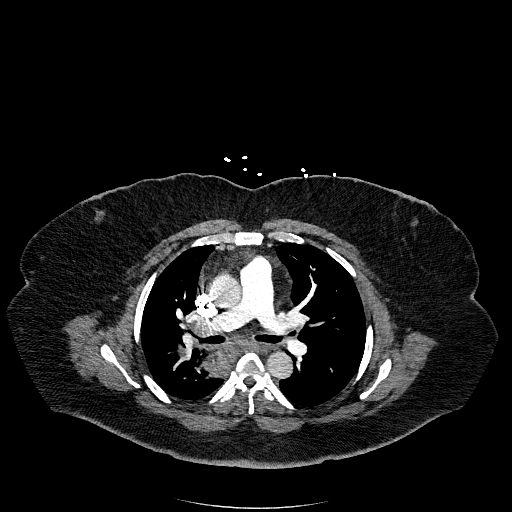
[im 186/258  lung]
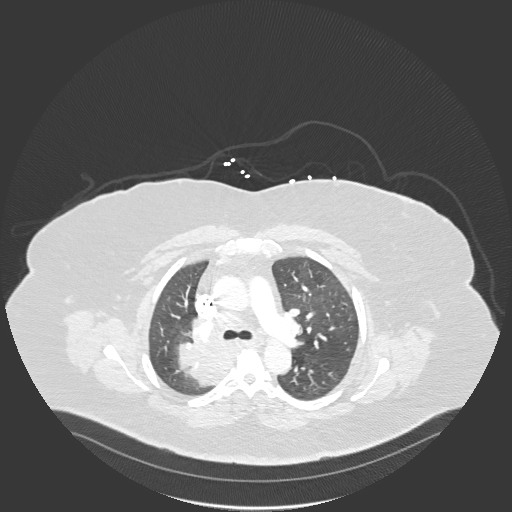
[im 200/258  mediastinal]
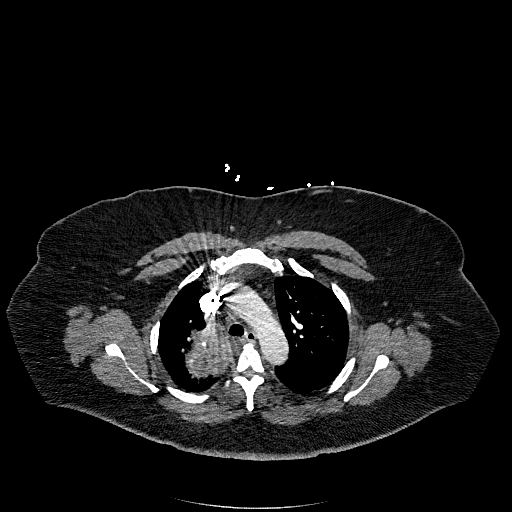
[im 215/258  lung]
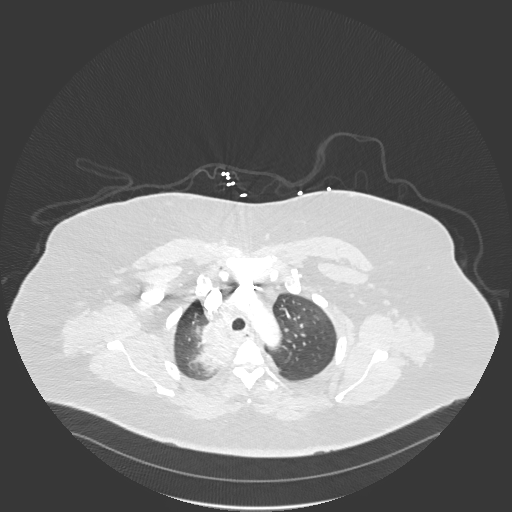
[im 229/258  mediastinal]
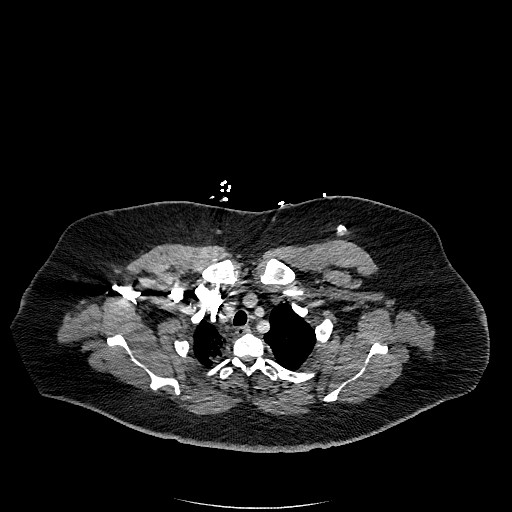
[im 243/258  lung]
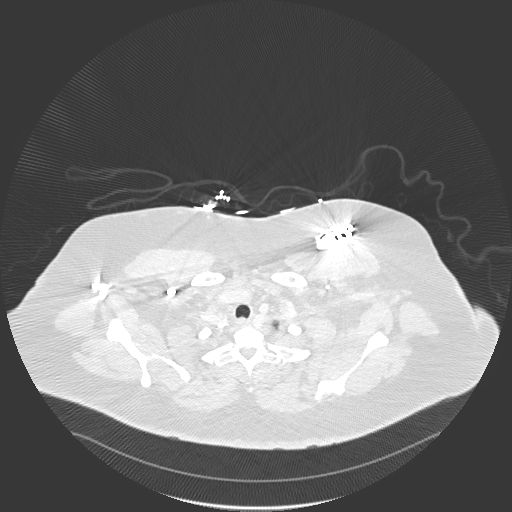

[Series 7: pe 2mm cor · coronal · 0.51mm/px · 1 of 135 slices shown]
[im 68/135  mediastinal]
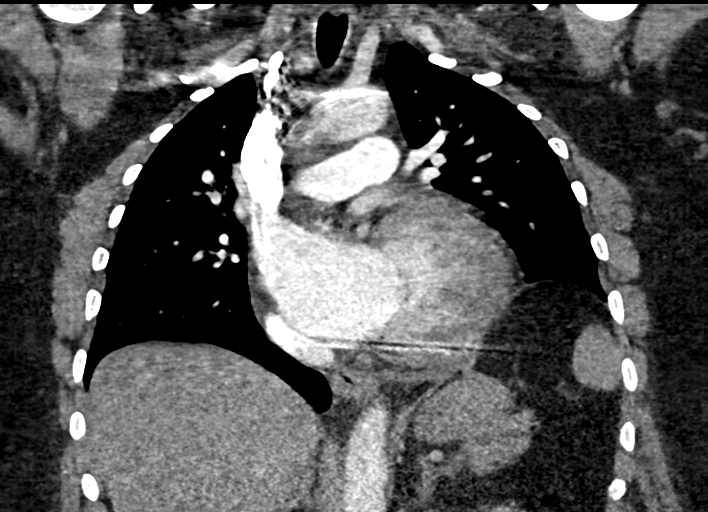

[18 of 36 positions shown; findings below may reference images not displayed]

FINDINGS: Cardiovascular: AICD device projects over the anterior left chest
wall with right ventricular defibrillator wire noted to cardiomegaly
is noted without pericardial effusion. No acute pulmonary embolus,
aortic aneurysm or dissection. Normal branch pattern for the great
vessels.

Mediastinum/Nodes: No supraclavicular nor axillary lymphadenopathy.
There is a suggestion of right hilar lymphadenopathy associated with
a large masslike opacity abutting the superior mediastinum, seen
within the right upper lobe and superior segment right lower lobe.
Precarinal 1.5 cm short axis lymph node is also noted.

Lungs/Pleura: Masslike consolidation in the right upper and superior
segment of the right lower lobe, measuring approximately 6.3 x 5.5 x
5.6 cm. Although findings may represent a pneumonic consolidation,
followup is recommended after appropriate antibiotic therapy to
ensure improvement and resolution and to exclude neoplasm. There is
no pneumothorax nor effusion. The contralateral left lung is clear.

Upper Abdomen: No adrenal mass nor space-occupying lesion of the
liver.

Musculoskeletal: No lytic or blastic disease.

Review of the MIP images confirms the above findings.
IMPRESSION: 1. Pulmonary masslike consolidation predominantly in the right upper
lobe and to a lesser extent superior segment of right lower lobe, at
its greatest measuring approximately 6.3 x 5.5 x 5.6 cm. Favor
infectious etiology given lack of similar findings of on the CXR
from 05/04/2016. A mass developing to this size in such short period
of time without other ancillary findings to suggest neoplasm would
be unusual. Nonetheless, follow-up to ensure clearance is
recommended after antibiotic therapy.
2. Probable reactive mediastinal and right hilar lymphadenopathy.
3. No acute central pulmonary embolus.

## 2018-04-28 IMAGING — DX DG CHEST 2V
2 series · 2 of 2 positions shown · non-contrast
Comparison: Chest radiograph and chest CT September 04, 2016

CLINICAL DATA: Recent pneumonia

EXAM:
CHEST  2 VIEW

[chest pa]
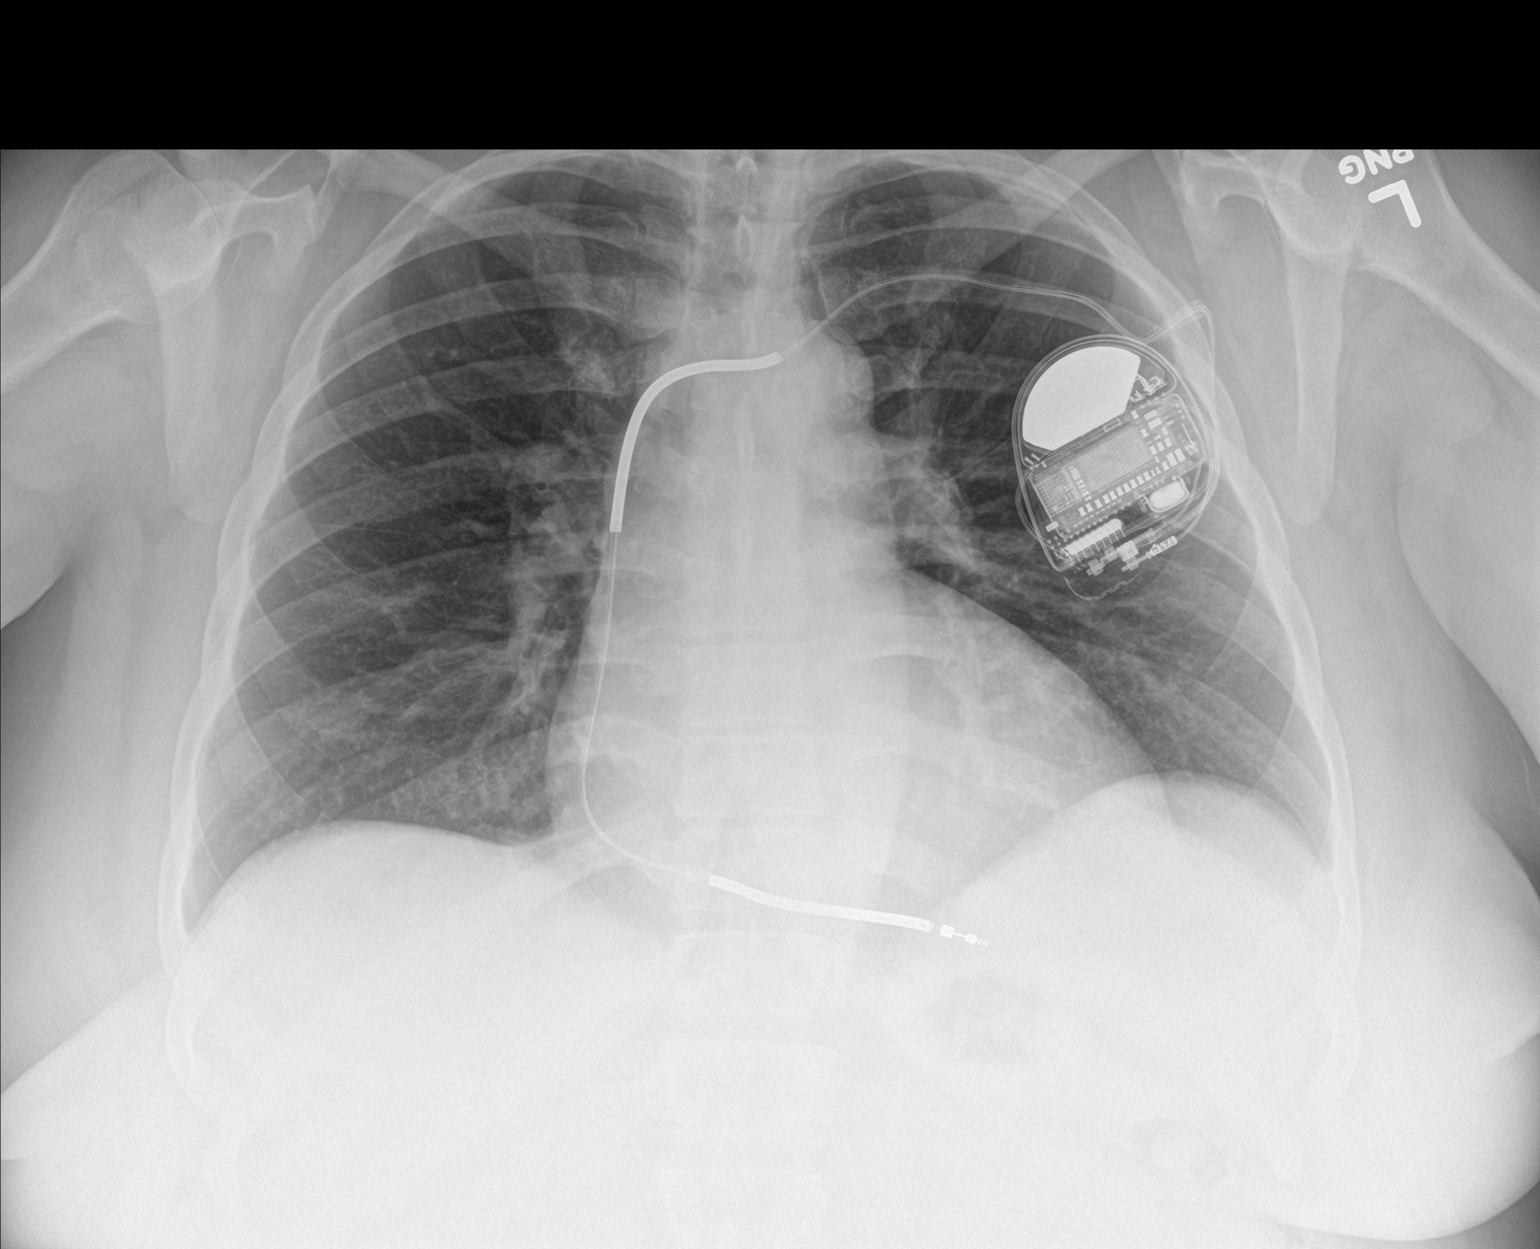

[chest lat]
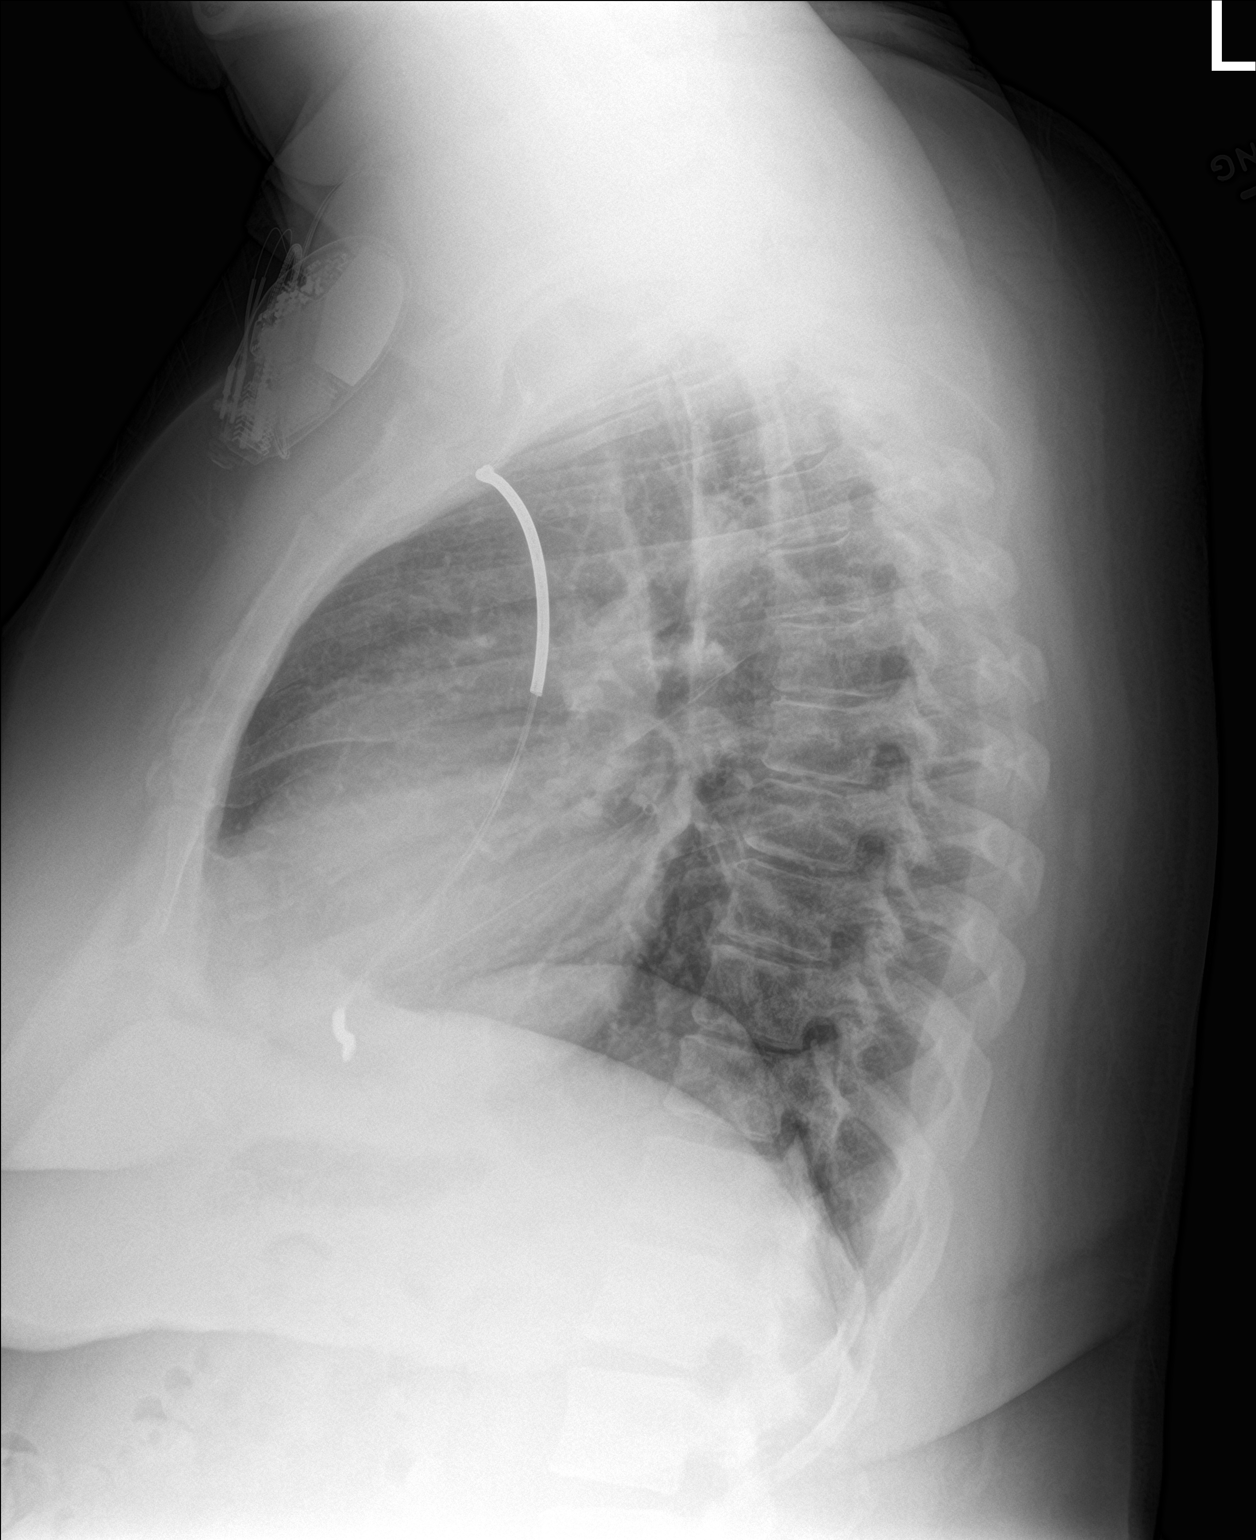

[2 of 2 positions shown; findings below may reference images not displayed]

FINDINGS: There has been interval clearing of airspace consolidation from the
right upper lobe. Currently there is no evident edema or
consolidation. Heart size and pulmonary vascularity are normal. No
adenopathy. Pacemaker lead is attached to the middle cardiac vein.
No bone lesions.
IMPRESSION: Interval clearing of right upper lobe airspace consolidation.
Currently no edema or consolidation. Stable cardiac silhouette. No
evident adenopathy.

## 2018-05-19 ENCOUNTER — Encounter: Payer: Self-pay | Admitting: Cardiology

## 2018-05-25 ENCOUNTER — Encounter (HOSPITAL_COMMUNITY): Payer: Self-pay | Admitting: Internal Medicine

## 2018-05-25 ENCOUNTER — Ambulatory Visit (HOSPITAL_COMMUNITY)
Admission: RE | Admit: 2018-05-25 | Discharge: 2018-05-25 | Disposition: A | Discharge status: Home / Self Care | Payer: Managed Care, Other (non HMO) | Source: Ambulatory Visit | Attending: Student | Admitting: Student

## 2018-05-25 ENCOUNTER — Other Ambulatory Visit: Payer: Self-pay

## 2018-05-25 ENCOUNTER — Ambulatory Visit (HOSPITAL_BASED_OUTPATIENT_CLINIC_OR_DEPARTMENT_OTHER)
Admission: RE | Admit: 2018-05-25 | Discharge: 2018-05-25 | Disposition: A | Discharge status: Home / Self Care | Payer: Managed Care, Other (non HMO) | Source: Ambulatory Visit | Attending: Internal Medicine | Admitting: Internal Medicine

## 2018-05-25 VITALS — BP 125/95 | HR 73 | Wt 205.6 lb

## 2018-05-25 DIAGNOSIS — G4733 Obstructive sleep apnea (adult) (pediatric): Secondary | ICD-10-CM | POA: Insufficient documentation

## 2018-05-25 DIAGNOSIS — I5022 Chronic systolic (congestive) heart failure: Secondary | ICD-10-CM

## 2018-05-25 DIAGNOSIS — I4891 Unspecified atrial fibrillation: Secondary | ICD-10-CM | POA: Insufficient documentation

## 2018-05-25 DIAGNOSIS — I5042 Chronic combined systolic (congestive) and diastolic (congestive) heart failure: Secondary | ICD-10-CM | POA: Diagnosis present

## 2018-05-25 DIAGNOSIS — I428 Other cardiomyopathies: Secondary | ICD-10-CM | POA: Insufficient documentation

## 2018-05-25 DIAGNOSIS — J45909 Unspecified asthma, uncomplicated: Secondary | ICD-10-CM | POA: Diagnosis not present

## 2018-05-25 DIAGNOSIS — E669 Obesity, unspecified: Secondary | ICD-10-CM | POA: Diagnosis not present

## 2018-05-25 DIAGNOSIS — I110 Hypertensive heart disease with heart failure: Secondary | ICD-10-CM | POA: Insufficient documentation

## 2018-05-25 DIAGNOSIS — I10 Essential (primary) hypertension: Secondary | ICD-10-CM

## 2018-05-25 DIAGNOSIS — Z9884 Bariatric surgery status: Secondary | ICD-10-CM | POA: Diagnosis not present

## 2018-05-25 DIAGNOSIS — Z8249 Family history of ischemic heart disease and other diseases of the circulatory system: Secondary | ICD-10-CM | POA: Diagnosis not present

## 2018-05-25 DIAGNOSIS — Z79899 Other long term (current) drug therapy: Secondary | ICD-10-CM | POA: Diagnosis not present

## 2018-05-25 DIAGNOSIS — Z95810 Presence of automatic (implantable) cardiac defibrillator: Secondary | ICD-10-CM | POA: Insufficient documentation

## 2018-05-25 LAB — BRAIN NATRIURETIC PEPTIDE: B Natriuretic Peptide: 109.6 pg/mL — ABNORMAL HIGH (ref 0.0–100.0)

## 2018-05-25 MED ORDER — BISOPROLOL FUMARATE 5 MG PO TABS: 2.5000 mg | ORAL_TABLET | Freq: Every day | ORAL | 6 refills | Status: DC

## 2018-05-25 NOTE — Progress Notes (Signed)
Patient ID: [name], female   DOB: 09-Nov-1975, 42 y.o.   MRN: [id number]    Advanced Heart Failure Clinic Note   PCP: Dr. Heath Gold  OB:  Dr. Leo Grosser  EP: Dr. Lovena Le CHF: [facility]  HPI: [name] is a 42 y.o. female with h/o obesity, chronic systolic heart failure due to nonischemic cardiomyopathy (cath 2007. No CAD) s/p Medtronic ICD implantation - now with recovered EF, asthma/chronic bronchitis and severe hypertension.   Seen in ED 05/04/16 was having atypical chest pain and jaw pain. Thought to be mildly volume overloaded.   Admitted 11/24/16-11/26/16 for Laparoscopic Sleeve Gastrectomy with hiatal hernia repair, upper endoscopy. Discharge weight was 251 pounds.   Admitted 12/22 - 12/24 with hypotension and viral URI. + Flu A. Improved with IV fluids. No chronic med changes  01/2017 EF 60-65%   She returns for HF follow up. Echo today showed EF ~ 30%. Feels fine. 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 Has not been taking meds regularly. Not on b-blocker due to fatigue. Missing spiro about 3-4x/week. SBP 120-130 typically.     Optivol - No VT/AF. Activity level ~3hrs/day Volume up and down a little recently but no crossings. Personally reviewed    ECHOs 04/27/2011 45-50%.   08/20/11 ECHO 55-60%   05/19/12 EF ~40%   07/01/12 EF ~40%   08/02/12 EF~40%    2/14 EF ~50%                          5/14 EF ~35%                          1/16 EF 25-30%                           3/17 EF 55-60% Grade I DD   01/2017 EF 60-65%                           CPX 01/2014: Resting HR 75, Peak HR 121 Peak VO2 15.2, 74.4% predicted VE/VCO2 slope: 26.4 OUES: 1.93 Peak RER 1.15 VE/MVV 61.7%  SH: Married. Lives in [place], 2 children. Working at [employer]. No ETOH or smoking  FH: Mother living: DM2, HTN, no CAD        Father living: DM2, HTN  Review of systems complete and found to be negative unless listed in HPI.   Past Medical  History:  Diagnosis Date  . Acute bronchitis and bronchiolitis   . Anemia    Iron supplements in the past  . Asthma    Inhaler prn;triggered by seasonal changes mostly cold weathert;last asthma  attack years ago  . Atrial fibrillation ([place])   . CHF (congestive heart failure) ([place])   . Chronic systolic CHF (congestive heart failure), NYHA class 2 ([place]) 01/2006   Has MDT Defibrillator, EF had improved by 2017 echo  . Hypertension   . Hypertension   . Infection    UTI;not frequent;last infection was in June  . Infection    Yeast;not frequent  . Migraines    Can't take Procardia XL, causes migraines  . Other primary cardiomyopathies   . Paroxysmal ventricular tachycardia ([place])   . Varicose veins 1998   Developed during last pregnancy   Current Outpatient Medications  Medication Sig Dispense Refill  .  albuterol (PROVENTIL HFA;VENTOLIN HFA) 108 (90 Base) MCG/ACT inhaler Inhale 2 puffs into the lungs every 6 (six) hours as needed for wheezing or shortness of breath. Rescue inhaler 1 Inhaler 1  . calcium carbonate (OSCAL) 1500 (600 Ca) MG TABS tablet Take 600 mg of elemental calcium by mouth 3 (three) times daily with meals.    . cetirizine (ZYRTEC) 10 MG tablet Take 10 mg by mouth daily as needed for allergies.     Marland Kitchen guaiFENesin-dextromethorphan (ROBITUSSIN DM) 100-10 MG/5ML syrup Take 5 mLs by mouth every 6 (six) hours as needed for cough.    . Multiple Vitamin (MULTIVITAMIN WITH MINERALS) TABS tablet Take 1 tablet by mouth daily.    . potassium chloride SA (K-DUR,KLOR-CON) 20 MEQ tablet Take 1 tablet (20 mEq total) by mouth 2 (two) times daily. 180 tablet 3  . sacubitril-valsartan (ENTRESTO) 97-103 MG Take 1 tablet by mouth 2 (two) times daily. 180 tablet 3  . spironolactone (ALDACTONE) 25 MG tablet Take 1 tablet (25 mg total) by mouth daily. 90 tablet 3  . torsemide (DEMADEX) 20 MG tablet Take 2 tablets (40 mg total) by mouth daily. 180 tablet 3   No current facility-administered  medications for this encounter.    Vitals:   05/25/18 1004  BP: (!) 125/95  Pulse: 73  SpO2: 100%  Weight: 93.3 kg (205 lb 9.6 oz)     Wt Readings from Last 3 Encounters:  12/17/17 90.6 kg (199 lb 12.8 oz)  11/26/17 90.4 kg (199 lb 3.2 oz)  10/13/17 89.3 kg (196 lb 12.8 oz)   PHYSICAL EXAM: General:  Well appearing. No resp difficulty HEENT: normal Neck: supple. no JVD. Carotids 2+ bilat; no bruits. No lymphadenopathy or thryomegaly appreciated. Cor: PMI nondisplaced. Regular rate & rhythm. No rubs, gallops or murmurs. Lungs: clear Abdomen: soft, nontender, nondistended. No hepatosplenomegaly. No bruits or masses. Good bowel sounds. Extremities: no cyanosis, clubbing, rash, edema Neuro: alert & orientedx3, cranial nerves grossly intact. moves all 4 extremities w/o difficulty. Affect pleasant  ECG: NSR 77. LVH Personally reviewed  ASSESSMENT & PLAN:  1) Chronic combined systolic/diastolic heart failure with recovered EF, NICM s/p ICD likely r/t HTN, last echo 1/16 EF 25-30%. Cath 8/16 ECHO 01/2017 EF 55-60% - Echo today EF 30% - Remains NYHA I. Despite recurrent LV dysfunction.  - Volume status looks ok. A bit up and down on Optivol.  - Stressed need for med compliance. Will restart Entresto and spiro at current dose.  - Will start bisoprolol 2.5 qhs - Continue torsemide 40 mg daily. Can take extra 20 mg as needed.  - Reinforced fluid restriction to < 2 L daily, sodium restriction to less than 2000 mg daily, and the importance of daily weights.   2) OSA  - Continue nightly CPAP. No change.  3) HTN - Meds as above 4) Asthma/chronic bronchitis - Stable. No change.  5) Obesity: - S/p laparoscopic sleeve gastrectomy with hiatal hernia repair, upper endoscopy.  - Weight stable. No change.   Glori Bickers, MD  10:58 AM

## 2018-05-25 NOTE — Patient Instructions (Addendum)
Resume Entresto and Spironolactone  START Bisoprolol 2.5mg  QHS  Labs drawn today  Your physician recommends that you schedule a follow-up appointment in: 2 months and Echo in 4 months  Your physician has requested that you have an echocardiogram. Echocardiography is a painless test that uses sound waves to create images of your heart. It provides your doctor with information about the size and shape of your heart and how well your heart's chambers and valves are working. This procedure takes approximately one hour. There are no restrictions for this procedure.

## 2018-05-25 NOTE — Progress Notes (Signed)
  Echocardiogram 2D Echocardiogram has been performed.  [name] 05/25/2018, 9:47 AM

## 2018-06-13 ENCOUNTER — Ambulatory Visit (INDEPENDENT_AMBULATORY_CARE_PROVIDER_SITE_OTHER): Payer: Managed Care, Other (non HMO)

## 2018-06-13 DIAGNOSIS — I5022 Chronic systolic (congestive) heart failure: Secondary | ICD-10-CM | POA: Diagnosis not present

## 2018-06-14 ENCOUNTER — Other Ambulatory Visit (HOSPITAL_COMMUNITY): Payer: Self-pay

## 2018-06-14 MED ORDER — POTASSIUM CHLORIDE CRYS ER 20 MEQ PO TBCR: 20.0000 meq | EXTENDED_RELEASE_TABLET | Freq: Two times a day (BID) | ORAL | 3 refills | Status: DC

## 2018-06-14 NOTE — Progress Notes (Signed)
Remote ICD transmission.   

## 2018-06-16 ENCOUNTER — Encounter: Payer: Self-pay | Admitting: Cardiology

## 2018-07-13 ENCOUNTER — Ambulatory Visit (INDEPENDENT_AMBULATORY_CARE_PROVIDER_SITE_OTHER): Payer: Managed Care, Other (non HMO) | Admitting: Internal Medicine

## 2018-07-13 ENCOUNTER — Encounter: Payer: Self-pay | Admitting: Internal Medicine

## 2018-07-13 VITALS — BP 124/82 | HR 71 | Ht 62.0 in | Wt 218.0 lb

## 2018-07-13 DIAGNOSIS — I5022 Chronic systolic (congestive) heart failure: Secondary | ICD-10-CM

## 2018-07-13 DIAGNOSIS — Z95810 Presence of automatic (implantable) cardiac defibrillator: Secondary | ICD-10-CM | POA: Diagnosis not present

## 2018-07-13 MED ORDER — CEPHALEXIN 500 MG PO CAPS: 500.0000 mg | ORAL_CAPSULE | Freq: Two times a day (BID) | ORAL | 0 refills | Status: DC

## 2018-07-13 NOTE — Patient Instructions (Addendum)
Medication Instructions:  Your physician has recommended you make the following change in your medication:   1.  Start taking Keflex 500 mg--Take one tablet by mouth twice a day for 7 days.  If your wound does not improve after this course of antibiotics OR if you develop a fever go to URGENT CARE.  Labwork: None ordered.  Testing/Procedures: None ordered.  Follow-Up: Your physician wants you to follow-up in: one year with Dr. Lovena Le.   You will receive a reminder letter in the mail two months in advance. If you don't receive a letter, please call our office to schedule the follow-up appointment.  Remote monitoring is used to monitor your ICD from home. This monitoring reduces the number of office visits required to check your device to one time per year. It allows [place] to keep an eye on the functioning of your device to ensure it is working properly. You are scheduled for a device check from home on 09/12/2018. You may send your transmission at any time that day. If you have a wireless device, the transmission will be sent automatically. After your physician reviews your transmission, you will receive a postcard with your next transmission date.  Any Other Special Instructions Will Be Listed Below (If Applicable).  If you need a refill on your cardiac medications before your next appointment, please call your pharmacy.

## 2018-07-13 NOTE — Progress Notes (Signed)
HPI [name] returns today for ICD followup. She has a h/o VT, non-ischemic CM, obesity and HTN. She has not received any ICD therapy. Her EF has been up and down, mostly recently up. She has not had anginal symptoms. She denies peripheral edema. She admits to dietary indiscretion. She has a sore on her chest. Allergies  Allergen Reactions  . Percocet [Oxycodone-Acetaminophen] Shortness Of Breath    Tolerates acetaminophen     Current Outpatient Medications  Medication Sig Dispense Refill  . albuterol (PROVENTIL HFA;VENTOLIN HFA) 108 (90 Base) MCG/ACT inhaler Inhale 2 puffs into the lungs every 6 (six) hours as needed for wheezing or shortness of breath. Rescue inhaler 1 Inhaler 1  . bisoprolol (ZEBETA) 5 MG tablet Take 0.5 tablets (2.5 mg total) by mouth at bedtime. 15 tablet 6  . calcium carbonate (OSCAL) 1500 (600 Ca) MG TABS tablet Take 600 mg of elemental calcium by mouth 3 (three) times daily with meals.    . cetirizine (ZYRTEC) 10 MG tablet Take 10 mg by mouth daily as needed for allergies.     . Multiple Vitamin (MULTIVITAMIN WITH MINERALS) TABS tablet Take 1 tablet by mouth daily.    . potassium chloride SA (K-DUR,KLOR-CON) 20 MEQ tablet Take 1 tablet (20 mEq total) by mouth 2 (two) times daily. 180 tablet 3  . sacubitril-valsartan (ENTRESTO) 97-103 MG Take 1 tablet by mouth 2 (two) times daily. 180 tablet 3  . spironolactone (ALDACTONE) 25 MG tablet Take 1 tablet (25 mg total) by mouth daily. 90 tablet 3  . torsemide (DEMADEX) 20 MG tablet Take 2 tablets (40 mg total) by mouth daily. 180 tablet 3   No current facility-administered medications for this visit.      Past Medical History:  Diagnosis Date  . Acute bronchitis and bronchiolitis   . Anemia    Iron supplements in the past  . Asthma    Inhaler prn;triggered by seasonal changes mostly cold weathert;last asthma  attack years ago  . Atrial fibrillation ([place])   . CHF (congestive heart failure) ([place])   .  Chronic systolic CHF (congestive heart failure), NYHA class 2 ([place]) 01/2006   Has MDT Defibrillator, EF had improved by 2017 echo  . Hypertension   . Hypertension   . Infection    UTI;not frequent;last infection was in June  . Infection    Yeast;not frequent  . Migraines    Can't take Procardia XL, causes migraines  . Other primary cardiomyopathies   . Paroxysmal ventricular tachycardia ([place])   . Varicose veins 1998   Developed during last pregnancy    ROS:   All systems reviewed and negative except as noted in the HPI.   Past Surgical History:  Procedure Laterality Date  . BREAST REDUCTION SURGERY  03/05/1994   d/t back, shoulder, and neck pain (42 F)  . CARDIAC CATHETERIZATION     EF 30-35%  . CARDIAC CATHETERIZATION N/A 05/04/2015   Procedure: Left Heart Cath and Coronary Angiography;  Surgeon: Belva Crome, MD;  Location: [facility];  Service: Cardiovascular;  Laterality: N/A;  . CARDIAC DEFIBRILLATOR PLACEMENT    . CESAREAN SECTION  1998   d/t preeclampsia  . IMPLANTABLE CARDIOVERTER DEFIBRILLATOR (ICD) GENERATOR CHANGE N/A 09/10/2014   Procedure: ICD GENERATOR CHANGE;  Surgeon: Evans Lance, MD;  Location: [facility];  Service: Cardiovascular;  Laterality: N/A;  . INSERT / REPLACE / REMOVE PACEMAKER   02/10/2006   Status post implantable cardioverter-defibrillator  insertion  .Marland KitchenMarland KitchenMarland KitchenThe Medtronic Maximo VR, model 7232, single chamber  . LAPAROSCOPIC GASTRIC SLEEVE RESECTION WITH HIATAL HERNIA REPAIR N/A 11/24/2016   Procedure: LAPAROSCOPIC GASTRIC SLEEVE RESECTION WITH HIATAL HERNIA REPAIR, UPPER ENDO;  Surgeon: Greer Pickerel, MD;  Location: [facility];  Service: General;  Laterality: N/A;  . LAPAROSCOPIC OVARIAN CYSTECTOMY Bilateral 11/24/2016   Procedure: LAPAROSCOPIC BILATERAL OVARIAN CYSTECTOMY;  Surgeon: Janyth Pupa, DO;  Location: [facility];  Service: Gynecology;  Laterality: Bilateral;  . REDUCTION MAMMAPLASTY Bilateral      Family History  Problem Relation Age of  Onset  . Sickle cell trait Daughter   . Other Father        Varicose veins  . Hypertension Father   . Diabetes Father        Controlled w/ diet and exercise  . Colon polyps Father        approx 3-4  . Asthma Daughter   . Seizures Maternal Uncle   . Migraines Mother   . Ulcers Mother   . Rheum arthritis Mother   . Colon polyps Mother        approx 2 polyps  . Migraines Daughter   . Migraines Cousin        Maternal  . Uterine cancer Paternal Grandmother        d. 60-61  . Prostate cancer Maternal Uncle        dx 69-70; surgery and rad  . Lung cancer Maternal Uncle 65       d. 69-70; smoker  . Lupus Maternal Aunt   . Hypertension Paternal Aunt   . Breast cancer Paternal Aunt   . Other Daughter        blood transfusion @ birth;platelets were low  . Diabetes Maternal Aunt   . Lung cancer Maternal Aunt        dx 71-72; +chemo  . Diabetes Paternal Aunt        x 2  . Breast cancer Paternal Aunt   . Diabetes Paternal Uncle   . Hypertension Unknown   . Heart disease Unknown   . Cancer Cousin        maternal 1st cousin d. early 65s; NOS cancer  . Breast cancer Cousin        maternal 1st cousin dx 55-48  . Breast cancer Other        maternal great aunt (MGM's sister) d. breast cancer in her late 88s  . Kidney cancer Cousin        paternal 1st cousin dx 84s; nephrectomy; not a smoker  . Ovarian cancer Paternal Aunt 22       paternal aunt (father's maternal half-sister)  . Breast cancer Paternal Aunt   . Ovarian cancer Paternal Aunt   . Uterine cancer Paternal Aunt   . Cancer Other        paternal great aunt (PGM's sister) dx NOS cancer  . Cancer Other        paternal great uncle (PGM's brother) dx NOS cancer  . Colon cancer Neg Hx      Social History   Socioeconomic History  . Marital status: Legally Separated    Spouse name: Tanisha Lutes  . Number of children: 1  . Years of education: 47  . Highest education level: Not on file  Occupational History  .  Occupation: Asst. Freight forwarder    Comment: [employer]  . Occupation: Aeronautical engineer: [place]  . Financial resource strain: Not on  file  . Food insecurity:    Worry: Not on file    Inability: Not on file  . Transportation needs:    Medical: Not on file    Non-medical: Not on file  Tobacco Use  . Smoking status: Never Smoker  . Smokeless tobacco: Never Used  Substance and Sexual Activity  . Alcohol use: No  . Drug use: No  . Sexual activity: Yes    Partners: Male    Birth control/protection: Condom, None  Lifestyle  . Physical activity:    Days per week: Not on file    Minutes per session: Not on file  . Stress: Not on file  Relationships  . Social connections:    Talks on phone: Not on file    Gets together: Not on file    Attends religious service: Not on file    Active member of club or organization: Not on file    Attends meetings of clubs or organizations: Not on file    Relationship status: Not on file  . Intimate partner violence:    Fear of current or ex partner: Not on file    Emotionally abused: Not on file    Physically abused: Not on file    Forced sexual activity: Not on file  Other Topics Concern  . Not on file  Social History Narrative   ** Merged History Encounter **         BP 124/82   Pulse 71   Ht 5\' 2"  (1.575 m)   Wt 218 lb (98.9 kg)   SpO2 98%   BMI 39.87 kg/m   Physical Exam:  Well appearing NAD HEENT: Unremarkable Neck:  No JVD, no thyromegally Lymphatics:  No adenopathy Back:  No CVA tenderness Lungs:  Clear with no wheezes HEART:  Regular rate rhythm, no murmurs, no rubs, no clicks Abd:  soft, positive bowel sounds, no organomegally, no rebound, no guarding Ext:  2 plus pulses, no edema, no cyanosis, no clubbing Skin:  No rashes no nodules, one cm carbuncle on chest Neuro:  CN II through XII intact, motor grossly intact   DEVICE  Normal device function.  See PaceArt for details.    Assess/Plan: 1. VT - she has had no recurrent episodes. She will continue her current meds. 2. ICD - her medtronic device is working normally.  3. Chronic systolic heart failure - her symptoms are class 2. She will continue her current meds. 4. Obesity - her weight is up 6 lbs. I asked her to lose weight. 5. Carbuncle - she has a 1 cm carbuncle on her chest about 5 cm from the medial aspect of her ICD incision. No evidence of involvement with the device pocket. I will prescribe her some keflex and asked her to go to urgent care to have it lanced if it does not heal on its own.   Mikle Bosworth.D.

## 2018-07-15 LAB — CUP PACEART INCLINIC DEVICE CHECK
Implantable Lead Implant Date: 20070718
Implantable Lead Location: 753860
MDC IDC PG IMPLANT DT: 20160215
MDC IDC SESS DTM: 20191220112916

## 2018-07-22 ENCOUNTER — Other Ambulatory Visit: Payer: Self-pay | Admitting: Internal Medicine

## 2018-08-01 ENCOUNTER — Encounter (HOSPITAL_COMMUNITY): Payer: Self-pay | Admitting: Internal Medicine

## 2018-08-01 ENCOUNTER — Ambulatory Visit (HOSPITAL_COMMUNITY)
Admission: RE | Admit: 2018-08-01 | Discharge: 2018-08-01 | Disposition: A | Discharge status: Home / Self Care | Payer: Managed Care, Other (non HMO) | Source: Ambulatory Visit | Attending: Internal Medicine | Admitting: Internal Medicine

## 2018-08-01 ENCOUNTER — Telehealth (HOSPITAL_COMMUNITY): Payer: Self-pay

## 2018-08-01 VITALS — BP 110/82 | HR 67 | Wt 216.2 lb

## 2018-08-01 DIAGNOSIS — J45909 Unspecified asthma, uncomplicated: Secondary | ICD-10-CM | POA: Diagnosis not present

## 2018-08-01 DIAGNOSIS — Z95810 Presence of automatic (implantable) cardiac defibrillator: Secondary | ICD-10-CM | POA: Diagnosis not present

## 2018-08-01 DIAGNOSIS — I5022 Chronic systolic (congestive) heart failure: Secondary | ICD-10-CM

## 2018-08-01 DIAGNOSIS — I4891 Unspecified atrial fibrillation: Secondary | ICD-10-CM | POA: Insufficient documentation

## 2018-08-01 DIAGNOSIS — I428 Other cardiomyopathies: Secondary | ICD-10-CM | POA: Diagnosis not present

## 2018-08-01 DIAGNOSIS — I10 Essential (primary) hypertension: Secondary | ICD-10-CM | POA: Diagnosis not present

## 2018-08-01 DIAGNOSIS — I110 Hypertensive heart disease with heart failure: Secondary | ICD-10-CM | POA: Insufficient documentation

## 2018-08-01 DIAGNOSIS — I5042 Chronic combined systolic (congestive) and diastolic (congestive) heart failure: Secondary | ICD-10-CM | POA: Insufficient documentation

## 2018-08-01 DIAGNOSIS — G4733 Obstructive sleep apnea (adult) (pediatric): Secondary | ICD-10-CM | POA: Diagnosis not present

## 2018-08-01 DIAGNOSIS — Z79899 Other long term (current) drug therapy: Secondary | ICD-10-CM | POA: Diagnosis not present

## 2018-08-01 DIAGNOSIS — E669 Obesity, unspecified: Secondary | ICD-10-CM | POA: Insufficient documentation

## 2018-08-01 DIAGNOSIS — Z8249 Family history of ischemic heart disease and other diseases of the circulatory system: Secondary | ICD-10-CM | POA: Diagnosis not present

## 2018-08-01 LAB — BASIC METABOLIC PANEL
Anion gap: 8 (ref 5–15)
BUN: 14 mg/dL (ref 6–20)
CHLORIDE: 104 mmol/L (ref 98–111)
CO2: 28 mmol/L (ref 22–32)
Calcium: 9.1 mg/dL (ref 8.9–10.3)
Creatinine, Ser: 1.06 mg/dL — ABNORMAL HIGH (ref 0.44–1.00)
GFR calc Af Amer: >60 mL/min (ref >60)
GFR calc non Af Amer: >60 mL/min (ref >60)
GLUCOSE: 106 mg/dL — AB (ref 70–99)
POTASSIUM: 3.4 mmol/L — AB (ref 3.5–5.1)
Sodium: 140 mmol/L (ref 135–145)

## 2018-08-01 MED ORDER — FLUCONAZOLE 200 MG PO TABS: 200.0000 mg | ORAL_TABLET | Freq: Every day | ORAL | 0 refills | Status: AC

## 2018-08-01 MED ORDER — POTASSIUM CHLORIDE CRYS ER 20 MEQ PO TBCR: 40.0000 meq | EXTENDED_RELEASE_TABLET | Freq: Two times a day (BID) | ORAL | 3 refills | Status: DC

## 2018-08-01 NOTE — Patient Instructions (Signed)
Labs done today.  Please take Diflucan 200mg  (1 tab) daily for 3 days. This has been sent to your pharmacy.   Your physician recommends that you schedule a follow-up appointment in 3-4 months.

## 2018-08-01 NOTE — Telephone Encounter (Signed)
Spoke with pt to increase her k to 19meq bid per bensimhon. Pt aware, agreeable and verbalized understanding.

## 2018-08-01 NOTE — Addendum Note (Signed)
Encounter addended by: Jovita Kussmaul, RN on: 08/01/2018 9:56 AM  Actions taken: Diagnosis association updated, Pharmacy for encounter modified, Order list changed, Charge Capture section accepted

## 2018-08-01 NOTE — Progress Notes (Signed)
Patient ID: [name], female   DOB: 1976/01/15, 43 y.o.   MRN: [id number]    Advanced Heart Failure Clinic Note   PCP: Dr. Heath Gold  OB:  Dr. Leo Grosser  EP: Dr. Lovena Le CHF:   HPI: [name] is a 43 y.o. female with h/o obesity, chronic systolic heart failure due to nonischemic cardiomyopathy (cath 2007. No CAD) s/p Medtronic ICD implantation - now with recovered EF, asthma/chronic bronchitis and severe hypertension.   Seen in ED 05/04/16 was having atypical chest pain and jaw pain. Thought to be mildly volume overloaded.   Admitted 11/24/16-11/26/16 for Laparoscopic Sleeve Gastrectomy with hiatal hernia repair, upper endoscopy. Discharge weight was 251 pounds.   Admitted 12/22 - 12/24 with hypotension and viral URI. + Flu A. Improved with IV fluids. No chronic med changes  01/2017 EF 60-65%   04/2018 EF 25-30% (in setting of stopping meds)  Returns for routine follow-up. Feeling much better. Got back from 1 week vacation in [place]. Taking all meds as prescribed. Several times per week has presyncopal episode. Doesn't take BP when it happens. Feels good. Only dyspneic if really pushing or having too much fluid. No orthopnea or PND. Still going to gym.   Optivol - No VT/AF. Activity 3-4 hrs/day. Volume status mildly elevated but coming back down. Personally reviewed    ECHOs 04/27/2011 45-50%.   08/20/11 ECHO 55-60%   05/19/12 EF ~40%   07/01/12 EF ~40%   08/02/12 EF~40%    2/14 EF ~50%                          5/14 EF ~35%                          1/16 EF 25-30%                           3/17 EF 55-60% Grade I DD    01/2017 EF 60-65%                          04/2018 EF 25-30% (in setting of stopping meds)                          CPX 01/2014: Resting HR 75, Peak HR 121 Peak VO2 15.2, 74.4% predicted VE/VCO2 slope: 26.4 OUES: 1.93 Peak RER 1.15 VE/MVV 61.7%  SH: Married. Lives in [place], 2 children. Working at [employer]. No ETOH or smoking  FH: Mother living:  DM2, HTN, no CAD        Father living: DM2, HTN  Review of systems complete and found to be negative unless listed in HPI.   Past Medical History:  Diagnosis Date  . Acute bronchitis and bronchiolitis   . Anemia    Iron supplements in the past  . Asthma    Inhaler prn;triggered by seasonal changes mostly cold weathert;last asthma  attack years ago  . Atrial fibrillation ([place])   . CHF (congestive heart failure) ([place])   . Chronic systolic CHF (congestive heart failure), NYHA class 2 ([place]) 01/2006   Has MDT Defibrillator, EF had improved by 2017 echo  . Hypertension   . Hypertension   . Infection    UTI;not frequent;last infection was in June  . Infection    Yeast;not frequent  . Migraines  Can't take Procardia XL, causes migraines  . Other primary cardiomyopathies   . Paroxysmal ventricular tachycardia ([place])   . Varicose veins 1998   Developed during last pregnancy   Current Outpatient Medications  Medication Sig Dispense Refill  . albuterol (PROVENTIL HFA;VENTOLIN HFA) 108 (90 Base) MCG/ACT inhaler Inhale 2 puffs into the lungs every 6 (six) hours as needed for wheezing or shortness of breath. Rescue inhaler 1 Inhaler 1  . bisoprolol (ZEBETA) 5 MG tablet Take 0.5 tablets (2.5 mg total) by mouth at bedtime. 15 tablet 6  . calcium carbonate (OSCAL) 1500 (600 Ca) MG TABS tablet Take 600 mg of elemental calcium by mouth 3 (three) times daily with meals.    . cetirizine (ZYRTEC) 10 MG tablet Take 10 mg by mouth daily as needed for allergies.     . Multiple Vitamin (MULTIVITAMIN WITH MINERALS) TABS tablet Take 1 tablet by mouth daily.    . potassium chloride SA (K-DUR,KLOR-CON) 20 MEQ tablet Take 1 tablet (20 mEq total) by mouth 2 (two) times daily. 180 tablet 3  . sacubitril-valsartan (ENTRESTO) 97-103 MG Take 1 tablet by mouth 2 (two) times daily. 180 tablet 3  . spironolactone (ALDACTONE) 25 MG tablet Take 1 tablet (25 mg total) by mouth daily. 90 tablet 3  . torsemide (DEMADEX)  20 MG tablet Take 2 tablets (40 mg total) by mouth daily. 180 tablet 3   No current facility-administered medications for this encounter.    Vitals:   08/01/18 0859  BP: 110/82  Pulse: 67  SpO2: 96%  Weight: 98.1 kg (216 lb 3.2 oz)     Wt Readings from Last 3 Encounters:  08/01/18 98.1 kg (216 lb 3.2 oz)  07/13/18 98.9 kg (218 lb)  05/25/18 93.3 kg (205 lb 9.6 oz)   PHYSICAL EXAM: General:  Well appearing. No resp difficulty HEENT: normal Neck: supple. no JVD. Carotids 2+ bilat; no bruits. No lymphadenopathy or thryomegaly appreciated. Cor: PMI nondisplaced. Regular rate & rhythm. No rubs, gallops or murmurs. Lungs: clear Abdomen: soft, nontender, nondistended. No hepatosplenomegaly. No bruits or masses. Good bowel sounds. Extremities: no cyanosis, clubbing, rash, edema Neuro: alert & orientedx3, cranial nerves grossly intact. moves all 4 extremities w/o difficulty. Affect pleasant   ASSESSMENT & PLAN:  1) Chronic combined systolic/diastolic heart failure with recovered EF, NICM s/p ICD likely r/t HTN, last echo 1/16 EF 25-30%. Cath 8/16 ECHO 01/2017 EF 55-60% - Echo 10-19 EF 25-30% in setting of medication noncompliance - Remains NYHA I. Despite recurrent LV dysfunction.  - Volume status looks ok. A bit up and down on Optivol. ICD interrogated personally.  - Continue Entresto 97/103 bid, spiro 25 daily, bisoprolol 2.5  - Continue torsemide 40 mg daily. Can take extra 20 mg as needed.  - Will check BP at home routinely if SBP < 100 on several occasions can decrease Entresto to 49/51 bid - Repeat echo next month to see if EF improves now back on medicines.  - Reinforced fluid restriction to < 2 L daily, sodium restriction to less than 2000 mg daily, and the importance of daily weights.   - BMET today 2) OSA  - Resolved with weight loss 3) HTN - Meds as above 4) Obesity: - S/p laparoscopic sleeve gastrectomy with hiatal hernia repair, upper endoscopy.    Glori Bickers, MD  9:35 AM

## 2018-08-09 ENCOUNTER — Ambulatory Visit (HOSPITAL_COMMUNITY)
Admission: RE | Admit: 2018-08-09 | Discharge: 2018-08-09 | Disposition: A | Discharge status: Home / Self Care | Payer: Managed Care, Other (non HMO) | Source: Ambulatory Visit | Attending: Cardiology | Admitting: Cardiology

## 2018-08-09 DIAGNOSIS — I5022 Chronic systolic (congestive) heart failure: Secondary | ICD-10-CM | POA: Diagnosis not present

## 2018-08-09 LAB — CUP PACEART REMOTE DEVICE CHECK
Battery Remaining Longevity: 108 mo
Brady Statistic RV Percent Paced: 0.01 %
Date Time Interrogation Session: 20191116213833
HIGH POWER IMPEDANCE MEASURED VALUE: 45 Ohm
HighPow Impedance: 57 Ohm
Lead Channel Impedance Value: 285 Ohm
Lead Channel Impedance Value: 361 Ohm
Lead Channel Pacing Threshold Amplitude: 1.625 V
Lead Channel Sensing Intrinsic Amplitude: 22.375 mV
Lead Channel Setting Pacing Amplitude: 3.5 V
Lead Channel Setting Pacing Pulse Width: 0.7 ms
Lead Channel Setting Sensing Sensitivity: 0.3 mV
MDC IDC LEAD IMPLANT DT: 20070718
MDC IDC LEAD LOCATION: 753860
MDC IDC MSMT BATTERY VOLTAGE: 3.01 V
MDC IDC MSMT LEADCHNL RV PACING THRESHOLD PULSEWIDTH: 0.4 ms
MDC IDC MSMT LEADCHNL RV SENSING INTR AMPL: 22.375 mV
MDC IDC PG IMPLANT DT: 20160215

## 2018-08-09 LAB — BASIC METABOLIC PANEL
ANION GAP: 10 (ref 5–15)
BUN: 19 mg/dL (ref 6–20)
CALCIUM: 8.5 mg/dL — AB (ref 8.9–10.3)
CO2: 28 mmol/L (ref 22–32)
Chloride: 105 mmol/L (ref 98–111)
Creatinine, Ser: 1.17 mg/dL — ABNORMAL HIGH (ref 0.44–1.00)
GFR calc Af Amer: >60 mL/min (ref >60)
GFR, EST NON AFRICAN AMERICAN: 57 mL/min — AB (ref >60)
GLUCOSE: 101 mg/dL — AB (ref 70–99)
Potassium: 4.4 mmol/L (ref 3.5–5.1)
Sodium: 143 mmol/L (ref 135–145)

## 2018-08-11 ENCOUNTER — Telehealth (HOSPITAL_COMMUNITY): Payer: Self-pay

## 2018-08-11 NOTE — Telephone Encounter (Signed)
Prior authorization through [employer] insurance company was initiated for entresto 97-103 medication and sent via [place] on 08/11/2018.  No prior authorization needed for this medication.

## 2018-09-12 ENCOUNTER — Ambulatory Visit (INDEPENDENT_AMBULATORY_CARE_PROVIDER_SITE_OTHER): Payer: Managed Care, Other (non HMO)

## 2018-09-12 DIAGNOSIS — I43 Cardiomyopathy in diseases classified elsewhere: Secondary | ICD-10-CM

## 2018-09-12 DIAGNOSIS — I119 Hypertensive heart disease without heart failure: Secondary | ICD-10-CM

## 2018-09-12 DIAGNOSIS — I5022 Chronic systolic (congestive) heart failure: Secondary | ICD-10-CM

## 2018-09-12 DIAGNOSIS — I472 Ventricular tachycardia: Secondary | ICD-10-CM

## 2018-09-12 DIAGNOSIS — I4729 Other ventricular tachycardia: Secondary | ICD-10-CM

## 2018-09-13 LAB — CUP PACEART REMOTE DEVICE CHECK
HIGH POWER IMPEDANCE MEASURED VALUE: 51 Ohm
HighPow Impedance: 42 Ohm
Implantable Lead Implant Date: 20070718
Implantable Lead Location: 753860
Implantable Pulse Generator Implant Date: 20160215
Lead Channel Pacing Threshold Amplitude: 1.25 V
Lead Channel Pacing Threshold Pulse Width: 0.4 ms
Lead Channel Sensing Intrinsic Amplitude: 19.5 mV
Lead Channel Sensing Intrinsic Amplitude: 19.5 mV
Lead Channel Setting Pacing Pulse Width: 0.7 ms
MDC IDC MSMT BATTERY REMAINING LONGEVITY: 104 mo
MDC IDC MSMT BATTERY VOLTAGE: 3.01 V
MDC IDC MSMT LEADCHNL RV IMPEDANCE VALUE: 304 Ohm
MDC IDC MSMT LEADCHNL RV IMPEDANCE VALUE: 342 Ohm
MDC IDC SESS DTM: 20200217072604
MDC IDC SET LEADCHNL RV PACING AMPLITUDE: 3.5 V
MDC IDC SET LEADCHNL RV SENSING SENSITIVITY: 0.3 mV
MDC IDC STAT BRADY RV PERCENT PACED: 0.01 %

## 2018-09-21 NOTE — Progress Notes (Signed)
Remote ICD transmission.   

## 2018-09-26 ENCOUNTER — Ambulatory Visit (HOSPITAL_COMMUNITY)
Admission: RE | Admit: 2018-09-26 | Discharge: 2018-09-26 | Disposition: A | Discharge status: Home / Self Care | Payer: Managed Care, Other (non HMO) | Source: Ambulatory Visit | Attending: Internal Medicine | Admitting: Internal Medicine

## 2018-09-26 ENCOUNTER — Other Ambulatory Visit (HOSPITAL_COMMUNITY): Payer: Self-pay

## 2018-09-26 DIAGNOSIS — I5022 Chronic systolic (congestive) heart failure: Secondary | ICD-10-CM | POA: Diagnosis not present

## 2018-09-26 DIAGNOSIS — I110 Hypertensive heart disease with heart failure: Secondary | ICD-10-CM | POA: Diagnosis not present

## 2018-09-26 DIAGNOSIS — E119 Type 2 diabetes mellitus without complications: Secondary | ICD-10-CM | POA: Insufficient documentation

## 2018-09-26 DIAGNOSIS — I4891 Unspecified atrial fibrillation: Secondary | ICD-10-CM | POA: Diagnosis not present

## 2018-09-26 NOTE — Progress Notes (Signed)
  Echocardiogram 2D Echocardiogram has been performed.  Christie Viscomi G Jemarion Roycroft 09/26/2018, 4:10 PM

## 2018-09-29 ENCOUNTER — Other Ambulatory Visit: Payer: Self-pay | Admitting: Cardiology

## 2018-11-07 ENCOUNTER — Other Ambulatory Visit (HOSPITAL_COMMUNITY): Payer: Self-pay

## 2018-11-07 MED ORDER — SPIRONOLACTONE 25 MG PO TABS: 25.0000 mg | ORAL_TABLET | Freq: Every day | ORAL | 3 refills | Status: DC

## 2018-12-12 ENCOUNTER — Ambulatory Visit (INDEPENDENT_AMBULATORY_CARE_PROVIDER_SITE_OTHER): Payer: Self-pay | Admitting: *Deleted

## 2018-12-12 ENCOUNTER — Other Ambulatory Visit: Payer: Self-pay

## 2018-12-12 DIAGNOSIS — I472 Ventricular tachycardia: Secondary | ICD-10-CM

## 2018-12-12 DIAGNOSIS — I4729 Other ventricular tachycardia: Secondary | ICD-10-CM

## 2018-12-13 LAB — CUP PACEART REMOTE DEVICE CHECK
Battery Remaining Longevity: 101 mo
Battery Voltage: 3.01 V
Brady Statistic RV Percent Paced: 0.01 %
Date Time Interrogation Session: 20200518052304
HighPow Impedance: 42 Ohm
HighPow Impedance: 55 Ohm
Implantable Lead Implant Date: 20070718
Implantable Lead Location: 753860
Implantable Pulse Generator Implant Date: 20160215
Lead Channel Impedance Value: 285 Ohm
Lead Channel Impedance Value: 361 Ohm
Lead Channel Pacing Threshold Amplitude: 1.5 V
Lead Channel Pacing Threshold Pulse Width: 0.4 ms
Lead Channel Sensing Intrinsic Amplitude: 18 mV
Lead Channel Sensing Intrinsic Amplitude: 18 mV
Lead Channel Setting Pacing Amplitude: 3.5 V
Lead Channel Setting Pacing Pulse Width: 0.7 ms
Lead Channel Setting Sensing Sensitivity: 0.3 mV

## 2018-12-22 ENCOUNTER — Encounter: Payer: Self-pay | Admitting: Cardiology

## 2018-12-22 NOTE — Progress Notes (Signed)
Remote ICD transmission.   

## 2019-03-13 ENCOUNTER — Ambulatory Visit (INDEPENDENT_AMBULATORY_CARE_PROVIDER_SITE_OTHER): Payer: Self-pay | Admitting: *Deleted

## 2019-03-13 DIAGNOSIS — I471 Supraventricular tachycardia: Secondary | ICD-10-CM

## 2019-03-13 DIAGNOSIS — I5022 Chronic systolic (congestive) heart failure: Secondary | ICD-10-CM

## 2019-03-14 ENCOUNTER — Telehealth: Payer: Self-pay

## 2019-03-14 NOTE — Telephone Encounter (Signed)
Spoke with patient to remind of missed remote transmission 

## 2019-03-21 LAB — CUP PACEART REMOTE DEVICE CHECK
Battery Remaining Longevity: 95 mo
Battery Voltage: 3 V
Brady Statistic RV Percent Paced: 0.01 %
Date Time Interrogation Session: 20200824105932
HighPow Impedance: 38 Ohm
HighPow Impedance: 45 Ohm
Implantable Lead Implant Date: 20070718
Implantable Lead Location: 753860
Implantable Pulse Generator Implant Date: 20160215
Lead Channel Impedance Value: 247 Ohm
Lead Channel Impedance Value: 304 Ohm
Lead Channel Pacing Threshold Amplitude: 2.125 V
Lead Channel Pacing Threshold Pulse Width: 0.4 ms
Lead Channel Sensing Intrinsic Amplitude: 15.75 mV
Lead Channel Sensing Intrinsic Amplitude: 15.75 mV
Lead Channel Setting Pacing Amplitude: 3.5 V
Lead Channel Setting Pacing Pulse Width: 0.7 ms
Lead Channel Setting Sensing Sensitivity: 0.3 mV

## 2019-03-22 NOTE — Progress Notes (Signed)
Remote ICD transmission.   

## 2019-03-27 ENCOUNTER — Other Ambulatory Visit (HOSPITAL_COMMUNITY): Payer: Self-pay

## 2019-03-27 MED ORDER — TORSEMIDE 20 MG PO TABS: 40.0000 mg | ORAL_TABLET | Freq: Every day | ORAL | 3 refills | Status: DC

## 2019-06-12 ENCOUNTER — Ambulatory Visit (INDEPENDENT_AMBULATORY_CARE_PROVIDER_SITE_OTHER): Payer: Self-pay | Admitting: *Deleted

## 2019-06-12 DIAGNOSIS — I5022 Chronic systolic (congestive) heart failure: Secondary | ICD-10-CM

## 2019-06-12 DIAGNOSIS — I471 Supraventricular tachycardia: Secondary | ICD-10-CM

## 2019-06-13 ENCOUNTER — Encounter (HOSPITAL_COMMUNITY): Payer: Self-pay

## 2019-06-14 LAB — CUP PACEART REMOTE DEVICE CHECK
Battery Remaining Longevity: 92 mo
Battery Voltage: 2.99 V
Brady Statistic RV Percent Paced: 0.01 %
Date Time Interrogation Session: 20201118054851
HighPow Impedance: 46 Ohm
HighPow Impedance: 57 Ohm
Implantable Lead Implant Date: 20070718
Implantable Lead Location: 753860
Implantable Pulse Generator Implant Date: 20160215
Lead Channel Impedance Value: 342 Ohm
Lead Channel Impedance Value: 399 Ohm
Lead Channel Pacing Threshold Amplitude: 1.875 V
Lead Channel Pacing Threshold Pulse Width: 0.4 ms
Lead Channel Sensing Intrinsic Amplitude: 16.25 mV
Lead Channel Sensing Intrinsic Amplitude: 16.25 mV
Lead Channel Setting Pacing Amplitude: 3.5 V
Lead Channel Setting Pacing Pulse Width: 0.7 ms
Lead Channel Setting Sensing Sensitivity: 0.3 mV

## 2019-07-07 NOTE — Progress Notes (Signed)
Remote ICD transmission.   

## 2019-07-18 ENCOUNTER — Other Ambulatory Visit (HOSPITAL_COMMUNITY): Payer: Self-pay

## 2019-07-18 MED ORDER — POTASSIUM CHLORIDE CRYS ER 20 MEQ PO TBCR: 40.0000 meq | EXTENDED_RELEASE_TABLET | Freq: Two times a day (BID) | ORAL | 3 refills | Status: DC

## 2019-08-21 ENCOUNTER — Ambulatory Visit: Payer: 59 | Attending: Internal Medicine

## 2019-08-21 DIAGNOSIS — Z20822 Contact with and (suspected) exposure to covid-19: Secondary | ICD-10-CM | POA: Insufficient documentation

## 2019-08-22 LAB — NOVEL CORONAVIRUS, NAA: SARS-CoV-2, NAA: NOT DETECTED

## 2019-09-11 ENCOUNTER — Ambulatory Visit (INDEPENDENT_AMBULATORY_CARE_PROVIDER_SITE_OTHER): Payer: 59 | Admitting: *Deleted

## 2019-09-11 DIAGNOSIS — I471 Supraventricular tachycardia: Secondary | ICD-10-CM

## 2019-09-11 LAB — CUP PACEART REMOTE DEVICE CHECK
Battery Remaining Longevity: 86 mo
Battery Voltage: 3 V
Brady Statistic RV Percent Paced: 0.01 %
Date Time Interrogation Session: 20210215002204
HighPow Impedance: 46 Ohm
HighPow Impedance: 58 Ohm
Implantable Lead Implant Date: 20070718
Implantable Lead Location: 753860
Implantable Pulse Generator Implant Date: 20160215
Lead Channel Impedance Value: 304 Ohm
Lead Channel Impedance Value: 361 Ohm
Lead Channel Pacing Threshold Amplitude: 1.75 V
Lead Channel Pacing Threshold Pulse Width: 0.4 ms
Lead Channel Sensing Intrinsic Amplitude: 23 mV
Lead Channel Sensing Intrinsic Amplitude: 23 mV
Lead Channel Setting Pacing Amplitude: 3.5 V
Lead Channel Setting Pacing Pulse Width: 0.7 ms
Lead Channel Setting Sensing Sensitivity: 0.3 mV

## 2019-09-12 NOTE — Progress Notes (Signed)
ICD Remote  

## 2019-10-23 ENCOUNTER — Emergency Department (HOSPITAL_BASED_OUTPATIENT_CLINIC_OR_DEPARTMENT_OTHER)
Admission: EM | Admit: 2019-10-23 | Discharge: 2019-10-23 | Disposition: A | Discharge status: Home / Self Care | Payer: Self-pay | Attending: Emergency Medicine | Admitting: Emergency Medicine

## 2019-10-23 ENCOUNTER — Other Ambulatory Visit: Payer: Self-pay

## 2019-10-23 ENCOUNTER — Encounter (HOSPITAL_BASED_OUTPATIENT_CLINIC_OR_DEPARTMENT_OTHER): Payer: Self-pay | Admitting: *Deleted

## 2019-10-23 ENCOUNTER — Emergency Department (HOSPITAL_BASED_OUTPATIENT_CLINIC_OR_DEPARTMENT_OTHER): Payer: Self-pay

## 2019-10-23 DIAGNOSIS — M79674 Pain in right toe(s): Secondary | ICD-10-CM | POA: Insufficient documentation

## 2019-10-23 LAB — PREGNANCY, URINE: Preg Test, Ur: NEGATIVE

## 2019-10-23 MED ORDER — PREDNISONE 50 MG PO TABS
60.0000 mg | ORAL_TABLET | Freq: Once | ORAL | Status: AC
  Administered 2019-10-23: 60 mg via ORAL
  Filled 2019-10-23: qty 1

## 2019-10-23 MED ORDER — HYDROCODONE-ACETAMINOPHEN 5-325 MG PO TABS: 1.0000 | ORAL_TABLET | ORAL | 0 refills | Status: DC | PRN

## 2019-10-23 MED ORDER — PREDNISONE 20 MG PO TABS: 40.0000 mg | ORAL_TABLET | Freq: Every day | ORAL | 0 refills | Status: AC

## 2019-10-23 MED ORDER — HYDROCODONE-ACETAMINOPHEN 5-325 MG PO TABS
2.0000 | ORAL_TABLET | Freq: Once | ORAL | Status: AC
  Administered 2019-10-23: 2 via ORAL
  Filled 2019-10-23: qty 2

## 2019-10-23 NOTE — ED Provider Notes (Signed)
[facility] EMERGENCY DEPARTMENT Provider Note   CSN: [id number] Arrival date & time: 10/23/19  1837     History Chief Complaint  Patient presents with  . Toe Pain    [name] is a 44 y.o. female who presents for evaluation of right first toe pain that has been ongoing for last 3 days.  She reports that initially it started off as a mild pain and states that it continued to get progressively worse.  She states she will get an intermittent tingling pain and then states that it is very sharp in nature.  She does not recall any trauma, injury.  She states it has not been red or hot.  She states it has been slightly swollen.  She states last night, even though she on her toe made it hurt.  She has been taking ibuprofen with no improvement in symptoms.  She has not had any fevers and denies any numbness/weakness.  She denies any history of gout.  The history is provided by the patient.       Past Medical History:  Diagnosis Date  . Acute bronchitis and bronchiolitis   . Anemia    Iron supplements in the past  . Asthma    Inhaler prn;triggered by seasonal changes mostly cold weathert;last asthma  attack years ago  . Atrial fibrillation ([place])   . CHF (congestive heart failure) ([place])   . Chronic systolic CHF (congestive heart failure), NYHA class 2 ([place]) 01/2006   Has MDT Defibrillator, EF had improved by 2017 echo  . Hypertension   . Hypertension   . Infection    UTI;not frequent;last infection was in June  . Infection    Yeast;not frequent  . Migraines    Can't take Procardia XL, causes migraines  . Other primary cardiomyopathies   . Paroxysmal ventricular tachycardia ([place])   . Varicose veins 1998   Developed during last pregnancy    Patient Active Problem List   Diagnosis Date Noted  . Atrial tachycardia ([place])   . Influenza A with respiratory manifestations   . Tachycardia 07/17/2017  . Hypotension 07/17/2017  . S/P laparoscopic sleeve gastrectomy  11/24/2016  . CAP (community acquired pneumonia) 09/10/2016  . Genetic testing 07/12/2016  . Family history of ovarian cancer 05/27/2016  . Family history of breast cancer in female 05/27/2016  . Pre-operative cardiovascular examination 12/11/2015  . Cough variant asthma vs UACS 11/19/2015  . Chronic diastolic heart failure ([place]) 10/29/2015  . Dyspnea 10/07/2015  . Chest pain 05/04/2015  . Bradycardia 05/04/2015  . Severe obesity (BMI >= 40) ([place]) 02/27/2014  . OSA (obstructive sleep apnea) 01/25/2014  . Chronic bronchitis ([place]) 01/25/2014  . Viral upper respiratory tract infection with cough 01/15/2014  . CHF (congestive heart failure) ([place]) 12/08/2013  . Uncontrolled hypertension 12/08/2013  . Hypertensive crisis 12/12/2012  . Supervision of high-risk pregnancy 06/16/2012  . Low lying placenta with hemorrhage, antepartum 06/16/2012  . Previous cesarean delivery, delivered, with or without mention of antepartum condition 06/16/2012  . undesired fertility 06/16/2012  . History of pre-eclampsia in prior pregnancy, currently pregnant 06/02/2012  . Marginal placenta previa 05/24/2012  . Vaginal bleeding in pregnancy 05/24/2012  . Bilateral ovarian cysts 05/24/2012  . Anemia 05/05/2012  . BV (bacterial vaginosis) 04/04/2012  . Pacemaker 04/04/2012  . Cardiac defibrillator in situ 04/04/2012  . Hypertension complicating pregnancy [id number]  . Cardiomyopathy, hypertensive ([place]) 02/03/2012  . Dizziness 12/17/2011  . Chronic systolic heart failure ([place]) 04/13/2011  . Hypertension  03/05/2011  . VENTRICULAR TACHYCARDIA, PAROXYSMAL 10/02/2010  . Nonischemic cardiomyopathy ([place]) 08/27/2009  . Atrial fibrillation ([place]) 08/27/2009  . CHF 08/27/2009    Past Surgical History:  Procedure Laterality Date  . BREAST REDUCTION SURGERY  03/05/1994   d/t back, shoulder, and neck pain (42 F)  . CARDIAC CATHETERIZATION     EF 30-35%  . CARDIAC CATHETERIZATION N/A 05/04/2015   Procedure: Left  Heart Cath and Coronary Angiography;  Surgeon: Belva Crome, MD;  Location: [facility] CV LAB;  Service: Cardiovascular;  Laterality: N/A;  . CARDIAC DEFIBRILLATOR PLACEMENT    . CESAREAN SECTION  1998   d/t preeclampsia  . IMPLANTABLE CARDIOVERTER DEFIBRILLATOR (ICD) GENERATOR CHANGE N/A 09/10/2014   Procedure: ICD GENERATOR CHANGE;  Surgeon: Evans Lance, MD;  Location: [facility] CATH LAB;  Service: Cardiovascular;  Laterality: N/A;  . INSERT / REPLACE / REMOVE PACEMAKER   02/10/2006   Status post implantable cardioverter-defibrillator insertion  .Marland KitchenMarland KitchenMarland KitchenThe Medtronic Maximo VR, model 7232, single chamber  . LAPAROSCOPIC GASTRIC SLEEVE RESECTION WITH HIATAL HERNIA REPAIR N/A 11/24/2016   Procedure: LAPAROSCOPIC GASTRIC SLEEVE RESECTION WITH HIATAL HERNIA REPAIR, UPPER ENDO;  Surgeon: Greer Pickerel, MD;  Location: [facility] ORS;  Service: General;  Laterality: N/A;  . LAPAROSCOPIC OVARIAN CYSTECTOMY Bilateral 11/24/2016   Procedure: LAPAROSCOPIC BILATERAL OVARIAN CYSTECTOMY;  Surgeon: Janyth Pupa, DO;  Location: [facility] ORS;  Service: Gynecology;  Laterality: Bilateral;  . REDUCTION MAMMAPLASTY Bilateral      OB History    Gravida  2   Para  1   Term      Preterm  1   AB      Living  1     SAB      TAB      Ectopic      Multiple      Live Births  1           Family History  Problem Relation Age of Onset  . Sickle cell trait Daughter   . Other Father        Varicose veins  . Hypertension Father   . Diabetes Father        Controlled w/ diet and exercise  . Colon polyps Father        approx 3-4  . Asthma Daughter   . Seizures Maternal Uncle   . Migraines Mother   . Ulcers Mother   . Rheum arthritis Mother   . Colon polyps Mother        approx 2 polyps  . Migraines Daughter   . Migraines Cousin        Maternal  . Uterine cancer Paternal Grandmother        d. 60-61  . Prostate cancer Maternal Uncle        dx 69-70; surgery and rad  . Lung cancer Maternal Uncle 65       d.  69-70; smoker  . Lupus Maternal Aunt   . Hypertension Paternal Aunt   . Breast cancer Paternal Aunt   . Other Daughter        blood transfusion @ birth;platelets were low  . Diabetes Maternal Aunt   . Lung cancer Maternal Aunt        dx 71-72; +chemo  . Diabetes Paternal Aunt        x 2  . Breast cancer Paternal Aunt   . Diabetes Paternal Uncle   . Hypertension Other   . Heart disease Other   . Cancer Cousin  maternal 1st cousin d. early 17s; NOS cancer  . Breast cancer Cousin        maternal 1st cousin dx 32-48  . Breast cancer Other        maternal great aunt (MGM's sister) d. breast cancer in her late 29s  . Kidney cancer Cousin        paternal 1st cousin dx 78s; nephrectomy; not a smoker  . Ovarian cancer Paternal Aunt 50       paternal aunt (father's maternal half-sister)  . Breast cancer Paternal Aunt   . Ovarian cancer Paternal Aunt   . Uterine cancer Paternal Aunt   . Cancer Other        paternal great aunt (PGM's sister) dx NOS cancer  . Cancer Other        paternal great uncle (PGM's brother) dx NOS cancer  . Colon cancer Neg Hx     Social History   Tobacco Use  . Smoking status: Never Smoker  . Smokeless tobacco: Never Used  Substance Use Topics  . Alcohol use: No  . Drug use: No    Home Medications Prior to Admission medications   Medication Sig Start Date End Date Taking? Authorizing Provider  albuterol (PROVENTIL HFA;VENTOLIN HFA) 108 (90 Base) MCG/ACT inhaler Inhale 2 puffs into the lungs every 6 (six) hours as needed for wheezing or shortness of breath. Rescue inhaler 09/12/15   Bensimhon, Shaune Pascal, MD  bisoprolol (ZEBETA) 5 MG tablet Take 0.5 tablets (2.5 mg total) by mouth at bedtime. 05/25/18   Bensimhon, Shaune Pascal, MD  calcium carbonate (OSCAL) 1500 (600 Ca) MG TABS tablet Take 600 mg of elemental calcium by mouth 3 (three) times daily with meals.    [provider]  cetirizine (ZYRTEC) 10 MG tablet Take 10 mg by mouth daily as  needed for allergies.     [provider]  HYDROcodone-acetaminophen (NORCO/VICODIN) 5-325 MG tablet Take 1-2 tablets by mouth every 4 (four) hours as needed. 10/23/19   Volanda Napoleon, PA-C  Multiple Vitamin (MULTIVITAMIN WITH MINERALS) TABS tablet Take 1 tablet by mouth daily.    [provider]  potassium chloride SA (KLOR-CON) 20 MEQ tablet Take 2 tablets (40 mEq total) by mouth 2 (two) times daily. 07/18/19   Bensimhon, Shaune Pascal, MD  predniSONE (DELTASONE) 20 MG tablet Take 2 tablets (40 mg total) by mouth daily for 4 days. 10/23/19 10/27/19  Volanda Napoleon, PA-C  sacubitril-valsartan (ENTRESTO) 97-103 MG Take 1 tablet by mouth 2 (two) times daily. 01/18/18   Georgiana Shore, NP  spironolactone (ALDACTONE) 25 MG tablet Take 1 tablet (25 mg total) by mouth daily. 11/07/18   Bensimhon, Shaune Pascal, MD  torsemide (DEMADEX) 20 MG tablet Take 2 tablets (40 mg total) by mouth daily. 03/27/19   Bensimhon, Shaune Pascal, MD    Allergies    Percocet [oxycodone-acetaminophen] and Keflex [cephalexin]  Review of Systems   Review of Systems  Constitutional: Negative for fever.  Musculoskeletal:       Toe pain  Skin: Negative for color change.  Neurological: Negative for weakness and numbness.  All other systems reviewed and are negative.   Physical Exam Updated Vital Signs BP (!) 163/104 (BP Location: Right Arm)   Pulse 88   Temp 99.3 F (37.4 C) (Oral)   Resp 14   Ht 5' 4.25" (1.632 m)   Wt 97.5 kg   SpO2 100%   BMI 36.62 kg/m   Physical Exam Vitals and nursing note  reviewed.  Constitutional:      Appearance: She is well-developed.  HENT:     Head: Normocephalic and atraumatic.  Eyes:     General: No scleral icterus.       Right eye: No discharge.        Left eye: No discharge.     Conjunctiva/sclera: Conjunctivae normal.  Cardiovascular:     Pulses:          Dorsalis pedis pulses are 2+ on the right side and 2+ on the left side.  Pulmonary:     Effort: Pulmonary  effort is normal.  Musculoskeletal:     Comments: Tenderness palpation of the base of the right toe.  No deformity or crepitus noted.  No overlying warmth, erythema.  She has maybe some slight edema noted.  Skin:    General: Skin is warm and dry.     Comments: Good distal cap refill.  RLE is not dusky in appearance or cool to touch.  Neurological:     Mental Status: She is alert.     Comments: Sensation intact along major nerve distributions of BLE  Psychiatric:        Speech: Speech normal.        Behavior: Behavior normal.     ED Results / Procedures / Treatments   Labs (all labs ordered are listed, but only abnormal results are displayed) Labs Reviewed  PREGNANCY, URINE    EKG None  Radiology DG Toe Great Right  Result Date: 10/23/2019 CLINICAL DATA:  Right first toe pain, no known injury, initial encounter EXAM: RIGHT GREAT TOE COMPARISON:  None. FINDINGS: Mild soft tissue swelling is noted. No acute fracture or dislocation is seen. No radiopaque foreign body is noted. IMPRESSION: Soft tissue swelling without acute bony abnormality. Electronically Signed   By: Inez Catalina M.D.   On: 10/23/2019 19:54    Procedures Procedures (including critical care time)  Medications Ordered in ED Medications  HYDROcodone-acetaminophen (NORCO/VICODIN) 5-325 MG per tablet 2 tablet (2 tablets Oral Given 10/23/19 2128)  predniSONE (DELTASONE) tablet 60 mg (60 mg Oral Given 10/23/19 2128)    ED Course  I have reviewed the triage vital signs and the nursing notes.  Pertinent labs & imaging results that were available during my care of the patient were reviewed by me and considered in my medical decision making (see chart for details).    MDM Rules/Calculators/A&P                       44 year old female who presents for evaluation of right first toe pain x3 days.  No preceding trauma, injury, fall.  No overlying warmth, erythema.  No fevers, numbness/weakness.  Does not have any  history of gout but does report that she has had exquisite tenderness over the last 3 days.  She states even last night while she was sleeping the sheet bothered her toe and made it hurt.  On initial ED arrival, she is afebrile, nontoxic-appearing.  Vital signs are stable.  Patient with good distal cap refill, good pulses.  She does have tenderness noted to base the toe but no overlying warmth, erythema that would be concerning for infectious etiology.  History/physical exam concerning for septic arthritis, ischemic limb.  Question if this is strain versus new onset gout.  We will plan for x-ray to ensure that there is no acute bony abnormality.  X-ray reviewed.  There is soft tissue swelling but no acute bony abnormality.  I discussed results with patient.  Given the exquisite tenderness that she has, will plan to treat as possible gout.  We will send her home with a short course of prednisone and pain medication.  Patient is allergic to Percocet but she has tolerated hydrocodone in the past.  I reviewed her records she had multiple episodes of hydrocodone in 2018.  Encouraged at home supportive care measures.  Instructed her to follow-up with her primary care doctor as directed. At this time, patient exhibits no emergent life-threatening condition that require further evaluation in ED or admission. Patient had ample opportunity for questions and discussion. All patient's questions were answered with full understanding. Strict return precautions discussed. Patient expresses understanding and agreement to plan.   Portions of this note were generated with [employer]. Dictation errors may occur despite best attempts at proofreading.  Final Clinical Impression(s) / ED Diagnoses Final diagnoses:  Pain of toe of right foot    Rx / DC Orders ED Discharge Orders         Ordered    predniSONE (DELTASONE) 20 MG tablet  Daily     10/23/19 2132    HYDROcodone-acetaminophen (NORCO/VICODIN) 5-325  MG tablet  Every 4 hours PRN     10/23/19 2132           Volanda Napoleon, PA-C 10/23/19 2247    Maudie Flakes, MD 10/24/19 1630

## 2019-10-23 NOTE — ED Triage Notes (Signed)
Pt reports right great toe pain-'tingling' x 2 days. No warmth noted. Swelling noted. Denies known injury

## 2019-10-23 NOTE — Discharge Instructions (Signed)
You can take Tylenol or Ibuprofen as directed for pain. You can alternate Tylenol and Ibuprofen every 4 hours. If you take Tylenol at 1pm, then you can take Ibuprofen at 5pm. Then you can take Tylenol again at 9pm.   Take pain medications as directed for break through pain. Do not drive or operate machinery while taking this medication.   Take prednisone as directed.   With your primary care doctor.  Return the emergency room for any worsening pain, redness or swelling that began to spread up the leg, fevers, numbness/weakness or any other worsening or concerning symptoms.

## 2019-11-07 ENCOUNTER — Encounter (HOSPITAL_COMMUNITY): Payer: Self-pay

## 2019-11-07 ENCOUNTER — Ambulatory Visit (HOSPITAL_COMMUNITY)
Admission: RE | Admit: 2019-11-07 | Discharge: 2019-11-07 | Disposition: A | Discharge status: Home / Self Care | Payer: Self-pay | Source: Ambulatory Visit | Attending: Cardiology | Admitting: Cardiology

## 2019-11-07 ENCOUNTER — Other Ambulatory Visit: Payer: Self-pay

## 2019-11-07 VITALS — BP 162/104 | HR 82 | Wt 219.4 lb

## 2019-11-07 DIAGNOSIS — R5383 Other fatigue: Secondary | ICD-10-CM | POA: Insufficient documentation

## 2019-11-07 DIAGNOSIS — Z79899 Other long term (current) drug therapy: Secondary | ICD-10-CM | POA: Insufficient documentation

## 2019-11-07 DIAGNOSIS — I10 Essential (primary) hypertension: Secondary | ICD-10-CM

## 2019-11-07 DIAGNOSIS — Z95810 Presence of automatic (implantable) cardiac defibrillator: Secondary | ICD-10-CM | POA: Insufficient documentation

## 2019-11-07 DIAGNOSIS — I428 Other cardiomyopathies: Secondary | ICD-10-CM | POA: Insufficient documentation

## 2019-11-07 DIAGNOSIS — M79674 Pain in right toe(s): Secondary | ICD-10-CM | POA: Insufficient documentation

## 2019-11-07 DIAGNOSIS — Z6837 Body mass index (BMI) 37.0-37.9, adult: Secondary | ICD-10-CM | POA: Insufficient documentation

## 2019-11-07 DIAGNOSIS — I5042 Chronic combined systolic (congestive) and diastolic (congestive) heart failure: Secondary | ICD-10-CM | POA: Insufficient documentation

## 2019-11-07 DIAGNOSIS — I110 Hypertensive heart disease with heart failure: Secondary | ICD-10-CM | POA: Insufficient documentation

## 2019-11-07 DIAGNOSIS — I4891 Unspecified atrial fibrillation: Secondary | ICD-10-CM | POA: Insufficient documentation

## 2019-11-07 DIAGNOSIS — E669 Obesity, unspecified: Secondary | ICD-10-CM | POA: Insufficient documentation

## 2019-11-07 DIAGNOSIS — R5382 Chronic fatigue, unspecified: Secondary | ICD-10-CM

## 2019-11-07 DIAGNOSIS — Z8249 Family history of ischemic heart disease and other diseases of the circulatory system: Secondary | ICD-10-CM | POA: Insufficient documentation

## 2019-11-07 DIAGNOSIS — I5022 Chronic systolic (congestive) heart failure: Secondary | ICD-10-CM

## 2019-11-07 DIAGNOSIS — Z9884 Bariatric surgery status: Secondary | ICD-10-CM | POA: Insufficient documentation

## 2019-11-07 DIAGNOSIS — J45909 Unspecified asthma, uncomplicated: Secondary | ICD-10-CM | POA: Insufficient documentation

## 2019-11-07 DIAGNOSIS — Z833 Family history of diabetes mellitus: Secondary | ICD-10-CM | POA: Insufficient documentation

## 2019-11-07 DIAGNOSIS — G4733 Obstructive sleep apnea (adult) (pediatric): Secondary | ICD-10-CM | POA: Insufficient documentation

## 2019-11-07 LAB — CBC
HCT: 36.8 % (ref 36.0–46.0)
Hemoglobin: 10.2 g/dL — ABNORMAL LOW (ref 12.0–15.0)
MCH: 21.1 pg — ABNORMAL LOW (ref 26.0–34.0)
MCHC: 27.7 g/dL — ABNORMAL LOW (ref 30.0–36.0)
MCV: 76.2 fL — ABNORMAL LOW (ref 80.0–100.0)
Platelets: 330 10*3/uL (ref 150–400)
RBC: 4.83 MIL/uL (ref 3.87–5.11)
RDW: 19.3 % — ABNORMAL HIGH (ref 11.5–15.5)
WBC: 6.5 10*3/uL (ref 4.0–10.5)
nRBC: 0 % (ref 0.0–0.2)

## 2019-11-07 LAB — BASIC METABOLIC PANEL
Anion gap: 11 (ref 5–15)
BUN: 19 mg/dL (ref 6–20)
CO2: 29 mmol/L (ref 22–32)
Calcium: 8.6 mg/dL — ABNORMAL LOW (ref 8.9–10.3)
Chloride: 103 mmol/L (ref 98–111)
Creatinine, Ser: 1.3 mg/dL — ABNORMAL HIGH (ref 0.44–1.00)
GFR calc Af Amer: 57 mL/min — ABNORMAL LOW (ref >60)
GFR calc non Af Amer: 50 mL/min — ABNORMAL LOW (ref >60)
Glucose, Bld: 111 mg/dL — ABNORMAL HIGH (ref 70–99)
Potassium: 3 mmol/L — ABNORMAL LOW (ref 3.5–5.1)
Sodium: 143 mmol/L (ref 135–145)

## 2019-11-07 LAB — T4, FREE: Free T4: 1.23 ng/dL — ABNORMAL HIGH (ref 0.61–1.12)

## 2019-11-07 LAB — TSH: TSH: 1.179 u[IU]/mL (ref 0.350–4.500)

## 2019-11-07 MED ORDER — BIDIL 20-37.5 MG PO TABS: 0.5000 | ORAL_TABLET | Freq: Three times a day (TID) | ORAL | 3 refills | Status: DC

## 2019-11-07 MED ORDER — COLCHICINE 0.6 MG PO TABS: 0.6000 mg | ORAL_TABLET | Freq: Every day | ORAL | 3 refills | Status: DC

## 2019-11-07 NOTE — Progress Notes (Signed)
Patient ID: [name], female   DOB: Nov 14, 1975, 44 y.o.   MRN: [id number]    Advanced Heart Failure Clinic Note   PCP: Dr. Heath Gold  OB:  Dr. Leo Grosser  EP: Dr. Lovena Le CHF: [place]  HPI: [name] is a 44 y.o. female with h/o obesity, chronic systolic heart failure due to nonischemic cardiomyopathy (cath 2007. No CAD) s/p Medtronic ICD implantation - now with recovered EF, asthma/chronic bronchitis and severe hypertension.   Seen in ED 05/04/16 was having atypical chest pain and jaw pain. Thought to be mildly volume overloaded.   Admitted 11/24/16-11/26/16 for Laparoscopic Sleeve Gastrectomy with hiatal hernia repair, upper endoscopy. Discharge weight was 251 pounds.   Admitted 12/22 - 12/24 with hypotension and viral URI. + Flu A. Improved with IV fluids. No chronic med changes  01/2017 EF 60-65%   04/2018 EF 25-30% (in setting of stopping meds). Meds were resumed. Echo repeated 09/2018 and EF improved to 45-50%.   Returns to clinic for f/u. Not seen in over a year. Last visit was 1/20.   She reports symptoms of fatigue and weight gain. SOB w/ physical activity. No symptoms at rest. NYHA II-III symptoms. Reports full med compliance but BP has been poorly controlled. States she is watching her sodium intake. She is euvolemic by Optivol analysis. No longer using CPAP. Reports OSA resolved w/ weight loss post gastric surgery, but she has since gained wt over the last year.   She also complains of pain in rt great toe. Initially went to urgent care and was  empirically placed on short course of prednisone with little improvement. Seen by PCP last week at [facility] and was told her Uric acid was mildly elevated at 6.3 and given another 5 day course of prednisone. Despite treatment, continues w/ pain.      Optivol - No VT/AF. Activity ~ 3 hrs/day. Euvolemic by Optivol. Personally reviewed    ECHOs 04/27/2011 45-50%.   08/20/11 ECHO 55-60%   05/19/12 EF ~40%   07/01/12 EF  ~40%   08/02/12 EF~40%    2/14 EF ~50%                          5/14 EF ~35%                          1/16 EF 25-30%                           3/17 EF 55-60% Grade I DD    01/2017 EF 60-65%                          04/2018 EF 25-30% (in setting of stopping meds)    09/2018  EF 45-50%                          CPX 01/2014: Resting HR 75, Peak HR 121 Peak VO2 15.2, 74.4% predicted VE/VCO2 slope: 26.4 OUES: 1.93 Peak RER 1.15 VE/MVV 61.7%  SH: Married. Lives in [place], 2 children. Working at [employer]. No ETOH or smoking  FH: Mother living: DM2, HTN, no CAD        Father living: DM2, HTN  Review of systems complete and found to be negative unless listed in HPI.   Past Medical History:  Diagnosis Date  .  Acute bronchitis and bronchiolitis   . Anemia    Iron supplements in the past  . Asthma    Inhaler prn;triggered by seasonal changes mostly cold weathert;last asthma  attack years ago  . Atrial fibrillation ([place])   . CHF (congestive heart failure) ([place])   . Chronic systolic CHF (congestive heart failure), NYHA class 2 ([place]) 01/2006   Has MDT Defibrillator, EF had improved by 2017 echo  . Hypertension   . Hypertension   . Infection    UTI;not frequent;last infection was in June  . Infection    Yeast;not frequent  . Migraines    Can't take Procardia XL, causes migraines  . Other primary cardiomyopathies   . Paroxysmal ventricular tachycardia ([place])   . Varicose veins 1998   Developed during last pregnancy   Current Outpatient Medications  Medication Sig Dispense Refill  . albuterol (PROVENTIL HFA;VENTOLIN HFA) 108 (90 Base) MCG/ACT inhaler Inhale 2 puffs into the lungs every 6 (six) hours as needed for wheezing or shortness of breath. Rescue inhaler 1 Inhaler 1  . bisoprolol (ZEBETA) 5 MG tablet Take 0.5 tablets (2.5 mg total) by mouth at bedtime. 15 tablet 6  . cetirizine (ZYRTEC) 10 MG tablet Take 10 mg by mouth daily as needed for allergies.     . Multiple Vitamin  (MULTIVITAMIN WITH MINERALS) TABS tablet Take 1 tablet by mouth daily.    . potassium chloride SA (KLOR-CON) 20 MEQ tablet Take 20 mEq by mouth 2 (two) times daily.    . sacubitril-valsartan (ENTRESTO) 97-103 MG Take 1 tablet by mouth 2 (two) times daily. 180 tablet 3  . spironolactone (ALDACTONE) 25 MG tablet Take 1 tablet (25 mg total) by mouth daily. 90 tablet 3  . torsemide (DEMADEX) 20 MG tablet Take 2 tablets (40 mg total) by mouth daily. 180 tablet 3  . calcium carbonate (OSCAL) 1500 (600 Ca) MG TABS tablet Take 600 mg of elemental calcium by mouth 3 (three) times daily with meals.    Marland Kitchen HYDROcodone-acetaminophen (NORCO/VICODIN) 5-325 MG tablet Take 1-2 tablets by mouth every 4 (four) hours as needed. (Patient not taking: Reported on 11/07/2019) 10 tablet 0   No current facility-administered medications for this encounter.   Vitals:   11/07/19 0841  BP: (!) 162/104  Pulse: 82  SpO2: 97%  Weight: 99.5 kg (219 lb 6.4 oz)     Wt Readings from Last 3 Encounters:  11/07/19 99.5 kg (219 lb 6.4 oz)  10/23/19 97.5 kg (215 lb)  08/01/18 98.1 kg (216 lb 3.2 oz)    PHYSICAL EXAM: General:  Well appearing, obese AAF. No respiratory difficulty HEENT: normal Neck: supple. no JVD. Carotids 2+ bilat; no bruits. No lymphadenopathy or thyromegaly appreciated. Cor: PMI nondisplaced. Regular rate & rhythm. No rubs, gallops or murmurs. Lungs: clear Abdomen: soft, nontender, nondistended. No hepatosplenomegaly. No bruits or masses. Good bowel sounds. Extremities: no cyanosis, clubbing, rash, edema, rt great toe red and mildly edematous, tender to touch  Neuro: alert & oriented x 3, cranial nerves grossly intact. moves all 4 extremities w/o difficulty. Affect pleasant.   ASSESSMENT & PLAN:  1) Chronic combined systolic/diastolic heart failure with recovered EF - NICM s/p ICD likely r/t HTN, EF in 2016 25-30%. Cath 8/16 showed normal cors - ECHO 01/2017 EF 55-60% - Echo 10-19 EF 25-30% in setting  of medication noncompliance - Echo 09/2018, EF 45-50% - NYHA Class II-III  - Has ICD. Euvolemic by Optivol. No VT/VF on interrogation   - Continue Entresto 97/103  bid, spiro 25 daily, bisoprolol 2.5  - Continue torsemide 40 mg daily. Can take extra 20 mg as needed. - Add Bidil 1/2 tablet tid  - Repeat echo to reassess EF given change in functional status/ increased fatigue - Check thyroid function test - May need repeat sleep study. No longer using CPAP (claims her OSA resolved w/ wt loss). - BMET today - 2) OSA  - Resolved with weight loss, per pt report - with new wt gain and fatigue, ? If she may need to resume CPAP - may need repeat sleep study in near future   3) HTN - Uncontrolled - Add Bidil 1/2 tablet tid - Continue Entresto 97-103 bid - Continue Spiro 25 mg daily  - Continue Torsemide 40 mg daily  - Continue Bisoprolol 2.5 mg daily  - Low sodium diet advised   4) Obesity: - S/p laparoscopic sleeve gastrectomy with hiatal hernia repair, upper endoscopy.  - Body mass index is 37.37 kg/m.   5. Fatigue - may be 2/2 worsening HF. Will update echo  - Check TFTs given associated wt gain  - may be related to OSA. May need to restart CPAP (see above) - check CBC to r/o anemia   6. Rt Great Toe Pain - description of pain c/w gout, in the setting of loop diuertics  - recent uric acid was 6.3. no improvement w/ prednisone - will do trial of colchcine 0.6 mg bid x 3 days followed by 0.6 mg daily  - once acute attack resolves, consider adding allopurinol.  - she will f/u w/ PCP for ongoing management   F/u in 3-4 weeks after echo    [employer], PA-C  9:03 AM

## 2019-11-07 NOTE — Patient Instructions (Signed)
Start Colchicine 0.6 mg Twice daily FOR 3 DAYS ONLY, then take 1 tab daily  Start Bidil 1/2 tab Three times a day   Labs done today, your results will be available in MyChart, we will contact you for abnormal readings.  Your physician recommends that you schedule a follow-up appointment in: 4 weeks with echocardiogram  If you have any questions or concerns before your next appointment please send [place] a message through [place] or call our office at [phone].  At the [facility], you and your health needs are our priority. As part of our continuing mission to provide you with exceptional heart care, we have created designated Provider Care Teams. These Care Teams include your primary Cardiologist (physician) and Advanced Practice Providers (APPs- Physician Assistants and Nurse Practitioners) who all work together to provide you with the care you need, when you need it.   You may see any of the following providers on your designated Care Team at your next follow up: Marland Kitchen Dr Glori Bickers . Dr Loralie Champagne . Darrick Grinder, NP . Lyda Jester, PA . Audry Riles, PharmD   Please be sure to bring in all your medications bottles to every appointment.

## 2019-11-08 LAB — T3, FREE: T3, Free: 3 pg/mL (ref 2.0–4.4)

## 2019-11-10 ENCOUNTER — Other Ambulatory Visit (HOSPITAL_COMMUNITY): Payer: Self-pay | Admitting: Internal Medicine

## 2019-11-10 ENCOUNTER — Telehealth (HOSPITAL_COMMUNITY): Payer: Self-pay | Admitting: Cardiology

## 2019-11-10 MED ORDER — BISOPROLOL FUMARATE 5 MG PO TABS: 2.5000 mg | ORAL_TABLET | Freq: Every day | ORAL | 6 refills | Status: DC

## 2019-11-10 MED ORDER — POTASSIUM CHLORIDE CRYS ER 20 MEQ PO TBCR: 30.0000 meq | EXTENDED_RELEASE_TABLET | Freq: Two times a day (BID) | ORAL | 6 refills | Status: DC

## 2019-11-10 MED ORDER — TORSEMIDE 20 MG PO TABS: 40.0000 mg | ORAL_TABLET | Freq: Every day | ORAL | 6 refills | Status: DC

## 2019-11-10 MED ORDER — HYDRALAZINE HCL 25 MG PO TABS: 25.0000 mg | ORAL_TABLET | Freq: Three times a day (TID) | ORAL | 6 refills | Status: DC

## 2019-11-10 MED ORDER — SPIRONOLACTONE 25 MG PO TABS: 25.0000 mg | ORAL_TABLET | Freq: Every day | ORAL | 6 refills | Status: DC

## 2019-11-10 MED ORDER — ISOSORBIDE MONONITRATE ER 30 MG PO TB24: 30.0000 mg | ORAL_TABLET | Freq: Every day | ORAL | 6 refills | Status: DC

## 2019-11-10 MED ORDER — POTASSIUM CHLORIDE CRYS ER 20 MEQ PO TBCR: 30.0000 meq | EXTENDED_RELEASE_TABLET | Freq: Two times a day (BID) | ORAL | 3 refills | Status: DC

## 2019-11-10 MED FILL — BISOPROLOL FUMARATE 5 MG TA: 5 | 30 days supply | Qty: 15 | Fill #0

## 2019-11-10 MED FILL — ISOSORBIDE MN ER 30 MG TAB: 30 | 30 days supply | Qty: 30 | Fill #0

## 2019-11-10 MED FILL — TORSEMIDE 20 MG TABLET: 20 | 30 days supply | Qty: 60 | Fill #0

## 2019-11-10 MED FILL — hydrALAZINE HCL 25 MG TABS: 25 | 30 days supply | Qty: 90 | Fill #0

## 2019-11-10 MED FILL — POTASSIUM CHLORIDE CRYS ER: 20 | 30 days supply | Qty: 90 | Fill #0

## 2019-11-10 MED FILL — SPIRONOLACTONE 25 MG TABS: 25 | 30 days supply | Qty: 30 | Fill #0

## 2019-11-10 NOTE — Telephone Encounter (Signed)
Received message that patient was having difficulties affording her Bidil and colchicine. Reached out to patient and she informed me that she recently lost her prescription insurance. I informed her that the best course of action would be to transfer her heart failure prescriptions to the [facility] and use the [place] so those medications can be obtained for free. Additionally, we will need to apply for manufacturer's assistance for her Entresto. I will work on completing application and leave patient portion at the front desk for her to sign. She can use GoodRx to help with colchicine copay. Patient expressed understanding.   Audry Riles, PharmD, BCPS, BCCP, CPP Heart Failure Clinic Pharmacist [phone]

## 2019-11-10 NOTE — Telephone Encounter (Signed)
-----   Message from Consuelo Pandy, [place] sent at 11/07/2019  5:57 PM EDT ----- K low. Check compliance w/ spironolactone and KCl.  If complaint, increase KCl to 30 meq bid.   She has mild anemia and free t4 is mildly elevated but normal TSH. We can recheck CBC and iron studies at next f/u visit. Will also follow thyroid function test. No urgent treatment needed for this now.

## 2019-11-10 NOTE — Telephone Encounter (Signed)
Pt reports compliance with sprio and KCL Will increase to 30 meq BID  During call patient expressed co pays for colchicine and bidil are expensive-will forward to pharmacy for review/assistance   Kerry Dory, CMA  11/09/2019 4:19 PM EDT    [phone] Jerilynn Mages) LMOM

## 2019-11-27 ENCOUNTER — Ambulatory Visit (HOSPITAL_BASED_OUTPATIENT_CLINIC_OR_DEPARTMENT_OTHER)
Admission: RE | Admit: 2019-11-27 | Discharge: 2019-11-27 | Disposition: A | Discharge status: Home / Self Care | Payer: Self-pay | Source: Ambulatory Visit | Attending: Cardiology | Admitting: Cardiology

## 2019-11-27 ENCOUNTER — Encounter (HOSPITAL_COMMUNITY): Payer: Self-pay | Admitting: Internal Medicine

## 2019-11-27 ENCOUNTER — Ambulatory Visit (HOSPITAL_COMMUNITY)
Admission: RE | Admit: 2019-11-27 | Discharge: 2019-11-27 | Disposition: A | Discharge status: Home / Self Care | Payer: Self-pay | Source: Ambulatory Visit | Attending: Internal Medicine | Admitting: Internal Medicine

## 2019-11-27 ENCOUNTER — Other Ambulatory Visit: Payer: Self-pay

## 2019-11-27 VITALS — BP 122/84 | HR 74 | Wt 223.8 lb

## 2019-11-27 DIAGNOSIS — R5382 Chronic fatigue, unspecified: Secondary | ICD-10-CM

## 2019-11-27 DIAGNOSIS — I5022 Chronic systolic (congestive) heart failure: Secondary | ICD-10-CM

## 2019-11-27 DIAGNOSIS — G4733 Obstructive sleep apnea (adult) (pediatric): Secondary | ICD-10-CM

## 2019-11-27 DIAGNOSIS — M79674 Pain in right toe(s): Secondary | ICD-10-CM | POA: Insufficient documentation

## 2019-11-27 DIAGNOSIS — R5383 Other fatigue: Secondary | ICD-10-CM | POA: Insufficient documentation

## 2019-11-27 DIAGNOSIS — Z8249 Family history of ischemic heart disease and other diseases of the circulatory system: Secondary | ICD-10-CM | POA: Insufficient documentation

## 2019-11-27 DIAGNOSIS — E669 Obesity, unspecified: Secondary | ICD-10-CM | POA: Insufficient documentation

## 2019-11-27 DIAGNOSIS — J45909 Unspecified asthma, uncomplicated: Secondary | ICD-10-CM | POA: Insufficient documentation

## 2019-11-27 DIAGNOSIS — I4891 Unspecified atrial fibrillation: Secondary | ICD-10-CM | POA: Insufficient documentation

## 2019-11-27 DIAGNOSIS — E876 Hypokalemia: Secondary | ICD-10-CM | POA: Insufficient documentation

## 2019-11-27 DIAGNOSIS — Z87440 Personal history of urinary (tract) infections: Secondary | ICD-10-CM | POA: Insufficient documentation

## 2019-11-27 DIAGNOSIS — I10 Essential (primary) hypertension: Secondary | ICD-10-CM

## 2019-11-27 DIAGNOSIS — Z95810 Presence of automatic (implantable) cardiac defibrillator: Secondary | ICD-10-CM | POA: Insufficient documentation

## 2019-11-27 DIAGNOSIS — I5042 Chronic combined systolic (congestive) and diastolic (congestive) heart failure: Secondary | ICD-10-CM | POA: Insufficient documentation

## 2019-11-27 DIAGNOSIS — Z6838 Body mass index (BMI) 38.0-38.9, adult: Secondary | ICD-10-CM | POA: Insufficient documentation

## 2019-11-27 DIAGNOSIS — I110 Hypertensive heart disease with heart failure: Secondary | ICD-10-CM | POA: Insufficient documentation

## 2019-11-27 DIAGNOSIS — I5032 Chronic diastolic (congestive) heart failure: Secondary | ICD-10-CM

## 2019-11-27 DIAGNOSIS — Z9884 Bariatric surgery status: Secondary | ICD-10-CM | POA: Insufficient documentation

## 2019-11-27 DIAGNOSIS — Z79899 Other long term (current) drug therapy: Secondary | ICD-10-CM | POA: Insufficient documentation

## 2019-11-27 DIAGNOSIS — I428 Other cardiomyopathies: Secondary | ICD-10-CM | POA: Insufficient documentation

## 2019-11-27 DIAGNOSIS — Z833 Family history of diabetes mellitus: Secondary | ICD-10-CM | POA: Insufficient documentation

## 2019-11-27 LAB — BASIC METABOLIC PANEL
Anion gap: 7 (ref 5–15)
BUN: 19 mg/dL (ref 6–20)
CO2: 26 mmol/L (ref 22–32)
Calcium: 8.7 mg/dL — ABNORMAL LOW (ref 8.9–10.3)
Chloride: 109 mmol/L (ref 98–111)
Creatinine, Ser: 1.1 mg/dL — ABNORMAL HIGH (ref 0.44–1.00)
GFR calc Af Amer: >60 mL/min (ref >60)
GFR calc non Af Amer: >60 mL/min (ref >60)
Glucose, Bld: 102 mg/dL — ABNORMAL HIGH (ref 70–99)
Potassium: 4.3 mmol/L (ref 3.5–5.1)
Sodium: 142 mmol/L (ref 135–145)

## 2019-11-27 MED ORDER — COLCHICINE 0.6 MG PO TABS
0.6000 mg | ORAL_TABLET | Freq: Two times a day (BID) | ORAL | 0 refills | Status: DC
Start: 2019-11-27 — End: 2020-02-23

## 2019-11-27 NOTE — Addendum Note (Signed)
Encounter addended by: Jolaine Artist, MD on: 11/27/2019 9:40 AM  Actions taken: Charge Capture section accepted

## 2019-11-27 NOTE — Progress Notes (Signed)
Order, OV note, stop bang and demographics all faxed to [employer] at [phone]  

## 2019-11-27 NOTE — Progress Notes (Addendum)
Patient Name: [name]        DOB: 1976-04-18      Height: 5'4    Weight: 223 lb  Office Name: Advanced Heart Failure         Referring Provider: Glori Bickers  Today's Date: 11/27/2019 Date:   STOP BANG RISK ASSESSMENT S (snore) Have you been told that you snore?     YES   T (tired) Are you often tired, fatigued, or sleepy during the day?   YES  O (obstruction) Do you stop breathing, choke, or gasp during sleep? NO   P (pressure) Do you have or are you being treated for high blood pressure? YES   B (BMI) Is your body index greater than 35 kg/m? YES   A (age) Are you 44 years old or older? NO   N (neck) Do you have a neck circumference greater than 16 inches?   NO   G (gender) Are you a female? NO   TOTAL STOP/BANG "YES" ANSWERS 4                                                                       For Office Use Only              Procedure Order Form    YES to 3+ Stop Bang questions OR two clinical symptoms - patient qualifies for WatchPAT (CPT 95800)     Submit: This Form + Patient Face Sheet + Clinical Note via Cloud[employer] or Fax: [phone]

## 2019-11-27 NOTE — Patient Instructions (Addendum)
TAKE Colchicine 0.6mg  (1 tab) twice a day for 3 DAYS   Labs today We will only contact you if something comes back abnormal or we need to make some changes. Otherwise no news is good news!  Your provider has recommended that you have a home sleep study.  [employer] is the company that does these test.  They will contact you by phone and must speak with you before they can ship the equipment.  Once they have spoken with you they will send the equipment right to your home with instructions on how to set it up.  Once you have completed the test you just dispose of the equipment, the information is automatically uploaded to [place] via blue-tooth technology.  IF you have any questions or issues with the equipment please call the company directly at [phone].  If your test is positive for sleep apnea and you need a home CPAP machine you will be contacted by Dr Theodosia Blender office [facility]) to set this up.  Your physician wants you to follow-up in: 6 months. You will receive a reminder letter in the mail two months in advance. If you don't receive a letter, please call our office to schedule the follow-up appointment.  Please call office at [phone] option 2 if you have any questions or concerns.   At the [facility], you and your health needs are our priority. As part of our continuing mission to provide you with exceptional heart care, we have created designated Provider Care Teams. These Care Teams include your primary Cardiologist (physician) and Advanced Practice Providers (APPs- Physician Assistants and Nurse Practitioners) who all work together to provide you with the care you need, when you need it.   You may see any of the following providers on your designated Care Team at your next follow up: Marland Kitchen Dr Glori Bickers . Dr Loralie Champagne . Darrick Grinder, NP . Lyda Jester, PA . Audry Riles, PharmD   Please be sure to bring in all your medications bottles to every  appointment.

## 2019-11-27 NOTE — Addendum Note (Signed)
Encounter addended by: Valeda Malm, RN on: 11/27/2019 9:48 AM  Actions taken: Visit diagnoses modified, Pharmacy for encounter modified, Order list changed, Diagnosis association updated, Clinical Note Signed

## 2019-11-27 NOTE — Progress Notes (Signed)
Patient ID: [name], female   DOB: August 16, 1975, 44 y.o.   MRN: [id number]    Advanced Heart Failure Clinic Note   PCP: Dr. Heath Gold  OB:  Dr. Leo Grosser  EP: Dr. Lovena Le CHF: Feliza Diven  HPI: [name] is a 44 y.o. female with h/o obesity, chronic systolic heart failure due to nonischemic cardiomyopathy (cath 2007. No CAD) s/p Medtronic ICD implantation - now with recovered EF, asthma/chronic bronchitis and severe hypertension.   01/2017 EF 60-65%   04/2018 EF 25-30% (in setting of stopping meds). Meds were resumed. Echo repeated 09/2018 and EF improved to 45-50%.   Returns to clinic for f/u.  Seen last month and Bidil 1/2 tab tid started. Has not tolerated well. Fatigued. Denies edema, orthopnea or PND.  Still having gout pain in toe.   Echo today EF 45-50% Personally reviewed   Optivol - Fluid ok. No VT/AF.Marland Kitchen Activity ~ 3 hrs/day. Personally reviewed    ECHOs 04/27/2011 45-50%.   08/20/11 ECHO 55-60%   05/19/12 EF ~40%   07/01/12 EF ~40%   08/02/12 EF~40%    2/14 EF ~50%                          5/14 EF ~35%                          1/16 EF 25-30%                           3/17 EF 55-60% Grade I DD    01/2017 EF 60-65%                          04/2018 EF 25-30% (in setting of stopping meds)    09/2018  EF 45-50%                          CPX 01/2014: Resting HR 75, Peak HR 121 Peak VO2 15.2, 74.4% predicted VE/VCO2 slope: 26.4 OUES: 1.93 Peak RER 1.15 VE/MVV 61.7%  SH: Married. Lives in [place], 2 children. Working at [employer]. No ETOH or smoking  FH: Mother living: DM2, HTN, no CAD        Father living: DM2, HTN  Review of systems complete and found to be negative unless listed in HPI.   Past Medical History:  Diagnosis Date  . Acute bronchitis and bronchiolitis   . Anemia    Iron supplements in the past  . Asthma    Inhaler prn;triggered by seasonal changes mostly cold weathert;last asthma  attack years ago  . Atrial fibrillation ([place])   . CHF  (congestive heart failure) ([place])   . Chronic systolic CHF (congestive heart failure), NYHA class 2 ([place]) 01/2006   Has MDT Defibrillator, EF had improved by 2017 echo  . Hypertension   . Hypertension   . Infection    UTI;not frequent;last infection was in June  . Infection    Yeast;not frequent  . Migraines    Can't take Procardia XL, causes migraines  . Other primary cardiomyopathies   . Paroxysmal ventricular tachycardia ([place])   . Varicose veins 1998   Developed during last pregnancy   Current Outpatient Medications  Medication Sig Dispense Refill  . albuterol (PROVENTIL HFA;VENTOLIN HFA) 108 (90 Base) MCG/ACT inhaler Inhale 2 puffs into the lungs every  6 (six) hours as needed for wheezing or shortness of breath. Rescue inhaler 1 Inhaler 1  . bisoprolol (ZEBETA) 5 MG tablet Take 0.5 tablets (2.5 mg total) by mouth at bedtime. 15 tablet 6  . calcium carbonate (OSCAL) 1500 (600 Ca) MG TABS tablet Take 600 mg of elemental calcium by mouth 3 (three) times daily with meals.    . cetirizine (ZYRTEC) 10 MG tablet Take 10 mg by mouth daily as needed for allergies.     . ferrous sulfate 325 (65 FE) MG tablet Take 325 mg by mouth daily with breakfast.    . hydrALAZINE (APRESOLINE) 25 MG tablet Take 1 tablet (25 mg total) by mouth 3 (three) times daily. 90 tablet 6  . isosorbide mononitrate (IMDUR) 30 MG 24 hr tablet Take 1 tablet (30 mg total) by mouth daily. 30 tablet 6  . Multiple Vitamin (MULTIVITAMIN WITH MINERALS) TABS tablet Take 1 tablet by mouth daily.    . potassium chloride SA (KLOR-CON) 20 MEQ tablet Take 1.5 tablets (30 mEq total) by mouth 2 (two) times daily. 90 tablet 6  . sacubitril-valsartan (ENTRESTO) 97-103 MG Take 1 tablet by mouth 2 (two) times daily. 180 tablet 3  . spironolactone (ALDACTONE) 25 MG tablet Take 1 tablet (25 mg total) by mouth daily. 30 tablet 6  . torsemide (DEMADEX) 20 MG tablet Take 2 tablets (40 mg total) by mouth daily. 60 tablet 6   No current  facility-administered medications for this encounter.   Vitals:   11/27/19 0901  BP: 122/84  Pulse: 74  SpO2: 98%  Weight: 101.5 kg (223 lb 12.8 oz)     Wt Readings from Last 3 Encounters:  11/27/19 101.5 kg (223 lb 12.8 oz)  11/07/19 99.5 kg (219 lb 6.4 oz)  10/23/19 97.5 kg (215 lb)    PHYSICAL EXAM: General:  Well appearing. No resp difficulty HEENT: normal Neck: supple. no JVD. Carotids 2+ bilat; no bruits. No lymphadenopathy or thryomegaly appreciated. Cor: PMI nondisplaced. Regular rate & rhythm. No rubs, gallops or murmurs. Lungs: clear Abdomen: obese soft, nontender, nondistended. No hepatosplenomegaly. No bruits or masses. Good bowel sounds. Extremities: no cyanosis, clubbing, rash, edema mild tenderness on palpation of lateral aspect of R 1st MCP Neuro: alert & orientedx3, cranial nerves grossly intact. moves all 4 extremities w/o difficulty. Affect pleasant  NSR 69 No ST-T wave abnormalities. Personally reviewed   ASSESSMENT & PLAN:  1) Chronic combined systolic/diastolic heart failure with recovered EF - NICM s/p ICD likely r/t HTN, EF in 2016 25-30%. Cath 8/16 showed normal cors - ECHO 01/2017 EF 55-60% - Echo 10-19 EF 25-30% in setting of medication noncompliance - Echo 09/2018, EF 45-50% - Echo today EF 45-50% Personally reviewed - NYHA Class II - Has ICD. Interrogation as above - Continue Entresto 97/103 bid, spiro 25 daily, bisoprolol 2.5  - Continue torsemide 40 mg daily. Can take extra 20 mg as needed. - Continue hydralazine/Imdure for now. If fatigue persists and sleep study is normal can consider stopping.  - Will repeat sleep study. No longer using CPAP (claims her OSA resolved w/ wt loss). - BMET today  2) OSA  - Resolved with weight loss, per pt report - with new wt gain and fatigue, ? If she may need to resume CPAP - may need repeat sleep study in near future   3) HTN - Improved control with hydral/nitrates. Will continue - Continue Entresto  97-103 bid - Continue Spiro 25 mg daily  - Continue Torsemide 40 mg  daily  - Continue Bisoprolol 2.5 mg daily. Can increase as tolerated - Low sodium diet advised   4) Obesity: - S/p laparoscopic sleeve gastrectomy with hiatal hernia repair, upper endoscopy.  - Body mass index is 38.12 kg/m.   5. Fatigue - Check sleep study   6. Rt Great Toe Pain - description of pain c/w gout, in the setting of loop diuretics. However on exam may be more like a bunion - recent uric acid was 6.3. no improvement w/ prednisone - try colchicine 0.6 bid x 3 days - she will f/u w/ PCP +/- podiatriast for ongoing management   7. Hypokalemia - recheck BMET today   Glori Bickers, MD  9:27 AM

## 2019-11-27 NOTE — Addendum Note (Signed)
Encounter addended by: Valeda Malm, RN on: 11/27/2019 2:08 PM  Actions taken: Clinical Note Signed

## 2019-11-27 NOTE — Progress Notes (Signed)
  Echocardiogram 2D Echocardiogram has been performed.  [name] 11/27/2019, 8:46 AM

## 2019-11-27 NOTE — Addendum Note (Signed)
Encounter addended by: Valeda Malm, RN on: 11/27/2019 9:57 AM  Actions taken: Clinical Note Signed

## 2019-12-05 ENCOUNTER — Other Ambulatory Visit: Payer: Self-pay | Admitting: Internal Medicine

## 2019-12-11 ENCOUNTER — Ambulatory Visit (INDEPENDENT_AMBULATORY_CARE_PROVIDER_SITE_OTHER): Payer: Self-pay | Admitting: *Deleted

## 2019-12-11 DIAGNOSIS — I119 Hypertensive heart disease without heart failure: Secondary | ICD-10-CM

## 2019-12-11 DIAGNOSIS — I43 Cardiomyopathy in diseases classified elsewhere: Secondary | ICD-10-CM

## 2019-12-12 ENCOUNTER — Telehealth: Payer: Self-pay

## 2019-12-12 NOTE — Telephone Encounter (Signed)
Left message for patient to remind of missed remote transmission.  

## 2019-12-13 LAB — CUP PACEART REMOTE DEVICE CHECK
Battery Remaining Longevity: 87 mo
Battery Voltage: 2.96 V
Brady Statistic RV Percent Paced: 0.01 %
Date Time Interrogation Session: 20210519022829
HighPow Impedance: 43 Ohm
HighPow Impedance: 52 Ohm
Implantable Lead Implant Date: 20070718
Implantable Lead Location: 753860
Implantable Pulse Generator Implant Date: 20160215
Lead Channel Impedance Value: 285 Ohm
Lead Channel Impedance Value: 342 Ohm
Lead Channel Pacing Threshold Amplitude: 1.875 V
Lead Channel Pacing Threshold Pulse Width: 0.4 ms
Lead Channel Sensing Intrinsic Amplitude: 17 mV
Lead Channel Sensing Intrinsic Amplitude: 17 mV
Lead Channel Setting Pacing Amplitude: 3.5 V
Lead Channel Setting Pacing Pulse Width: 0.7 ms
Lead Channel Setting Sensing Sensitivity: 0.3 mV

## 2019-12-13 NOTE — Progress Notes (Signed)
Remote ICD transmission.   

## 2019-12-20 ENCOUNTER — Telehealth (HOSPITAL_COMMUNITY): Payer: Self-pay | Admitting: Pharmacy Technician

## 2019-12-20 NOTE — Telephone Encounter (Signed)
Called patient in reference to [employer] application. Left her a message that we need her signature in order to send the application in.  Left message. Will follow up.

## 2019-12-21 NOTE — Telephone Encounter (Signed)
Spoke with patient and emailed her Ecologist.

## 2019-12-22 NOTE — Telephone Encounter (Signed)
Sent in application. Will follow up.

## 2019-12-27 NOTE — Telephone Encounter (Signed)
Advanced Heart Failure Patient Advocate Encounter   Patient was approved to receive Entresto from [employer]  Patient ID: [id number]  Effective dates: 12/27/19 through 12/26/20  Called and left patient message.  Charlann Boxer, CPhT

## 2020-01-12 ENCOUNTER — Encounter (INDEPENDENT_AMBULATORY_CARE_PROVIDER_SITE_OTHER): Payer: Self-pay | Admitting: Cardiology

## 2020-01-12 DIAGNOSIS — G4733 Obstructive sleep apnea (adult) (pediatric): Secondary | ICD-10-CM

## 2020-01-23 ENCOUNTER — Ambulatory Visit: Payer: Self-pay

## 2020-01-23 NOTE — Procedures (Signed)
   Sleep Study Report  Patient Information Name: [name]  ID: [id number] Birth Date: [birth year]  Age: 44  Gender: Female BMI: 38.0 (W=222 lb, H=5' 4'') Study Date:01/12/2020 Referring Physician: Glori Bickers, MD  TEST DESCRIPTION: Home sleep apnea testing was completed using the WatchPat, a Type 1 device, utilizing  peripheral arterial tonometry (PAT), chest movement, actigraphy, pulse oximetry, pulse rate, body position and snore.  AHI was calculated with apnea and hypopnea using valid sleep time as the denominator. RDI includes apneas,  hypopneas, and RERAs. The data acquired and the scoring of sleep and all associated events were performed in  accordance with the recommended standards and specifications as outlined in the AASM Manual for the Scoring of  Sleep and Associated Events 2.2.0 (2015).  FINDINGS: 1. Mild Obstructive Sleep Apnea with AHI 7.5/hr.  2. NoCentral Sleep Apnea with pAHIc 0/hr. 3. Oxygen desaturations as low as 81%. 4. Mild snoring was present. O2 sats were < 88% for 1.74minutes. 5. Total sleep time was 8 hrs and 59 min. 6. 37.8% of total sleep time was spent in REM sleep.  7. Shortened sleep onset latency at 6 min.  8. Normal REM sleep onset latency at 63 min.  9. Total awakenings were 9.   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 Clinical correlation of these findings is necessary. The decision to treat obstructive sleep apnea (OSA) is usually  based on the presence of apnea symptoms or the presence of associated medical conditions such as Hypertension,  Congestive Heart Failure, Atrial Fibrillation or Obesity. The most common symptoms of OSA are snoring, gasping for  breath while sleeping, daytime sleepiness and fatigue.   2. Initiating apnea therapy is recommended given the presence of symptoms and/or associated conditions.   Recommend proceeding with one of the following:  a. Auto-CPAP therapy with a pressure range  of 5-20cm H2O.   b. An oral appliance (OA) that can be obtained from certain dentists with expertise in sleep medicine. These are  primarily of use in non-obese patients with mild and moderate disease.   c. An ENT consultation which may be useful to look for specific causes of obstruction and possible treatment  Options.   d. If patient is intolerant to PAP therapy, consider referral to ENT for evaluation for hypoglossal nerve stimulator.   3. Close follow-up is necessary to ensure success with CPAP or oral appliance therapy for maximum benefit .  4. A follow-up oximetry study on CPAP is recommended to assess the adequacy of therapy and determine the need  for supplemental oxygen or the potential need for Bi-level therapy. An arterial blood gas to determine the adequacy of  baseline ventilation and oxygenation should also be considered.  5. Healthy sleep recommendations include: adequate nightly sleep (normal 7-9 hrs/night), avoidance of caffeine after  noon and alcohol near bedtime, and maintaining a sleep environment that is cool, dark and quiet.  6. Weight loss for overweight patients is recommended. Even modest amounts of weight loss can significantly  improve the severity of sleep apnea.  7. Snoring recommendations include: weight loss where appropriate, side sleeping, and avoidance of alcohol before  Bed.  8. Operation of motor vehicle or dangerous equipment must be avoided when feeling drowsy, excessively sleepy, or  mentally fatigued.  Report prepared by: Signature: Fransico Him, MD [facility], Diplomate ABSM Electronically Signed: Jan 23, 2020

## 2020-01-24 ENCOUNTER — Telehealth: Payer: Self-pay | Admitting: *Deleted

## 2020-01-24 NOTE — Telephone Encounter (Signed)
Informed patient of sleep study results and patient understanding was verbalized. Patient understands her sleep study showed that she has mild OSA and set up virtual visit to discuss results. 02/22/20 at 12:40.

## 2020-01-24 NOTE — Telephone Encounter (Signed)
-----   Message from Sueanne Margarita, MD sent at 01/23/2020 10:13 PM EDT ----- Please let patient know that she has mild OSA and set up virtual visit to discuss results

## 2020-01-24 NOTE — Telephone Encounter (Signed)
°  Patient Consent for Virtual Visit         [name] has provided verbal consent on 01/24/2020 for a virtual visit (video or telephone).   CONSENT FOR VIRTUAL VISIT FOR:  [name]  By participating in this virtual visit I agree to the following:  I hereby voluntarily request, consent and authorize [facility] and its employed or contracted physicians, physician assistants, nurse practitioners or other licensed health care professionals (the Practitioner), to provide me with telemedicine health care services (the Services") as deemed necessary by the treating Practitioner. I acknowledge and consent to receive the Services by the Practitioner via telemedicine. I understand that the telemedicine visit will involve communicating with the Practitioner through live audiovisual communication technology and the disclosure of certain medical information by electronic transmission. I acknowledge that I have been given the opportunity to request an in-person assessment or other available alternative prior to the telemedicine visit and am voluntarily participating in the telemedicine visit.  I understand that I have the right to withhold or withdraw my consent to the use of telemedicine in the course of my care at any time, without affecting my right to future care or treatment, and that the Practitioner or I may terminate the telemedicine visit at any time. I understand that I have the right to inspect all information obtained and/or recorded in the course of the telemedicine visit and may receive copies of available information for a reasonable fee.  I understand that some of the potential risks of receiving the Services via telemedicine include:   Delay or interruption in medical evaluation due to technological equipment failure or disruption;  Information transmitted may not be sufficient (e.g. poor resolution of images) to allow for appropriate medical decision making by the Practitioner;  and/or   In rare instances, security protocols could fail, causing a breach of personal health information.  Furthermore, I acknowledge that it is my responsibility to provide information about my medical history, conditions and care that is complete and accurate to the best of my ability. I acknowledge that Practitioner's advice, recommendations, and/or decision may be based on factors not within their control, such as incomplete or inaccurate data provided by me or distortions of diagnostic images or specimens that may result from electronic transmissions. I understand that the practice of medicine is not an exact science and that Practitioner makes no warranties or guarantees regarding treatment outcomes. I acknowledge that a copy of this consent can be made available to me via my patient portal ([name]), or I can request a printed copy by calling the office of [facility].    I understand that my insurance will be billed for this visit.   I have read or had this consent read to me.  I understand the contents of this consent, which adequately explains the benefits and risks of the Services being provided via telemedicine.   I have been provided ample opportunity to ask questions regarding this consent and the Services and have had my questions answered to my satisfaction.  I give my informed consent for the services to be provided through the use of telemedicine in my medical care

## 2020-02-22 ENCOUNTER — Telehealth: Payer: Self-pay | Admitting: Cardiology

## 2020-02-22 ENCOUNTER — Telehealth: Payer: Self-pay

## 2020-02-22 ENCOUNTER — Other Ambulatory Visit: Payer: Self-pay

## 2020-02-22 NOTE — Telephone Encounter (Signed)
Left message for patient to call back in regards to rescheduling her appointment with Dr. Radford Pax.

## 2020-02-23 ENCOUNTER — Other Ambulatory Visit: Payer: Self-pay

## 2020-02-23 ENCOUNTER — Telehealth (INDEPENDENT_AMBULATORY_CARE_PROVIDER_SITE_OTHER): Payer: Self-pay | Admitting: Cardiology

## 2020-02-23 ENCOUNTER — Telehealth: Payer: Self-pay | Admitting: *Deleted

## 2020-02-23 ENCOUNTER — Encounter: Payer: Self-pay | Admitting: Cardiology

## 2020-02-23 VITALS — BP 127/84 | HR 77 | Temp 98.2°F | Ht 64.25 in | Wt 199.0 lb

## 2020-02-23 DIAGNOSIS — G4733 Obstructive sleep apnea (adult) (pediatric): Secondary | ICD-10-CM

## 2020-02-23 DIAGNOSIS — I10 Essential (primary) hypertension: Secondary | ICD-10-CM

## 2020-02-23 NOTE — Telephone Encounter (Signed)
-----   Message from Sueanne Margarita, MD sent at 02/23/2020 10:02 AM EDT ----- Please order a ResMed CPAP on auto from 4-15cm H2o, heated humidity and mask of choice from [facility] and followup with me in 8 weeks

## 2020-02-23 NOTE — Telephone Encounter (Signed)
Order placed to [facility] via fax. 

## 2020-02-25 NOTE — Progress Notes (Signed)
Virtual Visit via Telephone Note   This visit type was conducted due to national recommendations for restrictions regarding the COVID-19 Pandemic (e.g. social distancing) in an effort to limit this patient's exposure and mitigate transmission in our community.  Due to her co-morbid illnesses, this patient is at least at moderate risk for complications without adequate follow up.  This format is felt to be most appropriate for this patient at this time.  The patient did not have access to video technology/had technical difficulties with video requiring transitioning to audio format only (telephone).  All issues noted in this document were discussed and addressed.  No physical exam could be performed with this format.  Please refer to the patient's chart for her  consent to telehealth for [facility].  Evaluation Performed:  Follow-up visit  This visit type was conducted due to national recommendations for restrictions regarding the COVID-19 Pandemic (e.g. social distancing).  This format is felt to be most appropriate for this patient at this time.  All issues noted in this document were discussed and addressed.  No physical exam was performed (except for noted visual exam findings with Video Visits).  Please refer to the patient's chart (MyChart message for video visits and phone note for telephone visits) for the patient's consent to telehealth for [facility].  Date:  02/25/2020   ID:  [name], DOB [birth year], MRN [id number]  Patient Location:  Home  Provider location:   [place]  PCP:  Maurice Small, MD  Cardiologist:  Glori Bickers, MD  Sleep Medicine:  Fransico Him, MD Electrophysiologist:  None   Chief Complaint:  OSA  History of Present Illness:    [name] is a 44 y.o. female who presents via audio/video conferencing for a telehealth visit today.    This is a 44yo obese AAF with a hx of atrial fibrillation, HTN, CHF who was referred by Dr. Haroldine Laws for  evaluation of possible OSA.  She was diagnosed with severe OSA in 2017 with an AHI of 70/hr and started on CPAP.  She then had weight loss surgery and stopped using her PAP.  She has gained back some weight and now is having problems with awakening gasping for breath and choking.  She is also snoring.    She underwent home sleep study in June showing mild OSA with an AHI of 7.5/hr and mild snoring but no significant hypoxemia.  She continues to complain of significant difficulties with sleep, frequent awakenings and choking at night as well as feeling sleepy during the day.   The patient does not have symptoms concerning for COVID-19 infection (fever, chills, cough, or new shortness of breath).    Prior CV studies:   The following studies were reviewed today:  Sleep studies from 2017 and 12/2019  Past Medical History:  Diagnosis Date  . Acute bronchitis and bronchiolitis   . Anemia    Iron supplements in the past  . Asthma    Inhaler prn;triggered by seasonal changes mostly cold weathert;last asthma  attack years ago  . Atrial fibrillation ([place])   . CHF (congestive heart failure) ([place])   . Chronic systolic CHF (congestive heart failure), NYHA class 2 ([place]) 01/2006   Has MDT Defibrillator, EF had improved by 2017 echo  . Hypertension   . Hypertension   . Infection    UTI;not frequent;last infection was in June  . Infection    Yeast;not frequent  . Migraines    Can't take Procardia XL, causes  migraines  . Other primary cardiomyopathies   . Paroxysmal ventricular tachycardia ([place])   . Varicose veins 1998   Developed during last pregnancy   Past Surgical History:  Procedure Laterality Date  . BREAST REDUCTION SURGERY  03/05/1994   d/t back, shoulder, and neck pain (42 F)  . CARDIAC CATHETERIZATION     EF 30-35%  . CARDIAC CATHETERIZATION N/A 05/04/2015   Procedure: Left Heart Cath and Coronary Angiography;  Surgeon: Belva Crome, MD;  Location: [facility] CV LAB;  Service:  Cardiovascular;  Laterality: N/A;  . CARDIAC DEFIBRILLATOR PLACEMENT    . CESAREAN SECTION  1998   d/t preeclampsia  . IMPLANTABLE CARDIOVERTER DEFIBRILLATOR (ICD) GENERATOR CHANGE N/A 09/10/2014   Procedure: ICD GENERATOR CHANGE;  Surgeon: Evans Lance, MD;  Location: [facility] CATH LAB;  Service: Cardiovascular;  Laterality: N/A;  . INSERT / REPLACE / REMOVE PACEMAKER   02/10/2006   Status post implantable cardioverter-defibrillator insertion  .Marland KitchenMarland KitchenMarland KitchenThe Medtronic Maximo VR, model 7232, single chamber  . LAPAROSCOPIC GASTRIC SLEEVE RESECTION WITH HIATAL HERNIA REPAIR N/A 11/24/2016   Procedure: LAPAROSCOPIC GASTRIC SLEEVE RESECTION WITH HIATAL HERNIA REPAIR, UPPER ENDO;  Surgeon: Greer Pickerel, MD;  Location: [facility] ORS;  Service: General;  Laterality: N/A;  . LAPAROSCOPIC OVARIAN CYSTECTOMY Bilateral 11/24/2016   Procedure: LAPAROSCOPIC BILATERAL OVARIAN CYSTECTOMY;  Surgeon: Janyth Pupa, DO;  Location: [facility] ORS;  Service: Gynecology;  Laterality: Bilateral;  . REDUCTION MAMMAPLASTY Bilateral      Current Meds  Medication Sig  . albuterol (PROVENTIL HFA;VENTOLIN HFA) 108 (90 Base) MCG/ACT inhaler Inhale 2 puffs into the lungs every 6 (six) hours as needed for wheezing or shortness of breath. Rescue inhaler  . bisoprolol (ZEBETA) 5 MG tablet Take 0.5 tablets (2.5 mg total) by mouth at bedtime.  . calcium carbonate (OSCAL) 1500 (600 Ca) MG TABS tablet Take 600 mg of elemental calcium by mouth 3 (three) times daily with meals.  . Calcium Carbonate-Vit D-Min (QC CALCIUM-MAGNESIUM-ZINC-D3 PO) Take by mouth in the morning, at noon, and at bedtime.  . cetirizine (ZYRTEC) 10 MG tablet Take 10 mg by mouth daily as needed for allergies.   . ferrous sulfate 325 (65 FE) MG tablet Take 325 mg by mouth daily with breakfast.  . hydrALAZINE (APRESOLINE) 25 MG tablet Take 1 tablet (25 mg total) by mouth 3 (three) times daily.  . isosorbide mononitrate (IMDUR) 30 MG 24 hr tablet Take 1 tablet (30 mg total) by mouth daily.  .  Multiple Vitamin (MULTIVITAMIN WITH MINERALS) TABS tablet Take 1 tablet by mouth daily.  . potassium chloride SA (KLOR-CON) 20 MEQ tablet Take 1.5 tablets (30 mEq total) by mouth 2 (two) times daily.  . sacubitril-valsartan (ENTRESTO) 97-103 MG Take 1 tablet by mouth 2 (two) times daily.  Marland Kitchen spironolactone (ALDACTONE) 25 MG tablet Take 1 tablet (25 mg total) by mouth daily.  Marland Kitchen torsemide (DEMADEX) 20 MG tablet Take 2 tablets (40 mg total) by mouth daily.     Allergies:   Percocet [oxycodone-acetaminophen], Hydrocodone-acetaminophen, and Keflex [cephalexin]   Social History   Tobacco Use  . Smoking status: Never Smoker  . Smokeless tobacco: Never Used  Substance Use Topics  . Alcohol use: No  . Drug use: No     Family Hx: The patient's family history includes Asthma in her daughter; Breast cancer in her cousin, paternal aunt, paternal aunt, paternal aunt, and another family member; Cancer in her cousin and other family members; Colon polyps in her father and mother; Diabetes in her  father, maternal aunt, paternal aunt, and paternal uncle; Heart disease in an other family member; Hypertension in her father, paternal aunt, and another family member; Kidney cancer in her cousin; Lung cancer in her maternal aunt; Lung cancer (age of onset: 75) in her maternal uncle; Lupus in her maternal aunt; Migraines in her cousin, daughter, and mother; Other in her daughter and father; Ovarian cancer in her paternal aunt; Ovarian cancer (age of onset: 90) in her paternal aunt; Prostate cancer in her maternal uncle; Rheum arthritis in her mother; Seizures in her maternal uncle; Sickle cell trait in her daughter; Ulcers in her mother; Uterine cancer in her paternal aunt and paternal grandmother. There is no history of Colon cancer.  ROS:   Please see the history of present illness.     All other systems reviewed and are negative.   Labs/Other Tests and Data Reviewed:    Recent Labs: 11/07/2019: Hemoglobin  10.2; Platelets 330; TSH 1.179 11/27/2019: BUN 19; Creatinine, Ser 1.10; Potassium 4.3; Sodium 142   Recent Lipid Panel No results found for: CHOL, TRIG, HDL, CHOLHDL, LDLCALC, LDLDIRECT  Wt Readings from Last 3 Encounters:  02/23/20 199 lb (90.3 kg)  11/27/19 223 lb 12.8 oz (101.5 kg)  11/07/19 219 lb 6.4 oz (99.5 kg)     Objective:    Vital Signs:  BP 127/84   Pulse 77   Temp 98.2 F (36.8 C)   Ht 5' 4.25" (1.632 m)   Wt 199 lb (90.3 kg)   BMI 33.89 kg/m     ASSESSMENT & PLAN:    1.  OSA -she has a hx of severe OSA in 2017 but went through weigh loss surgery and stopped her PAP -she has gained back some weight and now having excessive daytime sleepiness, awakening gasping for breath and choking and snoring -I think she would benefit from PAP therapy -start CPAP on auto from 4-18cm H2O with heated humidity and mask of choice -followup with me 8 weeks after getting new device  2.  HTN -continue Bisolprolol 2.5mg  daily, Hydralazine 25mg  TID, Entresto 97-103mg  BID, Imdur 30mg  daily and spiro 25mg  daily  3.  Obesity -I have encouraged her to get into a routine exercise program and cut back on carbs and portions.   COVID-19 Education: The signs and symptoms of COVID-19 were discussed with the patient and how to seek care for testing (follow up with PCP or arrange E-visit).  The importance of social distancing was discussed today.  Patient Risk:   After full review of this patient's clinical status, I feel that they are at least moderate risk at this time.  Time:   Today, I have spent 20 minutes on telemedicine discussing medical problems including OSA, HTN, Obesity and reviewing patient's chart including sleep study.  Medication Adjustments/Labs and Tests Ordered: Current medicines are reviewed at length with the patient today.  Concerns regarding medicines are outlined above.  Tests Ordered: No orders of the defined types were placed in this encounter.  Medication  Changes: No orders of the defined types were placed in this encounter.   Disposition:  Follow up in 8 week(s)after getting on PAP therapy  Signed, Fransico Him, MD  02/25/2020 5:12 PM    [place]

## 2020-02-27 MED FILL — POTASSIUM CHLORIDE CRYS ER: 20 | 30 days supply | Qty: 90 | Fill #2

## 2020-02-27 MED FILL — TORSEMIDE 20 MG TABLET: 20 | 30 days supply | Qty: 60 | Fill #2

## 2020-02-27 MED FILL — SPIRONOLACTONE 25 MG TABS: 25 | 30 days supply | Qty: 30 | Fill #2

## 2020-02-27 MED FILL — BISOPROLOL FUMARATE 5 MG TA: 5 | 30 days supply | Qty: 15 | Fill #2

## 2020-02-27 MED FILL — hydrALAZINE HCL 25 MG TABS: 25 | 30 days supply | Qty: 90 | Fill #2

## 2020-02-27 MED FILL — ISOSORBIDE MN ER 30 MG TAB: 30 | 30 days supply | Qty: 30 | Fill #2

## 2020-03-11 ENCOUNTER — Ambulatory Visit (INDEPENDENT_AMBULATORY_CARE_PROVIDER_SITE_OTHER): Payer: Self-pay | Admitting: *Deleted

## 2020-03-11 DIAGNOSIS — I4729 Other ventricular tachycardia: Secondary | ICD-10-CM

## 2020-03-11 DIAGNOSIS — I472 Ventricular tachycardia: Secondary | ICD-10-CM

## 2020-03-11 LAB — CUP PACEART REMOTE DEVICE CHECK
Battery Remaining Longevity: 81 mo
Battery Voltage: 2.98 V
Brady Statistic RV Percent Paced: 0.01 %
Date Time Interrogation Session: 20210816063425
HighPow Impedance: 39 Ohm
HighPow Impedance: 46 Ohm
Implantable Lead Implant Date: 20070718
Implantable Lead Location: 753860
Implantable Pulse Generator Implant Date: 20160215
Lead Channel Impedance Value: 285 Ohm
Lead Channel Impedance Value: 304 Ohm
Lead Channel Pacing Threshold Amplitude: 2.125 V
Lead Channel Pacing Threshold Pulse Width: 0.4 ms
Lead Channel Sensing Intrinsic Amplitude: 12.25 mV
Lead Channel Sensing Intrinsic Amplitude: 12.25 mV
Lead Channel Setting Pacing Amplitude: 3.5 V
Lead Channel Setting Pacing Pulse Width: 0.7 ms
Lead Channel Setting Sensing Sensitivity: 0.3 mV

## 2020-03-13 NOTE — Progress Notes (Signed)
Remote ICD transmission.   

## 2020-04-05 MED FILL — TORSEMIDE 20 MG TABLET: 20 | 30 days supply | Qty: 60 | Fill #3

## 2020-04-05 MED FILL — ISOSORBIDE MN ER 30 MG TAB: 30 | 30 days supply | Qty: 30 | Fill #3

## 2020-04-05 MED FILL — POTASSIUM CHLORIDE CRYS ER: 20 | 30 days supply | Qty: 90 | Fill #3

## 2020-04-05 MED FILL — BISOPROLOL FUMARATE 5 MG TA: 5 | 30 days supply | Qty: 15 | Fill #3

## 2020-04-05 MED FILL — SPIRONOLACTONE 25 MG TABS: 25 | 30 days supply | Qty: 30 | Fill #3

## 2020-04-05 MED FILL — hydrALAZINE HCL 25 MG TABS: 25 | 30 days supply | Qty: 90 | Fill #3

## 2020-04-12 ENCOUNTER — Telehealth (HOSPITAL_COMMUNITY): Payer: Self-pay | Admitting: *Deleted

## 2020-04-12 NOTE — Telephone Encounter (Signed)
Her heart arteries are good. So doubt it is cardiac. Agree with restarting omeprazole.

## 2020-04-12 NOTE — Telephone Encounter (Signed)
Pt aware.

## 2020-04-12 NOTE — Telephone Encounter (Signed)
Pt called to report for the last two months she has experienced chest pain that comes and goes. Pt said they have become more frequent over the last two weeks and sometimes she will have nausea and jaw pain. Pt said she is not currently having an "episode". Pt said pain lasts about 57min when it happens. She described pain as feeling like her chest was on fire but also like a weight was on her chest. Pt said it woke her out of her sleep at about 3am. Pt thought it could be acid reflux so she started taking omeprazole again. Pt said she hasnt had any changes in the last two months except she started hydralazine and Imdur.    Routed to [place] for advice

## 2020-05-01 ENCOUNTER — Other Ambulatory Visit: Payer: Self-pay | Admitting: Family Medicine

## 2020-05-01 DIAGNOSIS — R109 Unspecified abdominal pain: Secondary | ICD-10-CM

## 2020-05-10 MED FILL — TORSEMIDE 20 MG TABLET: 20 | 30 days supply | Qty: 60 | Fill #4

## 2020-05-10 MED FILL — hydrALAZINE HCL 25 MG TABS: 25 | 30 days supply | Qty: 90 | Fill #4

## 2020-05-10 MED FILL — BISOPROLOL FUMARATE 5 MG TA: 5 | 30 days supply | Qty: 15 | Fill #4

## 2020-05-10 MED FILL — POTASSIUM CHLORIDE CRYS ER: 20 | 30 days supply | Qty: 90 | Fill #4

## 2020-05-10 MED FILL — SPIRONOLACTONE 25 MG TABS: 25 | 30 days supply | Qty: 30 | Fill #4

## 2020-05-10 MED FILL — ISOSORBIDE MN ER 30 MG TAB: 30 | 30 days supply | Qty: 30 | Fill #4

## 2020-05-17 ENCOUNTER — Other Ambulatory Visit: Payer: Self-pay

## 2020-05-20 ENCOUNTER — Ambulatory Visit
Admission: RE | Admit: 2020-05-20 | Discharge: 2020-05-20 | Disposition: A | Discharge status: Home / Self Care | Payer: Managed Care, Other (non HMO) | Source: Ambulatory Visit | Attending: Family Medicine | Admitting: Family Medicine

## 2020-05-20 DIAGNOSIS — R109 Unspecified abdominal pain: Secondary | ICD-10-CM

## 2020-06-10 ENCOUNTER — Ambulatory Visit (INDEPENDENT_AMBULATORY_CARE_PROVIDER_SITE_OTHER): Payer: Managed Care, Other (non HMO)

## 2020-06-10 DIAGNOSIS — I43 Cardiomyopathy in diseases classified elsewhere: Secondary | ICD-10-CM | POA: Diagnosis not present

## 2020-06-10 DIAGNOSIS — I119 Hypertensive heart disease without heart failure: Secondary | ICD-10-CM | POA: Diagnosis not present

## 2020-06-10 LAB — CUP PACEART REMOTE DEVICE CHECK
Battery Remaining Longevity: 76 mo
Battery Voltage: 3 V
Brady Statistic RV Percent Paced: 0.01 %
Date Time Interrogation Session: 20211115031710
HighPow Impedance: 45 Ohm
HighPow Impedance: 56 Ohm
Implantable Lead Implant Date: 20070718
Implantable Lead Location: 753860
Implantable Pulse Generator Implant Date: 20160215
Lead Channel Impedance Value: 285 Ohm
Lead Channel Impedance Value: 342 Ohm
Lead Channel Pacing Threshold Amplitude: 1.625 V
Lead Channel Pacing Threshold Pulse Width: 0.4 ms
Lead Channel Sensing Intrinsic Amplitude: 20.125 mV
Lead Channel Sensing Intrinsic Amplitude: 20.125 mV
Lead Channel Setting Pacing Amplitude: 3.5 V
Lead Channel Setting Pacing Pulse Width: 0.7 ms
Lead Channel Setting Sensing Sensitivity: 0.3 mV

## 2020-06-11 MED FILL — TORSEMIDE 20 MG TABLET: 20 | 30 days supply | Qty: 60 | Fill #4

## 2020-06-11 MED FILL — SPIRONOLACTONE 25 MG TABS: 25 | 30 days supply | Qty: 30 | Fill #4

## 2020-06-12 NOTE — Progress Notes (Signed)
Remote ICD transmission.   

## 2020-06-14 ENCOUNTER — Encounter (HOSPITAL_COMMUNITY): Payer: Self-pay

## 2020-06-19 MED FILL — TORSEMIDE 20 MG TABLET: 20 | 30 days supply | Qty: 60 | Fill #5

## 2020-06-19 MED FILL — POTASSIUM CHLORIDE CRYS ER: 20 | 30 days supply | Qty: 90 | Fill #5

## 2020-06-19 MED FILL — BISOPROLOL FUMARATE 5 MG TA: 5 | 30 days supply | Qty: 15 | Fill #5

## 2020-06-19 MED FILL — SPIRONOLACTONE 25 MG TABS: 25 | 30 days supply | Qty: 30 | Fill #5

## 2020-06-19 MED FILL — hydrALAZINE HCL 25 MG TABS: 25 | 30 days supply | Qty: 90 | Fill #5

## 2020-06-19 MED FILL — ISOSORBIDE MN ER 30 MG TAB: 30 | 30 days supply | Qty: 30 | Fill #5

## 2020-08-01 MED FILL — hydrALAZINE HCL 25 MG TABS: 25 | 30 days supply | Qty: 90 | Fill #6

## 2020-08-01 MED FILL — SPIRONOLACTONE 25 MG TABS: 25 | 30 days supply | Qty: 30 | Fill #6

## 2020-08-01 MED FILL — ISOSORBIDE MN ER 30 MG TAB: 30 | 30 days supply | Qty: 30 | Fill #6

## 2020-08-01 MED FILL — POTASSIUM CHLORIDE CRYS ER: 20 | 30 days supply | Qty: 90 | Fill #6

## 2020-08-01 MED FILL — BISOPROLOL FUMARATE 5 MG TA: 5 | 30 days supply | Qty: 15 | Fill #6

## 2020-08-01 MED FILL — TORSEMIDE 20 MG TABLET: 20 | 30 days supply | Qty: 60 | Fill #6

## 2020-08-07 ENCOUNTER — Encounter (HOSPITAL_COMMUNITY): Payer: Self-pay

## 2020-09-03 ENCOUNTER — Other Ambulatory Visit (HOSPITAL_COMMUNITY): Payer: Self-pay | Admitting: Internal Medicine

## 2020-09-03 MED FILL — POTASSIUM CHLORIDE CRYS ER: 20 | 30 days supply | Qty: 90 | Fill #0

## 2020-09-03 MED FILL — ISOSORBIDE MN ER 30 MG TAB: 30 | 30 days supply | Qty: 30 | Fill #0

## 2020-09-03 MED FILL — hydrALAZINE HCL 25 MG TABS: 25 | 30 days supply | Qty: 90 | Fill #0

## 2020-09-03 MED FILL — SPIRONOLACTONE 25 MG TABS: 25 | 30 days supply | Qty: 30 | Fill #0

## 2020-09-03 MED FILL — TORSEMIDE 20 MG TABLET: 20 | 30 days supply | Qty: 60 | Fill #0

## 2020-09-03 MED FILL — BISOPROLOL FUMARATE 5 MG TA: 5 | 15 days supply | Qty: 15 | Fill #0

## 2020-09-09 ENCOUNTER — Ambulatory Visit (INDEPENDENT_AMBULATORY_CARE_PROVIDER_SITE_OTHER): Payer: Managed Care, Other (non HMO)

## 2020-09-09 DIAGNOSIS — I4729 Other ventricular tachycardia: Secondary | ICD-10-CM

## 2020-09-09 DIAGNOSIS — I472 Ventricular tachycardia: Secondary | ICD-10-CM | POA: Diagnosis not present

## 2020-09-09 LAB — CUP PACEART REMOTE DEVICE CHECK
Battery Remaining Longevity: 73 mo
Battery Voltage: 3 V
Brady Statistic RV Percent Paced: 0.01 %
Date Time Interrogation Session: 20220214033326
HighPow Impedance: 41 Ohm
HighPow Impedance: 49 Ohm
Implantable Lead Implant Date: 20070718
Implantable Lead Location: 753860
Implantable Pulse Generator Implant Date: 20160215
Lead Channel Impedance Value: 247 Ohm
Lead Channel Impedance Value: 304 Ohm
Lead Channel Pacing Threshold Amplitude: 1.875 V
Lead Channel Pacing Threshold Pulse Width: 0.4 ms
Lead Channel Sensing Intrinsic Amplitude: 13.75 mV
Lead Channel Sensing Intrinsic Amplitude: 13.75 mV
Lead Channel Setting Pacing Amplitude: 3.5 V
Lead Channel Setting Pacing Pulse Width: 0.7 ms
Lead Channel Setting Sensing Sensitivity: 0.3 mV

## 2020-09-16 NOTE — Progress Notes (Signed)
Remote ICD transmission.   

## 2020-10-17 MED FILL — SPIRONOLACTONE 25 MG TABLET: 25 | 30 days supply | Qty: 30 | Fill #1

## 2020-10-17 MED FILL — ISOSORBIDE MN ER 30 MG TAB: 30 | 30 days supply | Qty: 30 | Fill #1

## 2020-10-17 MED FILL — BISOPROLOL FUMARATE 5 MG TA: 5 | 15 days supply | Qty: 15 | Fill #1

## 2020-10-17 MED FILL — POTASSIUM CHLORIDE CRYS ER: 20 | 30 days supply | Qty: 90 | Fill #1

## 2020-10-17 MED FILL — hydrALAZINE HCL 25 MG TABS: 25 | 30 days supply | Qty: 90 | Fill #1

## 2020-10-17 MED FILL — TORSEMIDE 20 MG TABLET: 20 | 30 days supply | Qty: 60 | Fill #1

## 2020-11-05 ENCOUNTER — Other Ambulatory Visit (HOSPITAL_COMMUNITY): Payer: Self-pay | Admitting: *Deleted

## 2020-11-05 DIAGNOSIS — I5022 Chronic systolic (congestive) heart failure: Secondary | ICD-10-CM

## 2020-11-06 ENCOUNTER — Ambulatory Visit (HOSPITAL_COMMUNITY)
Admission: RE | Admit: 2020-11-06 | Discharge: 2020-11-06 | Disposition: A | Discharge status: Home / Self Care | Payer: Managed Care, Other (non HMO) | Source: Ambulatory Visit | Attending: Internal Medicine | Admitting: Internal Medicine

## 2020-11-06 ENCOUNTER — Other Ambulatory Visit: Payer: Self-pay

## 2020-11-06 DIAGNOSIS — I5022 Chronic systolic (congestive) heart failure: Secondary | ICD-10-CM | POA: Diagnosis present

## 2020-11-06 LAB — BASIC METABOLIC PANEL
Anion gap: 6 (ref 5–15)
BUN: 22 mg/dL — ABNORMAL HIGH (ref 6–20)
CO2: 27 mmol/L (ref 22–32)
Calcium: 8.8 mg/dL — ABNORMAL LOW (ref 8.9–10.3)
Chloride: 106 mmol/L (ref 98–111)
Creatinine, Ser: 1.63 mg/dL — ABNORMAL HIGH (ref 0.44–1.00)
GFR, Estimated: 39 mL/min — ABNORMAL LOW (ref >60)
Glucose, Bld: 89 mg/dL (ref 70–99)
Potassium: 4.1 mmol/L (ref 3.5–5.1)
Sodium: 139 mmol/L (ref 135–145)

## 2020-11-08 ENCOUNTER — Other Ambulatory Visit (HOSPITAL_COMMUNITY): Payer: Self-pay | Admitting: Cardiology

## 2020-11-08 DIAGNOSIS — I5032 Chronic diastolic (congestive) heart failure: Secondary | ICD-10-CM

## 2020-11-08 NOTE — Progress Notes (Signed)
ORDER FOR BMET PLACED

## 2020-11-15 ENCOUNTER — Other Ambulatory Visit: Payer: Self-pay | Admitting: General Surgery

## 2020-11-15 DIAGNOSIS — Z8719 Personal history of other diseases of the digestive system: Secondary | ICD-10-CM

## 2020-11-15 DIAGNOSIS — K219 Gastro-esophageal reflux disease without esophagitis: Secondary | ICD-10-CM

## 2020-11-21 ENCOUNTER — Ambulatory Visit (HOSPITAL_COMMUNITY)
Admission: RE | Admit: 2020-11-21 | Discharge: 2020-11-21 | Disposition: A | Discharge status: Home / Self Care | Payer: Managed Care, Other (non HMO) | Source: Ambulatory Visit | Attending: Internal Medicine | Admitting: Internal Medicine

## 2020-11-21 ENCOUNTER — Other Ambulatory Visit: Payer: Self-pay

## 2020-11-21 DIAGNOSIS — I5032 Chronic diastolic (congestive) heart failure: Secondary | ICD-10-CM | POA: Insufficient documentation

## 2020-11-21 LAB — BASIC METABOLIC PANEL
Anion gap: 7 (ref 5–15)
BUN: 23 mg/dL — ABNORMAL HIGH (ref 6–20)
CO2: 27 mmol/L (ref 22–32)
Calcium: 8.2 mg/dL — ABNORMAL LOW (ref 8.9–10.3)
Chloride: 106 mmol/L (ref 98–111)
Creatinine, Ser: 1.52 mg/dL — ABNORMAL HIGH (ref 0.44–1.00)
GFR, Estimated: 43 mL/min — ABNORMAL LOW (ref >60)
Glucose, Bld: 108 mg/dL — ABNORMAL HIGH (ref 70–99)
Potassium: 3.7 mmol/L (ref 3.5–5.1)
Sodium: 140 mmol/L (ref 135–145)

## 2020-12-03 ENCOUNTER — Encounter (HOSPITAL_COMMUNITY): Payer: Managed Care, Other (non HMO)

## 2020-12-09 ENCOUNTER — Ambulatory Visit (INDEPENDENT_AMBULATORY_CARE_PROVIDER_SITE_OTHER): Payer: Managed Care, Other (non HMO)

## 2020-12-09 DIAGNOSIS — I428 Other cardiomyopathies: Secondary | ICD-10-CM | POA: Diagnosis not present

## 2020-12-10 ENCOUNTER — Ambulatory Visit
Admission: RE | Admit: 2020-12-10 | Discharge: 2020-12-10 | Disposition: A | Discharge status: Home / Self Care | Payer: Managed Care, Other (non HMO) | Source: Ambulatory Visit | Attending: General Surgery | Admitting: General Surgery

## 2020-12-10 ENCOUNTER — Other Ambulatory Visit (HOSPITAL_COMMUNITY): Payer: Self-pay

## 2020-12-10 ENCOUNTER — Other Ambulatory Visit: Payer: Self-pay | Admitting: General Surgery

## 2020-12-10 DIAGNOSIS — Z8719 Personal history of other diseases of the digestive system: Secondary | ICD-10-CM

## 2020-12-10 DIAGNOSIS — K219 Gastro-esophageal reflux disease without esophagitis: Secondary | ICD-10-CM

## 2020-12-10 MED ORDER — TORSEMIDE 20 MG PO TABS: ORAL_TABLET | ORAL | 0 refills | Status: DC

## 2020-12-10 MED ORDER — SPIRONOLACTONE 25 MG PO TABS: 25.0000 mg | ORAL_TABLET | Freq: Every day | ORAL | 0 refills | Status: DC

## 2020-12-11 ENCOUNTER — Other Ambulatory Visit (HOSPITAL_COMMUNITY): Payer: Self-pay

## 2020-12-11 LAB — CUP PACEART REMOTE DEVICE CHECK
Battery Remaining Longevity: 70 mo
Battery Voltage: 2.99 V
Brady Statistic RV Percent Paced: 0.01 %
Date Time Interrogation Session: 20220516031602
HighPow Impedance: 39 Ohm
HighPow Impedance: 47 Ohm
Implantable Lead Implant Date: 20070718
Implantable Lead Location: 753860
Implantable Pulse Generator Implant Date: 20160215
Lead Channel Impedance Value: 285 Ohm
Lead Channel Impedance Value: 304 Ohm
Lead Channel Pacing Threshold Amplitude: 1.875 V
Lead Channel Pacing Threshold Pulse Width: 0.4 ms
Lead Channel Sensing Intrinsic Amplitude: 10.875 mV
Lead Channel Sensing Intrinsic Amplitude: 10.875 mV
Lead Channel Setting Pacing Amplitude: 3.5 V
Lead Channel Setting Pacing Pulse Width: 0.7 ms
Lead Channel Setting Sensing Sensitivity: 0.3 mV

## 2020-12-11 MED ORDER — POTASSIUM CHLORIDE CRYS ER 20 MEQ PO TBCR: 30.0000 meq | EXTENDED_RELEASE_TABLET | Freq: Two times a day (BID) | ORAL | 0 refills | Status: DC

## 2020-12-11 MED ORDER — BISOPROLOL FUMARATE 5 MG PO TABS: ORAL_TABLET | ORAL | 0 refills | Status: DC

## 2020-12-11 MED ORDER — HYDRALAZINE HCL 25 MG PO TABS: 25.0000 mg | ORAL_TABLET | Freq: Three times a day (TID) | ORAL | 0 refills | Status: DC

## 2020-12-11 MED ORDER — ISOSORBIDE MONONITRATE ER 30 MG PO TB24: 30.0000 mg | ORAL_TABLET | Freq: Every day | ORAL | 0 refills | Status: DC

## 2020-12-24 NOTE — Progress Notes (Signed)
Patient ID: [name], female   DOB: 02/15/1976, 45 y.o.   MRN: [id number]    Advanced Heart Failure Clinic Note   PCP: Dr. Heath Gold  OB:  Dr. Leo Grosser  EP: Dr. Lovena Le CHF: [place]  HPI: [name] is a 45 y.o. female with h/o obesity, chronic systolic heart failure due to nonischemic cardiomyopathy (cath 2007. No CAD) s/p Medtronic ICD implantation - now with recovered EF, asthma/chronic bronchitic, gastric sleeve, and severe hypertension.   01/2017 EF 60-65%   04/2018 EF 25-30% (in setting of stopping meds). Meds were resumed. Echo repeated 09/2018 and EF improved to 45-50%.   Today she returns for HF follow up.Overall feeling fine.No chest pain on indigestion. Denies SOB/PND/Orthopnea. Appetite ok. No fever or chills. Weight at home 213 pounds. Not exercising but plans to started.  Taking all medications. 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   Optivol -Fluid index low. Activity ~ 3 hours a day. No VT/ No A fib.   Cardiac Testing  ECHOs 04/27/2011 45-50%.   08/20/11 ECHO 55-60%   05/19/12 EF ~40%   07/01/12 EF ~40%   08/02/12 EF~40%    2/14 EF ~50%                          5/14 EF ~35%                          1/16 EF 25-30%                           3/17 EF 55-60% Grade I DD    01/2017 EF 60-65%                          04/2018 EF 25-30% (in setting of stopping meds)    09/2018  EF 45-50%   5/2021EF 45-50%                           CPX 01/2014: Resting HR 75, Peak HR 121 Peak VO2 15.2, 74.4% predicted VE/VCO2 slope: 26.4 OUES: 1.93 Peak RER 1.15 VE/MVV 61.7%  SH: Married. Lives in [place], 2 children. Working at [employer]. No ETOH or smoking  FH: Mother living: DM2, HTN, no CAD        Father living: DM2, HTN  Review of systems complete and found to be negative unless listed in HPI.   Past Medical History:  Diagnosis Date  . Acute bronchitis and bronchiolitis   . Anemia    Iron supplements in the past  . Asthma    Inhaler prn;triggered by seasonal changes mostly  cold weathert;last asthma  attack years ago  . Atrial fibrillation ([place])   . CHF (congestive heart failure) ([place])   . Chronic systolic CHF (congestive heart failure), NYHA class 2 ([place]) 01/2006   Has MDT Defibrillator, EF had improved by 2017 echo  . Hypertension   . Hypertension   . Infection    UTI;not frequent;last infection was in June  . Infection    Yeast;not frequent  . Migraines    Can't take Procardia XL, causes migraines  . Other primary cardiomyopathies   . Paroxysmal ventricular tachycardia ([place])   . Varicose veins 1998   Developed during last pregnancy   Current Outpatient Medications  Medication Sig Dispense Refill  . albuterol (PROVENTIL HFA;VENTOLIN  HFA) 108 (90 Base) MCG/ACT inhaler Inhale 2 puffs into the lungs every 6 (six) hours as needed for wheezing or shortness of breath. Rescue inhaler 1 Inhaler 1  . bisoprolol (ZEBETA) 5 MG tablet TAKE 1 TABLET (5 MG TOTAL) BY MOUTH AT BEDTIME. NEEDS APPOINTMENT FOR FURTHER REFILL. 90 tablet 0  . calcium carbonate (OSCAL) 1500 (600 Ca) MG TABS tablet Take 600 mg of elemental calcium by mouth 3 (three) times daily with meals.    . Calcium Carbonate-Vit D-Min (QC CALCIUM-MAGNESIUM-ZINC-D3 PO) Take by mouth in the morning, at noon, and at bedtime.    . cetirizine (ZYRTEC) 10 MG tablet Take 10 mg by mouth daily as needed for allergies.     . ferrous sulfate 325 (65 FE) MG tablet Take 325 mg by mouth daily with breakfast.    . hydrALAZINE (APRESOLINE) 25 MG tablet Take 1 tablet (25 mg total) by mouth 3 (three) times daily. Appointment Required For Further Refills [phone] 270 tablet 0  . isosorbide mononitrate (IMDUR) 30 MG 24 hr tablet Take 1 tablet (30 mg total) by mouth daily. 90 tablet 0  . Multiple Vitamin (MULTIVITAMIN WITH MINERALS) TABS tablet Take 1 tablet by mouth daily.    . potassium chloride SA (KLOR-CON) 20 MEQ tablet Take 1.5 tablets (30 mEq total) by mouth 2 (two) times daily. Appointment Required For Further  Refills [phone] 270 tablet 0  . sacubitril-valsartan (ENTRESTO) 97-103 MG Take 1 tablet by mouth 2 (two) times daily. 180 tablet 3  . spironolactone (ALDACTONE) 25 MG tablet Take 1 tablet (25 mg total) by mouth daily. Needs to keep further appointments for refills 60 tablet 0  . torsemide (DEMADEX) 20 MG tablet TAKE 2 TABLETS (40 MG TOTAL) BY MOUTH DAILY. NEEDS APPOINTMENT FOR FURTHER REFILL. 60 tablet 0   No current facility-administered medications for this encounter.   Vitals:   12/25/20 0943  BP: 124/80  Pulse: 64  SpO2: 98%  Weight: 98.3 kg (216 lb 12.8 oz)     Wt Readings from Last 3 Encounters:  12/25/20 98.3 kg (216 lb 12.8 oz)  02/23/20 90.3 kg (199 lb)  11/27/19 101.5 kg (223 lb 12.8 oz)    PHYSICAL EXAM: General:  Well appearing. No resp difficulty HEENT: normal Neck: supple. no JVD. Carotids 2+ bilat; no bruits. No lymphadenopathy or thryomegaly appreciated. Cor: PMI nondisplaced. Regular rate & rhythm. No rubs, gallops or murmurs. Lungs: clear Abdomen: soft, nontender, nondistended. No hepatosplenomegaly. No bruits or masses. Good bowel sounds. Extremities: no cyanosis, clubbing, rash, edema Neuro: alert & orientedx3, cranial nerves grossly intact. moves all 4 extremities w/o difficulty. Affect pleasant   ASSESSMENT & PLAN:  1) Chronic combined systolic/diastolic heart failure  - NICM s/p ICD likely r/t HTN, EF in 2016 25-30%. Cath 8/16 showed normal cors - ECHO 01/2017 EF 55-60% - Echo 10-19 EF 25-30% in setting of medication noncompliance - Echo 09/2018, EF 45-50% - Echo 2021  EF 45-50%  - Has Medtronic ICD. Interrogation looks good. Not suggestive of fluid accumulation. Discussed interrogation.  - Continue Entresto 97/103 bid, spiro 25 daily, bisoprolol 2.5  - Cut back torsemide 20 mg daily with addition of farxiga.   - Add farxiga 10 mg daily. Provided with 30 day free card.  - Continue hydralazine/Imdure for now.  2) OSA  Followed by Dr Radford Pax  No  longer using CPAP but needs to restart. Asked to follow up with Dr Radford Pax.     3) HTN - Stable.   4) Obesity: -  S/p laparoscopic sleeve gastrectomy with hiatal hernia repair, upper endoscopy.  - Body mass index is 36.92 kg/m.   Start farxiga 10 mg daily. Discussed purpose and possible GU side effects. Check BMET in 7 days.   Follow up with Dr Haroldine Laws in 3 months with an [place], NP  9:46 AM

## 2020-12-25 ENCOUNTER — Encounter (HOSPITAL_COMMUNITY): Payer: Self-pay

## 2020-12-25 ENCOUNTER — Other Ambulatory Visit (HOSPITAL_COMMUNITY): Payer: Self-pay | Admitting: Adult Health

## 2020-12-25 ENCOUNTER — Other Ambulatory Visit (HOSPITAL_COMMUNITY): Payer: Self-pay

## 2020-12-25 ENCOUNTER — Ambulatory Visit (HOSPITAL_COMMUNITY)
Admission: RE | Admit: 2020-12-25 | Discharge: 2020-12-25 | Disposition: A | Discharge status: Home / Self Care | Payer: Managed Care, Other (non HMO) | Source: Ambulatory Visit | Attending: Adult Health | Admitting: Adult Health

## 2020-12-25 ENCOUNTER — Other Ambulatory Visit: Payer: Self-pay

## 2020-12-25 VITALS — BP 124/80 | HR 64 | Wt 216.8 lb

## 2020-12-25 DIAGNOSIS — I10 Essential (primary) hypertension: Secondary | ICD-10-CM

## 2020-12-25 DIAGNOSIS — Z6836 Body mass index (BMI) 36.0-36.9, adult: Secondary | ICD-10-CM | POA: Diagnosis not present

## 2020-12-25 DIAGNOSIS — G4733 Obstructive sleep apnea (adult) (pediatric): Secondary | ICD-10-CM | POA: Insufficient documentation

## 2020-12-25 DIAGNOSIS — I428 Other cardiomyopathies: Secondary | ICD-10-CM | POA: Insufficient documentation

## 2020-12-25 DIAGNOSIS — Z8249 Family history of ischemic heart disease and other diseases of the circulatory system: Secondary | ICD-10-CM | POA: Diagnosis not present

## 2020-12-25 DIAGNOSIS — I5042 Chronic combined systolic (congestive) and diastolic (congestive) heart failure: Secondary | ICD-10-CM | POA: Diagnosis present

## 2020-12-25 DIAGNOSIS — Z79899 Other long term (current) drug therapy: Secondary | ICD-10-CM | POA: Diagnosis not present

## 2020-12-25 DIAGNOSIS — I5022 Chronic systolic (congestive) heart failure: Secondary | ICD-10-CM | POA: Diagnosis not present

## 2020-12-25 DIAGNOSIS — Z95810 Presence of automatic (implantable) cardiac defibrillator: Secondary | ICD-10-CM | POA: Insufficient documentation

## 2020-12-25 DIAGNOSIS — J45909 Unspecified asthma, uncomplicated: Secondary | ICD-10-CM | POA: Diagnosis not present

## 2020-12-25 DIAGNOSIS — Z9884 Bariatric surgery status: Secondary | ICD-10-CM | POA: Insufficient documentation

## 2020-12-25 DIAGNOSIS — I110 Hypertensive heart disease with heart failure: Secondary | ICD-10-CM | POA: Diagnosis not present

## 2020-12-25 DIAGNOSIS — E669 Obesity, unspecified: Secondary | ICD-10-CM | POA: Insufficient documentation

## 2020-12-25 MED ORDER — TORSEMIDE 20 MG PO TABS: 20.0000 mg | ORAL_TABLET | Freq: Every day | ORAL | 3 refills | Status: DC

## 2020-12-25 MED ORDER — DAPAGLIFLOZIN PROPANEDIOL 10 MG PO TABS: 10.0000 mg | ORAL_TABLET | Freq: Every day | ORAL | 3 refills | Status: DC

## 2020-12-25 MED ORDER — DAPAGLIFLOZIN PROPANEDIOL 10 MG PO TABS
10.0000 mg | ORAL_TABLET | Freq: Every day | ORAL | 0 refills | Status: DC
  Filled 2020-12-25: qty 30, 30d supply, fill #0

## 2020-12-25 MED ORDER — TORSEMIDE 20 MG PO TABS
20.0000 mg | ORAL_TABLET | Freq: Every day | ORAL | 0 refills | Status: DC
  Filled 2020-12-25: qty 30, 30d supply, fill #0

## 2020-12-25 MED ORDER — DAPAGLIFLOZIN PROPANEDIOL 10 MG PO TABS
10.0000 mg | ORAL_TABLET | Freq: Every day | ORAL | 0 refills | Status: DC
  Filled 2020-12-25 – 2020-12-26 (×2): qty 30, 30d supply, fill #0

## 2020-12-25 NOTE — Addendum Note (Signed)
Encounter addended by: Harvie Junior, CMA on: 12/25/2020 3:52 PM  Actions taken: Pharmacy for encounter modified

## 2020-12-25 NOTE — Patient Instructions (Signed)
DECREASE Torsemide to 20mg  (1 tab) daily  START Farxiga 10mg  (1 tab) daily  Labs in 1 week We will only contact you if something comes back abnormal or we need to make some changes. Otherwise no news is good news!  Your physician recommends that you schedule a follow-up appointment in: 3 months with Dr Haroldine Laws  Please call office at [phone] option 2 if you have any questions or concerns.   At the [facility], you and your health needs are our priority. As part of our continuing mission to provide you with exceptional heart care, we have created designated Provider Care Teams. These Care Teams include your primary Cardiologist (physician) and Advanced Practice Providers (APPs- Physician Assistants and Nurse Practitioners) who all work together to provide you with the care you need, when you need it.   You may see any of the following providers on your designated Care Team at your next follow up: Marland Kitchen Dr Glori Bickers . Dr Loralie Champagne . Dr Vickki Muff . Darrick Grinder, NP . Lyda Jester, [place] . Audry Riles, PharmD   Please be sure to bring in all your medications bottles to every appointment.   \ .

## 2020-12-26 ENCOUNTER — Other Ambulatory Visit (HOSPITAL_COMMUNITY): Payer: Self-pay

## 2021-01-01 ENCOUNTER — Other Ambulatory Visit (HOSPITAL_COMMUNITY): Payer: Managed Care, Other (non HMO)

## 2021-01-01 NOTE — Progress Notes (Signed)
Remote ICD transmission.   

## 2021-01-08 ENCOUNTER — Other Ambulatory Visit: Payer: Self-pay

## 2021-01-08 ENCOUNTER — Ambulatory Visit (HOSPITAL_COMMUNITY)
Admission: RE | Admit: 2021-01-08 | Discharge: 2021-01-08 | Disposition: A | Discharge status: Home / Self Care | Payer: Managed Care, Other (non HMO) | Source: Ambulatory Visit | Attending: Cardiology | Admitting: Cardiology

## 2021-01-08 DIAGNOSIS — I5022 Chronic systolic (congestive) heart failure: Secondary | ICD-10-CM | POA: Diagnosis present

## 2021-01-08 LAB — BASIC METABOLIC PANEL
Anion gap: 7 (ref 5–15)
BUN: 19 mg/dL (ref 6–20)
CO2: 27 mmol/L (ref 22–32)
Calcium: 8.7 mg/dL — ABNORMAL LOW (ref 8.9–10.3)
Chloride: 105 mmol/L (ref 98–111)
Creatinine, Ser: 1.57 mg/dL — ABNORMAL HIGH (ref 0.44–1.00)
GFR, Estimated: 41 mL/min — ABNORMAL LOW (ref >60)
Glucose, Bld: 112 mg/dL — ABNORMAL HIGH (ref 70–99)
Potassium: 4 mmol/L (ref 3.5–5.1)
Sodium: 139 mmol/L (ref 135–145)

## 2021-03-10 ENCOUNTER — Ambulatory Visit (INDEPENDENT_AMBULATORY_CARE_PROVIDER_SITE_OTHER): Payer: Managed Care, Other (non HMO)

## 2021-03-10 DIAGNOSIS — I428 Other cardiomyopathies: Secondary | ICD-10-CM | POA: Diagnosis not present

## 2021-03-11 LAB — CUP PACEART REMOTE DEVICE CHECK
Battery Remaining Longevity: 65 mo
Battery Voltage: 2.99 V
Brady Statistic RV Percent Paced: 0.01 %
Date Time Interrogation Session: 20220816001703
HighPow Impedance: 43 Ohm
HighPow Impedance: 57 Ohm
Implantable Lead Implant Date: 20070718
Implantable Lead Location: 753860
Implantable Pulse Generator Implant Date: 20160215
Lead Channel Impedance Value: 247 Ohm
Lead Channel Impedance Value: 342 Ohm
Lead Channel Pacing Threshold Amplitude: 2.125 V
Lead Channel Pacing Threshold Pulse Width: 0.4 ms
Lead Channel Sensing Intrinsic Amplitude: 13 mV
Lead Channel Sensing Intrinsic Amplitude: 13 mV
Lead Channel Setting Pacing Amplitude: 3.5 V
Lead Channel Setting Pacing Pulse Width: 0.7 ms
Lead Channel Setting Sensing Sensitivity: 0.3 mV

## 2021-03-27 ENCOUNTER — Ambulatory Visit (HOSPITAL_COMMUNITY)
Admission: RE | Admit: 2021-03-27 | Discharge: 2021-03-27 | Disposition: A | Discharge status: Home / Self Care | Payer: Managed Care, Other (non HMO) | Source: Ambulatory Visit | Attending: Internal Medicine | Admitting: Internal Medicine

## 2021-03-27 ENCOUNTER — Other Ambulatory Visit: Payer: Self-pay

## 2021-03-27 ENCOUNTER — Encounter (HOSPITAL_COMMUNITY): Payer: Self-pay | Admitting: Internal Medicine

## 2021-03-27 VITALS — BP 98/68 | HR 76 | Wt 212.8 lb

## 2021-03-27 DIAGNOSIS — I5032 Chronic diastolic (congestive) heart failure: Secondary | ICD-10-CM | POA: Diagnosis present

## 2021-03-27 DIAGNOSIS — G4733 Obstructive sleep apnea (adult) (pediatric): Secondary | ICD-10-CM

## 2021-03-27 DIAGNOSIS — I5022 Chronic systolic (congestive) heart failure: Secondary | ICD-10-CM

## 2021-03-27 DIAGNOSIS — I10 Essential (primary) hypertension: Secondary | ICD-10-CM

## 2021-03-27 LAB — BASIC METABOLIC PANEL
Anion gap: 10 (ref 5–15)
BUN: 21 mg/dL — ABNORMAL HIGH (ref 6–20)
CO2: 24 mmol/L (ref 22–32)
Calcium: 8.3 mg/dL — ABNORMAL LOW (ref 8.9–10.3)
Chloride: 105 mmol/L (ref 98–111)
Creatinine, Ser: 1.38 mg/dL — ABNORMAL HIGH (ref 0.44–1.00)
GFR, Estimated: 48 mL/min — ABNORMAL LOW (ref >60)
Glucose, Bld: 107 mg/dL — ABNORMAL HIGH (ref 70–99)
Potassium: 3.8 mmol/L (ref 3.5–5.1)
Sodium: 139 mmol/L (ref 135–145)

## 2021-03-27 LAB — CBC
HCT: 34.8 % — ABNORMAL LOW (ref 36.0–46.0)
Hemoglobin: 10.4 g/dL — ABNORMAL LOW (ref 12.0–15.0)
MCH: 23.4 pg — ABNORMAL LOW (ref 26.0–34.0)
MCHC: 29.9 g/dL — ABNORMAL LOW (ref 30.0–36.0)
MCV: 78.2 fL — ABNORMAL LOW (ref 80.0–100.0)
Platelets: 300 10*3/uL (ref 150–400)
RBC: 4.45 MIL/uL (ref 3.87–5.11)
RDW: 16.1 % — ABNORMAL HIGH (ref 11.5–15.5)
WBC: 7.8 10*3/uL (ref 4.0–10.5)
nRBC: 0 % (ref 0.0–0.2)

## 2021-03-27 LAB — MAGNESIUM: Magnesium: 2.2 mg/dL (ref 1.7–2.4)

## 2021-03-27 NOTE — Progress Notes (Addendum)
Patient ID: [name], female   DOB: Dec 09, 1975, 45 y.o.   MRN: [id number]    Advanced Heart Failure Clinic Note   PCP: Dr. Heath Gold  OB:  Dr. Leo Grosser  EP: Dr. Lovena Le CHF: [place]  Reason for Visit: F/u for chronic systolic heart failure   HPI: [name] is a 45 y.o. female with h/o obesity, chronic systolic heart failure due to nonischemic cardiomyopathy (cath 2007. No CAD) s/p Medtronic ICD implantation - now with recovered EF, asthma/chronic bronchitic, gastric sleeve, and severe hypertension.   Echo 01/2017 EF 60-65%   Echo 04/2018 EF 25-30% (in setting of stopping meds). Meds were resumed. Echo repeated 09/2018 and EF improved to 45-50%.   Last seen in [facility] 6/22 and was doing well from symptom and volume standpoint. Wilder Glade was added and torsemide reduced to 20 mg daily.   She returns now for 3 month f/u. Wt is down 4 lb from last visit. Volume status stable on exam and on Optivol. Impedence up/ fluid index down. No VT/VF. Activity level ~3hr/day (walks for exercise). Initial BP 98/68. I personally rechecked and BP 110/70, close to usual baseline. Denies significant dyspnea w/ ADLs. No LEE, orthopnea/PND. No palpitations, dizziness, or CP. Reports full med compliance. Tolerating meds ok. No GU side effects w/ Wilder Glade. Her only compliant is mild daytime fatigue and hand cramping. She is not using CPAP at night. Awaiting replacement device.    Optivol -Impedance up. Fluid index low. Activity ~ 3 hours a day. No VT/VF. No A fib.   Cardiac Testing  ECHOs 04/27/2011 45-50%.   08/20/11 ECHO 55-60%   05/19/12 EF ~40%   07/01/12 EF ~40%   08/02/12 EF~40%    2/14 EF ~50%                          5/14 EF ~35%                          1/16 EF 25-30%                           3/17 EF 55-60% Grade I DD    01/2017 EF 60-65%                          04/2018 EF 25-30% (in setting of stopping meds)    09/2018  EF 45-50%   5/2021EF 45-50%                           CPX 01/2014: Resting HR  75, Peak HR 121 Peak VO2 15.2, 74.4% predicted VE/VCO2 slope: 26.4 OUES: 1.93 Peak RER 1.15 VE/MVV 61.7%  SH: Married. Lives in [place], 2 children. Working at [employer]. No ETOH or smoking  FH: Mother living: DM2, HTN, no CAD        Father living: DM2, HTN  Review of systems complete and found to be negative unless listed in HPI.   Past Medical History:  Diagnosis Date   Acute bronchitis and bronchiolitis    Anemia    Iron supplements in the past   Asthma    Inhaler prn;triggered by seasonal changes mostly cold weathert;last asthma  attack years ago   Atrial fibrillation ([facility])    CHF (congestive heart failure) ([facility])    Chronic systolic CHF (congestive heart  failure), NYHA class 2 ([place]) 01/2006   Has MDT Defibrillator, EF had improved by 2017 echo   Hypertension    Hypertension    Infection    UTI;not frequent;last infection was in June   Infection    Yeast;not frequent   Migraines    Can't take Procardia XL, causes migraines   Other primary cardiomyopathies    Paroxysmal ventricular tachycardia ([place])    Varicose veins 1998   Developed during last pregnancy   Current Outpatient Medications  Medication Sig Dispense Refill   albuterol (PROVENTIL HFA;VENTOLIN HFA) 108 (90 Base) MCG/ACT inhaler Inhale 2 puffs into the lungs every 6 (six) hours as needed for wheezing or shortness of breath. Rescue inhaler 1 Inhaler 1   bisoprolol (ZEBETA) 5 MG tablet TAKE 1 TABLET (5 MG TOTAL) BY MOUTH AT BEDTIME. NEEDS APPOINTMENT FOR FURTHER REFILL. (Patient taking differently: Patient takes  0.5 tablet a day.) 90 tablet 0   calcium carbonate (OSCAL) 1500 (600 Ca) MG TABS tablet Take 600 mg of elemental calcium by mouth 3 (three) times daily with meals.     Calcium Carbonate-Vit D-Min (QC CALCIUM-MAGNESIUM-ZINC-D3 PO) Take by mouth in the morning, at noon, and at bedtime.     cetirizine (ZYRTEC) 10 MG tablet Take 10 mg by mouth daily as needed for allergies.      dapagliflozin  propanediol (FARXIGA) 10 MG TABS tablet Take 1 tablet (10 mg total) by mouth daily before breakfast. 90 tablet 3   ferrous sulfate 325 (65 FE) MG tablet Take 325 mg by mouth daily with breakfast.     hydrALAZINE (APRESOLINE) 25 MG tablet Take 1 tablet (25 mg total) by mouth 3 (three) times daily. Appointment Required For Further Refills [phone] 270 tablet 0   isosorbide mononitrate (IMDUR) 30 MG 24 hr tablet Take 1 tablet (30 mg total) by mouth daily. 90 tablet 0   Multiple Vitamin (MULTIVITAMIN WITH MINERALS) TABS tablet Take 1 tablet by mouth daily.     potassium chloride SA (KLOR-CON) 20 MEQ tablet Take 1.5 tablets (30 mEq total) by mouth 2 (two) times daily. Appointment Required For Further Refills [phone] 270 tablet 0   sacubitril-valsartan (ENTRESTO) 97-103 MG Take 1 tablet by mouth 2 (two) times daily. 180 tablet 3   spironolactone (ALDACTONE) 25 MG tablet Take 1 tablet (25 mg total) by mouth daily. Needs to keep further appointments for refills 60 tablet 0   torsemide (DEMADEX) 20 MG tablet Take 1 tablet (20 mg total) by mouth daily. 30 tablet 0   No current facility-administered medications for this encounter.   Vitals:   03/27/21 1514  BP: 98/68  Pulse: 76  SpO2: 99%  Weight: 96.5 kg (212 lb 12.8 oz)      Wt Readings from Last 3 Encounters:  03/27/21 96.5 kg (212 lb 12.8 oz)  12/25/20 98.3 kg (216 lb 12.8 oz)  02/23/20 90.3 kg (199 lb)   PHYSICAL EXAM: General:  Well appearing, moderately obese. No respiratory difficulty HEENT: normal Neck: supple. no JVD. Carotids 2+ bilat; no bruits. No lymphadenopathy or thyromegaly appreciated. Cor: PMI nondisplaced. Regular rate & rhythm. No rubs, gallops or murmurs. Lungs: clear Abdomen: soft, nontender, nondistended. No hepatosplenomegaly. No bruits or masses. Good bowel sounds. Extremities: no cyanosis, clubbing, rash, edema Neuro: alert & oriented x 3, cranial nerves grossly intact. moves all 4 extremities w/o  difficulty. Affect pleasant.    ASSESSMENT & PLAN:  1) Chronic combined systolic/diastolic heart failure  - NICM s/p ICD likely r/t HTN, EF  in 2016 25-30%. Cath 8/16 showed normal cors - ECHO 01/2017 EF 55-60% - Echo 10-19 EF 25-30% in setting of medication noncompliance - Echo 09/2018, EF 45-50% - Echo 11/2019  EF 45-50% - Has Medtronic ICD. Interrogation not suggestive of fluid accumulation. Euvolemic on exam. No VT/VF. No AF. - NYHA Class II (stable)  - Continue Farxiga 10 mg daily  - Continue Entresto 97/103 bid, - Continue Spiro 25 daily - Continue bisoprolol 2.5 mg daily  - Continue torsemide 20 mg daily - Continue hydralazine 25 mg tid - Continue Imdur 30 mg daily  - Check BMP and Mg level today   2) OSA  - Followed by Dr Radford Pax  - No longer using CPAP but needs to restart. Reports daytime fatigue. Reports issues w/ current CPAP device - She plans to contact device company for replacement    3) HTN - controlled on current regimen. No changes   - check BMP today   4) Obesity: - S/p laparoscopic sleeve gastrectomy with hiatal hernia repair, upper endoscopy.  - Body mass index is 36.24 kg/m. - encouraged to continue walking for exercise   F/u in 6 months w/ Dr. Haroldine Laws w/ repeat echo    Lyda Jester, PA-C  3:21 PM  Patient seen and examined with the above-signed Advanced Practice Provider and/or Housestaff. I personally reviewed laboratory data, imaging studies and relevant notes. I independently examined the patient and formulated the important aspects of the plan. I have edited the note to reflect any of my changes or salient points. I have personally discussed the plan with the patient and/or family.  Doing fairly well. Stable NYHA II. Remains fairly active.  Volume status looks ok. No orthopnea or PND. Compliant with meds. Not wearing CPAP  ICD interrogated personally. No VT/AF. Fluid ok. Activity level 3hr/day. Personally reviewed  General:  Well  appearing. No resp difficulty HEENT: normal Neck: supple. no JVD. Carotids 2+ bilat; no bruits. No lymphadenopathy or thryomegaly appreciated. Cor: PMI nondisplaced. Regular rate & rhythm. No rubs, gallops or murmurs. Lungs: clear Abdomen: obese soft, nontender, nondistended. No hepatosplenomegaly. No bruits or masses. Good bowel sounds. Extremities: no cyanosis, clubbing, rash, edema Neuro: alert & orientedx3, cranial nerves grossly intact. moves all 4 extremities w/o difficulty. Affect pleasant  Doing well. Stable NYHA II. Volume ok. On good meds. ICD interrogation ok. Encouraged her to wear CPAP.   Glori Bickers, MD  8:06 PM

## 2021-03-27 NOTE — Patient Instructions (Addendum)
Labs done today. We will contact you only if your labs are abnormal.  No medication changes were made. Please continue all current medications as prescribed.  Your physician recommends that you schedule a follow-up appointment in: 6 months with an echo prior to your exam. Please contact our office in February to schedule a March appointment.   Your physician has requested that you have an echocardiogram. Echocardiography is a painless test that uses sound waves to create images of your heart. It provides your doctor with information about the size and shape of your heart and how well your heart's chambers and valves are working. This procedure takes approximately one hour. There are no restrictions for this procedure.  If you have any questions or concerns before your next appointment please send [place] a message through [place] or call our office at [phone].    TO LEAVE A MESSAGE FOR THE NURSE SELECT OPTION 2, PLEASE LEAVE A MESSAGE INCLUDING: YOUR NAME DATE OF BIRTH CALL BACK NUMBER REASON FOR CALL**this is important as we prioritize the call backs  YOU WILL RECEIVE A CALL BACK THE SAME DAY AS LONG AS YOU CALL BEFORE 4:00 PM   Do the following things EVERYDAY: Weigh yourself in the morning before breakfast. Write it down and keep it in a log. Take your medicines as prescribed Eat low salt foods--Limit salt (sodium) to 2000 mg per day.  Stay as active as you can everyday Limit all fluids for the day to less than 2 liters   At the [facility], you and your health needs are our priority. As part of our continuing mission to provide you with exceptional heart care, we have created designated Provider Care Teams. These Care Teams include your primary Cardiologist (physician) and Advanced Practice Providers (APPs- Physician Assistants and Nurse Practitioners) who all work together to provide you with the care you need, when you need it.   You may see any of the following  providers on your designated Care Team at your next follow up: Dr Glori Bickers Dr Haynes Kerns, NP Lyda Jester, [place] Audry Riles, PharmD   Please be sure to bring in all your medications bottles to every appointment.

## 2021-03-29 NOTE — Progress Notes (Signed)
Remote ICD transmission.   

## 2021-04-02 ENCOUNTER — Telehealth (HOSPITAL_COMMUNITY): Payer: Self-pay

## 2021-04-02 MED ORDER — FLUCONAZOLE 150 MG PO TABS: 150.0000 mg | ORAL_TABLET | Freq: Once | ORAL | 0 refills | Status: AC

## 2021-04-02 NOTE — Telephone Encounter (Signed)
Patient advised and verbalized understanding. Rx sent into patients pharmacy.  Meds ordered this encounter  Medications   fluconazole (DIFLUCAN) 150 MG tablet    Sig: Take 1 tablet (150 mg total) by mouth once for 1 dose.    Dispense:  1 tablet    Refill:  0

## 2021-04-02 NOTE — Addendum Note (Signed)
Addended by: Shela Nevin R on: [id number] 04:48 PM   Modules accepted: Orders

## 2021-04-02 NOTE — Telephone Encounter (Signed)
Patient called requesting a Rx for fluconazole. She states she has developed a yeast infection since starting the [place]. She reports that her symptoms started last Friday(03/28/21). Please advise

## 2021-04-30 ENCOUNTER — Other Ambulatory Visit (HOSPITAL_COMMUNITY): Payer: Self-pay | Admitting: Internal Medicine

## 2021-05-19 ENCOUNTER — Other Ambulatory Visit (HOSPITAL_COMMUNITY): Payer: Self-pay | Admitting: Internal Medicine

## 2021-05-22 ENCOUNTER — Encounter: Payer: Self-pay | Admitting: Internal Medicine

## 2021-06-09 ENCOUNTER — Ambulatory Visit (INDEPENDENT_AMBULATORY_CARE_PROVIDER_SITE_OTHER): Payer: Managed Care, Other (non HMO)

## 2021-06-09 DIAGNOSIS — I428 Other cardiomyopathies: Secondary | ICD-10-CM | POA: Diagnosis not present

## 2021-06-10 LAB — CUP PACEART REMOTE DEVICE CHECK
Battery Remaining Longevity: 59 mo
Battery Voltage: 2.99 V
Brady Statistic RV Percent Paced: 0.02 %
Date Time Interrogation Session: 20221114042407
HighPow Impedance: 41 Ohm
HighPow Impedance: 51 Ohm
Implantable Lead Implant Date: 20070718
Implantable Lead Location: 753860
Implantable Pulse Generator Implant Date: 20160215
Lead Channel Impedance Value: 285 Ohm
Lead Channel Impedance Value: 342 Ohm
Lead Channel Pacing Threshold Amplitude: 1.875 V
Lead Channel Pacing Threshold Pulse Width: 0.4 ms
Lead Channel Sensing Intrinsic Amplitude: 13.875 mV
Lead Channel Sensing Intrinsic Amplitude: 13.875 mV
Lead Channel Setting Pacing Amplitude: 3.5 V
Lead Channel Setting Pacing Pulse Width: 0.7 ms
Lead Channel Setting Sensing Sensitivity: 0.3 mV

## 2021-06-17 NOTE — Progress Notes (Signed)
Remote ICD transmission.   

## 2021-07-17 ENCOUNTER — Other Ambulatory Visit (HOSPITAL_COMMUNITY): Payer: Self-pay

## 2021-07-17 ENCOUNTER — Other Ambulatory Visit (HOSPITAL_COMMUNITY): Payer: Self-pay | Admitting: *Deleted

## 2021-07-17 MED ORDER — DAPAGLIFLOZIN PROPANEDIOL 10 MG PO TABS
10.0000 mg | ORAL_TABLET | Freq: Every day | ORAL | 3 refills | Status: DC
  Filled 2021-07-17: qty 30, 30d supply, fill #0
  Filled 2021-08-24: qty 30, 30d supply, fill #1
  Filled 2021-10-22: qty 30, 30d supply, fill #2

## 2021-08-25 ENCOUNTER — Other Ambulatory Visit (HOSPITAL_COMMUNITY): Payer: Self-pay

## 2021-08-25 ENCOUNTER — Telehealth (HOSPITAL_COMMUNITY): Payer: Self-pay | Admitting: *Deleted

## 2021-08-25 ENCOUNTER — Other Ambulatory Visit (HOSPITAL_COMMUNITY): Payer: Self-pay | Admitting: *Deleted

## 2021-08-25 MED ORDER — SPIRONOLACTONE 25 MG PO TABS
25.0000 mg | ORAL_TABLET | Freq: Every day | ORAL | 3 refills | Status: DC
  Filled 2021-08-25: qty 30, 30d supply, fill #0
  Filled 2021-10-22: qty 30, 30d supply, fill #1
  Filled 2021-12-30: qty 30, 30d supply, fill #2
  Filled 2022-02-16: qty 30, 30d supply, fill #3

## 2021-08-25 MED ORDER — SPIRONOLACTONE 25 MG PO TABS
25.0000 mg | ORAL_TABLET | Freq: Every day | ORAL | 3 refills | Status: DC
  Filled 2021-08-25: qty 30, 30d supply, fill #0

## 2021-08-25 MED ORDER — ISOSORBIDE MONONITRATE ER 30 MG PO TB24
30.0000 mg | ORAL_TABLET | Freq: Every day | ORAL | 3 refills | Status: DC
  Filled 2021-08-25: qty 30, 30d supply, fill #0
  Filled 2021-10-22: qty 30, 30d supply, fill #1
  Filled 2021-12-30: qty 30, 30d supply, fill #2
  Filled 2022-02-16: qty 30, 30d supply, fill #3
  Filled 2022-03-20: qty 30, 30d supply, fill #4
  Filled 2022-04-18: qty 30, 30d supply, fill #5
  Filled 2022-06-26: qty 30, 30d supply, fill #6
  Filled 2022-07-24 (×2): qty 30, 30d supply, fill #7

## 2021-08-25 MED ORDER — POTASSIUM CHLORIDE CRYS ER 20 MEQ PO TBCR
30.0000 meq | EXTENDED_RELEASE_TABLET | Freq: Two times a day (BID) | ORAL | 3 refills | Status: DC
  Filled 2021-08-25: qty 90, 30d supply, fill #0
  Filled 2021-10-22: qty 90, 30d supply, fill #1
  Filled 2021-12-30: qty 90, 30d supply, fill #2
  Filled 2022-02-16: qty 90, 30d supply, fill #3

## 2021-08-25 MED ORDER — TORSEMIDE 20 MG PO TABS
20.0000 mg | ORAL_TABLET | Freq: Every day | ORAL | 3 refills | Status: DC
  Filled 2021-08-25: qty 30, 30d supply, fill #0
  Filled 2021-10-22: qty 30, 30d supply, fill #1
  Filled 2021-12-30: qty 30, 30d supply, fill #2
  Filled 2022-02-16: qty 30, 30d supply, fill #3

## 2021-08-25 NOTE — Telephone Encounter (Signed)
Pt called stating she no longer has insurance and needs assistance with entresto.   Routed to [name]

## 2021-08-25 NOTE — Addendum Note (Signed)
Addended by: Kerry Dory on: 08/25/2021 11:44 AM   Modules accepted: Orders

## 2021-08-26 NOTE — Telephone Encounter (Signed)
Advanced Heart Failure Patient Advocate Encounter  Called and spoke with patient, she will come sign application when picking up samples. She is aware that POI is required as well.

## 2021-09-17 ENCOUNTER — Other Ambulatory Visit (HOSPITAL_COMMUNITY): Payer: Self-pay

## 2021-09-17 MED ORDER — SACUBITRIL-VALSARTAN 97-103 MG PO TABS: 1.0000 | ORAL_TABLET | Freq: Two times a day (BID) | ORAL | 3 refills | Status: DC

## 2021-09-17 NOTE — Telephone Encounter (Signed)
Sent in application with POI via fax.  Will follow up.

## 2021-09-24 ENCOUNTER — Telehealth (HOSPITAL_COMMUNITY): Payer: Self-pay | Admitting: Pharmacy Technician

## 2021-09-24 NOTE — Telephone Encounter (Signed)
Advanced Heart Failure Patient Advocate Encounter  ? ?Patient was approved to receive Entresto from [employer] ? ?Effective dates: 09/22/21 through 09/22/22 ? ?Document scanned to chart. Called and left the patient a message.  ? ?Charlann Boxer, CPhT ? ? ?

## 2021-10-17 ENCOUNTER — Other Ambulatory Visit (HOSPITAL_COMMUNITY): Payer: Self-pay

## 2021-10-22 ENCOUNTER — Other Ambulatory Visit (HOSPITAL_COMMUNITY): Payer: Self-pay

## 2021-10-24 ENCOUNTER — Telehealth (HOSPITAL_COMMUNITY): Payer: Self-pay

## 2021-10-24 MED ORDER — FLUCONAZOLE 150 MG PO TABS: 150.0000 mg | ORAL_TABLET | Freq: Once | ORAL | 0 refills | Status: AC

## 2021-11-05 NOTE — Telephone Encounter (Signed)
Patient called to report that she developed another yeast infection due to being on Farxiga. Per Kevan Rosebush, RN D/C Wilder Glade and send in [place]. Please advise if another medication needs to be started to replace the [place] ?

## 2021-11-05 NOTE — Telephone Encounter (Signed)
Patient advised and verbalized understanding. Rx sent ? ?

## 2021-11-14 ENCOUNTER — Telehealth (HOSPITAL_COMMUNITY): Payer: Self-pay | Admitting: *Deleted

## 2021-11-14 NOTE — Telephone Encounter (Signed)
Pt left vm stating she's gained 6lbs of fluid since stopping farxiga (med was stopped due to frequent yeast infections). Pt asked if she needed to increase her torsemide.  ? ?Routed to [place] for advice  ?

## 2021-11-14 NOTE — Telephone Encounter (Signed)
Pt aware and agreeable plan.  ?

## 2021-12-30 ENCOUNTER — Other Ambulatory Visit (HOSPITAL_COMMUNITY): Payer: Self-pay

## 2022-01-21 ENCOUNTER — Ambulatory Visit (INDEPENDENT_AMBULATORY_CARE_PROVIDER_SITE_OTHER): Payer: Self-pay

## 2022-01-21 DIAGNOSIS — I5032 Chronic diastolic (congestive) heart failure: Secondary | ICD-10-CM

## 2022-01-21 DIAGNOSIS — Z95810 Presence of automatic (implantable) cardiac defibrillator: Secondary | ICD-10-CM

## 2022-01-22 LAB — CUP PACEART REMOTE DEVICE CHECK
Battery Remaining Longevity: 54 mo
Battery Voltage: 2.9 V
Brady Statistic RV Percent Paced: 0.01 %
Date Time Interrogation Session: 20230626232722
HighPow Impedance: 42 Ohm
HighPow Impedance: 52 Ohm
Implantable Lead Implant Date: 20070718
Implantable Lead Location: 753860
Implantable Pulse Generator Implant Date: 20160215
Lead Channel Impedance Value: 285 Ohm
Lead Channel Impedance Value: 342 Ohm
Lead Channel Pacing Threshold Amplitude: 2 V
Lead Channel Pacing Threshold Pulse Width: 0.4 ms
Lead Channel Sensing Intrinsic Amplitude: 13 mV
Lead Channel Sensing Intrinsic Amplitude: 13 mV
Lead Channel Setting Pacing Amplitude: 3.5 V
Lead Channel Setting Pacing Pulse Width: 0.7 ms
Lead Channel Setting Sensing Sensitivity: 0.3 mV

## 2022-02-10 NOTE — Progress Notes (Signed)
Remote ICD transmission.   

## 2022-02-17 ENCOUNTER — Other Ambulatory Visit (HOSPITAL_COMMUNITY): Payer: Self-pay

## 2022-03-20 ENCOUNTER — Other Ambulatory Visit (HOSPITAL_COMMUNITY): Payer: Self-pay

## 2022-03-20 ENCOUNTER — Other Ambulatory Visit (HOSPITAL_COMMUNITY): Payer: Self-pay | Admitting: Adult Health

## 2022-03-20 ENCOUNTER — Other Ambulatory Visit (HOSPITAL_COMMUNITY): Payer: Self-pay | Admitting: Cardiology

## 2022-03-20 MED ORDER — SPIRONOLACTONE 25 MG PO TABS
25.0000 mg | ORAL_TABLET | Freq: Every day | ORAL | 3 refills | Status: DC
  Filled 2022-03-20: qty 30, 30d supply, fill #0
  Filled 2022-04-18: qty 30, 30d supply, fill #1
  Filled 2022-06-26: qty 30, 30d supply, fill #2
  Filled 2022-07-24: qty 30, 30d supply, fill #3

## 2022-03-20 MED ORDER — TORSEMIDE 20 MG PO TABS
20.0000 mg | ORAL_TABLET | Freq: Every day | ORAL | 3 refills | Status: DC
  Filled 2022-03-20: qty 30, 30d supply, fill #0
  Filled 2022-04-18: qty 30, 30d supply, fill #1
  Filled 2022-06-26: qty 30, 30d supply, fill #2
  Filled 2022-07-24: qty 30, 30d supply, fill #3

## 2022-03-20 MED ORDER — POTASSIUM CHLORIDE CRYS ER 20 MEQ PO TBCR
30.0000 meq | EXTENDED_RELEASE_TABLET | Freq: Two times a day (BID) | ORAL | 3 refills | Status: DC
  Filled 2022-03-20: qty 90, 30d supply, fill #0
  Filled 2022-04-18: qty 90, 30d supply, fill #1
  Filled 2022-06-26: qty 90, 30d supply, fill #2
  Filled 2022-07-24: qty 90, 30d supply, fill #3

## 2022-04-18 ENCOUNTER — Other Ambulatory Visit (HOSPITAL_COMMUNITY): Payer: Self-pay

## 2022-04-20 ENCOUNTER — Other Ambulatory Visit (HOSPITAL_COMMUNITY): Payer: Self-pay

## 2022-04-22 ENCOUNTER — Other Ambulatory Visit (HOSPITAL_COMMUNITY): Payer: Self-pay

## 2022-04-22 ENCOUNTER — Ambulatory Visit (INDEPENDENT_AMBULATORY_CARE_PROVIDER_SITE_OTHER): Payer: Self-pay

## 2022-04-22 DIAGNOSIS — I428 Other cardiomyopathies: Secondary | ICD-10-CM

## 2022-04-23 LAB — CUP PACEART REMOTE DEVICE CHECK
Battery Remaining Longevity: 52 mo
Battery Voltage: 2.91 V
Brady Statistic RV Percent Paced: 0.02 %
Date Time Interrogation Session: 20230927012505
HighPow Impedance: 44 Ohm
HighPow Impedance: 55 Ohm
Implantable Lead Implant Date: 20070718
Implantable Lead Location: 753860
Implantable Pulse Generator Implant Date: 20160215
Lead Channel Impedance Value: 304 Ohm
Lead Channel Impedance Value: 342 Ohm
Lead Channel Pacing Threshold Amplitude: 2 V
Lead Channel Pacing Threshold Pulse Width: 0.4 ms
Lead Channel Sensing Intrinsic Amplitude: 11.375 mV
Lead Channel Sensing Intrinsic Amplitude: 11.375 mV
Lead Channel Setting Pacing Amplitude: 3.5 V
Lead Channel Setting Pacing Pulse Width: 0.7 ms
Lead Channel Setting Sensing Sensitivity: 0.3 mV

## 2022-04-30 NOTE — Progress Notes (Signed)
Remote ICD transmission.   

## 2022-06-26 ENCOUNTER — Encounter (HOSPITAL_COMMUNITY): Payer: Self-pay | Admitting: *Deleted

## 2022-06-26 ENCOUNTER — Other Ambulatory Visit (HOSPITAL_COMMUNITY): Payer: Self-pay

## 2022-06-26 MED ORDER — PANTOPRAZOLE SODIUM 40 MG PO TBEC
40.0000 mg | DELAYED_RELEASE_TABLET | Freq: Two times a day (BID) | ORAL | 11 refills | Status: DC
  Filled 2022-06-26: qty 60, 30d supply, fill #0
  Filled 2022-07-24 (×2): qty 60, 30d supply, fill #1
  Filled 2022-09-18: qty 60, 30d supply, fill #2
  Filled 2022-10-23: qty 60, 30d supply, fill #3
  Filled 2022-12-10: qty 60, 30d supply, fill #4
  Filled 2023-01-08: qty 60, 30d supply, fill #5
  Filled 2023-02-15: qty 60, 30d supply, fill #6
  Filled 2023-03-30: qty 60, 30d supply, fill #7
  Filled 2023-05-07: qty 60, 30d supply, fill #8
  Filled 2023-06-22: qty 60, 30d supply, fill #9

## 2022-06-27 ENCOUNTER — Other Ambulatory Visit (HOSPITAL_COMMUNITY): Payer: Self-pay

## 2022-06-29 ENCOUNTER — Other Ambulatory Visit (HOSPITAL_COMMUNITY): Payer: Self-pay

## 2022-06-29 ENCOUNTER — Other Ambulatory Visit (HOSPITAL_COMMUNITY): Payer: Self-pay | Admitting: *Deleted

## 2022-06-29 MED ORDER — HYDRALAZINE HCL 25 MG PO TABS
25.0000 mg | ORAL_TABLET | Freq: Three times a day (TID) | ORAL | 3 refills | Status: DC
  Filled 2022-06-29: qty 270, 90d supply, fill #0
  Filled 2022-12-10: qty 270, 90d supply, fill #1
  Filled 2023-05-07 – 2023-05-11 (×2): qty 270, 90d supply, fill #2

## 2022-07-15 ENCOUNTER — Other Ambulatory Visit (HOSPITAL_COMMUNITY): Payer: Self-pay

## 2022-07-15 MED ORDER — FUSION PLUS PO CAPS
1.0000 | ORAL_CAPSULE | Freq: Every day | ORAL | 1 refills | Status: DC
  Filled 2022-07-15: qty 90, 90d supply, fill #0

## 2022-07-22 ENCOUNTER — Ambulatory Visit: Payer: Self-pay

## 2022-07-24 ENCOUNTER — Other Ambulatory Visit (HOSPITAL_COMMUNITY): Payer: Self-pay

## 2022-07-24 ENCOUNTER — Other Ambulatory Visit: Payer: Self-pay

## 2022-07-24 LAB — CUP PACEART REMOTE DEVICE CHECK
Battery Remaining Longevity: 47 mo
Battery Voltage: 2.91 V
Brady Statistic RV Percent Paced: 0.02 %
Date Time Interrogation Session: 20231229014227
HighPow Impedance: 43 Ohm
HighPow Impedance: 54 Ohm
Implantable Lead Connection Status: 753985
Implantable Lead Implant Date: 20070718
Implantable Lead Location: 753860
Implantable Pulse Generator Implant Date: 20160215
Lead Channel Impedance Value: 285 Ohm
Lead Channel Impedance Value: 342 Ohm
Lead Channel Pacing Threshold Amplitude: 1.875 V
Lead Channel Pacing Threshold Pulse Width: 0.4 ms
Lead Channel Sensing Intrinsic Amplitude: 13.375 mV
Lead Channel Sensing Intrinsic Amplitude: 13.375 mV
Lead Channel Setting Pacing Amplitude: 3.5 V
Lead Channel Setting Pacing Pulse Width: 0.7 ms
Lead Channel Setting Sensing Sensitivity: 0.3 mV

## 2022-07-27 ENCOUNTER — Other Ambulatory Visit: Payer: Self-pay

## 2022-09-14 ENCOUNTER — Telehealth (HOSPITAL_COMMUNITY): Payer: Self-pay | Admitting: Pharmacy Technician

## 2022-09-14 ENCOUNTER — Other Ambulatory Visit (HOSPITAL_COMMUNITY): Payer: Self-pay

## 2022-09-14 NOTE — Telephone Encounter (Signed)
Advanced Heart Failure Patient Advocate Encounter  Prior Authorization for Delene Loll has been approved.    Effective dates: 09/14/22 through 09/15/23  Charlann Boxer, CPhT

## 2022-09-15 ENCOUNTER — Other Ambulatory Visit (HOSPITAL_COMMUNITY): Payer: Self-pay

## 2022-09-15 ENCOUNTER — Telehealth: Payer: Self-pay | Admitting: Hematology and Oncology

## 2022-09-15 MED ORDER — SACUBITRIL-VALSARTAN 97-103 MG PO TABS: 1.0000 | ORAL_TABLET | Freq: Two times a day (BID) | ORAL | 3 refills | Status: DC

## 2022-09-15 MED ORDER — SACUBITRIL-VALSARTAN 97-103 MG PO TABS
1.0000 | ORAL_TABLET | Freq: Two times a day (BID) | ORAL | 3 refills | Status: DC
  Filled 2022-09-15: qty 180, 90d supply, fill #0
  Filled 2022-12-10: qty 180, 90d supply, fill #1
  Filled 2023-05-07: qty 180, 90d supply, fill #2
  Filled 2023-08-11 (×2): qty 180, 90d supply, fill #3

## 2022-09-15 NOTE — Telephone Encounter (Addendum)
Advanced Heart Failure Patient Advocate Encounter  Received renewal notification from [employer]. This patient has new insurance coverage and can afford copay. Prior Josem Kaufmann [phone]) has been approved, and patient has been informed of ways to obtain copay card for additional savings. New rx being sent to preferred pharmacy.  Clista Bernhardt, CPhT Rx Patient Advocate Phone: [phone]

## 2022-09-15 NOTE — Telephone Encounter (Signed)
Scheduled appt per 2/19 referral. Pt is aware of appt date and time. Pt is aware to arrive 15 mins prior to appt time and to bring and updated insurance card. Pt is aware of appt location.

## 2022-09-18 ENCOUNTER — Other Ambulatory Visit (HOSPITAL_COMMUNITY): Payer: Self-pay | Admitting: Cardiology

## 2022-09-18 ENCOUNTER — Other Ambulatory Visit (HOSPITAL_COMMUNITY): Payer: Self-pay

## 2022-09-18 MED ORDER — SPIRONOLACTONE 25 MG PO TABS
25.0000 mg | ORAL_TABLET | Freq: Every day | ORAL | 3 refills | Status: DC
  Filled 2022-09-18: qty 30, 30d supply, fill #0
  Filled 2022-10-23: qty 30, 30d supply, fill #1
  Filled 2022-12-10: qty 30, 30d supply, fill #2
  Filled 2023-01-08: qty 30, 30d supply, fill #3

## 2022-09-26 ENCOUNTER — Inpatient Hospital Stay: Payer: BC Managed Care – PPO | Attending: Hematology and Oncology | Admitting: Oncology

## 2022-09-26 ENCOUNTER — Encounter: Payer: Self-pay | Admitting: Oncology

## 2022-09-26 ENCOUNTER — Inpatient Hospital Stay: Payer: BC Managed Care – PPO

## 2022-09-26 VITALS — BP 126/81 | HR 75 | Temp 97.7°F | Resp 17 | Ht 64.25 in | Wt 225.0 lb

## 2022-09-26 DIAGNOSIS — D539 Nutritional anemia, unspecified: Secondary | ICD-10-CM

## 2022-09-26 DIAGNOSIS — D509 Iron deficiency anemia, unspecified: Secondary | ICD-10-CM | POA: Diagnosis present

## 2022-09-26 DIAGNOSIS — N938 Other specified abnormal uterine and vaginal bleeding: Secondary | ICD-10-CM | POA: Insufficient documentation

## 2022-09-26 DIAGNOSIS — Z79899 Other long term (current) drug therapy: Secondary | ICD-10-CM | POA: Diagnosis not present

## 2022-09-26 DIAGNOSIS — M791 Myalgia, unspecified site: Secondary | ICD-10-CM | POA: Diagnosis not present

## 2022-09-26 DIAGNOSIS — R232 Flushing: Secondary | ICD-10-CM | POA: Insufficient documentation

## 2022-09-26 LAB — CMP (CANCER CENTER ONLY)
ALT: 9 U/L (ref 0–44)
AST: 11 U/L — ABNORMAL LOW (ref 15–41)
Albumin: 4 g/dL (ref 3.5–5.0)
Alkaline Phosphatase: 54 U/L (ref 38–126)
Anion gap: 7 (ref 5–15)
BUN: 22 mg/dL — ABNORMAL HIGH (ref 6–20)
CO2: 27 mmol/L (ref 22–32)
Calcium: 8.9 mg/dL (ref 8.9–10.3)
Chloride: 107 mmol/L (ref 98–111)
Creatinine: 1.33 mg/dL — ABNORMAL HIGH (ref 0.44–1.00)
GFR, Estimated: 49 mL/min — ABNORMAL LOW (ref >60)
Glucose, Bld: 99 mg/dL (ref 70–99)
Potassium: 4.1 mmol/L (ref 3.5–5.1)
Sodium: 141 mmol/L (ref 135–145)
Total Bilirubin: 0.5 mg/dL (ref 0.3–1.2)
Total Protein: 8 g/dL (ref 6.5–8.1)

## 2022-09-26 LAB — RETICULOCYTES
Immature Retic Fract: 18 % — ABNORMAL HIGH (ref 2.3–15.9)
RBC.: 4.39 MIL/uL (ref 3.87–5.11)
Retic Count, Absolute: 45.7 10*3/uL (ref 19.0–186.0)
Retic Ct Pct: 1 % (ref 0.4–3.1)

## 2022-09-26 LAB — FOLATE: Folate: 37.6 ng/mL (ref >5.9)

## 2022-09-26 LAB — CBC WITH DIFFERENTIAL (CANCER CENTER ONLY)
Abs Immature Granulocytes: 0.02 10*3/uL (ref 0.00–0.07)
Basophils Absolute: 0.1 10*3/uL (ref 0.0–0.1)
Basophils Relative: 1 %
Eosinophils Absolute: 0.1 10*3/uL (ref 0.0–0.5)
Eosinophils Relative: 2 %
HCT: 33.6 % — ABNORMAL LOW (ref 36.0–46.0)
Hemoglobin: 10.1 g/dL — ABNORMAL LOW (ref 12.0–15.0)
Immature Granulocytes: 0 %
Lymphocytes Relative: 36 %
Lymphs Abs: 2.4 10*3/uL (ref 0.7–4.0)
MCH: 22.9 pg — ABNORMAL LOW (ref 26.0–34.0)
MCHC: 30.1 g/dL (ref 30.0–36.0)
MCV: 76.2 fL — ABNORMAL LOW (ref 80.0–100.0)
Monocytes Absolute: 0.5 10*3/uL (ref 0.1–1.0)
Monocytes Relative: 8 %
Neutro Abs: 3.5 10*3/uL (ref 1.7–7.7)
Neutrophils Relative %: 53 %
Platelet Count: 265 10*3/uL (ref 150–400)
RBC: 4.41 MIL/uL (ref 3.87–5.11)
RDW: 20 % — ABNORMAL HIGH (ref 11.5–15.5)
WBC Count: 6.6 10*3/uL (ref 4.0–10.5)
nRBC: 0 % (ref 0.0–0.2)

## 2022-09-26 LAB — PROTIME-INR
INR: 1 (ref 0.8–1.2)
Prothrombin Time: 13.2 seconds (ref 11.4–15.2)

## 2022-09-26 LAB — VITAMIN B12: Vitamin B-12: 486 pg/mL (ref 180–914)

## 2022-09-26 LAB — APTT: aPTT: 29 seconds (ref 24–36)

## 2022-09-26 LAB — FERRITIN: Ferritin: 3 ng/mL — ABNORMAL LOW (ref 11–307)

## 2022-09-26 NOTE — Progress Notes (Signed)
[facility] Initial Visit:  Patient Care Team: Maurice Small, MD as PCP - General (Family Medicine) Bensimhon, Shaune Pascal, MD as PCP - Cardiology (Cardiology) Sueanne Margarita, MD as PCP - Sleep Medicine (Cardiology)  CHIEF COMPLAINTS/PURPOSE OF CONSULTATION:   HISTORY OF PRESENTING ILLNESS: [name] 47 y.o. female is here because of anemia  Medical history notable for nonischemic cardiomyopathy, sleeve gastrectomy for morbid obesity, atrial fibrillation hypertension, chronic bronchitis,, breast reduction surgery  July 01, 2022: WBC 6.6 hemoglobin 9.3 MCV 72 platelet count 305; 55 segs 33 lymphs 9 monos 2 eos 1 basophil CMP notable for creatinine 1.14 September 26 2022:  [facility]  Menses occur monthly and the last one was 15 days in duration.  Menses are irregular.  Bleeding is moderate with passage of clots.  Does not have bleeding between periods.     Last pregnancy was delivered by C section in March 2014.   Does not have history of uterine fibroids/uterine abnormalities.  Has not received IV iron/ required PRBC's in the past.  Takes oral iron without benefit in Hgb but tolerates it from a GI standpoint.   Has have a normal diet  No history of postpartum hemorrhage requiring transfusion.   No history of hemorrhage postoperatively requiring transfusion.    No hematochezia, melena, hemoptysis, hematuria.  No  history of intra-articular or soft tissue bleeding No history of abnormal bleeding in family members.  Patient has symptoms of fatigue, pallor,  DOE, decreased performance status.  Patient has pica to ice but not to /starch/dirt.  Not taking antiplatelet therapy,  She is also taking Calcium supplements.    Patient had colonoscopy and EGD performed September 03 2020 which was negative per patient.   Last Gynecology visit was 2023  Review of Systems - Oncology  MEDICAL HISTORY: Past Medical History:  Diagnosis Date   Acute bronchitis and  bronchiolitis    Anemia    Iron supplements in the past   Asthma    Inhaler prn;triggered by seasonal changes mostly cold weathert;last asthma  attack years ago   Atrial fibrillation (HCC)    CHF (congestive heart failure) (HCC)    Chronic systolic CHF (congestive heart failure), NYHA class 2 ([place]) 01/2006   Has MDT Defibrillator, EF had improved by 2017 echo   Hypertension    Hypertension    Infection    UTI;not frequent;last infection was in June   Infection    Yeast;not frequent   Migraines    Can't take Procardia XL, causes migraines   Other primary cardiomyopathies    Paroxysmal ventricular tachycardia ([place])    Varicose veins 1998   Developed during last pregnancy    SURGICAL HISTORY: Past Surgical History:  Procedure Laterality Date   BREAST REDUCTION SURGERY  03/05/1994   d/t back, shoulder, and neck pain (42 F)   CARDIAC CATHETERIZATION     EF 30-35%   CARDIAC CATHETERIZATION N/A 05/04/2015   Procedure: Left Heart Cath and Coronary Angiography;  Surgeon: Belva Crome, MD;  Location: [facility];  Service: Cardiovascular;  Laterality: N/A;   CARDIAC DEFIBRILLATOR [place]   d/t preeclampsia   IMPLANTABLE CARDIOVERTER DEFIBRILLATOR (ICD) GENERATOR CHANGE N/A 09/10/2014   Procedure: ICD GENERATOR CHANGE;  Surgeon: Evans Lance, MD;  Location: [facility] CATH LAB;  Service: Cardiovascular;  Laterality: N/A;   INSERT / REPLACE / REMOVE PACEMAKER   02/10/2006   Status post implantable  cardioverter-defibrillator insertion  .Marland KitchenMarland KitchenMarland KitchenThe Medtronic Maximo VR, model [id number], single chamber   LAPAROSCOPIC GASTRIC SLEEVE RESECTION WITH HIATAL HERNIA REPAIR N/A 11/24/2016   Procedure: LAPAROSCOPIC GASTRIC SLEEVE RESECTION WITH HIATAL HERNIA REPAIR, UPPER ENDO;  Surgeon: Greer Pickerel, MD;  Location: [facility];  Service: General;  Laterality: N/A;   LAPAROSCOPIC OVARIAN CYSTECTOMY Bilateral 11/24/2016   Procedure: LAPAROSCOPIC BILATERAL OVARIAN CYSTECTOMY;  Surgeon:  Janyth Pupa, DO;  Location: [facility];  Service: Gynecology;  Laterality: Bilateral;   REDUCTION MAMMAPLASTY Bilateral     SOCIAL HISTORY: Social History   Socioeconomic History   Marital status: Divorced    Spouse name: Melike Klomp   Number of children: 1   Years of education: 15   Highest education level: Associate degree: academic program  Occupational History   Occupation: Asst. Freight forwarder    Comment: [employer]   Occupation: ACCOUNTANT    Employer: [employer]  Tobacco Use   Smoking status: Never   Smokeless tobacco: Never  Vaping Use   Vaping Use: Never used  Substance and Sexual Activity   Alcohol use: No    Comment: wine on occasions   Drug use: No   Sexual activity: Yes    Partners: Male    Birth control/protection: Condom, None  Other Topics Concern   Not on file  Social History Narrative   ** Merged History Encounter **       Social Determinants of Health   Financial Resource Strain: Not on file  Food Insecurity: Not on file  Transportation Needs: Not on file  Physical Activity: Not on file  Stress: Not on file  Social Connections: Not on file  Intimate Partner Violence: Not on file    FAMILY HISTORY Family History  Problem Relation Age of Onset   Sickle cell trait Daughter    Other Father        Varicose veins   Hypertension Father    Diabetes Father        Controlled w/ diet and exercise   Colon polyps Father        approx 3-4   Asthma Daughter    Seizures Maternal Uncle    Migraines Mother    Ulcers Mother    Rheum arthritis Mother    Colon polyps Mother        approx 2 polyps   Migraines Daughter    Migraines Cousin        Maternal   Uterine cancer Paternal Grandmother        d. 60-61   Prostate cancer Maternal Uncle        dx 69-70; surgery and rad   Lung cancer Maternal Uncle 65       d. 69-70; smoker   Lupus Maternal Aunt    Hypertension Paternal Aunt    Breast cancer Paternal Aunt    Other Daughter        blood  transfusion @ birth;platelets were low   Diabetes Maternal Aunt    Lung cancer Maternal Aunt        dx 71-72; +chemo   Diabetes Paternal Aunt        x 2   Breast cancer Paternal Aunt    Diabetes Paternal Uncle    Hypertension Other    Heart disease Other    Cancer Cousin        maternal 1st cousin d. early 73s; NOS cancer   Breast cancer Cousin        maternal 1st cousin dx 49-48  Breast cancer Other        maternal great aunt (MGM's sister) d. breast cancer in her late 98s   Kidney cancer Cousin        paternal 1st cousin dx 63s; nephrectomy; not a smoker   Ovarian cancer Paternal Aunt 29       paternal aunt (father's maternal half-sister)   Breast cancer Paternal Aunt    Ovarian cancer Paternal Aunt    Uterine cancer Paternal Aunt    Cancer Other        paternal great aunt (PGM's sister) dx NOS cancer   Cancer Other        paternal great uncle (PGM's brother) dx NOS cancer   Colon cancer Neg Hx     ALLERGIES:  is allergic to oxycodone, percocet [oxycodone-acetaminophen], hydrocodone, hydrocodone-acetaminophen, keflex [cephalexin], and oxycodone-acetaminophen.  MEDICATIONS:  Current Outpatient Medications  Medication Sig Dispense Refill   albuterol (PROVENTIL HFA;VENTOLIN HFA) 108 (90 Base) MCG/ACT inhaler Inhale 2 puffs into the lungs every 6 (six) hours as needed for wheezing or shortness of breath. Rescue inhaler 1 Inhaler 1   bisoprolol (ZEBETA) 5 MG tablet TAKE 1 TABLET AT BEDTIME (NEED APPOINTMENT FOR FURTHER REFILL) 90 tablet 3   Calcium Carbonate-Vit D-Min (QC CALCIUM-MAGNESIUM-ZINC-D3 PO) Take by mouth in the morning, at noon, and at bedtime.     cetirizine (ZYRTEC) 10 MG tablet Take 10 mg by mouth daily as needed for allergies.      hydrALAZINE (APRESOLINE) 25 MG tablet Take 1 tablet (25 mg total) by mouth 3 (three) times daily. 270 tablet 3   Iron-FA-B Cmp-C-Biot-Probiotic (FUSION PLUS) CAPS Take 1 capsule by mouth daily. 90 capsule 1   isosorbide mononitrate  (IMDUR) 30 MG 24 hr tablet Take 1 tablet (30 mg total) by mouth daily. 60 tablet 3   Multiple Vitamin (MULTIVITAMIN WITH MINERALS) TABS tablet Take 1 tablet by mouth daily.     pantoprazole (PROTONIX) 40 MG tablet Take 1 tablet (40 mg total) by mouth 2 (two) times daily before meals 60 tablet 11   potassium chloride SA (KLOR-CON M20) 20 MEQ tablet Take 1.5 tablets (30 mEq total) by mouth 2 (two) times daily. 90 tablet 3   progesterone (PROMETRIUM) 100 MG capsule Take 100 mg by mouth daily.     sacubitril-valsartan (ENTRESTO) 97-103 MG Take 1 tablet by mouth 2 (two) times daily. 180 tablet 3   spironolactone (ALDACTONE) 25 MG tablet Take 1 tablet (25 mg total) by mouth daily. 30 tablet 3   torsemide (DEMADEX) 20 MG tablet Take 1 tablet (20 mg total) by mouth daily. 30 tablet 3   No current facility-administered medications for this visit.    PHYSICAL EXAMINATION:  ECOG PERFORMANCE STATUS: 1 - Symptomatic but completely ambulatory   Vitals:   09/26/22 0830 09/26/22 0850  BP: 126/81 126/81  Pulse: 75 75  Resp: 17 17  Temp: 97.7 F (36.5 C) 97.7 F (36.5 C)  SpO2: 100% 100%    Filed Weights   09/26/22 0850  Weight: 225 lb (102.1 kg)     Physical Exam Vitals and nursing note reviewed.  Constitutional:      General: She is not in acute distress.    Appearance: Normal appearance. She is obese. She is not ill-appearing, toxic-appearing or diaphoretic.     Comments: Here alone.    HENT:     Head: Normocephalic and atraumatic.     Right Ear: External ear normal.     Left Ear: External ear normal.  Nose: Nose normal. No congestion or rhinorrhea.  Eyes:     General: No scleral icterus.    Extraocular Movements: Extraocular movements intact.     Conjunctiva/sclera: Conjunctivae normal.     Pupils: Pupils are equal, round, and reactive to light.  Cardiovascular:     Rate and Rhythm: Normal rate.     Heart sounds: No murmur heard.    No friction rub. No gallop.  Abdominal:      General: Bowel sounds are normal.     Palpations: Abdomen is soft.  Musculoskeletal:        General: No swelling, tenderness or deformity.     Cervical back: Normal range of motion and neck supple. No rigidity or tenderness.  Lymphadenopathy:     Head:     Right side of head: No submental, submandibular, tonsillar, preauricular, posterior auricular or occipital adenopathy.     Left side of head: No submental, submandibular, tonsillar, preauricular, posterior auricular or occipital adenopathy.     Cervical: No cervical adenopathy.     Right cervical: No superficial, deep or posterior cervical adenopathy.    Left cervical: No superficial, deep or posterior cervical adenopathy.     Upper Body:     Right upper body: No supraclavicular, axillary, pectoral or epitrochlear adenopathy.     Left upper body: No supraclavicular, axillary, pectoral or epitrochlear adenopathy.  Skin:    General: Skin is warm.     Coloration: Skin is not jaundiced.  Neurological:     General: No focal deficit present.     Mental Status: She is alert and oriented to person, place, and time. Mental status is at baseline.     Cranial Nerves: No cranial nerve deficit.  Psychiatric:        Mood and Affect: Mood normal.        Behavior: Behavior normal.        Thought Content: Thought content normal.        Judgment: Judgment normal.     LABORATORY DATA: I have personally reviewed the data as listed:  Clinical Support on 09/26/2022  Component Date Value Ref Range Status   Coagulation Factor VIII 09/26/2022 124  56 - 140 % Final   Ristocetin Co-factor, Plasma 09/26/2022 53  50 - 200 % Final   Comment: (NOTE) Performed At: [facility] [place], [place] [id number] Rush Farmer MD [id number]    Von Willebrand Antigen, Plasma 09/26/2022 84  50 - 200 % Final   Comment: (NOTE) This test was developed and its performance characteristics determined by [facility]. It has not been cleared  or approved by the Food and Drug Administration.    Prothrombin Time 09/26/2022 13.2  11.4 - 15.2 seconds Final   INR 09/26/2022 1.0  0.8 - 1.2 Final   Comment: (NOTE) INR goal varies based on device and disease states. Performed at [facility], [place] [address] [place], [place] [address]    aPTT 09/26/2022 29  24 - 36 seconds Final   Performed at [facility], [place] [address], [place], [place] [address]   Hgb F 09/26/2022 0.0  0.0 - 2.0 % Final   Hgb A 09/26/2022 98.1  96.4 - 98.8 % Final   Hgb A2 09/26/2022 1.9  1.8 - 3.2 % Final   Hgb S 09/26/2022 0.0  0.0 % Final   Interpretation, Hgb Fract 09/26/2022 Comment   Final   Comment: (NOTE) Normal hemoglobin present; no hemoglobin variant or beta  thalassemia identified. Note: Alpha thalassemia may not be detected by the Hgb Fractionation Cascade panel. If alpha thalassemia is suspected, [facility] offers Alpha-Thalassemia DNA Analysis [phone]). Performed At: [facility] [place], [place] [id number] Rush Farmer MD [id number]    Kappa free light chain 09/26/2022 21.1 (H)  3.3 - 19.4 mg/L Final   Lambda free light chains 09/26/2022 34.2 (H)  5.7 - 26.3 mg/L Final   Kappa, lambda light chain ratio 09/26/2022 0.62  0.26 - 1.65 Final   Comment: (NOTE) Performed At: [facility] [place], [place] [id number] Rush Farmer MD [id number]    IgG (Immunoglobin G), Serum 09/26/2022 1,757 (H)  586 - 1,602 mg/dL Final   IgA 09/26/2022 169  87 - 352 mg/dL Final   IgM (Immunoglobulin M), Srm 09/26/2022 62  26 - 217 mg/dL Final   Total Protein ELP 09/26/2022 7.3  6.0 - 8.5 g/dL Corrected   Albumin SerPl Elph-Mcnc 09/26/2022 3.5  2.9 - 4.4 g/dL Corrected   Alpha 1 09/26/2022 0.2  0.0 - 0.4 g/dL Corrected   Alpha2 Glob SerPl Elph-Mcnc 09/26/2022 0.8  0.4 - 1.0 g/dL Corrected   B-Globulin SerPl Elph-Mcnc 09/26/2022 1.1  0.7 - 1.3 g/dL Corrected   Gamma Glob  SerPl Elph-Mcnc 09/26/2022 1.6  0.4 - 1.8 g/dL Corrected   M Protein SerPl Elph-Mcnc 09/26/2022 0.8 (H)  Not Observed g/dL Corrected   Globulin, Total 09/26/2022 3.8  2.2 - 3.9 g/dL Corrected   Albumin/Glob SerPl 09/26/2022 1.0  0.7 - 1.7 Corrected   IFE 1 09/26/2022 Comment (A)   Corrected   Comment: (NOTE) Immunofixation shows IgG monoclonal protein with lambda light chain specificity.    Please Note 09/26/2022 Comment   Corrected   Comment: (NOTE) Protein electrophoresis scan will follow via computer, mail, or courier delivery. Performed At: [facility] [place], [place] [id number] Rush Farmer MD [id number]    Retic Ct Pct 09/26/2022 1.0  0.4 - 3.1 % Final   RBC. 09/26/2022 4.39  3.87 - 5.11 MIL/uL Final   Retic Count, Absolute 09/26/2022 45.7  19.0 - 186.0 K/uL Final   Immature Retic Fract 09/26/2022 18.0 (H)  2.3 - 15.9 % Final   Performed at [facility], [place] [address] [place], [place] [address]   Vitamin B-12 09/26/2022 486  180 - 914 pg/mL Final   Comment: (NOTE) This assay is not validated for testing neonatal or myeloproliferative syndrome specimens for Vitamin B12 levels. Performed at [facility], [place] [address] [place],  [address]    Haptoglobin 09/26/2022 203  42 - 296 mg/dL Final   Comment: (NOTE) Performed At: [facility] [place], [place] [id number] Rush Farmer MD [id number]    Folate 09/26/2022 37.6  >5.9 ng/mL Final   Comment: RESULT CONFIRMED BY MANUAL DILUTION Performed at [place] [address] [place], [place] [address]    Ferritin 09/26/2022 3 (L)  11 - 307 ng/mL Final   Performed at [place] [address] [place], [place] [address]   Sodium 09/26/2022 141  135 - 145 mmol/L Final   Potassium 09/26/2022 4.1  3.5 - 5.1 mmol/L Final   Chloride 09/26/2022 107  98 - 111 mmol/L Final   CO2  09/26/2022 27  22 - 32 mmol/L Final   Glucose, Bld 09/26/2022 99  70 - 99 mg/dL Final   Glucose reference range applies only to samples taken after fasting for  at least 8 hours.   BUN 09/26/2022 22 (H)  6 - 20 mg/dL Final   Creatinine 09/26/2022 1.33 (H)  0.44 - 1.00 mg/dL Final   Calcium 09/26/2022 8.9  8.9 - 10.3 mg/dL Final   Total Protein 09/26/2022 8.0  6.5 - 8.1 g/dL Final   Albumin 09/26/2022 4.0  3.5 - 5.0 g/dL Final   AST 09/26/2022 11 (L)  15 - 41 U/L Final   ALT 09/26/2022 9  0 - 44 U/L Final   Alkaline Phosphatase 09/26/2022 54  38 - 126 U/L Final   Total Bilirubin 09/26/2022 0.5  0.3 - 1.2 mg/dL Final   GFR, Estimated 09/26/2022 49 (L)  >60 mL/min Final   Comment: (NOTE) Calculated using the CKD-EPI Creatinine Equation (2021)    Anion gap 09/26/2022 7  5 - 15 Final   Performed at [facility], [place] [address], [place], [place] [address]   WBC Count 09/26/2022 6.6  4.0 - 10.5 K/uL Final   RBC 09/26/2022 4.41  3.87 - 5.11 MIL/uL Final   Hemoglobin 09/26/2022 10.1 (L)  12.0 - 15.0 g/dL Final   Comment: Reticulocyte Hemoglobin testing may be clinically indicated, consider ordering this additional test [id number]    HCT 09/26/2022 33.6 (L)  36.0 - 46.0 % Final   MCV 09/26/2022 76.2 (L)  80.0 - 100.0 fL Final   MCH 09/26/2022 22.9 (L)  26.0 - 34.0 pg Final   MCHC 09/26/2022 30.1  30.0 - 36.0 g/dL Final   RDW 09/26/2022 20.0 (H)  11.5 - 15.5 % Final   Platelet Count 09/26/2022 265  150 - 400 K/uL Final   nRBC 09/26/2022 0.0  0.0 - 0.2 % Final   Neutrophils Relative % 09/26/2022 53  % Final   Neutro Abs 09/26/2022 3.5  1.7 - 7.7 K/uL Final   Lymphocytes Relative 09/26/2022 36  % Final   Lymphs Abs 09/26/2022 2.4  0.7 - 4.0 K/uL Final   Monocytes Relative 09/26/2022 8  % Final   Monocytes Absolute 09/26/2022 0.5  0.1 - 1.0 K/uL Final   Eosinophils Relative 09/26/2022 2  % Final   Eosinophils Absolute 09/26/2022 0.1  0.0 - 0.5 K/uL Final   Basophils  Relative 09/26/2022 1  % Final   Basophils Absolute 09/26/2022 0.1  0.0 - 0.1 K/uL Final   Immature Granulocytes 09/26/2022 0  % Final   Abs Immature Granulocytes 09/26/2022 0.02  0.00 - 0.07 K/uL Final   Performed at [facility], [place] [address] [place],  [address]   Interpretation 09/26/2022 Note   Final   Comment: (NOTE) ------------------------------- COAGULATION: VON WILLEBRAND FACTOR ASSESSMENT CURRENT RESULTS ASSESSMENT The VWF:Ag is normal. The VWF:RCo is normal. The FVIII is normal. VON WILLEBRAND FACTOR ASSESSMENT CURRENT RESULTS INTERPRETATION - These results are not consistent with a diagnosis of VWD according to the current NHLBI guideline. VON WILLEBRAND FACTOR ASSESSMENT - Results may be falsely elevated and possibly falsely normal as VWF and FVIII may increase in pregnancy, in samples drawn from patients (particularly children) who are visibly stressed at the time of phlebotomy, as acute phase reactants, or in response to certain drug therapies such as desmopressin. Repeat testing may be necessary before excluding a diagnosis of VWD especially if the clinical suspicion is high for an underlying bleeding disorder. The setting for phlebotomy should be as calm as possible and patients should be encouraged to sit quietly prior to the blood draw. VON [place] FA  CTOR ASSESSMENT DEFINITIONS - VWD - von Willebrand disease; VWF - von Willebrand factor; VWF:Ag - VWF antigen; VWF:RCo - VWF ristocetin cofactor activity; FVIII - factor VIII activity. MEDICAL DIRECTOR: For questions regarding panel interpretation, please contact Jake Bathe, M.D. at [facility] at [phone]. ------------------------------- DISCLAIMER These assessments and interpretations are provided as a convenience in support of the physician-patient relationship and are not intended to replace the physician's  clinical judgment. They are derived from national guidelines in addition to other evidence and expert opinion. The clinician should consider this information within the context of clinical opinion and the individual patient. SEE GUIDANCE FOR VON WILLEBRAND FACTOR ASSESSMENT: (1) The National Heart, Lung and Blood Institute. The Diagnosis, Evaluation and Management of von Willebrand Disease. Janeal Holmes, MD: [place]                          h Publication A999333. [id number]. Available at vSpecials.com.pt. (2) Daryl Eastern et al. [place] J Hematol. 2009; 84(6):366-370. (3) [place]. 2004;10(3):199-217. (4) Pasi KJ et al. Haemophilia. 2004; 10(3):218-231. Performed At: [facility] [address] [place], [place] [id number] Eino Farber MD [id number]     RADIOGRAPHIC STUDIES: I have personally reviewed the radiological images as listed and agree with the findings in the report  No results found.  ASSESSMENT/PLAN  Patient is a year old female with symptomatic microcytic anemia presumed to be secondary to dysfunctional uterine bleeding  Anemia:  Most likely etiology is iron deficiency anemia owing to dysfunctional uterine bleeding/ exacerbated by high demand owing to prior pregnancies.  Will obtain CBC with diff, CMP, Ferritin, B12, folate, retic count,  DAT, Haptoglobin Dysfunctional uterine bleeding:  This is characterized by menses that are prolonged, irregular as well as by heavy bleeding with passage of clots.  Will evaluate for possible bleeding disorder with PT, PTT, Fibrinogen, von Willebrand screen.  Refer to gynecology for evaluation and management  Therapeutics:  Since patient has symptomatic anemia with Hgb < 10  and has not tolerated/been compliant with oral iron will arrange for IV iron replacement.   Given the severe symptoms we prefer to replete iron stores in one or two visits rather than over the course of  several months.  In addition ongoing blood loss exceeds the capacity of oral iron to meet needs. A discussion regarding risks was had with the patient.  IV iron has the potential to cause allergic reactions, including potentially life-threatening anaphylaxis.   IV iron may be associated with non-allergic infusion reactions including self-limiting urticaria, palpitations, dizziness, and neck and back spasm; generally, these occur in <1 percent of individuals and do not progress to more serious reactions. The non-allergic reaction consisting of flushing of the face and myalgias of the chest and back.   After discussion of the risks and benefits of IV iron therapy patient has elected to proceed with parenteral iron therapy.       Cancer Staging  No matching staging information was found for the patient.   No problem-specific Assessment & Plan notes found for this encounter.    Orders Placed This Encounter  Procedures   CBC with Differential ([facility] Only)    Standing Status:   Future    Number of Occurrences:   1    Standing Expiration Date:   09/26/2023   CMP ([facility] only)    Standing Status:   Future    Number of Occurrences:  1    Standing Expiration Date:   09/26/2023   Ferritin    Standing Status:   Future    Number of Occurrences:   1    Standing Expiration Date:   09/26/2023   Folate    Standing Status:   Future    Number of Occurrences:   1    Standing Expiration Date:   09/26/2023   Haptoglobin    Standing Status:   Future    Number of Occurrences:   1    Standing Expiration Date:   09/26/2023   Vitamin B12    Standing Status:   Future    Number of Occurrences:   1    Standing Expiration Date:   09/26/2023   Reticulocytes    Standing Status:   Future    Number of Occurrences:   1    Standing Expiration Date:   09/26/2023   Multiple Myeloma Panel (SPEP&IFE w/QIG)    Standing Status:   Future    Number of Occurrences:   1    Standing Expiration Date:   09/26/2023    Kappa/lambda light chains    Standing Status:   Future    Number of Occurrences:   1    Standing Expiration Date:   09/26/2023   Hgb Fractionation Cascade    Standing Status:   Future    Number of Occurrences:   1    Standing Expiration Date:   09/26/2023   APTT    Standing Status:   Future    Number of Occurrences:   1    Standing Expiration Date:   09/26/2023   Protime-INR    Standing Status:   Future    Number of Occurrences:   1    Standing Expiration Date:   09/26/2023   Von Willebrand panel    Standing Status:   Future    Number of Occurrences:   1    Standing Expiration Date:   09/26/2023   Ambulatory referral to Gastroenterology    Referral Priority:   Routine    Referral Type:   Consultation    Referral Reason:   Specialty Services Required    Number of Visits Requested:   1   Ambulatory referral to Gynecology    Referral Priority:   Routine    Referral Type:   Consultation    Referral Reason:   Specialty Services Required    Requested Specialty:   Gynecology    Number of Visits Requested:   1   Obtain medical records    Please get results of EGD and Colonoscopy from [facility]    45  minutes was spent in patient care.  This included time spent preparing to see the patient (e.g., review of tests), obtaining and/or reviewing separately obtained history, counseling and educating the patient/family/caregiver, ordering medications, tests, or procedures; documenting clinical information in the electronic or other health record, independently interpreting results and communicating results to the patient/family/caregiver as well as coordination of care.       All questions were answered. The patient knows to call the clinic with any problems, questions or concerns.  This note was electronically signed.    Barbee Cough, MD  10/06/2022 5:13 PM

## 2022-09-28 LAB — KAPPA/LAMBDA LIGHT CHAINS
Kappa free light chain: 21.1 mg/L — ABNORMAL HIGH (ref 3.3–19.4)
Kappa, lambda light chain ratio: 0.62 (ref 0.26–1.65)
Lambda free light chains: 34.2 mg/L — ABNORMAL HIGH (ref 5.7–26.3)

## 2022-09-28 LAB — HAPTOGLOBIN: Haptoglobin: 203 mg/dL (ref 42–296)

## 2022-09-30 LAB — HGB FRACTIONATION CASCADE
Hgb A2: 1.9 % (ref 1.8–3.2)
Hgb A: 98.1 % (ref 96.4–98.8)
Hgb F: 0 % (ref 0.0–2.0)
Hgb S: 0 %

## 2022-10-02 LAB — MULTIPLE MYELOMA PANEL, SERUM
Albumin SerPl Elph-Mcnc: 3.5 g/dL (ref 2.9–4.4)
Albumin/Glob SerPl: 1 (ref 0.7–1.7)
Alpha 1: 0.2 g/dL (ref 0.0–0.4)
Alpha2 Glob SerPl Elph-Mcnc: 0.8 g/dL (ref 0.4–1.0)
B-Globulin SerPl Elph-Mcnc: 1.1 g/dL (ref 0.7–1.3)
Gamma Glob SerPl Elph-Mcnc: 1.6 g/dL (ref 0.4–1.8)
Globulin, Total: 3.8 g/dL (ref 2.2–3.9)
IgA: 169 mg/dL (ref 87–352)
IgG (Immunoglobin G), Serum: 1757 mg/dL — ABNORMAL HIGH (ref 586–1602)
IgM (Immunoglobulin M), Srm: 62 mg/dL (ref 26–217)
M Protein SerPl Elph-Mcnc: 0.8 g/dL — ABNORMAL HIGH
Total Protein ELP: 7.3 g/dL (ref 6.0–8.5)

## 2022-10-03 LAB — VON WILLEBRAND PANEL
Coagulation Factor VIII: 124 % (ref 56–140)
Ristocetin Co-factor, Plasma: 53 % (ref 50–200)
Von Willebrand Antigen, Plasma: 84 % (ref 50–200)

## 2022-10-03 LAB — COAG STUDIES INTERP REPORT

## 2022-10-06 ENCOUNTER — Encounter: Payer: Self-pay | Admitting: Oncology

## 2022-10-06 DIAGNOSIS — N938 Other specified abnormal uterine and vaginal bleeding: Secondary | ICD-10-CM | POA: Insufficient documentation

## 2022-10-09 ENCOUNTER — Inpatient Hospital Stay: Payer: BC Managed Care – PPO

## 2022-10-09 VITALS — BP 112/77 | HR 64 | Temp 98.7°F | Resp 18

## 2022-10-09 DIAGNOSIS — D539 Nutritional anemia, unspecified: Secondary | ICD-10-CM

## 2022-10-09 DIAGNOSIS — D509 Iron deficiency anemia, unspecified: Secondary | ICD-10-CM | POA: Diagnosis not present

## 2022-10-09 MED ORDER — ACETAMINOPHEN 325 MG PO TABS
650.0000 mg | ORAL_TABLET | Freq: Once | ORAL | Status: AC
  Administered 2022-10-09: 650 mg via ORAL
  Filled 2022-10-09: qty 2

## 2022-10-09 MED ORDER — LORATADINE 10 MG PO TABS
10.0000 mg | ORAL_TABLET | Freq: Once | ORAL | Status: AC
  Administered 2022-10-09: 10 mg via ORAL
  Filled 2022-10-09: qty 1

## 2022-10-09 MED ORDER — SODIUM CHLORIDE 0.9 % IV SOLN: Freq: Once | INTRAVENOUS | Status: AC

## 2022-10-09 MED ORDER — SODIUM CHLORIDE 0.9 % IV SOLN
510.0000 mg | Freq: Once | INTRAVENOUS | Status: AC
  Administered 2022-10-09: 510 mg via INTRAVENOUS
  Filled 2022-10-09: qty 510

## 2022-10-09 NOTE — Patient Instructions (Signed)

## 2022-10-16 ENCOUNTER — Inpatient Hospital Stay: Payer: BC Managed Care – PPO

## 2022-10-16 VITALS — BP 122/65 | HR 75 | Temp 98.0°F | Resp 18

## 2022-10-16 DIAGNOSIS — D509 Iron deficiency anemia, unspecified: Secondary | ICD-10-CM | POA: Diagnosis not present

## 2022-10-16 DIAGNOSIS — D539 Nutritional anemia, unspecified: Secondary | ICD-10-CM

## 2022-10-16 MED ORDER — LORATADINE 10 MG PO TABS
10.0000 mg | ORAL_TABLET | Freq: Once | ORAL | Status: AC
  Administered 2022-10-16: 10 mg via ORAL
  Filled 2022-10-16: qty 1

## 2022-10-16 MED ORDER — SODIUM CHLORIDE 0.9 % IV SOLN
510.0000 mg | Freq: Once | INTRAVENOUS | Status: AC
  Administered 2022-10-16: 510 mg via INTRAVENOUS
  Filled 2022-10-16: qty 510

## 2022-10-16 MED ORDER — SODIUM CHLORIDE 0.9 % IV SOLN: Freq: Once | INTRAVENOUS | Status: AC

## 2022-10-16 MED ORDER — ACETAMINOPHEN 325 MG PO TABS
650.0000 mg | ORAL_TABLET | Freq: Once | ORAL | Status: AC
  Administered 2022-10-16: 650 mg via ORAL
  Filled 2022-10-16: qty 2

## 2022-10-16 NOTE — Progress Notes (Signed)
Patient declined to stay for 30 minute post infusion observation period. VSS at time of discharge. Ambulated independently to lobby.

## 2022-10-16 NOTE — Patient Instructions (Signed)

## 2022-10-21 ENCOUNTER — Ambulatory Visit (INDEPENDENT_AMBULATORY_CARE_PROVIDER_SITE_OTHER): Payer: BC Managed Care – PPO

## 2022-10-21 DIAGNOSIS — I428 Other cardiomyopathies: Secondary | ICD-10-CM

## 2022-10-21 LAB — CUP PACEART REMOTE DEVICE CHECK
Battery Remaining Longevity: 44 mo
Battery Voltage: 2.97 V
Brady Statistic RV Percent Paced: 0.03 %
Date Time Interrogation Session: 20240327001603
HighPow Impedance: 45 Ohm
HighPow Impedance: 54 Ohm
Implantable Lead Connection Status: 753985
Implantable Lead Implant Date: 20070718
Implantable Lead Location: 753860
Implantable Pulse Generator Implant Date: 20160215
Lead Channel Impedance Value: 285 Ohm
Lead Channel Impedance Value: 304 Ohm
Lead Channel Pacing Threshold Amplitude: 1.25 V
Lead Channel Pacing Threshold Pulse Width: 0.4 ms
Lead Channel Sensing Intrinsic Amplitude: 16.625 mV
Lead Channel Sensing Intrinsic Amplitude: 16.625 mV
Lead Channel Setting Pacing Amplitude: 3.5 V
Lead Channel Setting Pacing Pulse Width: 0.7 ms
Lead Channel Setting Sensing Sensitivity: 0.3 mV

## 2022-10-23 ENCOUNTER — Inpatient Hospital Stay: Payer: BC Managed Care – PPO

## 2022-10-23 ENCOUNTER — Other Ambulatory Visit: Payer: Self-pay

## 2022-10-23 ENCOUNTER — Other Ambulatory Visit (HOSPITAL_COMMUNITY): Payer: Self-pay

## 2022-10-23 ENCOUNTER — Inpatient Hospital Stay (HOSPITAL_BASED_OUTPATIENT_CLINIC_OR_DEPARTMENT_OTHER): Payer: BC Managed Care – PPO | Admitting: Oncology

## 2022-10-23 ENCOUNTER — Other Ambulatory Visit (HOSPITAL_COMMUNITY): Payer: Self-pay | Admitting: Cardiology

## 2022-10-23 ENCOUNTER — Encounter: Payer: Self-pay | Admitting: Oncology

## 2022-10-23 VITALS — BP 123/80 | HR 70 | Temp 98.3°F | Resp 18 | Ht 64.25 in | Wt 222.4 lb

## 2022-10-23 DIAGNOSIS — D539 Nutritional anemia, unspecified: Secondary | ICD-10-CM

## 2022-10-23 DIAGNOSIS — D509 Iron deficiency anemia, unspecified: Secondary | ICD-10-CM | POA: Diagnosis not present

## 2022-10-23 DIAGNOSIS — D472 Monoclonal gammopathy: Secondary | ICD-10-CM | POA: Diagnosis not present

## 2022-10-23 DIAGNOSIS — N938 Other specified abnormal uterine and vaginal bleeding: Secondary | ICD-10-CM

## 2022-10-23 DIAGNOSIS — Z9884 Bariatric surgery status: Secondary | ICD-10-CM

## 2022-10-23 MED ORDER — TORSEMIDE 20 MG PO TABS
20.0000 mg | ORAL_TABLET | Freq: Every day | ORAL | 3 refills | Status: DC
  Filled 2022-10-23: qty 30, 30d supply, fill #0
  Filled 2022-12-10: qty 30, 30d supply, fill #1
  Filled 2023-01-08: qty 30, 30d supply, fill #2
  Filled 2023-02-15: qty 30, 30d supply, fill #3

## 2022-10-23 NOTE — Progress Notes (Signed)
[facility] Initial Visit:  Patient Care Team: Maurice Small, MD as PCP - General (Family Medicine) Bensimhon, Shaune Pascal, MD as PCP - Cardiology (Cardiology) Sueanne Margarita, MD as PCP - Sleep Medicine (Cardiology)  CHIEF COMPLAINTS/PURPOSE OF CONSULTATION:   HISTORY OF PRESENTING ILLNESS: [name] 47 y.o. female is here because of anemia  Medical history notable for nonischemic cardiomyopathy, sleeve gastrectomy for morbid obesity, atrial fibrillation hypertension, chronic bronchitis,, breast reduction surgery  July 01, 2022: WBC 6.6 hemoglobin 9.3 MCV 72 platelet count 305; 55 segs 33 lymphs 9 monos 2 eos 1 basophil CMP notable for creatinine 1.14 September 26 2022:  [facility]  Menses occur monthly and the last one was 15 days in duration.  Menses are irregular.  Bleeding is moderate with passage of clots.  Does not have bleeding between periods.     Last pregnancy was delivered by C section in March 2014.   Does not have history of uterine fibroids/uterine abnormalities.  Has not received IV iron/ required PRBC's in the past.  Takes oral iron without benefit in Hgb but tolerates it from a GI standpoint.   Has have a normal diet  No history of postpartum hemorrhage requiring transfusion.   No history of hemorrhage postoperatively requiring transfusion.    No hematochezia, melena, hemoptysis, hematuria.  No  history of intra-articular or soft tissue bleeding No history of abnormal bleeding in family members.  Patient has symptoms of fatigue, pallor,  DOE, decreased performance status.  Patient has pica to ice but not to /starch/dirt.  Not taking antiplatelet therapy,  She is also taking Calcium supplements.    Patient had colonoscopy and EGD performed September 03 2020 which was negative per patient.   Last Gynecology visit was 2023   WBC 6.6 hemoglobin 10.1 MCV 76 platelet count 265; 53 segs 36 lymphs 8 monos 2 eos 1 basophil reticulocyte 1%  hemoglobin electrophoresis normal adult pattern SPEP with IEP demonstrated 0.8 g/dL of IgG lambda monoclonal protein.  Serum free kappa 21.1 lambda 34.2 with a kappa lambda 0.62 IgG 1756 IgA 169 IgM 62 INR 1.0 PTT 29 Factor VIII 124 von Willebrand factor antigen 84 ristocetin cofactor 53 Ferritin 3 folate 37.6 B12 486 CMP notable for creatinine 1.33  October 09, 2022: Feraheme 510 mg October 16, 2022: Feraheme 510 mg  October 23 2022:  Scheduled follow up for management of anemia.  Reviewed results of labs with patient.  Ice pica has improved this week.  Still has some fatigue To see Gynecology on January 05 2023  Review of Systems - Oncology  MEDICAL HISTORY: Past Medical History:  Diagnosis Date   Acute bronchitis and bronchiolitis    Anemia    Iron supplements in the past   Asthma    Inhaler prn;triggered by seasonal changes mostly cold weathert;last asthma  attack years ago   Atrial fibrillation (HCC)    CHF (congestive heart failure) (HCC)    Chronic systolic CHF (congestive heart failure), NYHA class 2 ([place]) 01/2006   Has MDT Defibrillator, EF had improved by 2017 echo   Hypertension    Hypertension    Infection    UTI;not frequent;last infection was in June   Infection    Yeast;not frequent   Migraines    Can't take Procardia XL, causes migraines   Other primary cardiomyopathies    Paroxysmal ventricular tachycardia ([place])    Varicose veins 1998   Developed during last pregnancy    SURGICAL  HISTORY: Past Surgical History:  Procedure Laterality Date   BREAST REDUCTION SURGERY  03/05/1994   d/t back, shoulder, and neck pain (42 F)   CARDIAC CATHETERIZATION     EF 30-35%   CARDIAC CATHETERIZATION N/A 05/04/2015   Procedure: Left Heart Cath and Coronary Angiography;  Surgeon: Belva Crome, MD;  Location: [facility];  Service: Cardiovascular;  Laterality: N/A;   CARDIAC DEFIBRILLATOR [place]   d/t preeclampsia   IMPLANTABLE  CARDIOVERTER DEFIBRILLATOR (ICD) GENERATOR CHANGE N/A 09/10/2014   Procedure: ICD GENERATOR CHANGE;  Surgeon: Evans Lance, MD;  Location: [facility] CATH [facility];  Service: Cardiovascular;  Laterality: N/A;   INSERT / REPLACE / REMOVE PACEMAKER   02/10/2006   Status post implantable cardioverter-defibrillator insertion  .Marland KitchenMarland KitchenMarland KitchenThe Medtronic Maximo VR, model [id number], single chamber   LAPAROSCOPIC GASTRIC SLEEVE RESECTION WITH HIATAL HERNIA REPAIR N/A 11/24/2016   Procedure: LAPAROSCOPIC GASTRIC SLEEVE RESECTION WITH HIATAL HERNIA REPAIR, UPPER ENDO;  Surgeon: Greer Pickerel, MD;  Location: [facility];  Service: General;  Laterality: N/A;   LAPAROSCOPIC OVARIAN CYSTECTOMY Bilateral 11/24/2016   Procedure: LAPAROSCOPIC BILATERAL OVARIAN CYSTECTOMY;  Surgeon: Janyth Pupa, DO;  Location: [facility];  Service: Gynecology;  Laterality: Bilateral;   REDUCTION MAMMAPLASTY Bilateral     SOCIAL HISTORY: Social History   Socioeconomic History   Marital status: Divorced    Spouse name: Velna Iadarola   Number of children: 1   Years of education: 15   Highest education level: Associate degree: academic program  Occupational History   Occupation: Asst. Freight forwarder    Comment: [employer]   Occupation: ACCOUNTANT    Employer: [employer]  Tobacco Use   Smoking status: Never   Smokeless tobacco: Never  Vaping Use   Vaping Use: Never used  Substance and Sexual Activity   Alcohol use: No    Comment: wine on occasions   Drug use: No   Sexual activity: Yes    Partners: Male    Birth control/protection: Condom, None  Other Topics Concern   Not on file  Social History Narrative   ** Merged History Encounter **       Social Determinants of Health   Financial Resource Strain: Not on file  Food Insecurity: Not on file  Transportation Needs: Not on file  Physical Activity: Not on file  Stress: Not on file  Social Connections: Not on file  Intimate Partner Violence: Not on file    FAMILY HISTORY Family History   Problem Relation Age of Onset   Sickle cell trait Daughter    Other Father        Varicose veins   Hypertension Father    Diabetes Father        Controlled w/ diet and exercise   Colon polyps Father        approx 3-4   Asthma Daughter    Seizures Maternal Uncle    Migraines Mother    Ulcers Mother    Rheum arthritis Mother    Colon polyps Mother        approx 2 polyps   Migraines Daughter    Migraines Cousin        Maternal   Uterine cancer Paternal Grandmother        d. 60-61   Prostate cancer Maternal Uncle        dx 69-70; surgery and rad   Lung cancer Maternal Uncle 65       d. 69-70;  smoker   Lupus Maternal Aunt    Hypertension Paternal Aunt    Breast cancer Paternal Aunt    Other Daughter        blood transfusion @ birth;platelets were low   Diabetes Maternal Aunt    Lung cancer Maternal Aunt        dx 71-72; +chemo   Diabetes Paternal Aunt        x 2   Breast cancer Paternal Aunt    Diabetes Paternal Uncle    Hypertension Other    Heart disease Other    Cancer Cousin        maternal 1st cousin d. early 17s; NOS cancer   Breast cancer Cousin        maternal 1st cousin dx 76-48   Breast cancer Other        maternal great aunt (MGM's sister) d. breast cancer in her late 15s   Kidney cancer Cousin        paternal 1st cousin dx 44s; nephrectomy; not a smoker   Ovarian cancer Paternal Aunt 78       paternal aunt (father's maternal half-sister)   Breast cancer Paternal Aunt    Ovarian cancer Paternal Aunt    Uterine cancer Paternal Aunt    Cancer Other        paternal great aunt (PGM's sister) dx NOS cancer   Cancer Other        paternal great uncle (PGM's brother) dx NOS cancer   Colon cancer Neg Hx     ALLERGIES:  is allergic to oxycodone, percocet [oxycodone-acetaminophen], hydrocodone, hydrocodone-acetaminophen, keflex [cephalexin], and oxycodone-acetaminophen.  MEDICATIONS:  Current Outpatient Medications  Medication Sig Dispense Refill    albuterol (PROVENTIL HFA;VENTOLIN HFA) 108 (90 Base) MCG/ACT inhaler Inhale 2 puffs into the lungs every 6 (six) hours as needed for wheezing or shortness of breath. Rescue inhaler 1 Inhaler 1   bisoprolol (ZEBETA) 5 MG tablet TAKE 1 TABLET AT BEDTIME (NEED APPOINTMENT FOR FURTHER REFILL) 90 tablet 3   Calcium Carbonate-Vit D-Min (QC CALCIUM-MAGNESIUM-ZINC-D3 PO) Take by mouth in the morning, at noon, and at bedtime.     cetirizine (ZYRTEC) 10 MG tablet Take 10 mg by mouth daily as needed for allergies.      hydrALAZINE (APRESOLINE) 25 MG tablet Take 1 tablet (25 mg total) by mouth 3 (three) times daily. 270 tablet 3   isosorbide mononitrate (IMDUR) 30 MG 24 hr tablet Take 1 tablet (30 mg total) by mouth daily. 60 tablet 3   Multiple Vitamin (MULTIVITAMIN WITH MINERALS) TABS tablet Take 1 tablet by mouth daily.     pantoprazole (PROTONIX) 40 MG tablet Take 1 tablet (40 mg total) by mouth 2 (two) times daily before meals 60 tablet 11   potassium chloride SA (KLOR-CON M20) 20 MEQ tablet Take 1.5 tablets (30 mEq total) by mouth 2 (two) times daily. 90 tablet 3   progesterone (PROMETRIUM) 100 MG capsule Take 100 mg by mouth daily.     sacubitril-valsartan (ENTRESTO) 97-103 MG Take 1 tablet by mouth 2 (two) times daily. 180 tablet 3   spironolactone (ALDACTONE) 25 MG tablet Take 1 tablet (25 mg total) by mouth daily. 30 tablet 3   torsemide (DEMADEX) 20 MG tablet Take 1 tablet (20 mg total) by mouth daily. 30 tablet 3   No current facility-administered medications for this visit.    PHYSICAL EXAMINATION:  ECOG PERFORMANCE STATUS: 1 - Symptomatic but completely ambulatory   Vitals:   10/23/22 1517  BP:  123/80  Pulse: 70  Resp: 18  Temp: 98.3 F (36.8 C)  SpO2: 100%    Filed Weights   10/23/22 1517  Weight: 222 lb 6.4 oz (100.9 kg)     Physical Exam Vitals and nursing note reviewed.  Constitutional:      General: She is not in acute distress.    Appearance: Normal appearance. She  is obese. She is not ill-appearing, toxic-appearing or diaphoretic.     Comments: Here alone.    HENT:     Head: Normocephalic and atraumatic.     Right Ear: External ear normal.     Left Ear: External ear normal.     Nose: Nose normal. No congestion or rhinorrhea.  Eyes:     General: No scleral icterus.    Extraocular Movements: Extraocular movements intact.     Conjunctiva/sclera: Conjunctivae normal.     Pupils: Pupils are equal, round, and reactive to light.  Cardiovascular:     Rate and Rhythm: Normal rate.     Heart sounds: No murmur heard.    No friction rub. No gallop.  Abdominal:     General: Bowel sounds are normal.     Palpations: Abdomen is soft.  Musculoskeletal:        General: No swelling, tenderness or deformity.     Cervical back: Normal range of motion and neck supple. No rigidity or tenderness.  Lymphadenopathy:     Head:     Right side of head: No submental, submandibular, tonsillar, preauricular, posterior auricular or occipital adenopathy.     Left side of head: No submental, submandibular, tonsillar, preauricular, posterior auricular or occipital adenopathy.     Cervical: No cervical adenopathy.     Right cervical: No superficial, deep or posterior cervical adenopathy.    Left cervical: No superficial, deep or posterior cervical adenopathy.     Upper Body:     Right upper body: No supraclavicular, axillary, pectoral or epitrochlear adenopathy.     Left upper body: No supraclavicular, axillary, pectoral or epitrochlear adenopathy.  Skin:    General: Skin is warm.     Coloration: Skin is not jaundiced.  Neurological:     General: No focal deficit present.     Mental Status: She is alert and oriented to person, place, and time. Mental status is at baseline.     Cranial Nerves: No cranial nerve deficit.  Psychiatric:        Mood and Affect: Mood normal.        Behavior: Behavior normal.        Thought Content: Thought content normal.        Judgment:  Judgment normal.     LABORATORY DATA: I have personally reviewed the data as listed:  Appointment on 10/21/2022  Component Date Value Ref Range Status   Date Time Interrogation Session 10/21/2022 [id number]   Final   Pulse Generator Manufacturer 10/21/2022 MERM   Final   Pulse Gen Model 10/21/2022 [id number] XT VR   Final   Pulse Gen Serial Number 10/21/2022 [id number]   Final   Clinic Name 10/21/2022 [facility]   Final   Implantable Pulse Generator Type 10/21/2022 Implantable Cardiac Defibulator   Final   Implantable Pulse Generator Implan* 10/21/2022 [id number]   Final   Implantable Lead Manufacturer 10/21/2022 MERM   Final   Implantable Lead Model 10/21/2022 Unknown   Final   Implantable Lead Serial Number 10/21/2022 [id number]   Final   Implantable Lead Implant Date 10/21/2022 [id number]  Final   Implantable Lead Location 10/21/2022 [id number]   Final   Implantable Lead Connection Status 10/21/2022 [id number]   Final   Lead Channel Setting Sensing Sensi* 10/21/2022 0.3  mV Final   Lead Channel Setting Pacing Pulse * 10/21/2022 0.7  ms Final   Lead Channel Setting Pacing Amplit* 10/21/2022 3.5  V Final   Zone Setting Status 10/21/2022 Active   Final   Zone Setting Status 10/21/2022 Inactive   Final   Zone Setting Status 10/21/2022 Active   Final   Zone Setting Status 10/21/2022 Inactive   Final   Lead Channel Impedance Value 10/21/2022 304  ohm Final   Lead Channel Impedance Value 10/21/2022 285  ohm Final   Lead Channel Sensing Intrinsic Amp* 10/21/2022 16.625  mV Final   Lead Channel Sensing Intrinsic Amp* 10/21/2022 16.625  mV Final   Lead Channel Pacing Threshold Ampl* 10/21/2022 1.25  V Final   Lead Channel Pacing Threshold Puls* 10/21/2022 0.4  ms Final   HighPow Impedance 10/21/2022 45  ohm Final   HighPow Impedance 10/21/2022 54  ohm Final   Battery Status 10/21/2022 OK   Final   Battery Remaining Longevity 10/21/2022 44  mo Final   Battery Voltage 10/21/2022 2.97  V  Final   Brady Statistic RV Percent Paced 10/21/2022 0.03  % Final  Clinical Support on 09/26/2022  Component Date Value Ref Range Status   Coagulation Factor VIII 09/26/2022 124  56 - 140 % Final   Ristocetin Co-factor, Plasma 09/26/2022 53  50 - 200 % Final   Comment: (NOTE) Performed At: [facility] [address] [place], [place] [id number] Rush Farmer MD [id number]    Von Willebrand Antigen, Plasma 09/26/2022 84  50 - 200 % Final   Comment: (NOTE) This test was developed and its performance characteristics determined by [facility]. It has not been cleared or approved by the Food and Drug Administration.    Prothrombin Time 09/26/2022 13.2  11.4 - 15.2 seconds Final   INR 09/26/2022 1.0  0.8 - 1.2 Final   Comment: (NOTE) INR goal varies based on device and disease states. Performed at [facility], [place] [address] [place], [place] [address]    aPTT 09/26/2022 29  24 - 36 seconds Final   Performed at [facility], [place] [address] [place], [place] [address]   Hgb F 09/26/2022 0.0  0.0 - 2.0 % Final   Hgb A 09/26/2022 98.1  96.4 - 98.8 % Final   Hgb A2 09/26/2022 1.9  1.8 - 3.2 % Final   Hgb S 09/26/2022 0.0  0.0 % Final   Interpretation, Hgb Fract 09/26/2022 Comment   Final   Comment: (NOTE) Normal hemoglobin present; no hemoglobin variant or beta thalassemia identified. Note: Alpha thalassemia may not be detected by the Hgb Fractionation Cascade panel. If alpha thalassemia is suspected, [facility] offers Alpha-Thalassemia DNA Analysis [phone]). Performed At: [facility] [place], [place] [id number] Rush Farmer MD [id number]    Kappa free light chain 09/26/2022 21.1 (H)  3.3 - 19.4 mg/L Final   Lambda free light chains 09/26/2022 34.2 (H)  5.7 - 26.3 mg/L Final   Kappa, lambda light chain ratio 09/26/2022 0.62  0.26 - 1.65 Final   Comment: (NOTE) Performed At: [facility] [place], [place] [id number] Rush Farmer MD [id number]    IgG (Immunoglobin G), Serum 09/26/2022 1,757 (H)  586 - 1,602 mg/dL Final   IgA 09/26/2022 169  87 - 352 mg/dL Final   IgM (Immunoglobulin M), Srm 09/26/2022 62  26 - 217 mg/dL Final   Total Protein ELP 09/26/2022 7.3  6.0 - 8.5 g/dL Corrected   Albumin SerPl Elph-Mcnc 09/26/2022 3.5  2.9 - 4.4 g/dL Corrected   Alpha 1 09/26/2022 0.2  0.0 - 0.4 g/dL Corrected   Alpha2 Glob SerPl Elph-Mcnc 09/26/2022 0.8  0.4 - 1.0 g/dL Corrected   B-Globulin SerPl Elph-Mcnc 09/26/2022 1.1  0.7 - 1.3 g/dL Corrected   Gamma Glob SerPl Elph-Mcnc 09/26/2022 1.6  0.4 - 1.8 g/dL Corrected   M Protein SerPl Elph-Mcnc 09/26/2022 0.8 (H)  Not Observed g/dL Corrected   Globulin, Total 09/26/2022 3.8  2.2 - 3.9 g/dL Corrected   Albumin/Glob SerPl 09/26/2022 1.0  0.7 - 1.7 Corrected   IFE 1 09/26/2022 Comment (A)   Corrected   Comment: (NOTE) Immunofixation shows IgG monoclonal protein with lambda light chain specificity.    Please Note 09/26/2022 Comment   Corrected   Comment: (NOTE) Protein electrophoresis scan will follow via computer, mail, or courier delivery. Performed At: [facility] [place], [place] [id number] Rush Farmer MD [id number]    Retic Ct Pct 09/26/2022 1.0  0.4 - 3.1 % Final   RBC. 09/26/2022 4.39  3.87 - 5.11 MIL/uL Final   Retic Count, Absolute 09/26/2022 45.7  19.0 - 186.0 K/uL Final   Immature Retic Fract 09/26/2022 18.0 (H)  2.3 - 15.9 % Final   Performed at [facility], [place] [address], [place], [place] [address]   Vitamin B-12 09/26/2022 486  180 - 914 pg/mL Final   Comment: (NOTE) This assay is not validated for testing neonatal or myeloproliferative syndrome specimens for Vitamin B12 levels. Performed at [facility], [place] [address] [place], [place] [address]    Haptoglobin 09/26/2022 203  42 - 296 mg/dL Final   Comment: (NOTE) Performed  At: [facility] [place], [place] [id number] Rush Farmer MD [id number]    Folate 09/26/2022 37.6  >5.9 ng/mL Final   Comment: RESULT CONFIRMED BY MANUAL DILUTION Performed at [place] [address], [place], [place] [address]    Ferritin 09/26/2022 3 (L)  11 - 307 ng/mL Final   Performed at [place] [address], [place], [place] [address]   Sodium 09/26/2022 141  135 - 145 mmol/L Final   Potassium 09/26/2022 4.1  3.5 - 5.1 mmol/L Final   Chloride 09/26/2022 107  98 - 111 mmol/L Final   CO2 09/26/2022 27  22 - 32 mmol/L Final   Glucose, Bld 09/26/2022 99  70 - 99 mg/dL Final   Glucose reference range applies only to samples taken after fasting for at least 8 hours.   BUN 09/26/2022 22 (H)  6 - 20 mg/dL Final   Creatinine 09/26/2022 1.33 (H)  0.44 - 1.00 mg/dL Final   Calcium 09/26/2022 8.9  8.9 - 10.3 mg/dL Final   Total Protein 09/26/2022 8.0  6.5 - 8.1 g/dL Final   Albumin 09/26/2022 4.0  3.5 - 5.0 g/dL Final   AST 09/26/2022 11 (L)  15 - 41 U/L Final   ALT 09/26/2022 9  0 - 44 U/L Final   Alkaline Phosphatase 09/26/2022 54  38 - 126 U/L Final   Total Bilirubin 09/26/2022 0.5  0.3 - 1.2 mg/dL Final   GFR, Estimated 09/26/2022 49 (L)  >60 mL/min Final   Comment: (NOTE) Calculated using the CKD-EPI  Creatinine Equation (2021)    Anion gap 09/26/2022 7  5 - 15 Final   Performed at [facility], [place] [address] [place], [place] [address]   WBC Count 09/26/2022 6.6  4.0 - 10.5 K/uL Final   RBC 09/26/2022 4.41  3.87 - 5.11 MIL/uL Final   Hemoglobin 09/26/2022 10.1 (L)  12.0 - 15.0 g/dL Final   Comment: Reticulocyte Hemoglobin testing may be clinically indicated, consider ordering this additional test [id number]    HCT 09/26/2022 33.6 (L)  36.0 - 46.0 % Final   MCV 09/26/2022 76.2 (L)  80.0 - 100.0 fL Final   MCH 09/26/2022 22.9 (L)  26.0 - 34.0 pg Final   MCHC 09/26/2022 30.1   30.0 - 36.0 g/dL Final   RDW 09/26/2022 20.0 (H)  11.5 - 15.5 % Final   Platelet Count 09/26/2022 265  150 - 400 K/uL Final   nRBC 09/26/2022 0.0  0.0 - 0.2 % Final   Neutrophils Relative % 09/26/2022 53  % Final   Neutro Abs 09/26/2022 3.5  1.7 - 7.7 K/uL Final   Lymphocytes Relative 09/26/2022 36  % Final   Lymphs Abs 09/26/2022 2.4  0.7 - 4.0 K/uL Final   Monocytes Relative 09/26/2022 8  % Final   Monocytes Absolute 09/26/2022 0.5  0.1 - 1.0 K/uL Final   Eosinophils Relative 09/26/2022 2  % Final   Eosinophils Absolute 09/26/2022 0.1  0.0 - 0.5 K/uL Final   Basophils Relative 09/26/2022 1  % Final   Basophils Absolute 09/26/2022 0.1  0.0 - 0.1 K/uL Final   Immature Granulocytes 09/26/2022 0  % Final   Abs Immature Granulocytes 09/26/2022 0.02  0.00 - 0.07 K/uL Final   Performed at [facility], [place] [address], [place], [place] [address]   Interpretation 09/26/2022 Note   Final   Comment: (NOTE) ------------------------------- COAGULATION: VON WILLEBRAND FACTOR ASSESSMENT CURRENT RESULTS ASSESSMENT The VWF:Ag is normal. The VWF:RCo is normal. The FVIII is normal. VON WILLEBRAND FACTOR ASSESSMENT CURRENT RESULTS INTERPRETATION - These results are not consistent with a diagnosis of VWD according to the current NHLBI guideline. VON WILLEBRAND FACTOR ASSESSMENT - Results may be falsely elevated and possibly falsely normal as VWF and FVIII may increase in pregnancy, in samples drawn from patients (particularly children) who are visibly stressed at the time of phlebotomy, as acute phase reactants, or in response to certain drug therapies such as desmopressin. Repeat testing may be necessary before excluding a diagnosis of VWD especially if the clinical suspicion is high for an underlying bleeding disorder. The setting for phlebotomy should be as calm as possible and patients should be encouraged to sit quietly prior to the blood draw. VON WILLEBRAND FA                           CTOR ASSESSMENT DEFINITIONS - VWD - von Willebrand disease; VWF - von Willebrand factor; VWF:Ag - VWF antigen; VWF:RCo - VWF ristocetin cofactor activity; FVIII - factor VIII activity. MEDICAL DIRECTOR: For questions regarding panel interpretation, please contact Jake Bathe, M.D. at [facility] at [phone]. ------------------------------- DISCLAIMER These assessments and interpretations are provided as a convenience in support of the physician-patient relationship and are not intended to replace the physician's clinical judgment. They are derived from national guidelines in addition to other evidence and expert opinion. The clinician should consider this information within the context of clinical opinion and the individual patient. SEE GUIDANCE FOR  VON WILLEBRAND FACTOR ASSESSMENT: (1) The National Heart, Lung and Blood Institute. The Diagnosis, Evaluation and Management of von Willebrand Disease. Lafe Garin, MD: [employer]                          h Publication [phone]. 2007. Available at http://kemp.com/. (2) Annie Sable et al. Othella Boyer J Hematol. 2009; 84(6):366-370. (3) Laffan M et al. Haemophilia. 2004;10(3):199-217. (4) Pasi KJ et al. Haemophilia. 2004; 10(3):218-231. Performed At: [facility] Clinical / Digital [address] [place], [place] [id number] Blanchie Serve MD NO:[id number]     RADIOGRAPHIC STUDIES: I have personally reviewed the radiological images as listed and agree with the findings in the report  CUP PACEART REMOTE DEVICE CHECK  Result Date: 10/21/2022 Scheduled remote reviewed. Normal device function.  Next remote 91 days. 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   September 26 2022- PT, PTT, Fibrinogen, von Willebrand screen do not indicate that patient has an underlying bleeding disorder.  Refered to gynecology for evaluation and management  Monoclonal gammopathy of unknown significance:  Characterized by the presence of a monoclonal protein, either a serum immunoglobulin or free light chain in which the clonal mass has not reached a state in which the condition is considered malignant.   It is potential precursor to multiple myeloma, lymphoma or amyloid.   It is also associated with a number of benign conditions such as nephropathy secondary to monoclonal gammopathy of renal significance (MGRS), neuropathies, oculopathies and dermopathies. Some conditions such as autoimmune diseases and coagulopathies are less common and recognized.   September 26 2022-  SPEP with IEP demonstrated 0.8 g/dL of IgG lambda monoclonal protein.  Serum free kappa 21.1 lambda 34.2 with a kappa lambda 0.62 IgG 1756 IgA 169 IgM 62  Will follow with periodic lab studies   Cancer Staging  No matching staging information was found for the patient.   No problem-specific Assessment & Plan notes found for this encounter.    No orders of the defined types were placed in this encounter.   30  minutes was spent in patient care.  This included time spent preparing to see the patient (e.g., review of tests), obtaining and/or reviewing separately obtained history, counseling and educating the patient/family/caregiver, ordering medications, tests, or procedures; documenting clinical information in the electronic or other health record, independently interpreting results and communicating results  to the patient/family/caregiver as well as coordination of care.       All questions were answered. The patient knows to call the clinic with any problems, questions or concerns.  This note was electronically signed.    Loni Muse, MD  10/23/2022 3:21 PM

## 2022-10-27 ENCOUNTER — Telehealth: Payer: Self-pay | Admitting: Oncology

## 2022-10-27 NOTE — Telephone Encounter (Signed)
Scheduled per 03/29 los, patient has been called and notified of upcoming appointments. 

## 2022-11-11 ENCOUNTER — Encounter: Payer: Self-pay | Admitting: Oncology

## 2022-11-18 NOTE — Progress Notes (Deleted)
[facility] OFFICE PROGRESS NOTE  Shirlean Mylar, MD 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 daily  INTERVAL HISTORY: [name] 47 y.o. female returns to the clinic today for a follow-up visit.  The patient was last seen by Dr. Angelene Giovanni 10/23/2022.  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  She is compliant with her oral iron supplement p.o. daily.  She receives Feraheme as needed her most recent dose being on 10/16/2022.  Since receiving her iron infusion, she is feeling ***n at this time.  Fatigue, pallor, dyspnea on exertion, decreased exercise tolerance?  Pica?  She follows closely with GYN and she is scheduled to see them on 01/05/2023 for follow-up.  She is currently on***to help regulate her menstrual cycles.  She still continues to have menses monthly which are irregular and passed large clots.  She denies any other abnormal bleeding or bruising.  She is reportedly up-to-date on her colonoscopy and EGD which was performed in February 2022 which was negative for any signs of bleeding.  The patient is here today for evaluation and repeat blood work.  MEDICAL HISTORY: Past Medical History:  Diagnosis Date   Acute bronchitis and bronchiolitis    Anemia    Iron supplements in the past   Asthma    Inhaler prn;triggered by seasonal changes mostly cold weathert;last asthma  attack years ago   Atrial fibrillation (HCC)    CHF (congestive heart failure) (HCC)    Chronic systolic CHF (congestive heart failure), NYHA class 2 (HCC) 01/2006   Has MDT Defibrillator, EF had improved by 2017 echo   Hypertension    Hypertension    Infection    UTI;not  frequent;last infection was in June   Infection    Yeast;not frequent   Migraines    Can't take Procardia XL, causes migraines   Other primary cardiomyopathies    Paroxysmal ventricular tachycardia (HCC)    Varicose veins 1998   Developed during last pregnancy    ALLERGIES:  is allergic to oxycodone, percocet [oxycodone-acetaminophen], hydrocodone, hydrocodone-acetaminophen, keflex [cephalexin], and oxycodone-acetaminophen.  MEDICATIONS:  Current Outpatient Medications  Medication Sig Dispense Refill   albuterol (PROVENTIL HFA;VENTOLIN HFA) 108 (90 Base) MCG/ACT inhaler Inhale 2 puffs into the lungs every 6 (six) hours as needed for wheezing or shortness of breath. Rescue inhaler 1 Inhaler 1   bisoprolol (ZEBETA) 5 MG tablet TAKE 1 TABLET AT BEDTIME (NEED APPOINTMENT FOR FURTHER REFILL) 90 tablet 3   Calcium Carbonate-Vit D-Min (QC CALCIUM-MAGNESIUM-ZINC-D3 PO) Take by mouth in the morning, at noon, and at bedtime.     cetirizine (ZYRTEC) 10 MG tablet Take 10 mg by mouth daily as needed for allergies.      hydrALAZINE (APRESOLINE) 25 MG tablet Take 1 tablet (25 mg total) by mouth 3 (three) times daily. 270 tablet 3   isosorbide mononitrate (IMDUR) 30 MG 24 hr tablet Take 1 tablet (30 mg total) by mouth daily. 60 tablet 3   Multiple Vitamin (MULTIVITAMIN WITH MINERALS) TABS tablet Take 1 tablet by mouth daily.     pantoprazole (PROTONIX) 40 MG tablet Take 1 tablet (40 mg total) by mouth 2 (two) times daily before  meals 60 tablet 11   potassium chloride SA (KLOR-CON M20) 20 MEQ tablet Take 1.5 tablets (30 mEq total) by mouth 2 (two) times daily. 90 tablet 3   progesterone (PROMETRIUM) 100 MG capsule Take 100 mg by mouth daily.     sacubitril-valsartan (ENTRESTO) 97-103 MG Take 1 tablet by mouth 2 (two) times daily. 180 tablet 3   spironolactone (ALDACTONE) 25 MG tablet Take 1 tablet (25 mg total) by mouth daily. 30 tablet 3   torsemide (DEMADEX) 20 MG tablet Take 1 tablet (20 mg total) by  mouth daily. 30 tablet 3   No current facility-administered medications for this visit.    SURGICAL HISTORY:  Past Surgical History:  Procedure Laterality Date   BREAST REDUCTION SURGERY  03/05/1994   d/t back, shoulder, and neck pain (42 F)   CARDIAC CATHETERIZATION     EF 30-35%   CARDIAC CATHETERIZATION N/A 05/04/2015   Procedure: Left Heart Cath and Coronary Angiography;  Surgeon: Lyn Records, MD;  Location: [facility] INVASIVE CV LAB;  Service: Cardiovascular;  Laterality: N/A;   CARDIAC DEFIBRILLATOR PLACEMENT     CESAREAN SECTION  1998   d/t preeclampsia   IMPLANTABLE CARDIOVERTER DEFIBRILLATOR (ICD) GENERATOR CHANGE N/A 09/10/2014   Procedure: ICD GENERATOR CHANGE;  Surgeon: Marinus Maw, MD;  Location: [facility] CATH LAB;  Service: Cardiovascular;  Laterality: N/A;   INSERT / REPLACE / REMOVE PACEMAKER   02/10/2006   Status post implantable cardioverter-defibrillator insertion  .Marland KitchenMarland KitchenMarland KitchenThe Medtronic Maximo VR, model 7232, single chamber   LAPAROSCOPIC GASTRIC SLEEVE RESECTION WITH HIATAL HERNIA REPAIR N/A 11/24/2016   Procedure: LAPAROSCOPIC GASTRIC SLEEVE RESECTION WITH HIATAL HERNIA REPAIR, UPPER ENDO;  Surgeon: Gaynelle Adu, MD;  Location: [facility] ORS;  Service: General;  Laterality: N/A;   LAPAROSCOPIC OVARIAN CYSTECTOMY Bilateral 11/24/2016   Procedure: LAPAROSCOPIC BILATERAL OVARIAN CYSTECTOMY;  Surgeon: Myna Hidalgo, DO;  Location: [facility] ORS;  Service: Gynecology;  Laterality: Bilateral;   REDUCTION MAMMAPLASTY Bilateral     REVIEW OF SYSTEMS:   Review of Systems  Constitutional: Negative for appetite change, chills, fatigue, fever and unexpected weight change.  HENT:   Negative for mouth sores, nosebleeds, sore throat and trouble swallowing.   Eyes: Negative for eye problems and icterus.  Respiratory: Negative for cough, hemoptysis, shortness of breath and wheezing.   Cardiovascular: Negative for chest pain and leg swelling.  Gastrointestinal: Negative for abdominal pain, constipation,  diarrhea, nausea and vomiting.  Genitourinary: Negative for bladder incontinence, difficulty urinating, dysuria, frequency and hematuria.   Musculoskeletal: Negative for back pain, gait problem, neck pain and neck stiffness.  Skin: Negative for itching and rash.  Neurological: Negative for dizziness, extremity weakness, gait problem, headaches, light-headedness and seizures.  Hematological: Negative for adenopathy. Does not bruise/bleed easily.  Psychiatric/Behavioral: Negative for confusion, depression and sleep disturbance. The patient is not nervous/anxious.     PHYSICAL EXAMINATION:  There were no vitals taken for this visit.  ECOG PERFORMANCE STATUS: {CHL ONC ECOG [id number]  Physical Exam  Constitutional: Oriented to person, place, and time and well-developed, well-nourished, and in no distress. No distress.  HENT:  Head: Normocephalic and atraumatic.  Mouth/Throat: Oropharynx is clear and moist. No oropharyngeal exudate.  Eyes: Conjunctivae are normal. Right eye exhibits no discharge. Left eye exhibits no discharge. No scleral icterus.  Neck: Normal range of motion. Neck supple.  Cardiovascular: Normal rate, regular rhythm, normal heart sounds and intact distal pulses.   Pulmonary/Chest: Effort normal and breath sounds normal. No respiratory distress. No wheezes. No rales.  Abdominal: Soft. Bowel sounds are normal. Exhibits no distension and no mass. There is no tenderness.  Musculoskeletal: Normal range of motion. Exhibits no edema.  Lymphadenopathy:    No cervical adenopathy.  Neurological: Alert and oriented to person, place, and time. Exhibits normal muscle tone. Gait normal. Coordination normal.  Skin: Skin is warm and dry. No rash noted. Not diaphoretic. No erythema. No pallor.  Psychiatric: Mood, memory and judgment normal.  Vitals reviewed.  LABORATORY DATA: Lab Results  Component Value Date   WBC 6.6 09/26/2022   HGB 10.1 (L) 09/26/2022   HCT 33.6 (L)  09/26/2022   MCV 76.2 (L) 09/26/2022   PLT 265 09/26/2022      Chemistry      Component Value Date/Time   NA 141 09/26/2022 0928   K 4.1 09/26/2022 0928   CL 107 09/26/2022 0928   CO2 27 09/26/2022 0928   BUN 22 (H) 09/26/2022 0928   CREATININE 1.33 (H) 09/26/2022 0928      Component Value Date/Time   CALCIUM 8.9 09/26/2022 0928   ALKPHOS 54 09/26/2022 0928   AST 11 (L) 09/26/2022 0928   ALT 9 09/26/2022 0928   BILITOT 0.5 09/26/2022 0928       RADIOGRAPHIC STUDIES:  CUP PACEART REMOTE DEVICE CHECK  Result Date: 10/21/2022 Scheduled remote reviewed. Normal device function.  Next remote 91 days. 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  She also has MGUS.  She is compliant with her oral iron supplement p.o. daily.  She receives IV iron with Feraheme 510 mg IV as needed.  Her most recent dose was on 10/16/2022.  The patient had a repeat CBC, iron studies, and ferritin drawn today.  Her labs from today demonstrate ***  We will wait for the remainder of her iron studies before determining if she requires additional IV iron.   Will see her back for follow-up visit and labs in approximately ***months.  She will continue taking her oral iron supplement.  She was instructed to take this with vitamin C to help with her iron absorption.  She is scheduled to follow-up with her GYN on 01/05/2023.  The patient also is followed for MGUS.  This will be monitored on future lab work.  The patient was advised to call immediately if she has any concerning symptoms in the interval. The patient voices understanding of current disease status and treatment options and is in agreement with the current care plan. All questions were answered. The patient knows to call the clinic with any problems, questions or  concerns. We can certainly see the patient much sooner if necessary   No orders of the defined types were placed in this encounter.    I spent {CHL ONC TIME VISIT - ZOXWR:[id number]} counseling the patient face to face. The total time spent in the appointment was {CHL ONC TIME VISIT - BJYNW:[id number]}.  Srah Ake L Brnadon Eoff, PA-C 11/18/22

## 2022-11-19 ENCOUNTER — Telehealth: Payer: Self-pay | Admitting: Physician Assistant

## 2022-11-19 NOTE — Telephone Encounter (Signed)
Patient left voicemail to reschedule her appointments coming up, reached out to patient and rescheduled.

## 2022-11-20 ENCOUNTER — Other Ambulatory Visit: Payer: BC Managed Care – PPO

## 2022-11-20 ENCOUNTER — Inpatient Hospital Stay: Payer: BC Managed Care – PPO | Admitting: Physician Assistant

## 2022-11-20 ENCOUNTER — Inpatient Hospital Stay: Payer: BC Managed Care – PPO

## 2022-11-20 ENCOUNTER — Ambulatory Visit: Payer: BC Managed Care – PPO | Admitting: Oncology

## 2022-11-23 ENCOUNTER — Other Ambulatory Visit: Payer: Self-pay

## 2022-11-23 ENCOUNTER — Inpatient Hospital Stay: Payer: BC Managed Care – PPO | Attending: Oncology

## 2022-11-23 ENCOUNTER — Other Ambulatory Visit: Payer: Self-pay | Admitting: Oncology

## 2022-11-23 DIAGNOSIS — D472 Monoclonal gammopathy: Secondary | ICD-10-CM | POA: Diagnosis present

## 2022-11-23 DIAGNOSIS — D509 Iron deficiency anemia, unspecified: Secondary | ICD-10-CM | POA: Insufficient documentation

## 2022-11-24 ENCOUNTER — Other Ambulatory Visit: Payer: Self-pay | Admitting: Physician Assistant

## 2022-11-24 DIAGNOSIS — D509 Iron deficiency anemia, unspecified: Secondary | ICD-10-CM

## 2022-11-24 NOTE — Progress Notes (Unsigned)
[facility] OFFICE PROGRESS NOTE  Shirlean Mylar, MD [address] [place] [address]  DIAGNOSIS: 1)  Microcytic anemia secondary to dysfunctional uterine bleeding and prior gastric surgery 2) MGUS  PRIOR THERAPY: IV iron as needed with Feraheme 510 mg, most recent dose on 10/16/2022.  CURRENT THERAPY: She will resume her fusion plus iron supplement today, she had stopped taking this when she had her last iron infusion  INTERVAL HISTORY: Inis Sizer 47 y.o. female returns to the clinic today for a follow-up visit.  She was last seen by Dr. Angelene Giovanni on 10/23/2022.  She is followed by the clinic for iron deficiency anemia which is felt to be secondary to abnormal uterine bleeding.  She has her menses now. She has bleeding monthly lasting sometimes 9 days. She reports heavy bleeding with the passage of clots.  She is followed by GYN. She is scheduled to see GYN on 01/05/2023. She states they mentioned uterine ablation to her. She is on progesterone to help with menopause.   For her anemia, she received IV iron with Feraheme 510 mg, the most recent dose on 10/16/2022.  She tolerated this well.  She reports that since she was undergoing IV iron, she was under the impression she should not take her oral iron supplement.  She has not been taking this and a little over a month.  She tolerates this well from a GI standpoint.  Overall since having her iron infusion, she feels like her fatigue is mildly improved.  She denies any usual shortness of breath.  She does report she does have CHF and atrial fibrillation so sometimes she is unable to tell if her symptoms are from anemia or her cardiac conditions.  She went to the dentist today and had some gum bleeding but otherwise denies gum bleeding.  She denies any epistaxis, hematuria, melena, hematochezia, or hematemesis.  Since she had her iron infusions, she no longer has ice cravings.  Her last EGD and colonoscopy was in February  2022 which was negative per patient report.  Patient mentions that she has been struggling ongoing with cramping in several muscle for several months, which sometimes are severe.  She is here today for evaluation repeat blood work.  MEDICAL HISTORY: Past Medical History:  Diagnosis Date   Acute bronchitis and bronchiolitis    Anemia    Iron supplements in the past   Asthma    Inhaler prn;triggered by seasonal changes mostly cold weathert;last asthma  attack years ago   Atrial fibrillation (HCC)    CHF (congestive heart failure) (HCC)    Chronic systolic CHF (congestive heart failure), NYHA class 2 (HCC) 01/2006   Has MDT Defibrillator, EF had improved by 2017 echo   Hypertension    Hypertension    Infection    UTI;not frequent;last infection was in June   Infection    Yeast;not frequent   Migraines    Can't take Procardia XL, causes migraines   Other primary cardiomyopathies    Paroxysmal ventricular tachycardia (HCC)    Varicose veins 1998   Developed during last pregnancy    ALLERGIES:  is allergic to oxycodone, percocet [oxycodone-acetaminophen], hydrocodone, hydrocodone-acetaminophen, keflex [cephalexin], and oxycodone-acetaminophen.  MEDICATIONS:  Current Outpatient Medications  Medication Sig Dispense Refill   albuterol (PROVENTIL HFA;VENTOLIN HFA) 108 (90 Base) MCG/ACT inhaler Inhale 2 puffs into the lungs every 6 (six) hours as needed for wheezing or shortness of breath. Rescue inhaler 1 Inhaler 1   bisoprolol (  ZEBETA) 5 MG tablet TAKE 1 TABLET AT BEDTIME (NEED APPOINTMENT FOR FURTHER REFILL) 90 tablet 3   Calcium Carbonate-Vit D-Min (QC CALCIUM-MAGNESIUM-ZINC-D3 PO) Take by mouth in the morning, at noon, and at bedtime.     cetirizine (ZYRTEC) 10 MG tablet Take 10 mg by mouth daily as needed for allergies.      hydrALAZINE (APRESOLINE) 25 MG tablet Take 1 tablet (25 mg total) by mouth 3 (three) times daily. 270 tablet 3   isosorbide mononitrate (IMDUR) 30 MG 24 hr  tablet Take 1 tablet (30 mg total) by mouth daily. 60 tablet 3   Multiple Vitamin (MULTIVITAMIN WITH MINERALS) TABS tablet Take 1 tablet by mouth daily.     pantoprazole (PROTONIX) 40 MG tablet Take 1 tablet (40 mg total) by mouth 2 (two) times daily before meals 60 tablet 11   potassium chloride SA (KLOR-CON M20) 20 MEQ tablet Take 1.5 tablets (30 mEq total) by mouth 2 (two) times daily. 90 tablet 3   progesterone (PROMETRIUM) 100 MG capsule Take 100 mg by mouth daily.     sacubitril-valsartan (ENTRESTO) 97-103 MG Take 1 tablet by mouth 2 (two) times daily. 180 tablet 3   spironolactone (ALDACTONE) 25 MG tablet Take 1 tablet (25 mg total) by mouth daily. 30 tablet 3   torsemide (DEMADEX) 20 MG tablet Take 1 tablet (20 mg total) by mouth daily. 30 tablet 3   No current facility-administered medications for this visit.    SURGICAL HISTORY:  Past Surgical History:  Procedure Laterality Date   BREAST REDUCTION SURGERY  03/05/1994   d/t back, shoulder, and neck pain (42 F)   CARDIAC CATHETERIZATION     EF 30-35%   CARDIAC CATHETERIZATION N/A 05/04/2015   Procedure: Left Heart Cath and Coronary Angiography;  Surgeon: Lyn Records, MD;  Location: [facility] INVASIVE CV LAB;  Service: Cardiovascular;  Laterality: N/A;   CARDIAC DEFIBRILLATOR PLACEMENT     CESAREAN SECTION  1998   d/t preeclampsia   IMPLANTABLE CARDIOVERTER DEFIBRILLATOR (ICD) GENERATOR CHANGE N/A 09/10/2014   Procedure: ICD GENERATOR CHANGE;  Surgeon: Marinus Maw, MD;  Location: [facility] CATH LAB;  Service: Cardiovascular;  Laterality: N/A;   INSERT / REPLACE / REMOVE PACEMAKER   02/10/2006   Status post implantable cardioverter-defibrillator insertion  .Marland KitchenMarland KitchenMarland KitchenThe Medtronic Maximo VR, model 7232, single chamber   LAPAROSCOPIC GASTRIC SLEEVE RESECTION WITH HIATAL HERNIA REPAIR N/A 11/24/2016   Procedure: LAPAROSCOPIC GASTRIC SLEEVE RESECTION WITH HIATAL HERNIA REPAIR, UPPER ENDO;  Surgeon: Gaynelle Adu, MD;  Location: [facility];  Service: General;   Laterality: N/A;   LAPAROSCOPIC OVARIAN CYSTECTOMY Bilateral 11/24/2016   Procedure: LAPAROSCOPIC BILATERAL OVARIAN CYSTECTOMY;  Surgeon: Myna Hidalgo, DO;  Location: [facility] ORS;  Service: Gynecology;  Laterality: Bilateral;   REDUCTION MAMMAPLASTY Bilateral     REVIEW OF SYSTEMS:   Review of Systems  Constitutional: Positive for fatigue. Negative for appetite change, chills, fever and unexpected weight change.  HENT: Negative for mouth sores, nosebleeds, sore throat and trouble swallowing.   Eyes: Negative for eye problems and icterus.  Respiratory: Positive for occasional dyspnea but not changed from baseline.  Negative for cough, hemoptysis, and wheezing.   Cardiovascular: Negative for chest pain and leg swelling.  Gastrointestinal: Negative for abdominal pain, constipation, diarrhea, nausea and vomiting.  Genitourinary: Negative for bladder incontinence, difficulty urinating, dysuria, frequency and hematuria.   Musculoskeletal: Negative for back pain, gait problem, neck pain and neck stiffness.  Skin: Negative for itching and rash.  Neurological: Negative for dizziness, extremity  weakness, gait problem, headaches, light-headedness and seizures.  Hematological: Negative for adenopathy. Does not bruise/bleed easily.  Psychiatric/Behavioral: Negative for confusion, depression and sleep disturbance. The patient is not nervous/anxious.     PHYSICAL EXAMINATION:  There were no vitals taken for this visit.  ECOG PERFORMANCE STATUS: 1  Physical Exam  Constitutional: Oriented to person, place, and time and well-developed, well-nourished, and in no distress.  HENT:  Head: Normocephalic and atraumatic.  Mouth/Throat: Oropharynx is clear and moist. No oropharyngeal exudate.  Eyes: Conjunctivae are normal. Right eye exhibits no discharge. Left eye exhibits no discharge. No scleral icterus.  Neck: Normal range of motion. Neck supple.  Cardiovascular: Normal rate, regular rhythm, normal heart  sounds and intact distal pulses.   Pulmonary/Chest: Effort normal and breath sounds normal. No respiratory distress. No wheezes. No rales.  Abdominal: Soft. Bowel sounds are normal. Exhibits no distension and no mass. There is no tenderness.  Musculoskeletal: Normal range of motion. Exhibits no edema.  Lymphadenopathy:    No cervical adenopathy.  Neurological: Alert and oriented to person, place, and time. Exhibits normal muscle tone. Gait normal. Coordination normal.  Skin: Skin is warm and dry. No rash noted. Not diaphoretic. No erythema. No pallor.  Psychiatric: Mood, memory and judgment normal.  Vitals reviewed.  LABORATORY DATA: Lab Results  Component Value Date   WBC 6.6 09/26/2022   HGB 10.1 (L) 09/26/2022   HCT 33.6 (L) 09/26/2022   MCV 76.2 (L) 09/26/2022   PLT 265 09/26/2022      Chemistry      Component Value Date/Time   NA 141 09/26/2022 0928   K 4.1 09/26/2022 0928   CL 107 09/26/2022 0928   CO2 27 09/26/2022 0928   BUN 22 (H) 09/26/2022 0928   CREATININE 1.33 (H) 09/26/2022 0928      Component Value Date/Time   CALCIUM 8.9 09/26/2022 0928   ALKPHOS 54 09/26/2022 0928   AST 11 (L) 09/26/2022 0928   ALT 9 09/26/2022 0928   BILITOT 0.5 09/26/2022 0928       RADIOGRAPHIC STUDIES:  No results found.   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  She receives IV iron as needed with Feraheme 510 mg.  Her most recent dose being on 10/16/2022.    She is scheduled to see her GYN on 01/05/2023 about her abnormal uterine bleeding.  She said they are contemplating a uterine ablation.  When she sees her GYN next month, she will discuss her options to treat the underlying cause of her anemia which is her abnormal uterine bleeding.  Labs were reviewed today.  Her hemoglobin is normal and her MCV is  improved.  Her iron studies are WNL. Her ferritin is pending.  Recommend that she resume her iron supplement p.o. daily with vitamin C.  If her ferritin continues to show significant iron deficiency, we will arrange for additional IV iron infusions. However, I will hold off ordering that for now given her norma labs thus far.  I will send her a MyChart message either way tomorrow  We will see her back for follow-up visit in 4-6 weeks for evaluation repeat blood work.  For her cramping, I will add on a CMP and magnesium to today's labs.  If normal, then she was encouraged to increase her hydration and drink electrolyte drinks and follow-up with her PCP.  2) MGUS:  September 26 2022-  SPEP with IEP demonstrated 0.8 g/dL of IgG lambda monoclonal protein.  Serum free kappa 21.1 lambda 34.2 with a kappa lambda 0.62 IgG 1756 IgA 169 IgM 62.  She returned her 24-hour protein a few days ago.  We reviewed this.  We discussed that we will be monitoring this every few months.  I will defer to her primary hematologist when he would like to recheck this but discussed with the patient that that may be every 6 months or so.  This will continue to be followed on periodic lab studies.  She is not due for repeat labs at this time.  The patient was advised to call immediately if she has any concerning symptoms in the interval. The patient voices understanding of current disease status and treatment options and is in agreement with the current care plan. All questions were answered. The patient knows to call the clinic with any problems, questions or concerns. We can certainly see the patient much sooner if necessary  No orders of the defined types were placed in this encounter.    The total time spent in the appointment was 20-29 minutes  Sir Mallis L Rylyn Zawistowski, PA-C 11/24/22

## 2022-11-25 LAB — UPEP/UIFE/LIGHT CHAINS/TP, 24-HR UR
% BETA, Urine: 42.6 %
ALPHA 1 URINE: 2.1 %
Albumin, U: 30.9 %
Alpha 2, Urine: 6.7 %
Free Kappa Lt Chains,Ur: 11.04 mg/L (ref 1.17–86.46)
Free Kappa/Lambda Ratio: 1.13 — ABNORMAL LOW (ref 1.83–14.26)
Free Lambda Lt Chains,Ur: 9.78 mg/L (ref 0.27–15.21)
GAMMA GLOBULIN URINE: 17.7 %
M-SPIKE %, Urine: 6.8 % — ABNORMAL HIGH
M-Spike, Mg/24 Hr: 43 mg/24 hr — ABNORMAL HIGH
Total Protein, Urine-Ur/day: 631 mg/24 hr — ABNORMAL HIGH (ref 30–150)
Total Protein, Urine: 25 mg/dL
Total Volume: 2525

## 2022-11-26 ENCOUNTER — Inpatient Hospital Stay: Payer: BC Managed Care – PPO | Attending: Hematology and Oncology

## 2022-11-26 ENCOUNTER — Other Ambulatory Visit: Payer: Self-pay

## 2022-11-26 ENCOUNTER — Encounter: Payer: Self-pay | Admitting: Physician Assistant

## 2022-11-26 ENCOUNTER — Inpatient Hospital Stay (HOSPITAL_BASED_OUTPATIENT_CLINIC_OR_DEPARTMENT_OTHER): Payer: BC Managed Care – PPO | Admitting: Physician Assistant

## 2022-11-26 VITALS — BP 117/65 | HR 70 | Temp 98.3°F | Resp 18 | Wt 225.4 lb

## 2022-11-26 DIAGNOSIS — I429 Cardiomyopathy, unspecified: Secondary | ICD-10-CM | POA: Insufficient documentation

## 2022-11-26 DIAGNOSIS — D472 Monoclonal gammopathy: Secondary | ICD-10-CM | POA: Insufficient documentation

## 2022-11-26 DIAGNOSIS — I110 Hypertensive heart disease with heart failure: Secondary | ICD-10-CM | POA: Diagnosis not present

## 2022-11-26 DIAGNOSIS — J45909 Unspecified asthma, uncomplicated: Secondary | ICD-10-CM | POA: Diagnosis not present

## 2022-11-26 DIAGNOSIS — I4891 Unspecified atrial fibrillation: Secondary | ICD-10-CM | POA: Insufficient documentation

## 2022-11-26 DIAGNOSIS — N938 Other specified abnormal uterine and vaginal bleeding: Secondary | ICD-10-CM | POA: Diagnosis not present

## 2022-11-26 DIAGNOSIS — D509 Iron deficiency anemia, unspecified: Secondary | ICD-10-CM | POA: Insufficient documentation

## 2022-11-26 DIAGNOSIS — Z9884 Bariatric surgery status: Secondary | ICD-10-CM | POA: Insufficient documentation

## 2022-11-26 DIAGNOSIS — R252 Cramp and spasm: Secondary | ICD-10-CM

## 2022-11-26 DIAGNOSIS — Z79899 Other long term (current) drug therapy: Secondary | ICD-10-CM | POA: Diagnosis not present

## 2022-11-26 LAB — CBC WITH DIFFERENTIAL (CANCER CENTER ONLY)
Abs Immature Granulocytes: 0.02 10*3/uL (ref 0.00–0.07)
Basophils Absolute: 0.1 10*3/uL (ref 0.0–0.1)
Basophils Relative: 1 %
Eosinophils Absolute: 0.2 10*3/uL (ref 0.0–0.5)
Eosinophils Relative: 3 %
HCT: 39.8 % (ref 36.0–46.0)
Hemoglobin: 12.2 g/dL (ref 12.0–15.0)
Immature Granulocytes: 0 %
Lymphocytes Relative: 35 %
Lymphs Abs: 2.5 10*3/uL (ref 0.7–4.0)
MCH: 26.3 pg (ref 26.0–34.0)
MCHC: 30.7 g/dL (ref 30.0–36.0)
MCV: 86 fL (ref 80.0–100.0)
Monocytes Absolute: 0.6 10*3/uL (ref 0.1–1.0)
Monocytes Relative: 8 %
Neutro Abs: 3.9 10*3/uL (ref 1.7–7.7)
Neutrophils Relative %: 53 %
Platelet Count: 250 10*3/uL (ref 150–400)
RBC: 4.63 MIL/uL (ref 3.87–5.11)
RDW: 21.2 % — ABNORMAL HIGH (ref 11.5–15.5)
WBC Count: 7.3 10*3/uL (ref 4.0–10.5)
nRBC: 0 % (ref 0.0–0.2)

## 2022-11-26 LAB — CMP (CANCER CENTER ONLY)
ALT: 10 U/L (ref 0–44)
AST: 11 U/L — ABNORMAL LOW (ref 15–41)
Albumin: 3.7 g/dL (ref 3.5–5.0)
Alkaline Phosphatase: 55 U/L (ref 38–126)
Anion gap: 6 (ref 5–15)
BUN: 18 mg/dL (ref 6–20)
CO2: 28 mmol/L (ref 22–32)
Calcium: 8 mg/dL — ABNORMAL LOW (ref 8.9–10.3)
Chloride: 107 mmol/L (ref 98–111)
Creatinine: 1.1 mg/dL — ABNORMAL HIGH (ref 0.44–1.00)
GFR, Estimated: >60 mL/min (ref >60)
Glucose, Bld: 119 mg/dL — ABNORMAL HIGH (ref 70–99)
Potassium: 4.4 mmol/L (ref 3.5–5.1)
Sodium: 141 mmol/L (ref 135–145)
Total Bilirubin: 0.3 mg/dL (ref 0.3–1.2)
Total Protein: 6.9 g/dL (ref 6.5–8.1)

## 2022-11-26 LAB — MAGNESIUM: Magnesium: 1.8 mg/dL (ref 1.7–2.4)

## 2022-11-26 LAB — IRON AND IRON BINDING CAPACITY (CC-WL,HP ONLY)
Iron: 42 ug/dL (ref 28–170)
Saturation Ratios: 15 % (ref 10.4–31.8)
TIBC: 287 ug/dL (ref 250–450)
UIBC: 245 ug/dL (ref 148–442)

## 2022-11-26 LAB — SAMPLE TO BLOOD BANK

## 2022-11-27 LAB — FERRITIN: Ferritin: 27 ng/mL (ref 11–307)

## 2022-11-30 NOTE — Progress Notes (Signed)
Remote ICD transmission.   

## 2022-12-02 ENCOUNTER — Other Ambulatory Visit (HOSPITAL_COMMUNITY): Payer: Self-pay | Admitting: Internal Medicine

## 2022-12-02 ENCOUNTER — Other Ambulatory Visit (HOSPITAL_COMMUNITY): Payer: Self-pay

## 2022-12-02 MED ORDER — BISOPROLOL FUMARATE 5 MG PO TABS
5.0000 mg | ORAL_TABLET | Freq: Every evening | ORAL | 0 refills | Status: DC
  Filled 2022-12-02: qty 30, 30d supply, fill #0

## 2022-12-10 ENCOUNTER — Other Ambulatory Visit: Payer: Self-pay

## 2022-12-10 ENCOUNTER — Other Ambulatory Visit (HOSPITAL_COMMUNITY): Payer: Self-pay | Admitting: Adult Health

## 2022-12-10 ENCOUNTER — Other Ambulatory Visit (HOSPITAL_COMMUNITY): Payer: Self-pay | Admitting: Cardiology

## 2022-12-10 ENCOUNTER — Other Ambulatory Visit (HOSPITAL_COMMUNITY): Payer: Self-pay

## 2022-12-10 MED ORDER — ISOSORBIDE MONONITRATE ER 30 MG PO TB24
30.0000 mg | ORAL_TABLET | Freq: Every day | ORAL | 0 refills | Status: DC
  Filled 2022-12-10: qty 60, 60d supply, fill #0

## 2022-12-10 MED ORDER — POTASSIUM CHLORIDE CRYS ER 20 MEQ PO TBCR
30.0000 meq | EXTENDED_RELEASE_TABLET | Freq: Two times a day (BID) | ORAL | 0 refills | Status: DC
  Filled 2022-12-10: qty 30, 10d supply, fill #0

## 2023-01-05 ENCOUNTER — Other Ambulatory Visit (HOSPITAL_COMMUNITY): Payer: Self-pay

## 2023-01-05 MED ORDER — PROGESTERONE 200 MG PO CAPS
200.0000 mg | ORAL_CAPSULE | Freq: Every day | ORAL | 3 refills | Status: DC
  Filled 2023-01-05: qty 90, 90d supply, fill #0

## 2023-01-08 ENCOUNTER — Other Ambulatory Visit (HOSPITAL_COMMUNITY): Payer: Self-pay

## 2023-01-08 ENCOUNTER — Other Ambulatory Visit (HOSPITAL_COMMUNITY): Payer: Self-pay | Admitting: Internal Medicine

## 2023-01-11 ENCOUNTER — Other Ambulatory Visit (HOSPITAL_COMMUNITY): Payer: Self-pay

## 2023-01-11 ENCOUNTER — Other Ambulatory Visit: Payer: Self-pay

## 2023-01-11 ENCOUNTER — Other Ambulatory Visit (HOSPITAL_COMMUNITY): Payer: Self-pay | Admitting: Cardiology

## 2023-01-11 DIAGNOSIS — I5032 Chronic diastolic (congestive) heart failure: Secondary | ICD-10-CM

## 2023-01-11 MED ORDER — BISOPROLOL FUMARATE 5 MG PO TABS
5.0000 mg | ORAL_TABLET | Freq: Every evening | ORAL | 0 refills | Status: DC
  Filled 2023-01-11: qty 30, 30d supply, fill #0

## 2023-01-12 ENCOUNTER — Other Ambulatory Visit (HOSPITAL_COMMUNITY): Payer: Self-pay

## 2023-01-15 ENCOUNTER — Other Ambulatory Visit (HOSPITAL_COMMUNITY): Payer: Self-pay

## 2023-01-15 MED ORDER — FUSION PLUS PO CAPS
1.0000 | ORAL_CAPSULE | Freq: Every day | ORAL | 0 refills | Status: DC
  Filled 2023-01-15: qty 90, 90d supply, fill #0

## 2023-01-18 ENCOUNTER — Other Ambulatory Visit (HOSPITAL_COMMUNITY): Payer: Self-pay

## 2023-01-20 ENCOUNTER — Ambulatory Visit (INDEPENDENT_AMBULATORY_CARE_PROVIDER_SITE_OTHER): Payer: BC Managed Care – PPO

## 2023-01-20 DIAGNOSIS — I428 Other cardiomyopathies: Secondary | ICD-10-CM

## 2023-01-20 LAB — CUP PACEART REMOTE DEVICE CHECK
Battery Remaining Longevity: 42 mo
Battery Voltage: 2.97 V
Brady Statistic RV Percent Paced: 0.02 %
Date Time Interrogation Session: 20240626033324
HighPow Impedance: 43 Ohm
HighPow Impedance: 53 Ohm
Implantable Lead Connection Status: 753985
Implantable Lead Implant Date: 20070718
Implantable Lead Location: 753860
Implantable Pulse Generator Implant Date: 20160215
Lead Channel Impedance Value: 285 Ohm
Lead Channel Impedance Value: 342 Ohm
Lead Channel Pacing Threshold Amplitude: 2.125 V
Lead Channel Pacing Threshold Pulse Width: 0.4 ms
Lead Channel Sensing Intrinsic Amplitude: 12.375 mV
Lead Channel Sensing Intrinsic Amplitude: 12.375 mV
Lead Channel Setting Pacing Amplitude: 3.5 V
Lead Channel Setting Pacing Pulse Width: 0.7 ms
Lead Channel Setting Sensing Sensitivity: 0.3 mV

## 2023-01-21 ENCOUNTER — Other Ambulatory Visit: Payer: Self-pay | Admitting: Oncology

## 2023-01-21 DIAGNOSIS — D509 Iron deficiency anemia, unspecified: Secondary | ICD-10-CM

## 2023-01-21 NOTE — Progress Notes (Deleted)
[facility] Initial Visit:  Patient Care Team: Maurice Small, MD as PCP - General (Family Medicine) Bensimhon, Shaune Pascal, MD as PCP - Cardiology (Cardiology) Sueanne Margarita, MD as PCP - Sleep Medicine (Cardiology)  CHIEF COMPLAINTS/PURPOSE OF CONSULTATION:   HISTORY OF PRESENTING ILLNESS: [name] 47 y.o. female is here because of anemia  Medical history notable for nonischemic cardiomyopathy, sleeve gastrectomy for morbid obesity, atrial fibrillation hypertension, chronic bronchitis,, breast reduction surgery  July 01, 2022: WBC 6.6 hemoglobin 9.3 MCV 72 platelet count 305; 55 segs 33 lymphs 9 monos 2 eos 1 basophil CMP notable for creatinine 1.14 September 26 2022:  [facility]  Menses occur monthly and the last one was 15 days in duration.  Menses are irregular.  Bleeding is moderate with passage of clots.  Does not have bleeding between periods.     Last pregnancy was delivered by C section in March 2014.   Does not have history of uterine fibroids/uterine abnormalities.  Has not received IV iron/ required PRBC's in the past.  Takes oral iron without benefit in Hgb but tolerates it from a GI standpoint.   Has have a normal diet  No history of postpartum hemorrhage requiring transfusion.   No history of hemorrhage postoperatively requiring transfusion.    No hematochezia, melena, hemoptysis, hematuria.  No  history of intra-articular or soft tissue bleeding No history of abnormal bleeding in family members.  Patient has symptoms of fatigue, pallor,  DOE, decreased performance status.  Patient has pica to ice but not to /starch/dirt.  Not taking antiplatelet therapy,  She is also taking Calcium supplements.    Patient had colonoscopy and EGD performed September 03 2020 which was negative per patient.   Last Gynecology visit was 2023   WBC 6.6 hemoglobin 10.1 MCV 76 platelet count 265; 53 segs 36 lymphs 8 monos 2 eos 1 basophil reticulocyte 1%  hemoglobin electrophoresis normal adult pattern SPEP with IEP demonstrated 0.8 g/dL of IgG lambda monoclonal protein.  Serum free kappa 21.1 lambda 34.2 with a kappa lambda 0.62 IgG 1756 IgA 169 IgM 62 INR 1.0 PTT 29 Factor VIII 124 von Willebrand factor antigen 84 ristocetin cofactor 53 Ferritin 3 folate 37.6 B12 486 CMP notable for creatinine 1.33  October 09, 2022: Feraheme 510 mg October 16, 2022: Feraheme 510 mg  October 23 2022:  Scheduled follow up for management of anemia.  Reviewed results of labs with patient.  Ice pica has improved this week.  Still has some fatigue To see Gynecology on January 05 2023  Review of Systems - Oncology  MEDICAL HISTORY: Past Medical History:  Diagnosis Date   Acute bronchitis and bronchiolitis    Anemia    Iron supplements in the past   Asthma    Inhaler prn;triggered by seasonal changes mostly cold weathert;last asthma  attack years ago   Atrial fibrillation (HCC)    CHF (congestive heart failure) (HCC)    Chronic systolic CHF (congestive heart failure), NYHA class 2 ([place]) 01/2006   Has MDT Defibrillator, EF had improved by 2017 echo   Hypertension    Hypertension    Infection    UTI;not frequent;last infection was in June   Infection    Yeast;not frequent   Migraines    Can't take Procardia XL, causes migraines   Other primary cardiomyopathies    Paroxysmal ventricular tachycardia ([place])    Varicose veins 1998   Developed during last pregnancy    SURGICAL  HISTORY: Past Surgical History:  Procedure Laterality Date   BREAST REDUCTION SURGERY  03/05/1994   d/t back, shoulder, and neck pain (42 F)   CARDIAC CATHETERIZATION     EF 30-35%   CARDIAC CATHETERIZATION N/A 05/04/2015   Procedure: Left Heart Cath and Coronary Angiography;  Surgeon: Belva Crome, MD;  Location: [facility];  Service: Cardiovascular;  Laterality: N/A;   CARDIAC DEFIBRILLATOR [place]   d/t preeclampsia   IMPLANTABLE  CARDIOVERTER DEFIBRILLATOR (ICD) GENERATOR CHANGE N/A 09/10/2014   Procedure: ICD GENERATOR CHANGE;  Surgeon: Evans Lance, MD;  [facility]: [facility];  Service: Cardiovascular;  Laterality: N/A;   INSERT / REPLACE / REMOVE PACEMAKER   02/10/2006   Status post implantable cardioverter-defibrillator insertion  .Marland KitchenMarland KitchenMarland KitchenThe Medtronic Maximo VR, model [id number], single chamber   LAPAROSCOPIC GASTRIC SLEEVE RESECTION WITH HIATAL HERNIA REPAIR N/A 11/24/2016   Procedure: LAPAROSCOPIC GASTRIC SLEEVE RESECTION WITH HIATAL HERNIA REPAIR, UPPER ENDO;  Surgeon: Greer Pickerel, MD;  Location: [facility];  Service: General;  Laterality: N/A;   LAPAROSCOPIC OVARIAN CYSTECTOMY Bilateral 11/24/2016   Procedure: LAPAROSCOPIC BILATERAL OVARIAN CYSTECTOMY;  Surgeon: Janyth Pupa, DO;  Location: [facility];  Service: Gynecology;  Laterality: Bilateral;   REDUCTION MAMMAPLASTY Bilateral     SOCIAL HISTORY: Social History   Socioeconomic History   Marital status: Divorced    Spouse name: Velna Iadarola   Number of children: 1   Years of education: 15   Highest education level: Associate degree: academic program  Occupational History   Occupation: Asst. Freight forwarder    Comment: [employer]   Occupation: ACCOUNTANT    Employer: [employer]  Tobacco Use   Smoking status: Never   Smokeless tobacco: Never  Vaping Use   Vaping Use: Never used  Substance and Sexual Activity   Alcohol use: No    Comment: wine on occasions   Drug use: No   Sexual activity: Yes    Partners: Male    Birth control/protection: Condom, None  Other Topics Concern   Not on file  Social History Narrative   ** Merged History Encounter **       Social Determinants of Health   Financial Resource Strain: Not on file  Food Insecurity: Not on file  Transportation Needs: Not on file  Physical Activity: Not on file  Stress: Not on file  Social Connections: Not on file  Intimate Partner Violence: Not on file    FAMILY HISTORY Family History   Problem Relation Age of Onset   Sickle cell trait Daughter    Other Father        Varicose veins   Hypertension Father    Diabetes Father        Controlled w/ diet and exercise   Colon polyps Father        approx 3-4   Asthma Daughter    Seizures Maternal Uncle    Migraines Mother    Ulcers Mother    Rheum arthritis Mother    Colon polyps Mother        approx 2 polyps   Migraines Daughter    Migraines Cousin        Maternal   Uterine cancer Paternal Grandmother        d. 60-61   Prostate cancer Maternal Uncle        dx 69-70; surgery and rad   Lung cancer Maternal Uncle 65       d. 69-70;  smoker   Lupus Maternal Aunt    Hypertension Paternal Aunt    Breast cancer Paternal Aunt    Other Daughter        blood transfusion @ birth;platelets were low   Diabetes Maternal Aunt    Lung cancer Maternal Aunt        dx 71-72; +chemo   Diabetes Paternal Aunt        x 2   Breast cancer Paternal Aunt    Diabetes Paternal Uncle    Hypertension Other    Heart disease Other    Cancer Cousin        maternal 1st cousin d. early 67s; NOS cancer   Breast cancer Cousin        maternal 1st cousin dx 57-48   Breast cancer Other        maternal great aunt (MGM's sister) d. breast cancer in her late 68s   Kidney cancer Cousin        paternal 1st cousin dx 54s; nephrectomy; not a smoker   Ovarian cancer Paternal Aunt 23       paternal aunt (father's maternal half-sister)   Breast cancer Paternal Aunt    Ovarian cancer Paternal Aunt    Uterine cancer Paternal Aunt    Cancer Other        paternal great aunt (PGM's sister) dx NOS cancer   Cancer Other        paternal great uncle (PGM's brother) dx NOS cancer   Colon cancer Neg Hx     ALLERGIES:  is allergic to oxycodone, percocet [oxycodone-acetaminophen], hydrocodone, hydrocodone-acetaminophen, keflex [cephalexin], and oxycodone-acetaminophen.  MEDICATIONS:  Current Outpatient Medications  Medication Sig Dispense Refill    albuterol (PROVENTIL HFA;VENTOLIN HFA) 108 (90 Base) MCG/ACT inhaler Inhale 2 puffs into the lungs every 6 (six) hours as needed for wheezing or shortness of breath. Rescue inhaler 1 Inhaler 1   bisoprolol (ZEBETA) 5 MG tablet TAKE 1 TABLET AT BEDTIME (NEED APPOINTMENT FOR FURTHER REFILL) 90 tablet 3   bisoprolol (ZEBETA) 5 MG tablet Take 1 tablet (5 mg total) by mouth at bedtime. NEEDS APPOINTMENT FOR FURTHER REFILLS. 30 tablet 0   Calcium Carbonate-Vit D-Min (QC CALCIUM-MAGNESIUM-ZINC-D3 PO) Take by mouth in the morning, at noon, and at bedtime.     cetirizine (ZYRTEC) 10 MG tablet Take 10 mg by mouth daily as needed for allergies.      hydrALAZINE (APRESOLINE) 25 MG tablet Take 1 tablet (25 mg total) by mouth 3 (three) times daily. 270 tablet 3   Iron-FA-B Cmp-C-Biot-Probiotic (FUSION PLUS) CAPS Take 1 capsule by mouth daily. 90 capsule 0   isosorbide mononitrate (IMDUR) 30 MG 24 hr tablet Take 1 tablet (30 mg total) by mouth daily. **need appt** 60 tablet 0   Multiple Vitamin (MULTIVITAMIN WITH MINERALS) TABS tablet Take 1 tablet by mouth daily.     pantoprazole (PROTONIX) 40 MG tablet Take 1 tablet (40 mg total) by mouth 2 (two) times daily before meals 60 tablet 11   potassium chloride SA (KLOR-CON M20) 20 MEQ tablet Take 1&1/2 tablets (30 mEq total) by mouth 2 (two) times daily. **need appt for more refills** 30 tablet 0   progesterone (PROMETRIUM) 100 MG capsule Take 100 mg by mouth daily.     progesterone (PROMETRIUM) 200 MG capsule Take 1 capsule (200 mg total) by mouth daily. 90 capsule 3   sacubitril-valsartan (ENTRESTO) 97-103 MG Take 1 tablet by mouth 2 (two) times daily. 180 tablet 3   spironolactone (ALDACTONE)  25 MG tablet Take 1 tablet (25 mg total) by mouth daily. 30 tablet 3   torsemide (DEMADEX) 20 MG tablet Take 1 tablet (20 mg total) by mouth daily. 30 tablet 3   No current facility-administered medications for this visit.    PHYSICAL EXAMINATION:  ECOG PERFORMANCE  STATUS: 1 - Symptomatic but completely ambulatory   There were no vitals filed for this visit.   There were no vitals filed for this visit.    Physical Exam Vitals and nursing note reviewed.  Constitutional:      General: She is not in acute distress.    Appearance: Normal appearance. She is obese. She is not ill-appearing, toxic-appearing or diaphoretic.     Comments: Here alone.    HENT:     Head: Normocephalic and atraumatic.     Right Ear: External ear normal.     Left Ear: External ear normal.     Nose: Nose normal. No congestion or rhinorrhea.  Eyes:     General: No scleral icterus.    Extraocular Movements: Extraocular movements intact.     Conjunctiva/sclera: Conjunctivae normal.     Pupils: Pupils are equal, round, and reactive to light.  Cardiovascular:     Rate and Rhythm: Normal rate.     Heart sounds: No murmur heard.    No friction rub. No gallop.  Abdominal:     General: Bowel sounds are normal.     Palpations: Abdomen is soft.  Musculoskeletal:        General: No swelling, tenderness or deformity.     Cervical back: Normal range of motion and neck supple. No rigidity or tenderness.  Lymphadenopathy:     Head:     Right side of head: No submental, submandibular, tonsillar, preauricular, posterior auricular or occipital adenopathy.     Left side of head: No submental, submandibular, tonsillar, preauricular, posterior auricular or occipital adenopathy.     Cervical: No cervical adenopathy.     Right cervical: No superficial, deep or posterior cervical adenopathy.    Left cervical: No superficial, deep or posterior cervical adenopathy.     Upper Body:     Right upper body: No supraclavicular, axillary, pectoral or epitrochlear adenopathy.     Left upper body: No supraclavicular, axillary, pectoral or epitrochlear adenopathy.  Skin:    General: Skin is warm.     Coloration: Skin is not jaundiced.  Neurological:     General: No focal deficit present.      Mental Status: She is alert and oriented to person, place, and time. Mental status is at baseline.     Cranial Nerves: No cranial nerve deficit.  Psychiatric:        Mood and Affect: Mood normal.        Behavior: Behavior normal.        Thought Content: Thought content normal.        Judgment: Judgment normal.     LABORATORY DATA: I have personally reviewed the data as listed:  Appointment on 01/20/2023  Component Date Value Ref Range Status   Date Time Interrogation Session 01/20/2023 [id number]   Final   Pulse Generator Manufacturer 01/20/2023 MERM   Final   Pulse Gen Model 01/20/2023 [id number] Melvyn Neth XT VR   Final   Pulse Gen Serial Number 01/20/2023 [id number]   Final   Clinic Name 01/20/2023 [facility]   Final   Implantable Pulse Generator Type 01/20/2023 Implantable Cardiac Defibulator   Final   Implantable Pulse Generator Implan*  01/20/2023 [id number]   Final   Implantable Lead Manufacturer 01/20/2023 MERM   Final   Implantable Lead Model 01/20/2023 Unknown   Final   Implantable Lead Serial Number 01/20/2023 [id number]   Final   Implantable Lead Implant Date 01/20/2023 [id number]   Final   Implantable Lead Location 01/20/2023 [id number]   Final   Implantable Lead Connection Status 01/20/2023 [id number]   Final   Lead Channel Setting Sensing Sensi* 01/20/2023 0.3  mV Final   Lead Channel Setting Pacing Pulse * 01/20/2023 0.7  ms Final   Lead Channel Setting Pacing Amplit* 01/20/2023 3.5  V Final   Zone Setting Status 01/20/2023 Active   Final   Zone Setting Status 01/20/2023 Inactive   Final   Zone Setting Status 01/20/2023 Active   Final   Zone Setting Status 01/20/2023 Inactive   Final   Lead Channel Impedance Value 01/20/2023 342  ohm Final   Lead Channel Impedance Value 01/20/2023 285  ohm Final   Lead Channel Sensing Intrinsic Amp* 01/20/2023 12.375  mV Final   Lead Channel Sensing Intrinsic Amp* 01/20/2023 12.375  mV Final   Lead Channel Pacing Threshold Ampl* 01/20/2023  2.125  V Final   Lead Channel Pacing Threshold Puls* 01/20/2023 0.4  ms Final   HighPow Impedance 01/20/2023 43  ohm Final   HighPow Impedance 01/20/2023 53  ohm Final   Battery Status 01/20/2023 OK   Final   Battery Remaining Longevity 01/20/2023 42  mo Final   Battery Voltage 01/20/2023 2.97  V Final   Brady Statistic RV Percent Paced 01/20/2023 0.02  % Final   Eval Rhythm 01/20/2023 SR at 68 bpm   Final    RADIOGRAPHIC STUDIES: I have personally reviewed the radiological images as listed and agree with the findings in the report  CUP PACEART REMOTE DEVICE CHECK  Result Date: 01/20/2023 Scheduled remote reviewed. Normal device function.  The RV threshold is slightly elevated, this is not a new finding Next remote 91 days. 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   September 26 2022- PT, PTT, Fibrinogen, von Willebrand screen do not indicate that patient has an underlying bleeding disorder.  Refered to gynecology for evaluation and management  Monoclonal gammopathy of unknown significance:  Characterized by the presence of a monoclonal protein, either a serum immunoglobulin or free light chain in which the clonal mass has not reached a state in which the condition is considered malignant.   It is potential precursor to multiple myeloma, lymphoma or amyloid.   It is also associated with a number of benign conditions such as nephropathy secondary to  monoclonal gammopathy of renal significance (MGRS), neuropathies, oculopathies and dermopathies. Some conditions such as autoimmune diseases and coagulopathies are less common and recognized.   September 26 2022-  SPEP with IEP demonstrated 0.8 g/dL of IgG lambda monoclonal protein.  Serum free kappa 21.1 lambda 34.2 with a kappa lambda 0.62 IgG 1756 IgA 169 IgM 62  Will follow with periodic lab studies  Cancer Staging  No matching staging information was found for the patient.   No problem-specific Assessment & Plan notes found for this encounter.    No orders of the defined types were placed in this encounter.   30  minutes was spent in patient care.  This included time spent preparing to see the patient (e.g., review of tests), obtaining and/or reviewing separately obtained history, counseling and educating the patient/family/caregiver, ordering medications, tests, or procedures; documenting clinical information in the electronic or other health record, independently interpreting results and communicating results to the patient/family/caregiver as well as coordination of care.       All questions were answered. The patient knows to call the clinic with any problems, questions or concerns.  This note was electronically signed.    Loni Muse, MD  01/21/2023 10:42 AM

## 2023-01-22 ENCOUNTER — Other Ambulatory Visit: Payer: Self-pay

## 2023-01-22 ENCOUNTER — Inpatient Hospital Stay: Payer: BC Managed Care – PPO

## 2023-01-22 ENCOUNTER — Inpatient Hospital Stay: Payer: BC Managed Care – PPO | Admitting: Oncology

## 2023-01-28 ENCOUNTER — Other Ambulatory Visit: Payer: Self-pay | Admitting: Oncology

## 2023-01-28 DIAGNOSIS — D509 Iron deficiency anemia, unspecified: Secondary | ICD-10-CM

## 2023-01-28 NOTE — Progress Notes (Signed)
[facility] Cancer Follow up Visit:  Patient Care Team: Renaye Rakers, MD as PCP - General (Family Medicine) Bensimhon, Bevelyn Buckles, MD as PCP - Cardiology (Cardiology) Quintella Reichert, MD as PCP - Sleep Medicine (Cardiology)  CHIEF COMPLAINTS/PURPOSE OF CONSULTATION:   HISTORY OF PRESENTING ILLNESS: [name] 47 y.o. female is here because of anemia  Medical history notable for nonischemic cardiomyopathy, sleeve gastrectomy for morbid obesity, atrial fibrillation hypertension, chronic bronchitis,, breast reduction surgery  July 01, 2022: WBC 6.6 hemoglobin 9.3 MCV 72 platelet count 305; 55 segs 33 lymphs 9 monos 2 eos 1 basophil CMP notable for creatinine 1.14 September 26 2022:  [facility]  Menses occur monthly and the last one was 15 days in duration.  Menses are irregular.  Bleeding is moderate with passage of clots.  Does not have bleeding between periods.     Last pregnancy was delivered by C section in March 2014.   Does not have history of uterine fibroids/uterine abnormalities.  Has not received IV iron/ required PRBC's in the past.  Takes oral iron without benefit in Hgb but tolerates it from a GI standpoint.   Has have a normal diet  No history of postpartum hemorrhage requiring transfusion.   No history of hemorrhage postoperatively requiring transfusion.    No hematochezia, melena, hemoptysis, hematuria.  No  history of intra-articular or soft tissue bleeding No history of abnormal bleeding in family members.  Patient has symptoms of fatigue, pallor,  DOE, decreased performance status.  Patient has pica to ice but not to /starch/dirt.  Not taking antiplatelet therapy,  She is also taking Calcium supplements.    Patient had colonoscopy and EGD performed September 03 2020 which was negative per patient.   Last Gynecology visit was 2023   WBC 6.6 hemoglobin 10.1 MCV 76 platelet count 265; 53 segs 36 lymphs 8 monos 2 eos 1 basophil reticulocyte 1%  hemoglobin electrophoresis normal adult pattern SPEP with IEP demonstrated 0.8 g/dL of IgG lambda monoclonal protein.  Serum free kappa 21.1 lambda 34.2 with a kappa lambda 0.62 IgG 1756 IgA 169 IgM 62 INR 1.0 PTT 29 Factor VIII 124 von Willebrand factor antigen 84 ristocetin cofactor 53 Ferritin 3 folate 37.6 B12 486 CMP notable for creatinine 1.33  October 09, 2022: Feraheme 510 mg October 16, 2022: Feraheme 510 mg  October 23 2022:   Reviewed results of labs with patient.  Ice pica has improved this week.  Still has some fatigue To see Gynecology on January 05 2023  Nov 26 2022:  Hgb 12.2  Ferritin 27 CMP notable for Glu 119 Cr 1.1 Ca 8.0   January 29 2023:  Scheduled follow up for management of anemia. Has been seen by Gynecology and contemplating endometrial ablation.  Feels OK except for some fatigue which is overall not as bad as before.  Taking fusion plus iron daily which she tolerates well.  Still having heavy periods with passage of clots.   Hgb 12.8  February 01 2023:  For sono-histogram to determine if candidate for endometrial ablation    Review of Systems - Oncology  MEDICAL HISTORY: Past Medical History:  Diagnosis Date   Acute bronchitis and bronchiolitis    Anemia    Iron supplements in the past   Asthma    Inhaler prn;triggered by seasonal changes mostly cold weathert;last asthma  attack years ago   Atrial fibrillation (HCC)    CHF (congestive heart failure) (HCC)    Chronic  systolic CHF (congestive heart failure), NYHA class 2 (HCC) 01/2006   Has MDT Defibrillator, EF had improved by 2017 echo   Hypertension    Hypertension    Infection    UTI;not frequent;last infection was in June   Infection    Yeast;not frequent   Migraines    Can't take Procardia XL, causes migraines   Other primary cardiomyopathies    Paroxysmal ventricular tachycardia (HCC)    Varicose veins 1998   Developed during last pregnancy    SURGICAL HISTORY: Past Surgical History:  Procedure  Laterality Date   BREAST REDUCTION SURGERY  03/05/1994   d/t back, shoulder, and neck pain (42 F)   CARDIAC CATHETERIZATION     EF 30-35%   CARDIAC CATHETERIZATION N/A 05/04/2015   Procedure: Left Heart Cath and Coronary Angiography;  Surgeon: Lyn Records, MD;  Location: [facility] INVASIVE CV LAB;  Service: Cardiovascular;  Laterality: N/A;   CARDIAC DEFIBRILLATOR PLACEMENT     CESAREAN SECTION  1998   d/t preeclampsia   IMPLANTABLE CARDIOVERTER DEFIBRILLATOR (ICD) GENERATOR CHANGE N/A 09/10/2014   Procedure: ICD GENERATOR CHANGE;  Surgeon: Marinus Maw, MD;  Location: [facility] CATH LAB;  Service: Cardiovascular;  Laterality: N/A;   INSERT / REPLACE / REMOVE PACEMAKER   02/10/2006   Status post implantable cardioverter-defibrillator insertion  .Marland KitchenMarland KitchenMarland KitchenThe Medtronic Maximo VR, model 7232, single chamber   LAPAROSCOPIC GASTRIC SLEEVE RESECTION WITH HIATAL HERNIA REPAIR N/A 11/24/2016   Procedure: LAPAROSCOPIC GASTRIC SLEEVE RESECTION WITH HIATAL HERNIA REPAIR, UPPER ENDO;  Surgeon: Gaynelle Adu, MD;  Location: [facility] ORS;  Service: General;  Laterality: N/A;   LAPAROSCOPIC OVARIAN CYSTECTOMY Bilateral 11/24/2016   Procedure: LAPAROSCOPIC BILATERAL OVARIAN CYSTECTOMY;  Surgeon: Myna Hidalgo, DO;  Location: [facility] ORS;  Service: Gynecology;  Laterality: Bilateral;   REDUCTION MAMMAPLASTY Bilateral     SOCIAL HISTORY: Social History   Socioeconomic History   Marital status: Divorced    Spouse name: Leon Goodnow   Number of children: 1   Years of education: 15   Highest education level: Associate degree: academic program  Occupational History   Occupation: Asst. Production designer, theatre/television/film    Comment: [employer]   Occupation: ACCOUNTANT    Employer: [employer]  Tobacco Use   Smoking status: Never   Smokeless tobacco: Never  Vaping Use   Vaping Use: Never used  Substance and Sexual Activity   Alcohol use: No    Comment: wine on occasions   Drug use: No   Sexual activity: Yes    Partners: Male    Birth  control/protection: Condom, None  Other Topics Concern   Not on file  Social History Narrative   ** Merged History Encounter **       Social Determinants of Health   Financial Resource Strain: Not on file  Food Insecurity: Not on file  Transportation Needs: Not on file  Physical Activity: Not on file  Stress: Not on file  Social Connections: Not on file  Intimate Partner Violence: Not on file    FAMILY HISTORY Family History  Problem Relation Age of Onset   Sickle cell trait Daughter    Other Father        Varicose veins   Hypertension Father    Diabetes Father        Controlled w/ diet and exercise   Colon polyps Father        approx 3-4   Asthma Daughter    Seizures Maternal Uncle    Migraines Mother  Ulcers Mother    Rheum arthritis Mother    Colon polyps Mother        approx 2 polyps   Migraines Daughter    Migraines Cousin        Maternal   Uterine cancer Paternal Grandmother        d. 60-61   Prostate cancer Maternal Uncle        dx 69-70; surgery and rad   Lung cancer Maternal Uncle 24       d. 69-70; smoker   Lupus Maternal Aunt    Hypertension Paternal Aunt    Breast cancer Paternal Aunt    Other Daughter        blood transfusion @ birth;platelets were low   Diabetes Maternal Aunt    Lung cancer Maternal Aunt        dx 71-72; +chemo   Diabetes Paternal Aunt        x 2   Breast cancer Paternal Aunt    Diabetes Paternal Uncle    Hypertension Other    Heart disease Other    Cancer Cousin        maternal 1st cousin d. early 55s; NOS cancer   Breast cancer Cousin        maternal 1st cousin dx 5-48   Breast cancer Other        maternal great aunt (MGM's sister) d. breast cancer in her late 50s   Kidney cancer Cousin        paternal 1st cousin dx 14s; nephrectomy; not a smoker   Ovarian cancer Paternal Aunt 17       paternal aunt (father's maternal half-sister)   Breast cancer Paternal Aunt    Ovarian cancer Paternal Aunt    Uterine cancer  Paternal Aunt    Cancer Other        paternal great aunt (PGM's sister) dx NOS cancer   Cancer Other        paternal great uncle (PGM's brother) dx NOS cancer   Colon cancer Neg Hx     ALLERGIES:  is allergic to oxycodone, percocet [oxycodone-acetaminophen], hydrocodone, hydrocodone-acetaminophen, keflex [cephalexin], and oxycodone-acetaminophen.  MEDICATIONS:  Current Outpatient Medications  Medication Sig Dispense Refill   albuterol (PROVENTIL HFA;VENTOLIN HFA) 108 (90 Base) MCG/ACT inhaler Inhale 2 puffs into the lungs every 6 (six) hours as needed for wheezing or shortness of breath. Rescue inhaler 1 Inhaler 1   bisoprolol (ZEBETA) 5 MG tablet TAKE 1 TABLET AT BEDTIME (NEED APPOINTMENT FOR FURTHER REFILL) 90 tablet 3   bisoprolol (ZEBETA) 5 MG tablet Take 1 tablet (5 mg total) by mouth at bedtime. NEEDS APPOINTMENT FOR FURTHER REFILLS. 30 tablet 0   Calcium Carbonate-Vit D-Min (QC CALCIUM-MAGNESIUM-ZINC-D3 PO) Take by mouth in the morning, at noon, and at bedtime.     cetirizine (ZYRTEC) 10 MG tablet Take 10 mg by mouth daily as needed for allergies.      hydrALAZINE (APRESOLINE) 25 MG tablet Take 1 tablet (25 mg total) by mouth 3 (three) times daily. 270 tablet 3   Iron-FA-B Cmp-C-Biot-Probiotic (FUSION PLUS) CAPS Take 1 capsule by mouth daily. 90 capsule 0   isosorbide mononitrate (IMDUR) 30 MG 24 hr tablet Take 1 tablet (30 mg total) by mouth daily. **need appt** 60 tablet 0   Multiple Vitamin (MULTIVITAMIN WITH MINERALS) TABS tablet Take 1 tablet by mouth daily.     pantoprazole (PROTONIX) 40 MG tablet Take 1 tablet (40 mg total) by mouth 2 (two) times daily before meals  60 tablet 11   potassium chloride SA (KLOR-CON M20) 20 MEQ tablet Take 1&1/2 tablets (30 mEq total) by mouth 2 (two) times daily. **need appt for more refills** 30 tablet 0   progesterone (PROMETRIUM) 100 MG capsule Take 100 mg by mouth daily.     progesterone (PROMETRIUM) 200 MG capsule Take 1 capsule (200 mg total)  by mouth daily. 90 capsule 3   sacubitril-valsartan (ENTRESTO) 97-103 MG Take 1 tablet by mouth 2 (two) times daily. 180 tablet 3   spironolactone (ALDACTONE) 25 MG tablet Take 1 tablet (25 mg total) by mouth daily. 30 tablet 3   torsemide (DEMADEX) 20 MG tablet Take 1 tablet (20 mg total) by mouth daily. 30 tablet 3   No current facility-administered medications for this visit.    PHYSICAL EXAMINATION:  ECOG PERFORMANCE STATUS: 1 - Symptomatic but completely ambulatory   There were no vitals filed for this visit.   There were no vitals filed for this visit.    Physical Exam Vitals and nursing note reviewed.  Constitutional:      General: She is not in acute distress.    Appearance: Normal appearance. She is obese. She is not ill-appearing, toxic-appearing or diaphoretic.     Comments: Here alone.    HENT:     Head: Normocephalic and atraumatic.     Right Ear: External ear normal.     Left Ear: External ear normal.     Nose: Nose normal. No congestion or rhinorrhea.  Eyes:     General: No scleral icterus.    Extraocular Movements: Extraocular movements intact.     Conjunctiva/sclera: Conjunctivae normal.     Pupils: Pupils are equal, round, and reactive to light.  Cardiovascular:     Rate and Rhythm: Normal rate.     Heart sounds: No murmur heard.    No friction rub. No gallop.  Abdominal:     General: Bowel sounds are normal.     Palpations: Abdomen is soft.  Musculoskeletal:        General: No swelling, tenderness or deformity.     Cervical back: Normal range of motion and neck supple. No rigidity or tenderness.  Lymphadenopathy:     Head:     Right side of head: No submental, submandibular, tonsillar, preauricular, posterior auricular or occipital adenopathy.     Left side of head: No submental, submandibular, tonsillar, preauricular, posterior auricular or occipital adenopathy.     Cervical: No cervical adenopathy.     Right cervical: No superficial, deep or  posterior cervical adenopathy.    Left cervical: No superficial, deep or posterior cervical adenopathy.     Upper Body:     Right upper body: No supraclavicular, axillary, pectoral or epitrochlear adenopathy.     Left upper body: No supraclavicular, axillary, pectoral or epitrochlear adenopathy.  Skin:    General: Skin is warm.     Coloration: Skin is not jaundiced.  Neurological:     General: No focal deficit present.     Mental Status: She is alert and oriented to person, place, and time. Mental status is at baseline.     Cranial Nerves: No cranial nerve deficit.  Psychiatric:        Mood and Affect: Mood normal.        Behavior: Behavior normal.        Thought Content: Thought content normal.        Judgment: Judgment normal.     LABORATORY DATA: I have personally reviewed  the data as listed:  Appointment on 01/20/2023  Component Date Value Ref Range Status   Date Time Interrogation Session 01/20/2023 [id number]   Final   Pulse Generator Manufacturer 01/20/2023 MERM   Final   Pulse Gen Model 01/20/2023 [id number] XT VR   Final   Pulse Gen Serial Number 01/20/2023 [id number]   Final   Clinic Name 01/20/2023 [facility]   Final   Implantable Pulse Generator Type 01/20/2023 Implantable Cardiac Defibulator   Final   Implantable Pulse Generator Implan* 01/20/2023 [id number]   Final   Implantable Lead Manufacturer 01/20/2023 MERM   Final   Implantable Lead Model 01/20/2023 Unknown   Final   Implantable Lead Serial Number 01/20/2023 [id number]   Final   Implantable Lead Implant Date 01/20/2023 [id number]   Final   Implantable Lead Location 01/20/2023 [id number]   Final   Implantable Lead Connection Status 01/20/2023 [id number]   Final   Lead Channel Setting Sensing Sensi* 01/20/2023 0.3  mV Final   Lead Channel Setting Pacing Pulse * 01/20/2023 0.7  ms Final   Lead Channel Setting Pacing Amplit* 01/20/2023 3.5  V Final   Zone Setting Status 01/20/2023 Active   Final   Zone Setting  Status 01/20/2023 Inactive   Final   Zone Setting Status 01/20/2023 Active   Final   Zone Setting Status 01/20/2023 Inactive   Final   Lead Channel Impedance Value 01/20/2023 342  ohm Final   Lead Channel Impedance Value 01/20/2023 285  ohm Final   Lead Channel Sensing Intrinsic Amp* 01/20/2023 12.375  mV Final   Lead Channel Sensing Intrinsic Amp* 01/20/2023 12.375  mV Final   Lead Channel Pacing Threshold Ampl* 01/20/2023 2.125  V Final   Lead Channel Pacing Threshold Puls* 01/20/2023 0.4  ms Final   HighPow Impedance 01/20/2023 43  ohm Final   HighPow Impedance 01/20/2023 53  ohm Final   Battery Status 01/20/2023 OK   Final   Battery Remaining Longevity 01/20/2023 42  mo Final   Battery Voltage 01/20/2023 2.97  V Final   Brady Statistic RV Percent Paced 01/20/2023 0.02  % Final   Eval Rhythm 01/20/2023 SR at 68 bpm   Final    RADIOGRAPHIC STUDIES: I have personally reviewed the radiological images as listed and agree with the findings in the report  No results found.  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  Ferritin 14.  Continuing oral iron  Dysfunctional uterine bleeding:  Characterized by menses that are prolonged, irregular as well as by heavy bleeding with passage of clots.   September 26 2022- PT, PTT, Fibrinogen, von Willebrand screen do not indicate that patient has an underlying bleeding disorder.  Refered to gynecology for evaluation and management January 29 2023- Still with heavy menstrual bleeding.   February 01 2023: For sono-histogram to determine if candidate for endometrial ablation    Monoclonal gammopathy of unknown significance:  Characterized by the presence of a monoclonal protein, either a serum immunoglobulin or free light chain in which the clonal mass has not reached a state in which the condition is considered malignant.   It is potential precursor to multiple myeloma,  lymphoma or amyloid.   It is also associated with a number of benign conditions such as nephropathy secondary to monoclonal gammopathy of renal significance (MGRS), neuropathies, oculopathies and dermopathies. Some conditions such as autoimmune diseases and coagulopathies are less common and recognized.   September 26 2022-  SPEP with IEP demonstrated 0.8 g/dL of IgG lambda monoclonal protein.  Serum free kappa 21.1 lambda 34.2 with a kappa lambda 0.62 IgG 1756 IgA 169 IgM 62  Will follow with periodic lab studies  January 29 2023- Order repeat studies at next visit   Cancer Staging  No matching staging information was found for the patient.   No problem-specific Assessment & Plan notes found for this encounter.    No orders of the defined types were placed in this encounter.   30  minutes was spent in patient care.  This included time spent preparing to see the patient (e.g., review of tests), obtaining and/or reviewing separately obtained history, counseling and educating the patient/family/caregiver, ordering medications, tests, or procedures; documenting clinical information in the electronic or other health record, independently interpreting results and communicating results to the patient/family/caregiver as well as coordination of care.       All questions were answered. The patient knows to call the clinic with any problems, questions or concerns.  This note was electronically signed.    Loni Muse, MD  01/28/2023 7:37 AM

## 2023-01-29 ENCOUNTER — Encounter (HOSPITAL_COMMUNITY): Payer: Self-pay

## 2023-01-29 ENCOUNTER — Inpatient Hospital Stay: Payer: BC Managed Care – PPO | Admitting: Oncology

## 2023-01-29 ENCOUNTER — Ambulatory Visit (HOSPITAL_BASED_OUTPATIENT_CLINIC_OR_DEPARTMENT_OTHER)
Admission: RE | Admit: 2023-01-29 | Discharge: 2023-01-29 | Disposition: A | Discharge status: Home / Self Care | Payer: BC Managed Care – PPO | Source: Ambulatory Visit | Attending: Family Medicine | Admitting: Family Medicine

## 2023-01-29 ENCOUNTER — Other Ambulatory Visit: Payer: Self-pay

## 2023-01-29 ENCOUNTER — Inpatient Hospital Stay: Payer: BC Managed Care – PPO | Attending: Hematology and Oncology

## 2023-01-29 ENCOUNTER — Other Ambulatory Visit (HOSPITAL_COMMUNITY): Payer: Self-pay

## 2023-01-29 VITALS — BP 122/85 | HR 72 | Temp 98.8°F | Resp 17 | Ht 64.0 in | Wt 220.9 lb

## 2023-01-29 VITALS — BP 104/78 | HR 68 | Wt 220.0 lb

## 2023-01-29 DIAGNOSIS — Z95810 Presence of automatic (implantable) cardiac defibrillator: Secondary | ICD-10-CM | POA: Insufficient documentation

## 2023-01-29 DIAGNOSIS — I5042 Chronic combined systolic (congestive) and diastolic (congestive) heart failure: Secondary | ICD-10-CM | POA: Insufficient documentation

## 2023-01-29 DIAGNOSIS — I428 Other cardiomyopathies: Secondary | ICD-10-CM | POA: Insufficient documentation

## 2023-01-29 DIAGNOSIS — G4733 Obstructive sleep apnea (adult) (pediatric): Secondary | ICD-10-CM

## 2023-01-29 DIAGNOSIS — E669 Obesity, unspecified: Secondary | ICD-10-CM

## 2023-01-29 DIAGNOSIS — N920 Excessive and frequent menstruation with regular cycle: Secondary | ICD-10-CM | POA: Insufficient documentation

## 2023-01-29 DIAGNOSIS — I10 Essential (primary) hypertension: Secondary | ICD-10-CM

## 2023-01-29 DIAGNOSIS — I5032 Chronic diastolic (congestive) heart failure: Secondary | ICD-10-CM | POA: Diagnosis not present

## 2023-01-29 DIAGNOSIS — D472 Monoclonal gammopathy: Secondary | ICD-10-CM | POA: Diagnosis not present

## 2023-01-29 DIAGNOSIS — Z9884 Bariatric surgery status: Secondary | ICD-10-CM | POA: Insufficient documentation

## 2023-01-29 DIAGNOSIS — Z79899 Other long term (current) drug therapy: Secondary | ICD-10-CM | POA: Diagnosis not present

## 2023-01-29 DIAGNOSIS — I110 Hypertensive heart disease with heart failure: Secondary | ICD-10-CM | POA: Insufficient documentation

## 2023-01-29 DIAGNOSIS — K909 Intestinal malabsorption, unspecified: Secondary | ICD-10-CM | POA: Insufficient documentation

## 2023-01-29 DIAGNOSIS — D500 Iron deficiency anemia secondary to blood loss (chronic): Secondary | ICD-10-CM | POA: Diagnosis not present

## 2023-01-29 DIAGNOSIS — D509 Iron deficiency anemia, unspecified: Secondary | ICD-10-CM | POA: Insufficient documentation

## 2023-01-29 DIAGNOSIS — D539 Nutritional anemia, unspecified: Secondary | ICD-10-CM

## 2023-01-29 DIAGNOSIS — N938 Other specified abnormal uterine and vaginal bleeding: Secondary | ICD-10-CM | POA: Diagnosis not present

## 2023-01-29 LAB — CBC WITH DIFFERENTIAL/PLATELET
Abs Immature Granulocytes: 0.02 10*3/uL (ref 0.00–0.07)
Basophils Absolute: 0 10*3/uL (ref 0.0–0.1)
Basophils Relative: 1 %
Eosinophils Absolute: 0.2 10*3/uL (ref 0.0–0.5)
Eosinophils Relative: 3 %
HCT: 40.4 % (ref 36.0–46.0)
Hemoglobin: 12.8 g/dL (ref 12.0–15.0)
Immature Granulocytes: 0 %
Lymphocytes Relative: 38 %
Lymphs Abs: 2.9 10*3/uL (ref 0.7–4.0)
MCH: 27.6 pg (ref 26.0–34.0)
MCHC: 31.7 g/dL (ref 30.0–36.0)
MCV: 87.1 fL (ref 80.0–100.0)
Monocytes Absolute: 0.5 10*3/uL (ref 0.1–1.0)
Monocytes Relative: 7 %
Neutro Abs: 3.9 10*3/uL (ref 1.7–7.7)
Neutrophils Relative %: 51 %
Platelets: 242 10*3/uL (ref 150–400)
RBC: 4.64 MIL/uL (ref 3.87–5.11)
RDW: 15.9 % — ABNORMAL HIGH (ref 11.5–15.5)
WBC: 7.6 10*3/uL (ref 4.0–10.5)
nRBC: 0 % (ref 0.0–0.2)

## 2023-01-29 LAB — FOLATE: Folate: >40 ng/mL (ref >5.9)

## 2023-01-29 LAB — VITAMIN B12: Vitamin B-12: 455 pg/mL (ref 180–914)

## 2023-01-29 LAB — ECHOCARDIOGRAM COMPLETE
AR max vel: 2.35 cm2
AV Area VTI: 2.57 cm2
AV Area mean vel: 2.33 cm2
AV Mean grad: 4 mmHg
AV Peak grad: 5.7 mmHg
Ao pk vel: 1.19 m/s
Area-P 1/2: 3.01 cm2
Height: 64 in
S' Lateral: 3.2 cm
Weight: 3534.4 oz

## 2023-01-29 LAB — FERRITIN: Ferritin: 14 ng/mL (ref 11–307)

## 2023-01-29 MED ORDER — ISOSORBIDE MONONITRATE ER 30 MG PO TB24
30.0000 mg | ORAL_TABLET | Freq: Every day | ORAL | 6 refills | Status: DC
  Filled 2023-01-29: qty 30, 30d supply, fill #0
  Filled 2023-05-07: qty 30, 30d supply, fill #1
  Filled 2023-06-22: qty 30, 30d supply, fill #2
  Filled 2023-09-13: qty 30, 30d supply, fill #3
  Filled 2023-12-22: qty 30, 30d supply, fill #4
  Filled 2024-01-20: qty 30, 30d supply, fill #5

## 2023-01-29 MED ORDER — POTASSIUM CHLORIDE CRYS ER 20 MEQ PO TBCR
30.0000 meq | EXTENDED_RELEASE_TABLET | Freq: Two times a day (BID) | ORAL | 6 refills | Status: DC
  Filled 2023-01-29: qty 45, 15d supply, fill #0

## 2023-01-29 MED ORDER — BISOPROLOL FUMARATE 5 MG PO TABS
5.0000 mg | ORAL_TABLET | Freq: Every evening | ORAL | 6 refills | Status: DC
  Filled 2023-01-29 – 2023-05-07 (×2): qty 30, 30d supply, fill #0
  Filled 2023-06-22: qty 30, 30d supply, fill #1
  Filled 2023-08-10: qty 30, 30d supply, fill #2
  Filled 2023-09-13: qty 30, 30d supply, fill #3
  Filled 2024-01-22: qty 30, 30d supply, fill #4

## 2023-01-29 NOTE — Patient Instructions (Addendum)
Thank you for coming in today  If you had labs drawn today, any labs that are abnormal the clinic will call you No news is good news  Dr. Malachy Mood office was contacted to follow up with you about your CPAP  Medications: No changes    Follow up appointments:  Your physician recommends that you schedule a follow-up appointment in:  6 months With Dr. Gala Romney  You will receive a reminder letter in the mail a few months in advance. If you don't receive a letter, please call our office to schedule the follow-up appointment.     Do the following things EVERYDAY: Weigh yourself in the morning before breakfast. Write it down and keep it in a log. Take your medicines as prescribed Eat low salt foods--Limit salt (sodium) to 2000 mg per day.  Stay as active as you can everyday Limit all fluids for the day to less than 2 liters   At the [facility], you and your health needs are our priority. As part of our continuing mission to provide you with exceptional heart care, we have created designated Provider Care Teams. These Care Teams include your primary Cardiologist (physician) and Advanced Practice Providers (APPs- Physician Assistants and Nurse Practitioners) who all work together to provide you with the care you need, when you need it.   You may see any of the following providers on your designated Care Team at your next follow up: Dr Arvilla Meres Dr Marca Ancona Dr. Marcos Eke, NP Robbie Lis, [place] [facility] [place], [place] Brynda Peon, NP Karle Plumber, PharmD   Please be sure to bring in all your medications bottles to every appointment.    Thank you for choosing [facility]  If you have any questions or concerns before your next appointment please send [place] a message through [place] or call our office at [phone].    TO LEAVE A MESSAGE FOR THE NURSE SELECT OPTION 2, PLEASE LEAVE A  MESSAGE INCLUDING: YOUR NAME DATE OF BIRTH CALL BACK NUMBER REASON FOR CALL**this is important as we prioritize the call backs  YOU WILL RECEIVE A CALL BACK THE SAME DAY AS LONG AS YOU CALL BEFORE 4:00 PM

## 2023-01-29 NOTE — Progress Notes (Signed)
Patient ID: [name], female   DOB: Aug 06, 1975, 47 y.o.   MRN: [id number]    Advanced Heart Failure Clinic Note   PCP: Dr. Kellie Shropshire  OB:  Dr. Pennie Rushing  EP: Dr. Ladona Ridgel HF Cardiologist: Bensimhon  Reason for Visit: F/u for chronic systolic heart failure   HPI: [name] is a 47 y.o. female with h/o obesity, chronic systolic heart failure due to nonischemic cardiomyopathy (cath 2007. No CAD) s/p Medtronic ICD implantation - now with recovered EF, asthma/chronic bronchitic, gastric sleeve, and severe HTN  Echo 7/18 EF 60-65%   Echo 10/19 EF 25-30% (in setting of stopping meds). Meds were resumed. Echo repeated 09/2018 and EF improved to 45-50%.   Follow up 9/22, stable NYHA II and volume stable.  On good GDMT, Planned for follow up with repeat echo x 6 months, unfortunately lost to follow up.  Today she returns for HF follow up. We have not seen her since 9/22. Overall feeling fine. She is not SOB with activity. She has "episodes" of jaw pain with radiation to right neck and arm, can last seconds to minutes. Episodes less frequent since he has had iron transfusions. Feels occasional palpitations, does not wear CPAP. Denies dizziness, edema, or PND/Orthopnea. Appetite ok. No fever or chills. Weight at home 220 pounds. Taking all medications. Wears CPAP. Planning possible endometrial ablation.   Echo today 01/29/23, EF appears similar to previous, but not officially read by MD yet   Cardiac Testing   - Echo (5/21): EF 45-50% - Echo (3/20): EF 45-50% - Echo (10/19): EF 25-30% (2/2 to stopping GDMT) - Echo (7/18): EF 60-65% - Echo (3/17): EF 55-60%, grade I DD - Echo (1/16): EF 25-30% - Echo (5/14): EF 35% - Echo (2/14): EF 50% - Echo (1/14): EF 40% - Echo (12/13): EF 40% - Echo (1013): EF 40% - Echo (1/13): EF 55-60% - Echo (10/12): EF 45-50%                                    - CPX 01/2014: Resting HR 75, Peak HR 121 Peak VO2 15.2, 74.4% predicted VE/VCO2 slope:  26.4 OUES: 1.93 Peak RER 1.15 VE/MVV 61.7%  SH: Married. Lives in [place], 2 children. Working at [employer]. No ETOH or smoking  FH: Mother living: DM2, HTN, no CAD        Father living: DM2, HTN  Review of systems complete and found to be negative unless listed in HPI.   Past Medical History:  Diagnosis Date   Acute bronchitis and bronchiolitis    Anemia    Iron supplements in the past   Asthma    Inhaler prn;triggered by seasonal changes mostly cold weathert;last asthma  attack years ago   Atrial fibrillation (HCC)    CHF (congestive heart failure) (HCC)    Chronic systolic CHF (congestive heart failure), NYHA class 2 (HCC) 01/2006   Has MDT Defibrillator, EF had improved by 2017 echo   Hypertension    Hypertension    Infection    UTI;not frequent;last infection was in June   Infection    Yeast;not frequent   Migraines    Can't take Procardia XL, causes migraines   Other primary cardiomyopathies    Paroxysmal ventricular tachycardia (HCC)    Varicose veins 1998   Developed during last pregnancy   Current Outpatient Medications  Medication Sig Dispense Refill   albuterol (PROVENTIL HFA;VENTOLIN  HFA) 108 (90 Base) MCG/ACT inhaler Inhale 2 puffs into the lungs every 6 (six) hours as needed for wheezing or shortness of breath. Rescue inhaler 1 Inhaler 1   bisoprolol (ZEBETA) 5 MG tablet Take 1 tablet (5 mg total) by mouth at bedtime. NEEDS APPOINTMENT FOR FURTHER REFILLS. (Patient taking differently: Take 2.5 mg by mouth at bedtime.) 30 tablet 0   Calcium Carbonate-Vit D-Min (QC CALCIUM-MAGNESIUM-ZINC-D3 PO) Take by mouth in the morning, at noon, and at bedtime.     cetirizine (ZYRTEC) 10 MG tablet Take 10 mg by mouth daily as needed for allergies.      hydrALAZINE (APRESOLINE) 25 MG tablet Take 1 tablet (25 mg total) by mouth 3 (three) times daily. 270 tablet 3   Iron-FA-B Cmp-C-Biot-Probiotic (FUSION PLUS) CAPS Take 1 capsule by mouth daily. 90 capsule 0   isosorbide  mononitrate (IMDUR) 30 MG 24 hr tablet Take 1 tablet (30 mg total) by mouth daily. **need appt** 60 tablet 0   Multiple Vitamin (MULTIVITAMIN WITH MINERALS) TABS tablet Take 1 tablet by mouth daily.     pantoprazole (PROTONIX) 40 MG tablet Take 1 tablet (40 mg total) by mouth 2 (two) times daily before meals 60 tablet 11   potassium chloride SA (KLOR-CON M20) 20 MEQ tablet Take 1&1/2 tablets (30 mEq total) by mouth 2 (two) times daily. **need appt for more refills** 30 tablet 0   progesterone (PROMETRIUM) 200 MG capsule Take 1 capsule (200 mg total) by mouth daily. 90 capsule 3   sacubitril-valsartan (ENTRESTO) 97-103 MG Take 1 tablet by mouth 2 (two) times daily. 180 tablet 3   spironolactone (ALDACTONE) 25 MG tablet Take 1 tablet (25 mg total) by mouth daily. 30 tablet 3   torsemide (DEMADEX) 20 MG tablet Take 1 tablet (20 mg total) by mouth daily. 30 tablet 3   No current facility-administered medications for this encounter.   Pulse 68   Wt 99.8 kg (220 lb)   SpO2 98%   BMI 37.76 kg/m    Wt Readings from Last 3 Encounters:  01/29/23 99.8 kg (220 lb)  01/29/23 100.2 kg (220 lb 14.4 oz)  11/26/22 102.2 kg (225 lb 6.4 oz)   PHYSICAL EXAM: General:  NAD. No resp difficulty, walked into clinic HEENT: Normal Neck: Supple. No JVD. Carotids 2+ bilat; no bruits. No lymphadenopathy or thryomegaly appreciated. Cor: PMI nondisplaced. Regular rate & rhythm. No rubs, gallops or murmurs. Lungs: Clear Abdomen: Soft, obese, nontender, nondistended. No hepatosplenomegaly. No bruits or masses. Good bowel sounds. Extremities: No cyanosis, clubbing, rash, edema Neuro: Alert & oriented x 3, cranial nerves grossly intact. Moves all 4 extremities w/o difficulty. Affect pleasant.  ECG (personally reviewed): NSR 65 bpm  Device interrogation (personally reviewed): OptiVol stable, no VT, 3.3 hr/day activity, < 0.1% VP  ASSESSMENT & PLAN:  1) Chronic combined systolic/diastolic heart failure  - NICM   - s/p ICD likely r/t HTN,  - EF in 2016 25-30%.  - Cath 8/16 showed normal cors - Echo (7/18): EF 55-60% - Echo (10/19): EF 25-30% in setting of medication noncompliance - Echo (3/20): EF 45-50% - Echo (5/21): EF 45-50% - Echo today 01/29/23, results pending - NYHA Class II (stable), volume ok  - Continue torsemide 20 mg daily + 30 KCL bid - Continue Farxiga 10 mg daily  - Continue Entresto 97/103 bid - Continue spiro 25 mg daily - Continue bisoprolol 2.5 mg daily  - Continue hydralazine 25 mg tid + Imdur 30 mg daily. - Check BMP  and Mg level today   2) OSA  - Does not wear CPAP  - Refer back to Dr. [name] to discuss.  3) HTN - BP stable - GDMT as above - No change  4) Obesity - S/p laparoscopic sleeve gastrectomy with hiatal hernia repair, upper endoscopy.  - Body mass index is 37.76 kg/m. - Encouraged to continue walking for exercise   5) Anemia - Felt to be 2/2 heavy menstrual bleeding - She is on progesterone - Follows with Heme/Onc and receiving Iron infusions.   Follow up in 6 months with Dr. Gala Romney  Jacklynn Ganong, FNP  12:03 PM

## 2023-02-01 ENCOUNTER — Telehealth: Payer: Self-pay | Admitting: Oncology

## 2023-02-01 ENCOUNTER — Other Ambulatory Visit: Payer: Self-pay

## 2023-02-01 NOTE — Telephone Encounter (Signed)
Spoke with patient confirming upcoming appointment  

## 2023-02-08 ENCOUNTER — Encounter: Payer: Self-pay | Admitting: Family Medicine

## 2023-02-08 ENCOUNTER — Other Ambulatory Visit (HOSPITAL_COMMUNITY): Payer: Self-pay

## 2023-02-08 ENCOUNTER — Other Ambulatory Visit (HOSPITAL_COMMUNITY): Payer: Self-pay | Admitting: Cardiology

## 2023-02-08 MED ORDER — POTASSIUM CHLORIDE CRYS ER 20 MEQ PO TBCR
30.0000 meq | EXTENDED_RELEASE_TABLET | Freq: Two times a day (BID) | ORAL | 6 refills | Status: DC
  Filled 2023-02-08 – 2023-02-12 (×2): qty 90, 30d supply, fill #0
  Filled 2023-03-30: qty 90, 30d supply, fill #1
  Filled 2023-05-07: qty 90, 30d supply, fill #2
  Filled 2023-06-22: qty 90, 30d supply, fill #3
  Filled 2023-08-10: qty 90, 30d supply, fill #4
  Filled 2023-09-13: qty 90, 30d supply, fill #5
  Filled 2023-11-06: qty 90, 30d supply, fill #6

## 2023-02-10 NOTE — Progress Notes (Signed)
 Remote ICD transmission.   

## 2023-02-12 ENCOUNTER — Other Ambulatory Visit (HOSPITAL_COMMUNITY): Payer: Self-pay

## 2023-02-12 ENCOUNTER — Other Ambulatory Visit: Payer: Self-pay

## 2023-02-15 ENCOUNTER — Other Ambulatory Visit (HOSPITAL_COMMUNITY): Payer: Self-pay | Admitting: Cardiology

## 2023-02-16 ENCOUNTER — Other Ambulatory Visit: Payer: Self-pay

## 2023-02-16 ENCOUNTER — Other Ambulatory Visit (HOSPITAL_COMMUNITY): Payer: Self-pay

## 2023-02-16 MED ORDER — SPIRONOLACTONE 25 MG PO TABS
25.0000 mg | ORAL_TABLET | Freq: Every day | ORAL | 11 refills | Status: DC
  Filled 2023-02-16: qty 30, 30d supply, fill #0
  Filled 2023-05-07: qty 30, 30d supply, fill #1
  Filled 2023-06-22: qty 30, 30d supply, fill #2
  Filled 2023-08-10: qty 30, 30d supply, fill #3
  Filled 2023-09-13: qty 30, 30d supply, fill #4
  Filled 2023-11-06: qty 30, 30d supply, fill #5
  Filled 2023-12-22: qty 30, 30d supply, fill #6
  Filled 2024-01-20: qty 30, 30d supply, fill #7

## 2023-02-22 ENCOUNTER — Encounter: Payer: Self-pay | Admitting: Oncology

## 2023-02-25 ENCOUNTER — Other Ambulatory Visit (HOSPITAL_COMMUNITY): Payer: Self-pay

## 2023-03-10 ENCOUNTER — Telehealth (HOSPITAL_COMMUNITY): Payer: Self-pay | Admitting: *Deleted

## 2023-03-10 NOTE — Telephone Encounter (Signed)
Received fax from [facility], pt is sch for total abd hysterectomy, bilat salpingectomy on 04/27/23 under general anesthesia with Dr Vincente Poli at [facility]  Per Dr Gala Romney: "ok to proceed"  Form faxed back to Surgery Scheduler at [phone]

## 2023-03-24 ENCOUNTER — Encounter: Payer: Self-pay | Admitting: Internal Medicine

## 2023-03-24 ENCOUNTER — Ambulatory Visit: Payer: BC Managed Care – PPO | Attending: Internal Medicine | Admitting: Internal Medicine

## 2023-03-24 VITALS — BP 126/82 | HR 78 | Ht 61.0 in | Wt 223.0 lb

## 2023-03-24 DIAGNOSIS — I5022 Chronic systolic (congestive) heart failure: Secondary | ICD-10-CM | POA: Diagnosis not present

## 2023-03-24 LAB — CUP PACEART INCLINIC DEVICE CHECK
Battery Remaining Longevity: 39 mo
Battery Voltage: 2.96 V
Brady Statistic RV Percent Paced: 0.01 %
Date Time Interrogation Session: 20240828162309
HighPow Impedance: 48 Ohm
HighPow Impedance: 61 Ohm
Implantable Lead Connection Status: 753985
Implantable Lead Implant Date: 20070718
Implantable Lead Location: 753860
Implantable Pulse Generator Implant Date: 20160215
Lead Channel Impedance Value: 285 Ohm
Lead Channel Impedance Value: 361 Ohm
Lead Channel Pacing Threshold Amplitude: 2.25 V
Lead Channel Pacing Threshold Pulse Width: 0.4 ms
Lead Channel Sensing Intrinsic Amplitude: 13.875 mV
Lead Channel Sensing Intrinsic Amplitude: 18.375 mV
Lead Channel Setting Pacing Amplitude: 3.5 V
Lead Channel Setting Pacing Pulse Width: 0.7 ms
Lead Channel Setting Sensing Sensitivity: 0.3 mV

## 2023-03-24 NOTE — Progress Notes (Signed)
HPI [name] returns to our EP clinic after  along absence. She is a pleasant 47 yo woman with a non-ischemic CM, with variable LV dysunction. Most recent echo was 50%. She has been on GDMT. She denies ICD therapies. She was seen back in the CHF clinic a few weeks ago after a long absence. She feels well overall. Allergies  Allergen Reactions   Oxycodone Other (See Comments) and Shortness Of Breath    Reaction: Fever (intolerance)   Percocet [Oxycodone-Acetaminophen] Shortness Of Breath    Tolerates acetaminophen   Hydrocodone Itching   Hydrocodone-Acetaminophen Hives   Keflex [Cephalexin] Hives   Oxycodone-Acetaminophen     Other Reaction(s): respiratory distress     Current Outpatient Medications  Medication Sig Dispense Refill   albuterol (PROVENTIL HFA;VENTOLIN HFA) 108 (90 Base) MCG/ACT inhaler Inhale 2 puffs into the lungs every 6 (six) hours as needed for wheezing or shortness of breath. Rescue inhaler 1 Inhaler 1   bisoprolol (ZEBETA) 5 MG tablet Take 1 tablet (5 mg total) by mouth at bedtime. 30 tablet 6   Calcium Carbonate-Vit D-Min (QC CALCIUM-MAGNESIUM-ZINC-D3 PO) Take by mouth in the morning, at noon, and at bedtime.     cetirizine (ZYRTEC) 10 MG tablet Take 10 mg by mouth daily as needed for allergies.      hydrALAZINE (APRESOLINE) 25 MG tablet Take 1 tablet (25 mg total) by mouth 3 (three) times daily. 270 tablet 3   Iron-FA-B Cmp-C-Biot-Probiotic (FUSION PLUS) CAPS Take 1 capsule by mouth daily. 90 capsule 0   isosorbide mononitrate (IMDUR) 30 MG 24 hr tablet Take 1 tablet (30 mg total) by mouth daily. 30 tablet 6   Multiple Vitamin (MULTIVITAMIN WITH MINERALS) TABS tablet Take 1 tablet by mouth daily.     pantoprazole (PROTONIX) 40 MG tablet Take 1 tablet (40 mg total) by mouth 2 (two) times daily before meals 60 tablet 11   potassium chloride SA (KLOR-CON M20) 20 MEQ tablet Take 1.5 tablets (30 mEq total) by mouth 2 (two) times daily. 90 tablet 6    progesterone (PROMETRIUM) 200 MG capsule Take 1 capsule (200 mg total) by mouth daily. 90 capsule 3   sacubitril-valsartan (ENTRESTO) 97-103 MG Take 1 tablet by mouth 2 (two) times daily. 180 tablet 3   spironolactone (ALDACTONE) 25 MG tablet Take 1 tablet (25 mg total) by mouth daily. 30 tablet 11   torsemide (DEMADEX) 20 MG tablet Take 1 tablet (20 mg total) by mouth daily. 30 tablet 3   No current facility-administered medications for this visit.     Past Medical History:  Diagnosis Date   Acute bronchitis and bronchiolitis    Anemia    Iron supplements in the past   Asthma    Inhaler prn;triggered by seasonal changes mostly cold weathert;last asthma  attack years ago   Atrial fibrillation (HCC)    CHF (congestive heart failure) (HCC)    Chronic systolic CHF (congestive heart failure), NYHA class 2 (HCC) 01/2006   Has MDT Defibrillator, EF had improved by 2017 echo   Hypertension    Hypertension    Infection    UTI;not frequent;last infection was in June   Infection    Yeast;not frequent   Migraines    Can't take Procardia XL, causes migraines   Other primary cardiomyopathies    Paroxysmal ventricular tachycardia (HCC)    Varicose veins 1998   Developed during last pregnancy    ROS:   All systems reviewed and negative except  as noted in the HPI.   Past Surgical History:  Procedure Laterality Date   BREAST REDUCTION SURGERY  03/05/1994   d/t back, shoulder, and neck pain (42 F)   CARDIAC CATHETERIZATION     EF 30-35%   CARDIAC CATHETERIZATION N/A 05/04/2015   Procedure: Left Heart Cath and Coronary Angiography;  Surgeon: Lyn Records, MD;  Location: [facility] INVASIVE CV LAB;  Service: Cardiovascular;  Laterality: N/A;   CARDIAC DEFIBRILLATOR PLACEMENT     CESAREAN SECTION  1998   d/t preeclampsia   IMPLANTABLE CARDIOVERTER DEFIBRILLATOR (ICD) GENERATOR CHANGE N/A 09/10/2014   Procedure: ICD GENERATOR CHANGE;  Surgeon: Marinus Maw, MD;  Location: [facility] CATH LAB;  Service:  Cardiovascular;  Laterality: N/A;   INSERT / REPLACE / REMOVE PACEMAKER   02/10/2006   Status post implantable cardioverter-defibrillator insertion  .Marland KitchenMarland KitchenMarland KitchenThe Medtronic Maximo VR, model 7232, single chamber   LAPAROSCOPIC GASTRIC SLEEVE RESECTION WITH HIATAL HERNIA REPAIR N/A 11/24/2016   Procedure: LAPAROSCOPIC GASTRIC SLEEVE RESECTION WITH HIATAL HERNIA REPAIR, UPPER ENDO;  Surgeon: Gaynelle Adu, MD;  Location: [facility] ORS;  Service: General;  Laterality: N/A;   LAPAROSCOPIC OVARIAN CYSTECTOMY Bilateral 11/24/2016   Procedure: LAPAROSCOPIC BILATERAL OVARIAN CYSTECTOMY;  Surgeon: Myna Hidalgo, DO;  Location: [facility];  Service: Gynecology;  Laterality: Bilateral;   REDUCTION MAMMAPLASTY Bilateral      Family History  Problem Relation Age of Onset   Sickle cell trait Daughter    Other Father        Varicose veins   Hypertension Father    Diabetes Father        Controlled w/ diet and exercise   Colon polyps Father        approx 3-4   Asthma Daughter    Seizures Maternal Uncle    Migraines Mother    Ulcers Mother    Rheum arthritis Mother    Colon polyps Mother        approx 2 polyps   Migraines Daughter    Migraines Cousin        Maternal   Uterine cancer Paternal Grandmother        d. 60-61   Prostate cancer Maternal Uncle        dx 69-70; surgery and rad   Lung cancer Maternal Uncle 70       d. 69-70; smoker   Lupus Maternal Aunt    Hypertension Paternal Aunt    Breast cancer Paternal Aunt    Other Daughter        blood transfusion @ birth;platelets were low   Diabetes Maternal Aunt    Lung cancer Maternal Aunt        dx 71-72; +chemo   Diabetes Paternal Aunt        x 2   Breast cancer Paternal Aunt    Diabetes Paternal Uncle    Hypertension Other    Heart disease Other    Cancer Cousin        maternal 1st cousin d. early 28s; NOS cancer   Breast cancer Cousin        maternal 1st cousin dx 52-48   Breast cancer Other        maternal great aunt (MGM's sister) d. breast  cancer in her late 35s   Kidney cancer Cousin        paternal 1st cousin dx 67s; nephrectomy; not a smoker   Ovarian cancer Paternal Aunt 2       paternal aunt (father's maternal half-sister)  Breast cancer Paternal Aunt    Ovarian cancer Paternal Aunt    Uterine cancer Paternal Aunt    Cancer Other        paternal great aunt (PGM's sister) dx NOS cancer   Cancer Other        paternal great uncle (PGM's brother) dx NOS cancer   Colon cancer Neg Hx      Social History   Socioeconomic History   Marital status: Divorced    Spouse name: Lacheryl Raney   Number of children: 1   Years of education: 15   Highest education level: Associate degree: academic program  Occupational History   Occupation: Asst. Production designer, theatre/television/film    Comment: [employer]   Occupation: ACCOUNTANT    Employer: [employer]  Tobacco Use   Smoking status: Never   Smokeless tobacco: Never  Vaping Use   Vaping status: Never Used  Substance and Sexual Activity   Alcohol use: No    Comment: wine on occasions   Drug use: No   Sexual activity: Yes    Partners: Male    Birth control/protection: Condom, None  Other Topics Concern   Not on file  Social History Narrative   ** Merged History Encounter **       Social Determinants of Health   Financial Resource Strain: Not on file  Food Insecurity: Not on file  Transportation Needs: Not on file  Physical Activity: Not on file  Stress: Not on file  Social Connections: Not on file  Intimate Partner Violence: Not on file     BP 126/82   Pulse 78   Ht 5\' 1"  (1.549 m)   Wt 223 lb (101.2 kg)   SpO2 98%   BMI 42.14 kg/m   Physical Exam:  Well appearing NAD HEENT: Unremarkable Neck:  No JVD, no thyromegally Lymphatics:  No adenopathy Back:  No CVA tenderness Lungs:  Clear with no wheezes HEART:  Regular rate rhythm, no murmurs, no rubs, no clicks Abd:  soft, positive bowel sounds, no organomegally, no rebound, no guarding Ext:  2 plus pulses, no edema,  no cyanosis, no clubbing Skin:  No rashes no nodules Neuro:  CN II through XII intact, motor grossly intact  DEVICE  Normal device function.  See PaceArt for details.   Assess/Plan:  VT - she has had no recurrent episodes. She will continue her current meds. 2. ICD - her medtronic device is working normally.  3. Chronic systolic heart failure - her symptoms are class 2. She will continue her current meds. 4. Obesity - her weight is up 5 lbs. In the last 5 years.I asked her to lose weight.   [name].D.

## 2023-03-24 NOTE — Patient Instructions (Signed)

## 2023-03-30 ENCOUNTER — Other Ambulatory Visit (HOSPITAL_COMMUNITY): Payer: Self-pay | Admitting: Cardiology

## 2023-03-31 ENCOUNTER — Other Ambulatory Visit: Payer: Self-pay

## 2023-04-01 ENCOUNTER — Other Ambulatory Visit (HOSPITAL_COMMUNITY): Payer: Self-pay

## 2023-04-01 MED ORDER — TORSEMIDE 20 MG PO TABS
20.0000 mg | ORAL_TABLET | Freq: Every day | ORAL | 11 refills | Status: DC
  Filled 2023-04-01: qty 30, 30d supply, fill #0
  Filled 2023-05-07: qty 30, 30d supply, fill #1
  Filled 2023-06-22: qty 30, 30d supply, fill #2
  Filled 2023-08-10: qty 30, 30d supply, fill #3
  Filled 2023-09-13: qty 30, 30d supply, fill #4
  Filled 2023-11-06: qty 30, 30d supply, fill #5
  Filled 2023-12-22: qty 30, 30d supply, fill #6
  Filled 2024-01-22: qty 30, 30d supply, fill #7
  Filled 2024-02-29: qty 30, 30d supply, fill #8
  Filled 2024-03-30: qty 30, 30d supply, fill #9

## 2023-04-05 ENCOUNTER — Ambulatory Visit: Payer: BC Managed Care – PPO | Attending: Cardiology | Admitting: Cardiology

## 2023-04-05 ENCOUNTER — Encounter: Payer: Self-pay | Admitting: Cardiology

## 2023-04-05 ENCOUNTER — Telehealth: Payer: Self-pay | Admitting: *Deleted

## 2023-04-05 VITALS — BP 130/90 | HR 52 | Ht 61.0 in | Wt 225.8 lb

## 2023-04-05 DIAGNOSIS — I10 Essential (primary) hypertension: Secondary | ICD-10-CM | POA: Diagnosis not present

## 2023-04-05 DIAGNOSIS — G4733 Obstructive sleep apnea (adult) (pediatric): Secondary | ICD-10-CM | POA: Diagnosis not present

## 2023-04-05 NOTE — Addendum Note (Signed)
Addended by: Luellen Pucker on: 04/05/2023 08:46 AM   Modules accepted: Orders

## 2023-04-05 NOTE — Progress Notes (Signed)
SLEEP MEDICINE CONSULT  Date:  04/05/2023   ID:  [name], DOB Jun 27, 1976, MRN [id number]  PCP:  Renaye Rakers, MD  Cardiologist:  Arvilla Meres, MD  Sleep Medicine:  Armanda Magic, MD Electrophysiologist:  None   Chief Complaint:  OSA  History of Present Illness:    [name] is a 47 y.o. female  with a hx of atrial fibrillation, HTN, CHF who was referred by Dr. Gala Romney for evaluation of possible OSA.  She was diagnosed with severe OSA in 2017 with an AHI of 70/hr and started on CPAP.  She is now referred back for sleep medicine consultation to reestablish sleep care.  She tells me that when she was initially dx in 2017 with OSA and started on CPAP and then had gastric sleeve surgery in 2018 and stopped using it.  She then had another sleep study done showing mild OSA with an AHI of 7.5/hr with mild snoring and we ordered an auto CPAP from 4 to 18cm H2O.  This was all during COVID and she never got a new device. She is currently not using a PAP device.   She tells me that she feels tired during the day but has insomnia and anemia and is getting iron infusions.  She is having a hysterectomy in a few weeks to cure her uterine bleeding.  She does not know if she snores but does not wake herself up snoring or gasping for breath.  She has no HAs in the am.     Prior CV studies:   The following studies were reviewed today:  Sleep studies from 2017 and 12/2019  Past Medical History:  Diagnosis Date   Acute bronchitis and bronchiolitis    Anemia    Iron supplements in the past   Asthma    Inhaler prn;triggered by seasonal changes mostly cold weathert;last asthma  attack years ago   Atrial fibrillation (HCC)    CHF (congestive heart failure) (HCC)    Chronic systolic CHF (congestive heart failure), NYHA class 2 (HCC) 01/2006   Has MDT Defibrillator, EF had improved by 2017 echo   Hypertension    Hypertension    Infection    UTI;not frequent;last infection was in June    Infection    Yeast;not frequent   Migraines    Can't take Procardia XL, causes migraines   Other primary cardiomyopathies    Paroxysmal ventricular tachycardia (HCC)    Varicose veins 1998   Developed during last pregnancy   Past Surgical History:  Procedure Laterality Date   BREAST REDUCTION SURGERY  03/05/1994   d/t back, shoulder, and neck pain (42 F)   CARDIAC CATHETERIZATION     EF 30-35%   CARDIAC CATHETERIZATION N/A 05/04/2015   Procedure: Left Heart Cath and Coronary Angiography;  Surgeon: Lyn Records, MD;  Location: [facility] IN[facility]ASIVE CV LAB;  Service: Cardiovascular;  Laterality: N/A;   CARDIAC DEFIBRILLATOR PLACEMENT     CESAREAN SECTION  1998   d/t preeclampsia   IMPLANTABLE CARDIOVERTER DEFIBRILLATOR (ICD) GENERATOR CHANGE N/A 09/10/2014   Procedure: ICD GENERATOR CHANGE;  Surgeon: Marinus Maw, MD;  Location: [facility] CATH LAB;  Service: Cardiovascular;  Laterality: N/A;   INSERT / REPLACE / REMOVE PACEMAKER   02/10/2006   Status post implantable cardioverter-defibrillator insertion  .Marland KitchenMarland KitchenMarland KitchenThe Medtronic Maximo VR, model 7232, single chamber   LAPAROSCOPIC GASTRIC SLEEVE RESECTION WITH HIATAL HERNIA REPAIR N/A 11/24/2016   Procedure: LAPAROSCOPIC GASTRIC SLEEVE RESECTION WITH HIATAL HERNIA REPAIR, UPPER ENDO;  Surgeon: Gaynelle Adu, MD;  Location: [facility] ORS;  Service: General;  Laterality: N/A;   LAPAROSCOPIC OVARIAN CYSTECTOMY Bilateral 11/24/2016   Procedure: LAPAROSCOPIC BILATERAL OVARIAN CYSTECTOMY;  Surgeon: Myna Hidalgo, DO;  Location: [facility] ORS;  Service: Gynecology;  Laterality: Bilateral;   REDUCTION MAMMAPLASTY Bilateral      Current Meds  Medication Sig   albuterol (PROVENTIL HFA;VENTOLIN HFA) 108 (90 Base) MCG/ACT inhaler Inhale 2 puffs into the lungs every 6 (six) hours as needed for wheezing or shortness of breath. Rescue inhaler   bisoprolol (ZEBETA) 5 MG tablet Take 1 tablet (5 mg total) by mouth at bedtime.   Calcium Carbonate-Vit D-Min (QC CALCIUM-MAGNESIUM-ZINC-D3 PO) Take  by mouth in the morning, at noon, and at bedtime.   cetirizine (ZYRTEC) 10 MG tablet Take 10 mg by mouth daily as needed for allergies.    hydrALAZINE (APRESOLINE) 25 MG tablet Take 1 tablet (25 mg total) by mouth 3 (three) times daily.   Iron-FA-B Cmp-C-Biot-Probiotic (FUSION PLUS) CAPS Take 1 capsule by mouth daily.   isosorbide mononitrate (IMDUR) 30 MG 24 hr tablet Take 1 tablet (30 mg total) by mouth daily.   Multiple Vitamin (MULTIVITAMIN WITH MINERALS) TABS tablet Take 1 tablet by mouth daily.   pantoprazole (PROTONIX) 40 MG tablet Take 1 tablet (40 mg total) by mouth 2 (two) times daily before meals   potassium chloride SA (KLOR-CON M20) 20 MEQ tablet Take 1.5 tablets (30 mEq total) by mouth 2 (two) times daily.   progesterone (PROMETRIUM) 200 MG capsule Take 1 capsule (200 mg total) by mouth daily.   sacubitril-valsartan (ENTRESTO) 97-103 MG Take 1 tablet by mouth 2 (two) times daily.   spironolactone (ALDACTONE) 25 MG tablet Take 1 tablet (25 mg total) by mouth daily.   torsemide (DEMADEX) 20 MG tablet Take 1 tablet (20 mg total) by mouth daily.     Allergies:   Oxycodone, Percocet [oxycodone-acetaminophen], Hydrocodone, Hydrocodone-acetaminophen, Keflex [cephalexin], and Oxycodone-acetaminophen   Social History   Tobacco Use   Smoking status: Never   Smokeless tobacco: Never  Vaping Use   Vaping status: Never Used  Substance Use Topics   Alcohol use: No    Comment: wine on occasions   Drug use: No     Family Hx: The patient's family history includes Asthma in her daughter; Breast cancer in her cousin, paternal aunt, paternal aunt, paternal aunt, and another family member; Cancer in her cousin and other family members; Colon polyps in her father and mother; Diabetes in her father, maternal aunt, paternal aunt, and paternal uncle; Heart disease in an other family member; Hypertension in her father, paternal aunt, and another family member; Kidney cancer in her cousin; Lung  cancer in her maternal aunt; Lung cancer (age of onset: 74) in her maternal uncle; Lupus in her maternal aunt; Migraines in her cousin, daughter, and mother; Other in her daughter and father; Ovarian cancer in her paternal aunt; Ovarian cancer (age of onset: 109) in her paternal aunt; Prostate cancer in her maternal uncle; Rheum arthritis in her mother; Seizures in her maternal uncle; Sickle cell trait in her daughter; Ulcers in her mother; Uterine cancer in her paternal aunt and paternal grandmother. There is no history of Colon cancer.  ROS:   Please see the history of present illness.     All other systems reviewed and are negative.   Labs/Other Tests and Data Reviewed:    Recent Labs: 11/26/2022: ALT 10; BUN 18; Creatinine 1.10; Magnesium 1.8; Potassium 4.4; Sodium 141 01/29/2023: Hemoglobin  12.8; Platelets 242   Recent Lipid Panel No results found for: "CHOL", "TRIG", "HDL", "CHOLHDL", "LDLCALC", "LDLDIRECT"  Wt Readings from Last 3 Encounters:  04/05/23 225 lb 12.8 oz (102.4 kg)  03/24/23 223 lb (101.2 kg)  01/29/23 220 lb (99.8 kg)     Objective:    Vital Signs:  BP (!) 130/90   Pulse (!) 52   Ht 5\' 1"  (1.549 m)   Wt 225 lb 12.8 oz (102.4 kg)   SpO2 98%   BMI 42.66 kg/m    GEN: Well nourished, well developed in no acute distress HEENT: Normal NECK: No JVD; No carotid bruits LYMPHATICS: No lymphadenopathy CARDIAC:RRR, no murmurs, rubs, gallops RESPIRATORY:  Clear to auscultation without rales, wheezing or rhonchi  ABDOMEN: Soft, non-tender, non-distended MUSCULOSKELETAL:  No edema; No deformity  SKIN: Warm and dry NEUROLOGIC:  Alert and oriented x 3 PSYCHIATRIC:  Normal affect  ASSESSMENT & PLAN:     OSA -initially dx with severe OSA with an AHI of 70/hr and started CPAP in 2017 but then had gastric sleep and lost 70lbs and stopped using her device -repeat sleep study 2021 with mild OSA and auto CPAP was ordered but it was during COVID and she never heard from the  DME -she has gained 20lbs back  -she is having daytime sleepiness but is anemic and also has insomnia -I will repeat an Itamar HST to see if she still has residual OSA  Hypertension -BP is controlled on exam today -Continue present prescription drug management with bisoprolol 5 mg daily, hydralazine 25 mg 3 times daily, Imdur 30 mg daily, Entresto 97-23 mg twice daily and spironolactone 25 mg daily with as needed refills  Medication Adjustments/Labs and Tests Ordered: Current medicines are reviewed at length with the patient today.  Concerns regarding medicines are outlined above.  Tests Ordered: No orders of the defined types were placed in this encounter.  Medication Changes: No orders of the defined types were placed in this encounter.   Disposition:  Follow up in 8 week(s)after getting on PAP therapy  Signed, Armanda Magic, MD  04/05/2023 8:23 AM    [facility]

## 2023-04-05 NOTE — Patient Instructions (Signed)
Medication Instructions:  Your physician recommends that you continue on your current medications as directed. Please refer to the Current Medication list given to you today.  *If you need a refill on your cardiac medications before your next appointment, please call your pharmacy*   Lab Work: None.  If you have labs (blood work) drawn today and your tests are completely normal, you will receive your results only by: MyChart Message (if you have MyChart) OR A paper copy in the mail If you have any lab test that is abnormal or we need to change your treatment, we will call you to review the results.   Testing/Procedures: Your physician has recommended that you have a sleep study. This test records several body functions during sleep, including: brain activity, eye movement, oxygen and carbon dioxide blood levels, heart rate and rhythm, breathing rate and rhythm, the flow of air through your mouth and nose, snoring, body muscle movements, and chest and belly movement.    Follow-Up: At [facility], you and your health needs are our priority.  As part of our continuing mission to provide you with exceptional heart care, we have created designated Provider Care Teams.  These Care Teams include your primary Cardiologist (physician) and Advanced Practice Providers (APPs -  Physician Assistants and Nurse Practitioners) who all work together to provide you with the care you need, when you need it.  We recommend signing up for the patient portal called "MyChart".  Sign up information is provided on this After Visit Summary.  MyChart is used to connect with patients for Virtual Visits (Telemedicine).  Patients are able to view lab/test results, encounter notes, upcoming appointments, etc.  Non-urgent messages can be sent to your provider as well.   To learn more about what you can do with MyChart, go to [email or web address].    Your next appointment will be dependent on the results of  your sleep study and it will be with   Provider:   Dr. Traci Turner, MD   

## 2023-04-05 NOTE — Telephone Encounter (Signed)
Patient agreement reviewed and signed on 04/05/2023.  WatchPAT issued to patient on 04/05/2023 by Danielle Rankin, CMA.  Patient aware to not open the WatchPAT box until contacted with the activation PIN. Patient profile initialized in CloudPAT on 04/05/2023 by Danielle Rankin, CMA. Device serial number: [id number]  Please list Reason for Call as Advice Only and type "WatchPAT issued to patient" in the comment box.

## 2023-04-12 ENCOUNTER — Encounter: Payer: Self-pay | Admitting: Internal Medicine

## 2023-04-12 ENCOUNTER — Encounter (HOSPITAL_BASED_OUTPATIENT_CLINIC_OR_DEPARTMENT_OTHER): Payer: Self-pay | Admitting: Obstetrics and Gynecology

## 2023-04-12 NOTE — Progress Notes (Signed)
PERIOPERATIVE PRESCRIPTION FOR IMPLANTED CARDIAC DEVICE PROGRAMMING  Patient Information: Name:  [name]  DOB:  09-10-1975  MRN:  [id number]    Planned Procedure:  Hysterectomy abdominal with salpingectomy bilateral  Surgeon:  Dr. Marvel Plan  Date of Procedure:  04/27/2023  Cautery will be used.  Position during surgery:  supine   Please send documentation back to:  [facility] (Fax # [phone])   Device Information:  Clinic EP Physician:  Lewayne Bunting, MD   Device Type:  Defibrillator Manufacturer and Phone #:  Medtronic: [id number] Pacemaker Dependent?:  No. Date of Last Device Check:  03/24/2023 Normal Device Function?:  Yes.    Electrophysiologist's Recommendations:  Have magnet available. Provide continuous ECG monitoring when magnet is used or reprogramming is to be performed.  Procedure may interfere with device function.  Magnet should be placed over device during procedure.  Per Device Clinic Standing Orders, Lenor Coffin, RN  1:09 PM 04/12/2023

## 2023-04-14 ENCOUNTER — Encounter: Payer: Self-pay | Admitting: Cardiology

## 2023-04-14 ENCOUNTER — Encounter (HOSPITAL_BASED_OUTPATIENT_CLINIC_OR_DEPARTMENT_OTHER): Payer: Self-pay | Admitting: Obstetrics and Gynecology

## 2023-04-14 ENCOUNTER — Other Ambulatory Visit: Payer: Self-pay

## 2023-04-14 NOTE — Progress Notes (Addendum)
Spoke w/ via phone for pre-op interview---[name] needs dos----  UPT per anesthesia, surgeon orders pending       [name] results------04/21/23 [name] appt for cbc, type & screen, bmp, 01/29/23 EKG in Epic & chart COVID test -----patient states asymptomatic no test needed Arrive at -------0530 on Tuesday, 04/27/2023 NPO after MN NO Solid Food.  Clear liquids from MN until---0430 Med rec completed Medications to take morning of surgery -----Bisoprolol, Hydralazine, Protonix Diabetic medication -----none Patient instructed no nail polish to be worn day of surgery Patient instructed to bring photo id and insurance card day of surgery Patient aware to have Driver (ride ) / caregiver    for 24 hours after surgery - daughter, [name] Patient Special Instructions -----Extended / overnight stay instructions given.  Pre-Op special Instructions -----Requested orders from Dr. Vincente Poli on 04/12/23. Patient verbalized understanding of instructions that were given at this phone interview. Patient denies chest pain, sob, fever, cough at the interview.    Anesthesia Review:  PCP: Dr. Renaye Rakers Cardiologist : Dr. Arvilla Meres, Heart Failure, LOV 01/29/23 with [name], NP  and Dr. Sharrell Ku, LOV 03/24/23 Chest x-ray : EKG : 01/29/23 in Epic & chart Echo :01/29/23 Echo in Epic, EF 45 - 50% Stress test: 02/21/2014 in Epic  Cardiac Cath : 05/04/2015 in Epic  Activity level:  Sleep Study/ CPAP : Dr. Armanda Magic, LOV 04/05/2023 Hx of severe sleep apnea before gastric sleeve. Patient had another sleep study that showed mild sleep apnea. Patient does not wear a CPAP. Patient is in the process of getting scheduled for another test. She denies snoring. Fasting Blood Sugar :      / Checks Blood Sugar -- times a day:   Blood Thinner/ Instructions /Last Dose: ASA / Instructions/ Last Dose :   Patient has on-demand cardiac pacemaker and defibrillator. Device instructions dated 04/12/23 in Epic & chart.  Cardiac  clearance dated 03/05/23 by Dr. Pamella Pert in chart.

## 2023-04-14 NOTE — Progress Notes (Signed)
Your procedure is scheduled on Tuesday, April 27, 2023.  Report to [facility] [address] [place] AT  5:30 AM.   Call this number if you have problems the morning of surgery  :[phone].   OUR ADDRESS IS [address].  WE ARE LOCATED IN [facility].  PLEASE BRING YOUR INSURANCE CARD AND PHOTO ID DAY OF SURGERY.  ONLY 2 PEOPLE ARE ALLOWED IN  WAITING  ROOM                                      REMEMBER:  DO NOT EAT FOOD, CANDY GUM OR MINTS  AFTER MIDNIGHT THE NIGHT BEFORE YOUR SURGERY . YOU MAY HAVE CLEAR LIQUIDS FROM MIDNIGHT THE NIGHT BEFORE YOUR SURGERY UNTIL  4:30 AM. NO CLEAR LIQUIDS AFTER   4:30 AM DAY OF SURGERY.  YOU MAY  BRUSH YOUR TEETH MORNING OF SURGERY AND RINSE YOUR MOUTH OUT, NO CHEWING GUM CANDY OR MINTS.     CLEAR LIQUID DIET    Allowed      Water                                                                   Coffee and tea, regular and decaf  (NO cream or milk products of any type, may sweeten)                         Carbonated beverages, regular and diet                                    Sports drinks like Gatorade _____________________________________________________________________     TAKE ONLY THESE MEDICATIONS MORNING OF SURGERY: Bisoprolol, Hydralazine, Protonix                                        DO NOT WEAR JEWERLY/  METAL/  PIERCINGS (INCLUDING NO PLASTIC PIERCINGS) DO NOT WEAR LOTIONS, POWDERS, PERFUMES OR NAIL POLISH ON YOUR FINGERNAILS. TOENAIL POLISH IS OK TO WEAR. DO NOT SHAVE FOR 48 HOURS PRIOR TO DAY OF SURGERY.  CONTACTS, GLASSES, OR DENTURES MAY NOT BE WORN TO SURGERY.  REMEMBER: NO SMOKING, VAPING ,  DRUGS OR ALCOHOL FOR 24 HOURS BEFORE YOUR SURGERY.                                    [name] IS NOT RESPONSIBLE  FOR ANY BELONGINGS.                                                                    [name] Kitchen           [name] - Preparing  for Surgery Before surgery, you can play an important role.   Because skin is not sterile, your skin needs to be as free of germs as possible.  You can reduce the number of germs on your skin by washing with CHG (chlorahexidine gluconate) soap before surgery.  CHG is an antiseptic cleaner which kills germs and bonds with the skin to continue killing germs even after washing. Please DO NOT use if you have an allergy to CHG or antibacterial soaps.  If your skin becomes reddened/irritated stop using the CHG and inform your nurse when you arrive at Short Stay. Do not shave (including legs and underarms) for at least 48 hours prior to the first CHG shower.  You may shave your face/neck. Please follow these instructions carefully:  1.  Shower with CHG Soap the night before surgery and the  morning of Surgery.  2.  If you choose to wash your hair, wash your hair first as usual with your  normal  shampoo.  3.  After you shampoo, rinse your hair and body thoroughly to remove the  shampoo.                                        4.  Use CHG as you would any other liquid soap.  You can apply chg directly  to the skin and wash , chg soap provided, night before and morning of your surgery.  5.  Apply the CHG Soap to your body ONLY FROM THE NECK DOWN.   Do not use on face/ open                           Wound or open sores. Avoid contact with eyes, ears mouth and genitals (private parts).                       Wash face,  Genitals (private parts) with your normal soap.             6.  Wash thoroughly, paying special attention to the area where your surgery  will be performed.  7.  Thoroughly rinse your body with warm water from the neck down.  8.  DO NOT shower/wash with your normal soap after using and rinsing off  the CHG Soap.             9.  Pat yourself dry with a clean towel.            10.  Wear clean pajamas.            11.  Place clean sheets on your bed the night of your first shower and do not  sleep with pets. Day of Surgery : Do not apply any lotions/ powders  the morning of surgery.  Please wear clean clothes to the hospital/surgery center.  IF YOU HAVE ANY SKIN IRRITATION OR PROBLEMS WITH THE SURGICAL SOAP, PLEASE GET A BAR OF GOLD DIAL SOAP AND SHOWER THE NIGHT BEFORE YOUR SURGERY AND THE MORNING OF YOUR SURGERY. PLEASE LET THE NURSE KNOW MORNING OF YOUR SURGERY IF YOU HAD ANY PROBLEMS WITH THE SURGICAL SOAP.   YOUR SURGEON MAY HAVE REQUESTED EXTENDED RECOVERY TIME AFTER YOUR SURGERY. IT COULD BE A  JUST A FEW HOURS  UP TO AN OVERNIGHT STAY.  YOUR SURGEON SHOULD HAVE DISCUSSED THIS WITH YOU  PRIOR TO YOUR SURGERY. IN THE EVENT YOU NEED TO STAY OVERNIGHT PLEASE REFER TO THE FOLLOWING GUIDELINES. YOU MAY HAVE UP TO 4 VISITORS  MAY VISIT IN THE EXTENDED RECOVERY ROOM UNTIL 800 PM ONLY.  ONE  VISITOR AGE 7 AND OVER MAY SPEND THE NIGHT AND MUST BE IN EXTENDED RECOVERY ROOM NO LATER THAN 800 PM . YOUR DISCHARGE TIME AFTER YOU SPEND THE NIGHT IS 900 AM THE MORNING AFTER YOUR SURGERY. YOU MAY PACK A SMALL OVERNIGHT BAG WITH TOILETRIES FOR YOUR OVERNIGHT STAY IF YOU WISH.  REGARDLESS OF IF YOU STAY OVER NIGHT OR ARE DISCHARGED THE SAME DAY YOU WILL BE REQUIRED TO HAVE A RESPONSIBLE ADULT (18 YRS OLD OR OLDER) STAY WITH YOU FOR AT LEAST THE FIRST 24 HOURS  YOUR PRESCRIPTION MEDICATIONS WILL BE PROVIDED DURING YOUR HOSPITAL STAY.  ________________________________________________________________________                                                        QUESTIONS Mechele Claude PRE OP NURSE PHONE [phone].

## 2023-04-15 NOTE — Telephone Encounter (Signed)
Prior Authorization for [facility] sent to [facility] via web portal. Tracking Number . READY-do not require Pre-Authorization by [employer].  Ordering provider: DR [name] Associated diagnoses: G47.33-OSA Authorization: do not require Pre-Authorization by [employer]. Patient notified of PIN (1234) on 04/15/2023 via Notification Method: phone.

## 2023-04-19 ENCOUNTER — Encounter (INDEPENDENT_AMBULATORY_CARE_PROVIDER_SITE_OTHER): Payer: BC Managed Care – PPO | Admitting: Cardiology

## 2023-04-19 DIAGNOSIS — G4733 Obstructive sleep apnea (adult) (pediatric): Secondary | ICD-10-CM

## 2023-04-20 ENCOUNTER — Ambulatory Visit: Payer: BC Managed Care – PPO | Attending: Cardiology

## 2023-04-20 DIAGNOSIS — G4733 Obstructive sleep apnea (adult) (pediatric): Secondary | ICD-10-CM

## 2023-04-20 NOTE — Procedures (Signed)
Patient Information Study Date: 04/19/2023 Patient Name: [name] Patient ID: [id number] Birth Date: [birth year] Age: 46 Gender: Female BMI: 42.5 (W=225 lb, H=5' 1'') Stopbang: 4 Referring Physician: Armanda Magic, MD  TEST DESCRIPTION: Home sleep apnea testing was completed using the WatchPat, a Type 1 device, utilizing peripheral arterial tonometry (PAT), chest movement, actigraphy, pulse oximetry, pulse rate, body position and snore. AHI was calculated with apnea and hypopnea using valid sleep time as the denominator. RDI includes apneas, hypopneas, and RERAs. The data acquired and the scoring of sleep and all associated events were performed in accordance with the recommended standards and specifications as outlined in the AASM Manual for the Scoring of Sleep and Associated Events 2.2.0 (2015).   FINDINGS:   1. Mild Obstructive Sleep Apnea with AHI 8.4/hr overall but moderate during REM sleep with REM AHI 26.4/hr.   2. No Central Sleep Apnea with pAHIc 0/hr.   3. Oxygen desaturations as low as 84%.   4. Mild snoring was present. O2 sats were < 88% for 1.5 min.   5. Total sleep time was 5 hrs and 34 min.   6. 29.5% of total sleep time was spent in REM sleep.   7. Normal sleep onset latency at 10 min.   8. Shortened REM sleep onset latency at 65 min.   9. Total awakenings were 2.  10. Arrhythmia detection:  None  DIAGNOSIS: Mild to Moderate Obstructive Sleep Apnea (G47.33)  RECOMMENDATIONS:   1.  Clinical correlation of these findings is necessary.  The decision to treat obstructive sleep apnea (OSA) is usually based on the presence of apnea symptoms or the presence of associated medical conditions such as Hypertension, Congestive Heart Failure, Atrial Fibrillation or Obesity.  The most common symptoms of OSA are snoring, gasping for breath while sleeping, daytime sleepiness and fatigue.   2.  Initiating apnea therapy is recommended given the presence of symptoms and/or  associated conditions. Recommend proceeding with one of the following:     a.  Auto-CPAP therapy with a pressure range of 5-20cm H2O.     b.  An oral appliance (OA) that can be obtained from certain dentists with expertise in sleep medicine.  These are primarily of use in non-obese patients with mild and moderate disease.     c.  An ENT consultation which may be useful to look for specific causes of obstruction and possible treatment options.     d.  If patient is intolerant to PAP therapy, consider referral to ENT for evaluation for hypoglossal nerve stimulator.   3.  Close follow-up is necessary to ensure success with CPAP or oral appliance therapy for maximum benefit.  4.  A follow-up oximetry study on CPAP is recommended to assess the adequacy of therapy and determine the need for supplemental oxygen or the potential need for Bi-level therapy.  An arterial blood gas to determine the adequacy of baseline ventilation and oxygenation should also be considered.  5.  Healthy sleep recommendations include:  adequate nightly sleep (normal 7-9 hrs/night), avoidance of caffeine after noon and alcohol near bedtime, and maintaining a sleep environment that is cool, dark and quiet.  6.  Weight loss for overweight patients is recommended.  Even modest amounts of weight loss can significantly improve the severity of sleep apnea.  7.  Snoring recommendations include:  weight loss where appropriate, side sleeping, and avoidance of alcohol before bed.  8.  Operation of motor vehicle should be avoided when sleepy.  Signature: Armanda Magic,  MD; Ruthann Cancer; Diplomat, American Board of Sleep Medicine Electronically Signed: 04/20/2023 8:39:47 AM

## 2023-04-21 ENCOUNTER — Encounter (HOSPITAL_COMMUNITY)
Admission: RE | Admit: 2023-04-21 | Discharge: 2023-04-21 | Disposition: A | Discharge status: Home / Self Care | Payer: BC Managed Care – PPO | Source: Ambulatory Visit | Attending: Obstetrics and Gynecology

## 2023-04-21 DIAGNOSIS — Z01812 Encounter for preprocedural laboratory examination: Secondary | ICD-10-CM | POA: Diagnosis present

## 2023-04-21 DIAGNOSIS — D219 Benign neoplasm of connective and other soft tissue, unspecified: Secondary | ICD-10-CM | POA: Insufficient documentation

## 2023-04-21 DIAGNOSIS — Z01818 Encounter for other preprocedural examination: Secondary | ICD-10-CM

## 2023-04-21 DIAGNOSIS — D500 Iron deficiency anemia secondary to blood loss (chronic): Secondary | ICD-10-CM | POA: Diagnosis not present

## 2023-04-21 LAB — CBC
HCT: 41.1 % (ref 36.0–46.0)
Hemoglobin: 12.9 g/dL (ref 12.0–15.0)
MCH: 28.4 pg (ref 26.0–34.0)
MCHC: 31.4 g/dL (ref 30.0–36.0)
MCV: 90.5 fL (ref 80.0–100.0)
Platelets: 239 10*3/uL (ref 150–400)
RBC: 4.54 MIL/uL (ref 3.87–5.11)
RDW: 15.2 % (ref 11.5–15.5)
WBC: 7.4 10*3/uL (ref 4.0–10.5)
nRBC: 0 % (ref 0.0–0.2)

## 2023-04-21 LAB — BASIC METABOLIC PANEL
Anion gap: 10 (ref 5–15)
BUN: 20 mg/dL (ref 6–20)
CO2: 26 mmol/L (ref 22–32)
Calcium: 8.7 mg/dL — ABNORMAL LOW (ref 8.9–10.3)
Chloride: 103 mmol/L (ref 98–111)
Creatinine, Ser: 1.2 mg/dL — ABNORMAL HIGH (ref 0.44–1.00)
GFR, Estimated: 56 mL/min — ABNORMAL LOW (ref >60)
Glucose, Bld: 118 mg/dL — ABNORMAL HIGH (ref 70–99)
Potassium: 3.4 mmol/L — ABNORMAL LOW (ref 3.5–5.1)
Sodium: 139 mmol/L (ref 135–145)

## 2023-04-21 LAB — TYPE AND SCREEN
ABO/RH(D): O POS
Antibody Screen: NEGATIVE

## 2023-04-21 LAB — HIV ANTIBODY (ROUTINE TESTING W REFLEX): HIV Screen 4th Generation wRfx: NONREACTIVE

## 2023-04-23 ENCOUNTER — Ambulatory Visit: Payer: BC Managed Care – PPO

## 2023-04-23 DIAGNOSIS — I428 Other cardiomyopathies: Secondary | ICD-10-CM | POA: Diagnosis not present

## 2023-04-24 LAB — CUP PACEART REMOTE DEVICE CHECK
Battery Remaining Longevity: 38 mo
Battery Voltage: 2.96 V
Brady Statistic RV Percent Paced: 0.01 %
Date Time Interrogation Session: 20240926033524
HighPow Impedance: 47 Ohm
HighPow Impedance: 57 Ohm
Implantable Lead Connection Status: 753985
Implantable Lead Implant Date: 20070718
Implantable Lead Location: 753860
Implantable Pulse Generator Implant Date: 20160215
Lead Channel Impedance Value: 285 Ohm
Lead Channel Impedance Value: 342 Ohm
Lead Channel Pacing Threshold Amplitude: 1.25 V
Lead Channel Pacing Threshold Pulse Width: 0.4 ms
Lead Channel Sensing Intrinsic Amplitude: 17.625 mV
Lead Channel Sensing Intrinsic Amplitude: 17.625 mV
Lead Channel Setting Pacing Amplitude: 3.5 V
Lead Channel Setting Pacing Pulse Width: 0.7 ms
Lead Channel Setting Sensing Sensitivity: 0.3 mV

## 2023-04-26 ENCOUNTER — Encounter (HOSPITAL_COMMUNITY): Payer: Self-pay | Admitting: Obstetrics and Gynecology

## 2023-04-26 MED ORDER — GENTAMICIN SULFATE 40 MG/ML IJ SOLN
5.0000 mg/kg | INTRAVENOUS | Status: AC
  Administered 2023-04-27: 345.2 mg via INTRAVENOUS
  Filled 2023-04-26: qty 8.75

## 2023-04-26 MED ORDER — CLINDAMYCIN PHOSPHATE 900 MG/50ML IV SOLN
900.0000 mg | INTRAVENOUS | Status: AC
  Administered 2023-04-27: 900 mg via INTRAVENOUS
  Filled 2023-04-26: qty 50

## 2023-04-26 NOTE — Progress Notes (Signed)
Anesthesia Chart Review   Case: [id number] Date/Time: 04/27/23 0730   Procedure: HYSTERECTOMY ABDOMINAL WITH SALPINGECTOMY (Bilateral)   Anesthesia type: General   Pre-op diagnosis: MENORRHAGIA   Location: [facility] ROOM 06 / [facility]   Surgeons: Marcelle Overlie, MD       DISCUSSION:47 y.o. never smoker with h/o HTN, asthma, sleep apnea, CHF, AICD in place (device orders in 04/12/2023 progress note), atrial fibrillation, s/p gastric sleeve, menorrhagia scheduled for above procedure 04/27/2023 with Dr. Jeanene Erb.   Clearance received from cardiology which states pt is cleared for surgery.   Pt last seen by cardiology 04/05/2023. BP controlled.  Home sleep apnea testing completed, CPAP therapy initiated per 04/19/2023 note.   Last seen by EP 03/24/2023. Per notes no recurrent VT, ICD working normally, CHF class 2.   VS: BP 133/77   Pulse 71   Resp 18   Ht 5' 1.25" (1.556 m)   Wt 99.4 kg   SpO2 100%   BMI 41.07 kg/m   PROVIDERS: Renaye Rakers, MD is PCP   Sleep Medicine:  Armanda Magic, MD  LABS: Labs reviewed: Acceptable for surgery. (all labs ordered are listed, but only abnormal results are displayed)  Labs Reviewed  BASIC METABOLIC PANEL - Abnormal; Notable for the following components:      Result Value   Potassium 3.4 (*)    Glucose, Bld 118 (*)    Creatinine, Ser 1.20 (*)    Calcium 8.7 (*)    GFR, Estimated 56 (*)    All other components within normal limits  CBC  HIV ANTIBODY (ROUTINE TESTING W REFLEX)  TYPE AND SCREEN     IMAGES:   EKG:   CV: Echo 01/29/2023  1. Left ventricular ejection fraction, by estimation, is 45 to 50%. The  left ventricle has mildly decreased function. Left ventricular endocardial  border not optimally defined to evaluate regional wall motion. Left  ventricular diastolic parameters are  consistent with Grade I diastolic dysfunction (impaired relaxation).   2. Right ventricular systolic function is normal. The right ventricular  size is  normal. Tricuspid regurgitation signal is inadequate for assessing  PA pressure.   3. The mitral valve was not well visualized. No evidence of mitral valve  regurgitation. No evidence of mitral stenosis.   4. The aortic valve was not well visualized. Aortic valve regurgitation  is not visualized. No aortic stenosis is present.   5. The inferior vena cava is normal in size with greater than 50%  respiratory variability, suggesting right atrial pressure of 3 mmHg.   Comparison(s): No significant change from prior study.  Past Medical History:  Diagnosis Date   Anemia    2024 iron infusions x 2, Follows w/ Dr. Leatha Gilding, hematology/oncology.   Asthma    asymptomatic   Atrial fibrillation (HCC)    hx of   CHF (congestive heart failure) (HCC)    Follows w/ Dr. Derenda Fennel, @ HF clinic. 01/29/23 Echo EF 45 - 50%   Chronic bronchitis (HCC)    2024 - Patient states she has not had bronchitis in many years.   Chronic systolic CHF (congestive heart failure), NYHA class 2 (HCC) 01/2006   Has MDT Defibrillator, EF had improved by 2017 echo   Gestational diabetes 2013   History of hiatal hernia    repaired 11/24/16   Hypertension    Follows w/ cardiologist.   Migraines    Has not had a migraine in many years per pt on 04/14/23.   Other  primary cardiomyopathies    Paroxysmal ventricular tachycardia (HCC)    Pneumonia 09/04/2016   Community-acquired   Sleep apnea    Follows w/ Dr. Armanda Magic, cardiology. No cpap at the present (03/2023). Patient states she is waiting to be scheduled for a sleep study.   Varicose veins 1998   Developed during last pregnancy    Past Surgical History:  Procedure Laterality Date   CARDIAC CATHETERIZATION     EF 30-35%   CARDIAC CATHETERIZATION N/A 05/04/2015   Procedure: Left Heart Cath and Coronary Angiography;  Surgeon: Lyn Records, MD;  Location: [facility] INVASIVE CV LAB;  Service: Cardiovascular;  Laterality: N/A;   CARDIAC DEFIBRILLATOR PLACEMENT  2007    CESAREAN SECTION     1998 & 2014   IMPLANTABLE CARDIOVERTER DEFIBRILLATOR (ICD) GENERATOR CHANGE N/A 09/10/2014   Procedure: ICD GENERATOR CHANGE;  Surgeon: Marinus Maw, MD;  Location: [facility] CATH LAB;  Service: Cardiovascular;  Laterality: N/A;   INSERT / REPLACE / REMOVE PACEMAKER  02/10/2006   Status post implantable cardioverter-defibrillator insertion  .Marland KitchenMarland KitchenMarland KitchenThe Medtronic Maximo VR, model 7232, single chamber   LAPAROSCOPIC GASTRIC SLEEVE RESECTION WITH HIATAL HERNIA REPAIR N/A 11/24/2016   Procedure: LAPAROSCOPIC GASTRIC SLEEVE RESECTION WITH HIATAL HERNIA REPAIR, UPPER ENDO;  Surgeon: Gaynelle Adu, MD;  Location: [facility];  Service: General;  Laterality: N/A;   LAPAROSCOPIC OVARIAN CYSTECTOMY Bilateral 11/24/2016   Procedure: LAPAROSCOPIC BILATERAL OVARIAN CYSTECTOMY;  Surgeon: Myna Hidalgo, DO;  Location: [facility];  Service: Gynecology;  Laterality: Bilateral;   REDUCTION MAMMAPLASTY Bilateral 1995    MEDICATIONS:  bisoprolol (ZEBETA) 5 MG tablet   Calcium Carbonate-Vit D-Min (QC CALCIUM-MAGNESIUM-ZINC-D3 PO)   cetirizine (ZYRTEC) 10 MG tablet   hydrALAZINE (APRESOLINE) 25 MG tablet   Iron-FA-B Cmp-C-Biot-Probiotic (FUSION PLUS) CAPS   isosorbide mononitrate (IMDUR) 30 MG 24 hr tablet   Multiple Vitamin (MULTIVITAMIN WITH MINERALS) TABS tablet   pantoprazole (PROTONIX) 40 MG tablet   potassium chloride SA (KLOR-CON M20) 20 MEQ tablet   progesterone (PROMETRIUM) 200 MG capsule   sacubitril-valsartan (ENTRESTO) 97-103 MG   spironolactone (ALDACTONE) 25 MG tablet   torsemide (DEMADEX) 20 MG tablet   No current facility-administered medications for this encounter.    Jodell Cipro Ward, PA-C [facility] [phone]

## 2023-04-26 NOTE — Anesthesia Preprocedure Evaluation (Addendum)
Anesthesia Evaluation  Patient identified by MRN, date of birth, ID band Patient awake    Reviewed: Allergy & Precautions, H&P , NPO status , Patient's Chart, lab work & pertinent test results  Airway Mallampati: III  TM Distance: >3 FB Neck ROM: Full    Dental no notable dental hx.    Pulmonary asthma , sleep apnea    Pulmonary exam normal breath sounds clear to auscultation       Cardiovascular hypertension, +CHF  Normal cardiovascular exam+ dysrhythmias Atrial Fibrillation + pacemaker + Cardiac Defibrillator  Rhythm:Regular Rate:Normal  Nonischemic cardiomyopathy   Neuro/Psych  Headaches  negative psych ROS   GI/Hepatic Neg liver ROS, hiatal hernia,,,Hx gastric sleeve   Endo/Other  diabetes    Renal/GU negative Renal ROS  negative genitourinary   Musculoskeletal negative musculoskeletal ROS (+)    Abdominal   Peds negative pediatric ROS (+)  Hematology  (+) Blood dyscrasia, anemia   Anesthesia Other Findings EF 25% in 2016 secondary to non-ischemic cardiomyopathy. EF recovery to ~45%. Medtronic Evera ICD in place  Reproductive/Obstetrics negative OB ROS                             Anesthesia Physical Anesthesia Plan  ASA: 3  Anesthesia Plan: General   Post-op Pain Management:    Induction: Intravenous  PONV Risk Score and Plan: Ondansetron and Dexamethasone  Airway Management Planned: Oral ETT  Additional Equipment:   Intra-op Plan:   Post-operative Plan: Extubation in OR  Informed Consent: I have reviewed the patients History and Physical, chart, labs and discussed the procedure including the risks, benefits and alternatives for the proposed anesthesia with the patient or authorized representative who has indicated his/her understanding and acceptance.     Dental advisory given  Plan Discussed with: CRNA  Anesthesia Plan Comments:        Anesthesia Quick  Evaluation

## 2023-04-26 NOTE — Progress Notes (Signed)
For Short Stay: COVID SWAB appointment date:  Bowel Prep reminder:   For Anesthesia: PCP - Renaye Rakers, MD  Cardiologist - Bensimhon, Bevelyn Buckles, MD   Chest x-ray -  EKG - 01/29/23 Stress Test -  ECHO - 01/29/23 Cardiac Cath - 01/29/23 Pacemaker/ICD device last checked: 04/22/23 Pacemaker orders received: 04/12/23: EPIC Device Rep notified:  Spinal Cord Stimulator: N/A  Sleep Study - Yes CPAP - NO  Fasting Blood Sugar - N/A Checks Blood Sugar __0___ times a day Date and result of last Hgb A1c-  Last dose of GLP1 agonist- N/A GLP1 instructions:   Last dose of SGLT-2 inhibitors- N/A SGLT-2 instructions:   Blood Thinner Instructions: N/A Aspirin Instructions: Last Dose:  Activity level: Can go up a flight of stairs and activities of daily living without stopping and without chest pain and/or shortness of breath   Able to exercise without chest pain and/or shortness of breath  Anesthesia review: CHF,Afib,OSA(NO CPAP).  Patient denies shortness of breath, fever, cough and chest pain at PAT appointment   Patient verbalized understanding of instructions that were given to them at the PAT appointment. Patient was also instructed that they will need to review over the PAT instructions again at home before surgery.

## 2023-04-26 NOTE — Progress Notes (Signed)
Reviewed pt's PMH including: paroxysmal VT, afib, CHF NYHA class 2, OSA, and current on-demand Medtronic pacemaker and defibrillator with anesthesiologist Dr. Bradley Ferris. Per Dr. Ellender/anesthesia review, pt is not a candidate for outpatient/ambulatory surgery at [facility] and needs to be moved to the main OR. [facility] nursing supervisors Juanda Crumble and [place] notified.  Frances Furbish, RN

## 2023-04-27 ENCOUNTER — Encounter (HOSPITAL_COMMUNITY): Payer: Self-pay | Admitting: Obstetrics and Gynecology

## 2023-04-27 ENCOUNTER — Encounter (HOSPITAL_COMMUNITY)
Admission: RE | Disposition: A | Discharge status: Home / Self Care | Payer: Self-pay | Source: Ambulatory Visit | Attending: Obstetrics and Gynecology

## 2023-04-27 ENCOUNTER — Ambulatory Visit (HOSPITAL_COMMUNITY): Payer: Self-pay | Admitting: Anesthesiology

## 2023-04-27 ENCOUNTER — Other Ambulatory Visit: Payer: Self-pay

## 2023-04-27 ENCOUNTER — Ambulatory Visit (HOSPITAL_COMMUNITY): Payer: BC Managed Care – PPO | Admitting: Anesthesiology

## 2023-04-27 ENCOUNTER — Ambulatory Visit (HOSPITAL_COMMUNITY)
Admission: RE | Admit: 2023-04-27 | Discharge: 2023-04-28 | Disposition: A | Discharge status: Home / Self Care | Payer: BC Managed Care – PPO | Source: Ambulatory Visit | Attending: Obstetrics and Gynecology | Admitting: Obstetrics and Gynecology

## 2023-04-27 DIAGNOSIS — I110 Hypertensive heart disease with heart failure: Secondary | ICD-10-CM | POA: Insufficient documentation

## 2023-04-27 DIAGNOSIS — Z9884 Bariatric surgery status: Secondary | ICD-10-CM | POA: Diagnosis not present

## 2023-04-27 DIAGNOSIS — I4891 Unspecified atrial fibrillation: Secondary | ICD-10-CM | POA: Insufficient documentation

## 2023-04-27 DIAGNOSIS — J45909 Unspecified asthma, uncomplicated: Secondary | ICD-10-CM | POA: Diagnosis not present

## 2023-04-27 DIAGNOSIS — E119 Type 2 diabetes mellitus without complications: Secondary | ICD-10-CM | POA: Insufficient documentation

## 2023-04-27 DIAGNOSIS — N920 Excessive and frequent menstruation with regular cycle: Secondary | ICD-10-CM | POA: Insufficient documentation

## 2023-04-27 DIAGNOSIS — I509 Heart failure, unspecified: Secondary | ICD-10-CM | POA: Diagnosis not present

## 2023-04-27 DIAGNOSIS — D259 Leiomyoma of uterus, unspecified: Secondary | ICD-10-CM | POA: Diagnosis not present

## 2023-04-27 DIAGNOSIS — D500 Iron deficiency anemia secondary to blood loss (chronic): Secondary | ICD-10-CM

## 2023-04-27 DIAGNOSIS — D219 Benign neoplasm of connective and other soft tissue, unspecified: Secondary | ICD-10-CM | POA: Diagnosis present

## 2023-04-27 DIAGNOSIS — G4730 Sleep apnea, unspecified: Secondary | ICD-10-CM | POA: Diagnosis not present

## 2023-04-27 DIAGNOSIS — Z01818 Encounter for other preprocedural examination: Secondary | ICD-10-CM

## 2023-04-27 DIAGNOSIS — I10 Essential (primary) hypertension: Secondary | ICD-10-CM

## 2023-04-27 DIAGNOSIS — Z95810 Presence of automatic (implantable) cardiac defibrillator: Secondary | ICD-10-CM | POA: Insufficient documentation

## 2023-04-27 HISTORY — DX: Presence of automatic (implantable) cardiac defibrillator: Z95.810

## 2023-04-27 HISTORY — DX: Sleep apnea, unspecified: G47.30

## 2023-04-27 HISTORY — PX: HYSTERECTOMY ABDOMINAL WITH SALPINGECTOMY: SHX6725

## 2023-04-27 HISTORY — DX: Personal history of other diseases of the digestive system: Z87.19

## 2023-04-27 HISTORY — DX: Unspecified chronic bronchitis: J42

## 2023-04-27 LAB — CBC
HCT: 40 % (ref 36.0–46.0)
Hemoglobin: 12.1 g/dL (ref 12.0–15.0)
MCH: 27.2 pg (ref 26.0–34.0)
MCHC: 30.3 g/dL (ref 30.0–36.0)
MCV: 89.9 fL (ref 80.0–100.0)
Platelets: 251 10*3/uL (ref 150–400)
RBC: 4.45 MIL/uL (ref 3.87–5.11)
RDW: 14.9 % (ref 11.5–15.5)
WBC: 7.8 10*3/uL (ref 4.0–10.5)
nRBC: 0 % (ref 0.0–0.2)

## 2023-04-27 LAB — BASIC METABOLIC PANEL
Anion gap: 9 (ref 5–15)
BUN: 21 mg/dL — ABNORMAL HIGH (ref 6–20)
CO2: 26 mmol/L (ref 22–32)
Calcium: 8.3 mg/dL — ABNORMAL LOW (ref 8.9–10.3)
Chloride: 102 mmol/L (ref 98–111)
Creatinine, Ser: 1.29 mg/dL — ABNORMAL HIGH (ref 0.44–1.00)
GFR, Estimated: 51 mL/min — ABNORMAL LOW (ref >60)
Glucose, Bld: 114 mg/dL — ABNORMAL HIGH (ref 70–99)
Potassium: 3.5 mmol/L (ref 3.5–5.1)
Sodium: 137 mmol/L (ref 135–145)

## 2023-04-27 LAB — ABO/RH: ABO/RH(D): O POS

## 2023-04-27 LAB — RPR: RPR Ser Ql: NONREACTIVE

## 2023-04-27 SURGERY — HYSTERECTOMY, TOTAL, ABDOMINAL, WITH SALPINGECTOMY
Anesthesia: General | Site: Pelvis | Laterality: Bilateral

## 2023-04-27 MED ORDER — MIDAZOLAM HCL 5 MG/5ML IJ SOLN
INTRAMUSCULAR | Status: DC | PRN
  Administered 2023-04-27: 2 mg via INTRAVENOUS

## 2023-04-27 MED ORDER — CHLORHEXIDINE GLUCONATE 0.12 % MT SOLN
15.0000 mL | Freq: Once | OROMUCOSAL | Status: AC
  Administered 2023-04-27: 15 mL via OROMUCOSAL

## 2023-04-27 MED ORDER — HYDROMORPHONE HCL 1 MG/ML IJ SOLN
0.2000 mg | INTRAMUSCULAR | Status: DC | PRN
  Administered 2023-04-27 (×4): 0.6 mg via INTRAVENOUS
  Filled 2023-04-27 (×4): qty 1

## 2023-04-27 MED ORDER — PHENYLEPHRINE 80 MCG/ML (10ML) SYRINGE FOR IV PUSH (FOR BLOOD PRESSURE SUPPORT)
PREFILLED_SYRINGE | INTRAVENOUS | Status: AC
  Filled 2023-04-27: qty 10

## 2023-04-27 MED ORDER — PHENYLEPHRINE HCL (PRESSORS) 10 MG/ML IV SOLN
INTRAVENOUS | Status: AC
  Filled 2023-04-27: qty 1

## 2023-04-27 MED ORDER — DROPERIDOL 2.5 MG/ML IJ SOLN: 0.6250 mg | Freq: Once | INTRAMUSCULAR | Status: DC | PRN

## 2023-04-27 MED ORDER — FENTANYL CITRATE PF 50 MCG/ML IJ SOSY
PREFILLED_SYRINGE | INTRAMUSCULAR | Status: AC
  Administered 2023-04-27: 50 ug via INTRAVENOUS
  Filled 2023-04-27: qty 4

## 2023-04-27 MED ORDER — FENTANYL CITRATE (PF) 100 MCG/2ML IJ SOLN
INTRAMUSCULAR | Status: DC | PRN
  Administered 2023-04-27 (×2): 50 ug via INTRAVENOUS
  Administered 2023-04-27: 250 ug via INTRAVENOUS

## 2023-04-27 MED ORDER — BISOPROLOL FUMARATE 5 MG PO TABS
5.0000 mg | ORAL_TABLET | Freq: Every day | ORAL | Status: DC
  Administered 2023-04-27 – 2023-04-28 (×2): 5 mg via ORAL
  Filled 2023-04-27 (×2): qty 1

## 2023-04-27 MED ORDER — MIDAZOLAM HCL 2 MG/2ML IJ SOLN
INTRAMUSCULAR | Status: AC
  Filled 2023-04-27: qty 2

## 2023-04-27 MED ORDER — POVIDONE-IODINE 10 % EX SWAB: 2.0000 | Freq: Once | CUTANEOUS | Status: DC

## 2023-04-27 MED ORDER — ROCURONIUM BROMIDE 100 MG/10ML IV SOLN
INTRAVENOUS | Status: DC | PRN
  Administered 2023-04-27: 70 mg via INTRAVENOUS

## 2023-04-27 MED ORDER — PHENYLEPHRINE HCL-NACL 20-0.9 MG/250ML-% IV SOLN
INTRAVENOUS | Status: DC | PRN
Start: 2023-04-27 — End: 2023-04-27
  Administered 2023-04-27: 40 ug/min via INTRAVENOUS
  Administered 2023-04-27: 20 ug/min via INTRAVENOUS

## 2023-04-27 MED ORDER — SACUBITRIL-VALSARTAN 97-103 MG PO TABS
1.0000 | ORAL_TABLET | Freq: Two times a day (BID) | ORAL | Status: DC
  Administered 2023-04-27 – 2023-04-28 (×2): 1 via ORAL
  Filled 2023-04-27 (×2): qty 1

## 2023-04-27 MED ORDER — FENTANYL CITRATE (PF) 250 MCG/5ML IJ SOLN
INTRAMUSCULAR | Status: AC
  Filled 2023-04-27: qty 5

## 2023-04-27 MED ORDER — SIMETHICONE 80 MG PO CHEW
80.0000 mg | CHEWABLE_TABLET | Freq: Four times a day (QID) | ORAL | Status: DC | PRN
  Administered 2023-04-28 (×2): 80 mg via ORAL
  Filled 2023-04-27 (×2): qty 1

## 2023-04-27 MED ORDER — ISOSORBIDE MONONITRATE ER 30 MG PO TB24
30.0000 mg | ORAL_TABLET | Freq: Every day | ORAL | Status: DC
  Administered 2023-04-27 – 2023-04-28 (×2): 30 mg via ORAL
  Filled 2023-04-27 (×2): qty 1

## 2023-04-27 MED ORDER — HYDRALAZINE HCL 25 MG PO TABS
25.0000 mg | ORAL_TABLET | Freq: Three times a day (TID) | ORAL | Status: DC
  Administered 2023-04-27 – 2023-04-28 (×2): 25 mg via ORAL
  Filled 2023-04-27 (×3): qty 1

## 2023-04-27 MED ORDER — DOCUSATE SODIUM 100 MG PO CAPS
100.0000 mg | ORAL_CAPSULE | Freq: Two times a day (BID) | ORAL | Status: DC
  Administered 2023-04-27 – 2023-04-28 (×3): 100 mg via ORAL
  Filled 2023-04-27 (×3): qty 1

## 2023-04-27 MED ORDER — LACTATED RINGERS IV SOLN: INTRAVENOUS | Status: DC

## 2023-04-27 MED ORDER — SUGAMMADEX SODIUM 200 MG/2ML IV SOLN
INTRAVENOUS | Status: DC | PRN
  Administered 2023-04-27: 200 mg via INTRAVENOUS

## 2023-04-27 MED ORDER — PROPOFOL 10 MG/ML IV BOLUS
INTRAVENOUS | Status: AC
  Filled 2023-04-27: qty 20

## 2023-04-27 MED ORDER — ONDANSETRON HCL 4 MG/2ML IJ SOLN
INTRAMUSCULAR | Status: AC
  Filled 2023-04-27: qty 2

## 2023-04-27 MED ORDER — LIDOCAINE HCL (CARDIAC) PF 100 MG/5ML IV SOSY
PREFILLED_SYRINGE | INTRAVENOUS | Status: DC | PRN
  Administered 2023-04-27: 100 mg via INTRAVENOUS

## 2023-04-27 MED ORDER — BUPIVACAINE HCL (PF) 0.25 % IJ SOLN
INTRAMUSCULAR | Status: DC | PRN
  Administered 2023-04-27: 10 mL

## 2023-04-27 MED ORDER — LIDOCAINE HCL (PF) 2 % IJ SOLN
INTRAMUSCULAR | Status: AC
  Filled 2023-04-27: qty 5

## 2023-04-27 MED ORDER — DEXAMETHASONE SODIUM PHOSPHATE 10 MG/ML IJ SOLN
INTRAMUSCULAR | Status: AC
  Filled 2023-04-27: qty 1

## 2023-04-27 MED ORDER — ROCURONIUM BROMIDE 10 MG/ML (PF) SYRINGE
PREFILLED_SYRINGE | INTRAVENOUS | Status: AC
  Filled 2023-04-27: qty 10

## 2023-04-27 MED ORDER — PROPOFOL 10 MG/ML IV BOLUS
INTRAVENOUS | Status: DC | PRN
Start: 2023-04-27 — End: 2023-04-27
  Administered 2023-04-27: 20 mg via INTRAVENOUS
  Administered 2023-04-27: 80 mg via INTRAVENOUS

## 2023-04-27 MED ORDER — DEXAMETHASONE SODIUM PHOSPHATE 4 MG/ML IJ SOLN
INTRAMUSCULAR | Status: DC | PRN
  Administered 2023-04-27: 5 mg via INTRAVENOUS

## 2023-04-27 MED ORDER — 0.9 % SODIUM CHLORIDE (POUR BTL) OPTIME
TOPICAL | Status: DC | PRN
  Administered 2023-04-27: 2000 mL

## 2023-04-27 MED ORDER — MENTHOL 3 MG MT LOZG: 1.0000 | LOZENGE | OROMUCOSAL | Status: DC | PRN

## 2023-04-27 MED ORDER — BUPIVACAINE LIPOSOME 1.3 % IJ SUSP
INTRAMUSCULAR | Status: AC
  Filled 2023-04-27: qty 20

## 2023-04-27 MED ORDER — ACETAMINOPHEN 10 MG/ML IV SOLN
INTRAVENOUS | Status: DC | PRN
  Administered 2023-04-27: 1000 mg via INTRAVENOUS

## 2023-04-27 MED ORDER — FENTANYL CITRATE (PF) 100 MCG/2ML IJ SOLN
INTRAMUSCULAR | Status: AC
  Filled 2023-04-27: qty 2

## 2023-04-27 MED ORDER — SENNOSIDES-DOCUSATE SODIUM 8.6-50 MG PO TABS: 1.0000 | ORAL_TABLET | Freq: Every evening | ORAL | Status: DC | PRN

## 2023-04-27 MED ORDER — TORSEMIDE 20 MG PO TABS
20.0000 mg | ORAL_TABLET | Freq: Every day | ORAL | Status: DC
  Administered 2023-04-27 – 2023-04-28 (×2): 20 mg via ORAL
  Filled 2023-04-27 (×2): qty 1

## 2023-04-27 MED ORDER — PHENYLEPHRINE HCL (PRESSORS) 10 MG/ML IV SOLN
INTRAVENOUS | Status: DC | PRN
  Administered 2023-04-27: 80 ug via INTRAVENOUS
  Administered 2023-04-27: 160 ug via INTRAVENOUS
  Administered 2023-04-27: 80 ug via INTRAVENOUS

## 2023-04-27 MED ORDER — PANTOPRAZOLE SODIUM 40 MG PO TBEC
40.0000 mg | DELAYED_RELEASE_TABLET | Freq: Two times a day (BID) | ORAL | Status: DC
  Administered 2023-04-27 – 2023-04-28 (×2): 40 mg via ORAL
  Filled 2023-04-27 (×2): qty 1

## 2023-04-27 MED ORDER — ONDANSETRON HCL 4 MG/2ML IJ SOLN
INTRAMUSCULAR | Status: DC | PRN
  Administered 2023-04-27: 4 mg via INTRAVENOUS

## 2023-04-27 MED ORDER — SPIRONOLACTONE 25 MG PO TABS
25.0000 mg | ORAL_TABLET | Freq: Every day | ORAL | Status: DC
  Administered 2023-04-27: 25 mg via ORAL
  Filled 2023-04-27 (×2): qty 1

## 2023-04-27 MED ORDER — ORAL CARE MOUTH RINSE: 15.0000 mL | Freq: Once | OROMUCOSAL | Status: AC

## 2023-04-27 MED ORDER — FENTANYL CITRATE PF 50 MCG/ML IJ SOSY
25.0000 ug | PREFILLED_SYRINGE | INTRAMUSCULAR | Status: DC | PRN
  Administered 2023-04-27: 50 ug via INTRAVENOUS

## 2023-04-27 MED ORDER — BUPIVACAINE HCL 0.25 % IJ SOLN
INTRAMUSCULAR | Status: AC
  Filled 2023-04-27: qty 1

## 2023-04-27 MED ORDER — ACETAMINOPHEN 10 MG/ML IV SOLN
1000.0000 mg | Freq: Once | INTRAVENOUS | Status: DC | PRN
  Filled 2023-04-27: qty 100

## 2023-04-27 MED ORDER — HYDROMORPHONE HCL 2 MG PO TABS
2.0000 mg | ORAL_TABLET | ORAL | Status: DC | PRN
  Administered 2023-04-28 (×3): 2 mg via ORAL
  Filled 2023-04-27 (×3): qty 1

## 2023-04-27 SURGICAL SUPPLY — 42 items
ADH SKN CLS APL DERMABOND .7 (GAUZE/BANDAGES/DRESSINGS) ×1
APL SKNCLS STERI-STRIP NONHPOA (GAUZE/BANDAGES/DRESSINGS) ×1
BENZOIN TINCTURE PRP APPL 2/3 (GAUZE/BANDAGES/DRESSINGS) ×1 IMPLANT
BLADE EXTENDED COATED 6.5IN (ELECTRODE) IMPLANT
BRR ADH 6X5 SEPRAFILM 1 SHT (MISCELLANEOUS)
CLSR STERI-STRIP ANTIMIC 1/2X4 (GAUZE/BANDAGES/DRESSINGS) ×1 IMPLANT
DERMABOND ADVANCED .7 DNX12 (GAUZE/BANDAGES/DRESSINGS) IMPLANT
DRAPE WARM FLUID 44X44 (DRAPES) ×1 IMPLANT
DRSG OPSITE POSTOP 4X10 (GAUZE/BANDAGES/DRESSINGS) ×1 IMPLANT
DURAPREP 26ML APPLICATOR (WOUND CARE) ×1 IMPLANT
GLOVE BIO SURGEON STRL SZ 6.5 (GLOVE) ×1 IMPLANT
GLOVE BIOGEL PI IND STRL 7.0 (GLOVE) ×3 IMPLANT
GOWN STRL REUS W/TWL LRG LVL3 (GOWN DISPOSABLE) ×2 IMPLANT
HOLDER FOLEY CATH W/STRAP (MISCELLANEOUS) ×1 IMPLANT
KIT TURNOVER CYSTO (KITS) ×1 IMPLANT
MANIFOLD NEPTUNE II (INSTRUMENTS) ×1 IMPLANT
NDL HYPO 22X1.5 SAFETY MO (MISCELLANEOUS) ×1 IMPLANT
NEEDLE HYPO 22X1.5 SAFETY MO (MISCELLANEOUS) ×1 IMPLANT
NS IRRIG 500ML POUR BTL (IV SOLUTION) ×1 IMPLANT
PACK ABDOMINAL GYN (CUSTOM PROCEDURE TRAY) ×1 IMPLANT
PAD OB MATERNITY 4.3X12.25 (PERSONAL CARE ITEMS) ×1 IMPLANT
SEPRAFILM MEMBRANE 5X6 (MISCELLANEOUS) IMPLANT
SLEEVE SCD COMPRESS KNEE MED (STOCKING) ×1 IMPLANT
SPIKE FLUID TRANSFER (MISCELLANEOUS) IMPLANT
SPONGE T-LAP 18X18 ~~LOC~~+RFID (SPONGE) ×2 IMPLANT
SPONGE T-LAP 4X18 ~~LOC~~+RFID (SPONGE) IMPLANT
STRIP CLOSURE SKIN 1/2X4 (GAUZE/BANDAGES/DRESSINGS) IMPLANT
SUT PDS AB 0 CT1 36 (SUTURE) IMPLANT
SUT PDS AB 0 CTX 60 (SUTURE) IMPLANT
SUT PLAIN 2 0 XLH (SUTURE) ×1 IMPLANT
SUT VIC AB 0 CT1 18XCR BRD8 (SUTURE) ×2 IMPLANT
SUT VIC AB 0 CT1 27 (SUTURE) ×6
SUT VIC AB 0 CT1 27XBRD ANBCTR (SUTURE) ×4 IMPLANT
SUT VIC AB 0 CT1 8-18 (SUTURE) ×2
SUT VIC AB 2-0 CT1 27 (SUTURE)
SUT VIC AB 2-0 CT1 TAPERPNT 27 (SUTURE) IMPLANT
SUT VIC AB 4-0 KS 27 (SUTURE) ×1 IMPLANT
SUT VICRYL 0 TIES 12 18 (SUTURE) ×1 IMPLANT
SYR BULB IRRIG 60ML STRL (SYRINGE) IMPLANT
SYR CONTROL 10ML LL (SYRINGE) ×1 IMPLANT
TOWEL OR 17X24 6PK STRL BLUE (TOWEL DISPOSABLE) ×2 IMPLANT
TRAY FOLEY W/BAG SLVR 14FR LF (SET/KITS/TRAYS/PACK) ×1 IMPLANT

## 2023-04-27 NOTE — Anesthesia Procedure Notes (Signed)
Procedure Name: Intubation Date/Time: 04/27/2023 7:50 AM  Performed by: Jessica Priest, CRNAPre-anesthesia Checklist: Patient identified, Emergency Drugs available, Suction available, Patient being monitored and Timeout performed Patient Re-evaluated:Patient Re-evaluated prior to induction Oxygen Delivery Method: Circle system utilized Preoxygenation: Pre-oxygenation with 100% oxygen Induction Type: IV induction Ventilation: Mask ventilation without difficulty Laryngoscope Size: Mac and 3 Grade View: Grade II Tube type: Oral Tube size: 7.0 mm Number of attempts: 1 Airway Equipment and Method: Stylet and Oral airway Placement Confirmation: ETT inserted through vocal cords under direct vision, positive ETCO2, breath sounds checked- equal and bilateral and CO2 detector Secured at: 23 cm Tube secured with: Tape Dental Injury: Teeth and Oropharynx as per pre-operative assessment

## 2023-04-27 NOTE — Progress Notes (Signed)
POC HCG urine test negative

## 2023-04-27 NOTE — Progress Notes (Signed)
Patient is resting. Sleeping. BP 119/80 (BP Location: Left Arm)   Pulse 87   Temp 97.8 F (36.6 C) (Oral)   Resp 17   Ht 5' 1.25" (1.556 m)   Wt 99.3 kg   LMP 04/14/2023 (Exact Date)   SpO2 92%   BMI 41.04 kg/m  Urine output is good Abdomen bandage is dry POD # 0  Doing well Remove foley at 8 pm

## 2023-04-27 NOTE — Brief Op Note (Signed)
04/27/2023  9:20 AM  PATIENT:  [name]  47 y.o. female  PRE-OPERATIVE DIAGNOSIS:  Fibroids Menorrhagia  POST-OPERATIVE DIAGNOSIS: Same  PROCEDURE:  Procedure(s): HYSTERECTOMY ABDOMINAL WITH SALPINGECTOMY (Bilateral)  SURGEON:  Surgeons and Role:    * Marcelle Overlie, MD - Primary    * Tawni Levy, MD - Assisting  PHYSICIAN ASSISTANT:   ASSISTANTS: none   ANESTHESIA:   local and general  EBL:  75 mL   BLOOD ADMINISTERED:none  DRAINS: Urinary Catheter (Foley)   LOCAL MEDICATIONS USED:  LIDOCAINE   SPECIMEN:  Source of Specimen:  uterus and cervix  DISPOSITION OF SPECIMEN:  PATHOLOGY  COUNTS:  YES  TOURNIQUET:  * No tourniquets in log *  DICTATION: .Other Dictation: Dictation Number dictated  PLAN OF CARE: Admit for overnight observation  PATIENT DISPOSITION:  PACU - hemodynamically stable.   Delay start of Pharmacological VTE agent (>24hrs) due to surgical blood loss or risk of bleeding: not applicable

## 2023-04-27 NOTE — H&P (Signed)
47 year old female admitted for surgical management of fibroids.  She is having pain and heavy bleeding.  Past Medical History:  Diagnosis Date   AICD (automatic cardioverter/defibrillator) present    Anemia    2024 iron infusions x 2, Follows w/ Dr. Leatha Gilding, hematology/oncology.   Asthma    asymptomatic   Atrial fibrillation (HCC)    hx of   CHF (congestive heart failure) (HCC)    Follows w/ Dr. Derenda Fennel, @ HF clinic. 01/29/23 Echo EF 45 - 50%   Chronic bronchitis (HCC)    2024 - Patient states she has not had bronchitis in many years.   Chronic systolic CHF (congestive heart failure), NYHA class 2 (HCC) 01/2006   Has MDT Defibrillator, EF had improved by 2017 echo   Gestational diabetes 2013   History of hiatal hernia    repaired 11/24/16   Hypertension    Follows w/ cardiologist.   Migraines    Has not had a migraine in many years per pt on 04/14/23.   Other primary cardiomyopathies    Paroxysmal ventricular tachycardia (HCC)    Pneumonia 09/04/2016   Community-acquired   Sleep apnea    Follows w/ Dr. Armanda Magic, cardiology. No cpap at the present (03/2023). Patient states she is waiting to be scheduled for a sleep study.   Varicose veins 1998   Developed during last pregnancy   Past Surgical History:  Procedure Laterality Date   CARDIAC CATHETERIZATION     EF 30-35%   CARDIAC CATHETERIZATION N/A 05/04/2015   Procedure: Left Heart Cath and Coronary Angiography;  Surgeon: Lyn Records, MD;  Location: [facility] INVASIVE CV LAB;  Service: Cardiovascular;  Laterality: N/A;   CARDIAC DEFIBRILLATOR PLACEMENT  2007   CESAREAN SECTION     1998 & 2014   IMPLANTABLE CARDIOVERTER DEFIBRILLATOR (ICD) GENERATOR CHANGE N/A 09/10/2014   Procedure: ICD GENERATOR CHANGE;  Surgeon: Marinus Maw, MD;  Location: [facility] CATH LAB;  Service: Cardiovascular;  Laterality: N/A;   INSERT / REPLACE / REMOVE PACEMAKER  02/10/2006   Status post implantable cardioverter-defibrillator insertion   .Marland KitchenMarland KitchenMarland KitchenThe Medtronic Maximo VR, model 7232, single chamber   LAPAROSCOPIC GASTRIC SLEEVE RESECTION WITH HIATAL HERNIA REPAIR N/A 11/24/2016   Procedure: LAPAROSCOPIC GASTRIC SLEEVE RESECTION WITH HIATAL HERNIA REPAIR, UPPER ENDO;  Surgeon: Gaynelle Adu, MD;  Location: [facility];  Service: General;  Laterality: N/A;   LAPAROSCOPIC OVARIAN CYSTECTOMY Bilateral 11/24/2016   Procedure: LAPAROSCOPIC BILATERAL OVARIAN CYSTECTOMY;  Surgeon: Myna Hidalgo, DO;  Location: [facility];  Service: Gynecology;  Laterality: Bilateral;   REDUCTION MAMMAPLASTY Bilateral 1995   Scheduled Meds:  povidone-iodine  2 Application Topical Once   Continuous Infusions:  clindamycin (CLEOCIN) IV     And   gentamicin     lactated ringers 50 mL/hr at 04/27/23 0655   lactated ringers     lactated ringers     PRN Meds:. Prior to Admission medications   Medication Sig Start Date End Date Taking? Authorizing Provider  bisoprolol (ZEBETA) 5 MG tablet Take 1 tablet (5 mg total) by mouth at bedtime. Patient taking differently: Take 2.5 mg by mouth at bedtime. 01/29/23  Yes [place], Anderson Malta, FNP  Calcium-Vitamin D-Vitamin K (VIACTIV CALCIUM PLUS D) 650-12.5-40 MG-MCG-MCG CHEW Chew 1 each by mouth in the morning and at bedtime.   Yes [provider]  cetirizine (ZYRTEC) 10 MG tablet Take 10 mg by mouth at bedtime as needed for allergies.   Yes [provider]  hydrALAZINE (APRESOLINE) 25  MG tablet Take 1 tablet (25 mg total) by mouth 3 (three) times daily. Patient taking differently: Take 25 mg by mouth 2 (two) times daily. 06/29/22  Yes Bensimhon, Bevelyn Buckles, MD  Iron-FA-B Cmp-C-Biot-Probiotic (FUSION PLUS) CAPS Take 1 capsule by mouth daily. 07/17/22  Yes Renaye Rakers, MD  isosorbide mononitrate (IMDUR) 30 MG 24 hr tablet Take 1 tablet (30 mg total) by mouth daily. Patient taking differently: Take 30 mg by mouth at bedtime. 01/29/23  Yes [place], Anderson Malta, FNP  Multiple Vitamin (MULTIVITAMIN WITH MINERALS) TABS tablet  Take 2 tablets by mouth daily. Chewable   Yes [provider]  pantoprazole (PROTONIX) 40 MG tablet Take 1 tablet (40 mg total) by mouth 2 (two) times daily before meals 06/26/22  Yes   potassium chloride SA (KLOR-CON M20) 20 MEQ tablet Take 1.5 tablets (30 mEq total) by mouth 2 (two) times daily. 02/08/23  Yes [place], Anderson Malta, FNP  progesterone (PROMETRIUM) 200 MG capsule Take 1 capsule (200 mg total) by mouth daily. Patient taking differently: Take 200 mg by mouth at bedtime. 01/05/23  Yes   sacubitril-valsartan (ENTRESTO) 97-103 MG Take 1 tablet by mouth 2 (two) times daily. 09/15/22  Yes Bensimhon, Bevelyn Buckles, MD  spironolactone (ALDACTONE) 25 MG tablet Take 1 tablet (25 mg total) by mouth daily. 02/16/23  Yes Laurey Morale, MD  torsemide (DEMADEX) 20 MG tablet Take 1 tablet (20 mg total) by mouth daily. 04/01/23  Yes Laurey Morale, MD   BP 108/70   Pulse 74   Temp 98.8 F (37.1 C) (Oral)   Resp 18   Ht 5' 1.25" (1.556 m)   Wt 99.3 kg   LMP 04/14/2023 (Exact Date)   SpO2 98%   BMI 41.04 kg/m  General alert and oriented Lung CTAB  Car RRR  Abdomen is soft and non tender Pelvic enlarged and myomatous uterus   Results for orders placed or performed during the hospital encounter of 04/27/23 (from the past 24 hour(s))  CBC     Status: None   Collection Time: 04/27/23  6:48 AM  Result Value Ref Range   WBC 7.8 4.0 - 10.5 K/uL   RBC 4.45 3.87 - 5.11 MIL/uL   Hemoglobin 12.1 12.0 - 15.0 g/dL   HCT 18.8 41.6 - 60.6 %   MCV 89.9 80.0 - 100.0 fL   MCH 27.2 26.0 - 34.0 pg   MCHC 30.3 30.0 - 36.0 g/dL   RDW 30.1 60.1 - 09.3 %   Platelets 251 150 - 400 K/uL   nRBC 0.0 0.0 - 0.2 %   IMPRESSION: Fibroids  PLAN: TAH and BS Risks reviewed Consent signed

## 2023-04-27 NOTE — Anesthesia Postprocedure Evaluation (Signed)
Anesthesia Post Note  Patient: [name]  Procedure(s) Performed: HYSTERECTOMY ABDOMINAL WITH SALPINGECTOMY (Bilateral: Pelvis)     Patient location during evaluation: PACU Anesthesia Type: General Level of consciousness: awake and alert Pain management: pain level controlled Vital Signs Assessment: post-procedure vital signs reviewed and stable Respiratory status: spontaneous breathing, nonlabored ventilation, respiratory function stable and patient connected to nasal cannula oxygen Cardiovascular status: blood pressure returned to baseline and stable Postop Assessment: no apparent nausea or vomiting Anesthetic complications: no   No notable events documented.  Last Vitals:  Vitals:   04/27/23 1207 04/27/23 1302  BP: 136/81 119/80  Pulse: 90 87  Resp: 17 17  Temp: 36.8 C 36.6 C  SpO2: 97% 92%    Last Pain:  Vitals:   04/27/23 1411  TempSrc:   PainSc: 8                  [place]

## 2023-04-27 NOTE — Transfer of Care (Signed)
Immediate Anesthesia Transfer of Care Note  Patient: [name]  Procedure(s) Performed: Procedure(s) (LRB): HYSTERECTOMY ABDOMINAL WITH SALPINGECTOMY (Bilateral)  Patient Location: PACU  Anesthesia Type: GA  Level of Consciousness: awake, sedated, patient cooperative and responds to stimulation  Airway & Oxygen Therapy: Patient Spontanous Breathing and Patient connected to [place] oxygen  Post-op Assessment: Report given to PACU RN, Post -op Vital signs reviewed and stable and Patient moving all extremities  Post vital signs: Reviewed and stable  Complications: No apparent anesthesia complications

## 2023-04-28 ENCOUNTER — Encounter (HOSPITAL_COMMUNITY): Payer: Self-pay | Admitting: Obstetrics and Gynecology

## 2023-04-28 ENCOUNTER — Other Ambulatory Visit (HOSPITAL_COMMUNITY): Payer: Self-pay

## 2023-04-28 DIAGNOSIS — N920 Excessive and frequent menstruation with regular cycle: Secondary | ICD-10-CM | POA: Diagnosis not present

## 2023-04-28 LAB — CBC
HCT: 35 % — ABNORMAL LOW (ref 36.0–46.0)
Hemoglobin: 11.1 g/dL — ABNORMAL LOW (ref 12.0–15.0)
MCH: 28.5 pg (ref 26.0–34.0)
MCHC: 31.7 g/dL (ref 30.0–36.0)
MCV: 90 fL (ref 80.0–100.0)
Platelets: 235 10*3/uL (ref 150–400)
RBC: 3.89 MIL/uL (ref 3.87–5.11)
RDW: 14.9 % (ref 11.5–15.5)
WBC: 9.4 10*3/uL (ref 4.0–10.5)
nRBC: 0 % (ref 0.0–0.2)

## 2023-04-28 LAB — BASIC METABOLIC PANEL
Anion gap: 9 (ref 5–15)
BUN: 18 mg/dL (ref 6–20)
CO2: 26 mmol/L (ref 22–32)
Calcium: 8.1 mg/dL — ABNORMAL LOW (ref 8.9–10.3)
Chloride: 104 mmol/L (ref 98–111)
Creatinine, Ser: 1.19 mg/dL — ABNORMAL HIGH (ref 0.44–1.00)
GFR, Estimated: 56 mL/min — ABNORMAL LOW (ref >60)
Glucose, Bld: 123 mg/dL — ABNORMAL HIGH (ref 70–99)
Potassium: 4.1 mmol/L (ref 3.5–5.1)
Sodium: 139 mmol/L (ref 135–145)

## 2023-04-28 LAB — SURGICAL PATHOLOGY

## 2023-04-28 MED ORDER — HYDROMORPHONE HCL 2 MG PO TABS
2.0000 mg | ORAL_TABLET | ORAL | 0 refills | Status: DC | PRN
Start: 2023-04-28 — End: 2023-10-13
  Filled 2023-04-28: qty 30, 5d supply, fill #0

## 2023-04-28 NOTE — Progress Notes (Signed)
   04/28/23 1016  TOC Brief Assessment  Insurance and Status Reviewed  Patient has primary care physician Yes  Home environment has been reviewed Resides with relatives  Prior level of function: Independent at baseline  Prior/Current Home Services No current home services  Social Determinants of Health Reivew SDOH reviewed no interventions necessary  Readmission risk has been reviewed Yes  Transition of care needs no transition of care needs at this time

## 2023-04-28 NOTE — Progress Notes (Signed)
Reviewed written discharge instructions with patient. All questions answered. Patient verbalized understanding. Discharged via wheelchair with all belongings... in stable condition. 

## 2023-04-28 NOTE — Discharge Summary (Signed)
Admission Diagnosis: Symptomatic Fibroids  Discharge Diagnosis: Same  Hospital Course: 47 year old female admitted for TAH/BS. Underwent uncomplicated TAH/BS. By POD # 1 she was voiding, tolerating regular breakfast, had good pain control and had stable vital signs.  General alert and oriented Lung CTAB  Car RRR  Abdomen is soft and non tender  Results for orders placed or performed during the hospital encounter of 04/27/23 (from the past 24 hour(s))  CBC     Status: Abnormal   Collection Time: 04/28/23  4:12 AM  Result Value Ref Range   WBC 9.4 4.0 - 10.5 K/uL   RBC 3.89 3.87 - 5.11 MIL/uL   Hemoglobin 11.1 (L) 12.0 - 15.0 g/dL   HCT 47.8 (L) 29.5 - 62.1 %   MCV 90.0 80.0 - 100.0 fL   MCH 28.5 26.0 - 34.0 pg   MCHC 31.7 30.0 - 36.0 g/dL   RDW 30.8 65.7 - 84.6 %   Platelets 235 150 - 400 K/uL   nRBC 0.0 0.0 - 0.2 %  Basic metabolic panel     Status: Abnormal   Collection Time: 04/28/23  4:12 AM  Result Value Ref Range   Sodium 139 135 - 145 mmol/L   Potassium 4.1 3.5 - 5.1 mmol/L   Chloride 104 98 - 111 mmol/L   CO2 26 22 - 32 mmol/L   Glucose, Bld 123 (H) 70 - 99 mg/dL   BUN 18 6 - 20 mg/dL   Creatinine, Ser 9.62 (H) 0.44 - 1.00 mg/dL   Calcium 8.1 (L) 8.9 - 10.3 mg/dL   GFR, Estimated 56 (L) >60 mL/min   Anion gap 9 5 - 15   BP 132/78 (BP Location: Left Arm)   Pulse 74   Temp 99.2 F (37.3 C) (Oral)   Resp 18   Ht 5' 1.25" (1.556 m)   Wt 99.3 kg   LMP 04/14/2023 (Exact Date)   SpO2 97%   BMI 41.04 kg/m  Patient discharged home in good condition on POD # 1  She was given wound precautions Rx Dilaudid for pain Follow up next Tuesday in OV with Dr Vincente Poli

## 2023-04-28 NOTE — Op Note (Signed)
NAME: [name], [name] MEDICAL RECORD NO: [id number] ACCOUNT NO: [id number] DATE OF BIRTH: 1975/10/12 FACILITY: [facility] LOCATION: [id number] PHYSICIAN: Nicoles Sedlacek L. Vincente Poli, MD  Operative Report   DATE OF PROCEDURE: 04/27/2023  PREOPERATIVE DIAGNOSIS:  Fibroids and menorrhagia.  POSTOPERATIVE DIAGNOSIS:  Fibroids and menorrhagia.  PROCEDURE:  Total abdominal hysterectomy with salpingectomy.  SURGEON:  Dr. Vincente Poli.  ASSISTANT:  Dr. Jule Economy.  ANESTHESIA:  Local and general.  ESTIMATED BLOOD LOSS:  75 mL.  COMPLICATIONS:  None.  DRAINS:  Foley catheter.    SPECIMENS: Uterus, cervix and portion of fallopian tubes.  DESCRIPTION OF PROCEDURE:  The patient was taken to the operating room.  She was intubated.  She was prepped and draped in the usual sterile fashion.  A Foley catheter had been inserted and draining clear urine.  Timeout was performed.  A low transverse  incision was used and carried down to the fascia.  Fascia was scored in the midline and carried down to the underlying muscle.  The peritoneum was entered bluntly.  The peritoneal incision was then stretched.  We then examined under anesthesia revealed  that she had an enlarged myomatous uterus.  Her ovaries appeared normal.  Fallopian tubes appeared to be removed completely from her sterilization.  We then packed the large and small bowel in the upper abdomen and proceeded with a hysterectomy in the  following fashion.  We grasped the uterus and elevated it through the incision.  We then placed curved Heaney clamps across the triple pedicle on the right and in the left.  Each pedicle was then clamped, cut and suture ligated using 0 Vicryl suture and  then a second free tie suture ligature was used for further hemostasis control.  We then developed the bladder flap.  There was some adhesions from her previous cesarean sections.  We then placed curved Heaney clamps at the level of the internal os  across the uterine artery each pedicle  was clamped, cut and suture ligated using 0 Vicryl suture.  We then further developed the bladder flap and freeing the bladder from the anterior surface of the cervix and using a series of straight Heaney clamps,  staying just snug beside the cervix, clamping the uterosacral cardinal ligament complexes on each side.  Each pedicle was clamped, cut and suture ligated using 0 Vicryl suture.  We then placed curved Heaney clamps just beneath the external os and removed  the specimen.  Careful attention to avoid the bladder, which was far away from our clamps.  We then closed the cuff completely using 0 Vicryl suture.  We then irrigated the pelvis.  All pedicles were noted to be very hemostatic.  The lap, sponges and  all instruments were removed from the abdominal cavity.  The peritoneum was closed using 0 Vicryl.  The fascia was closed using 0 Vicryl in a running stitch starting at each corner meeting in the midline.  The subcutaneous was closed with plain gut  interrupteds and the skin was closed with a 3-0 Vicryl on a Keith needle.  Benzoin, Steri-Strips and honeycomb dressing were applied.  All sponge, lap and instrument counts were correct x2.  The patient was extubated and went to recovery room in stable  condition.     [email or web address] D: 04/28/2023 8:24:24 am T: 04/28/2023 8:59:00 am  JOB: [id number]/ [id number]

## 2023-04-28 NOTE — Plan of Care (Signed)
Problem: Education: Goal: Knowledge of General Education information will improve Description: Including pain rating scale, medication(s)/side effects and non-pharmacologic comfort measures Outcome: Adequate for Discharge   Problem: Health Behavior/Discharge Planning: Goal: Ability to manage health-related needs will improve Outcome: Adequate for Discharge   Problem: Clinical Measurements: Goal: Ability to maintain clinical measurements within normal limits will improve Outcome: Adequate for Discharge Goal: Will remain free from infection Outcome: Adequate for Discharge Goal: Diagnostic test results will improve Outcome: Adequate for Discharge Goal: Respiratory complications will improve Outcome: Adequate for Discharge Goal: Cardiovascular complication will be avoided Outcome: Adequate for Discharge   Problem: Activity: Goal: Risk for activity intolerance will decrease Outcome: Adequate for Discharge   Problem: Nutrition: Goal: Adequate nutrition will be maintained Outcome: Adequate for Discharge   Problem: Coping: Goal: Level of anxiety will decrease Outcome: Adequate for Discharge   Problem: Elimination: Goal: Will not experience complications related to bowel motility Outcome: Adequate for Discharge Goal: Will not experience complications related to urinary retention Outcome: Adequate for Discharge   Problem: Pain Managment: Goal: General experience of comfort will improve Outcome: Adequate for Discharge   Problem: Safety: Goal: Ability to remain free from injury will improve Outcome: Adequate for Discharge   Problem: Skin Integrity: Goal: Risk for impaired skin integrity will decrease Outcome: Adequate for Discharge   Problem: Education: Goal: Knowledge of the prescribed therapeutic regimen will improve Outcome: Adequate for Discharge Goal: Understanding of sexual limitations or changes related to disease process or condition will improve Outcome: Adequate  for Discharge Goal: Individualized Educational Video(s) Outcome: Adequate for Discharge   Problem: Self-Concept: Goal: Communication of feelings regarding changes in body function or appearance will improve Outcome: Adequate for Discharge   Problem: Skin Integrity: Goal: Demonstration of wound healing without infection will improve Outcome: Adequate for Discharge

## 2023-04-29 NOTE — Progress Notes (Signed)
Remote ICD transmission.   

## 2023-04-30 ENCOUNTER — Other Ambulatory Visit: Payer: Self-pay

## 2023-04-30 ENCOUNTER — Inpatient Hospital Stay: Payer: BC Managed Care – PPO

## 2023-04-30 ENCOUNTER — Inpatient Hospital Stay: Payer: BC Managed Care – PPO | Admitting: Oncology

## 2023-04-30 DIAGNOSIS — D472 Monoclonal gammopathy: Secondary | ICD-10-CM

## 2023-04-30 DIAGNOSIS — D509 Iron deficiency anemia, unspecified: Secondary | ICD-10-CM

## 2023-04-30 DIAGNOSIS — D500 Iron deficiency anemia secondary to blood loss (chronic): Secondary | ICD-10-CM

## 2023-05-03 ENCOUNTER — Inpatient Hospital Stay: Payer: BC Managed Care – PPO | Attending: Hematology and Oncology

## 2023-05-03 ENCOUNTER — Inpatient Hospital Stay: Payer: BC Managed Care – PPO | Admitting: Nurse Practitioner

## 2023-05-03 ENCOUNTER — Other Ambulatory Visit: Payer: Self-pay | Admitting: Nurse Practitioner

## 2023-05-03 DIAGNOSIS — Z9884 Bariatric surgery status: Secondary | ICD-10-CM

## 2023-05-03 DIAGNOSIS — D500 Iron deficiency anemia secondary to blood loss (chronic): Secondary | ICD-10-CM

## 2023-05-03 DIAGNOSIS — D472 Monoclonal gammopathy: Secondary | ICD-10-CM

## 2023-05-03 NOTE — Progress Notes (Deleted)
Patient Care Team: Renaye Rakers, MD as PCP - General (Family Medicine) Bensimhon, Bevelyn Buckles, MD as PCP - Cardiology (Cardiology) Quintella Reichert, MD as PCP - Sleep Medicine (Cardiology) Malachy Mood, MD as Consulting Physician (Hematology and Oncology)   CHIEF COMPLAINT: Follow-up IDA and MGUS  Oncology History   No history exists.     CURRENT THERAPY: Oral and IV iron last given 10/16/2022  INTERVAL HISTORY Ms Minardi presents as a transfer from Dr. Angelene Giovanni, last seen by him 01/29/2023.  She recently underwent a hysterectomy by Dr. Vincente Poli 04/28/2023.   ROS   Past Medical History:  Diagnosis Date   AICD (automatic cardioverter/defibrillator) present    Anemia    2024 iron infusions x 2, Follows w/ Dr. Leatha Gilding, hematology/oncology.   Asthma    asymptomatic   Atrial fibrillation (HCC)    hx of   CHF (congestive heart failure) (HCC)    Follows w/ Dr. Derenda Fennel, @ HF clinic. 01/29/23 Echo EF 45 - 50%   Chronic bronchitis (HCC)    2024 - Patient states she has not had bronchitis in many years.   Chronic systolic CHF (congestive heart failure), NYHA class 2 (HCC) 01/2006   Has MDT Defibrillator, EF had improved by 2017 echo   Gestational diabetes 2013   History of hiatal hernia    repaired 11/24/16   Hypertension    Follows w/ cardiologist.   Migraines    Has not had a migraine in many years per pt on 04/14/23.   Other primary cardiomyopathies    Paroxysmal ventricular tachycardia (HCC)    Pneumonia 09/04/2016   Community-acquired   Sleep apnea    Follows w/ Dr. Armanda Magic, cardiology. No cpap at the present (03/2023). Patient states she is waiting to be scheduled for a sleep study.   Varicose veins 1998   Developed during last pregnancy     Past Surgical History:  Procedure Laterality Date   CARDIAC CATHETERIZATION     EF 30-35%   CARDIAC CATHETERIZATION N/A 05/04/2015   Procedure: Left Heart Cath and Coronary Angiography;  Surgeon: Lyn Records, MD;  Location:  [facility] INVASIVE CV LAB;  Service: Cardiovascular;  Laterality: N/A;   CARDIAC DEFIBRILLATOR PLACEMENT  2007   CESAREAN SECTION     1998 & 2014   HYSTERECTOMY ABDOMINAL WITH SALPINGECTOMY Bilateral 04/27/2023   Procedure: HYSTERECTOMY ABDOMINAL WITH SALPINGECTOMY;  Surgeon: Marcelle Overlie, MD;  Location: [facility];  Service: Gynecology;  Laterality: Bilateral;   IMPLANTABLE CARDIOVERTER DEFIBRILLATOR (ICD) GENERATOR CHANGE N/A 09/10/2014   Procedure: ICD GENERATOR CHANGE;  Surgeon: Marinus Maw, MD;  Location: [facility] CATH LAB;  Service: Cardiovascular;  Laterality: N/A;   INSERT / REPLACE / REMOVE PACEMAKER  02/10/2006   Status post implantable cardioverter-defibrillator insertion  .Marland KitchenMarland KitchenMarland KitchenThe Medtronic Maximo VR, model 7232, single chamber   LAPAROSCOPIC GASTRIC SLEEVE RESECTION WITH HIATAL HERNIA REPAIR N/A 11/24/2016   Procedure: LAPAROSCOPIC GASTRIC SLEEVE RESECTION WITH HIATAL HERNIA REPAIR, UPPER ENDO;  Surgeon: Gaynelle Adu, MD;  Location: [facility];  Service: General;  Laterality: N/A;   LAPAROSCOPIC OVARIAN CYSTECTOMY Bilateral 11/24/2016   Procedure: LAPAROSCOPIC BILATERAL OVARIAN CYSTECTOMY;  Surgeon: Myna Hidalgo, DO;  Location: [facility];  Service: Gynecology;  Laterality: Bilateral;   REDUCTION MAMMAPLASTY Bilateral 1995     Outpatient Encounter Medications as of 05/03/2023  Medication Sig   bisoprolol (ZEBETA) 5 MG tablet Take 1 tablet (5 mg total) by mouth at bedtime. (Patient taking differently: Take 2.5 mg by mouth at bedtime.)  Calcium-Vitamin D-Vitamin K (VIACTIV CALCIUM PLUS D) 650-12.5-40 MG-MCG-MCG CHEW Chew 1 each by mouth in the morning and at bedtime.   cetirizine (ZYRTEC) 10 MG tablet Take 10 mg by mouth at bedtime as needed for allergies.   hydrALAZINE (APRESOLINE) 25 MG tablet Take 1 tablet (25 mg total) by mouth 3 (three) times daily. (Patient taking differently: Take 25 mg by mouth 2 (two) times daily.)   HYDROmorphone (DILAUDID) 2 MG tablet Take 1 tablet (2 mg total) by mouth  every 3 (three) hours as needed for severe pain ((when tolerating fluids)).   Iron-FA-B Cmp-C-Biot-Probiotic (FUSION PLUS) CAPS Take 1 capsule by mouth daily.   isosorbide mononitrate (IMDUR) 30 MG 24 hr tablet Take 1 tablet (30 mg total) by mouth daily. (Patient taking differently: Take 30 mg by mouth at bedtime.)   Multiple Vitamin (MULTIVITAMIN WITH MINERALS) TABS tablet Take 2 tablets by mouth daily. Chewable   pantoprazole (PROTONIX) 40 MG tablet Take 1 tablet (40 mg total) by mouth 2 (two) times daily before meals   potassium chloride SA (KLOR-CON M20) 20 MEQ tablet Take 1.5 tablets (30 mEq total) by mouth 2 (two) times daily.   progesterone (PROMETRIUM) 200 MG capsule Take 1 capsule (200 mg total) by mouth daily. (Patient taking differently: Take 200 mg by mouth at bedtime.)   sacubitril-valsartan (ENTRESTO) 97-103 MG Take 1 tablet by mouth 2 (two) times daily.   spironolactone (ALDACTONE) 25 MG tablet Take 1 tablet (25 mg total) by mouth daily.   torsemide (DEMADEX) 20 MG tablet Take 1 tablet (20 mg total) by mouth daily.   No facility-administered encounter medications on file as of 05/03/2023.     There were no vitals filed for this visit. There is no height or weight on file to calculate BMI.   PHYSICAL EXAM GENERAL:alert, no distress and comfortable SKIN: no rash  EYES: sclera clear NECK: without mass LYMPH:  no palpable cervical or supraclavicular lymphadenopathy  LUNGS: clear with normal breathing effort HEART: regular rate & rhythm, no lower extremity edema ABDOMEN: abdomen soft, non-tender and normal bowel sounds NEURO: alert & oriented x 3 with fluent speech, no focal motor/sensory deficits Breast exam:  PAC without erythema    CBC    Component Value Date/Time   WBC 9.4 04/28/2023 0412   RBC 3.89 04/28/2023 0412   HGB 11.1 (L) 04/28/2023 0412   HGB 12.2 11/26/2022 1458   HCT 35.0 (L) 04/28/2023 0412   PLT 235 04/28/2023 0412   PLT 250 11/26/2022 1458   MCV 90.0  04/28/2023 0412   MCH 28.5 04/28/2023 0412   MCHC 31.7 04/28/2023 0412   RDW 14.9 04/28/2023 0412   LYMPHSABS 2.9 01/29/2023 0842   MONOABS 0.5 01/29/2023 0842   EOSABS 0.2 01/29/2023 0842   BASOSABS 0.0 01/29/2023 0842     CMP     Component Value Date/Time   NA 139 04/28/2023 0412   K 4.1 04/28/2023 0412   CL 104 04/28/2023 0412   CO2 26 04/28/2023 0412   GLUCOSE 123 (H) 04/28/2023 0412   BUN 18 04/28/2023 0412   CREATININE 1.19 (H) 04/28/2023 0412   CREATININE 1.10 (H) 11/26/2022 1449   CALCIUM 8.1 (L) 04/28/2023 0412   PROT 6.9 11/26/2022 1449   ALBUMIN 3.7 11/26/2022 1449   AST 11 (L) 11/26/2022 1449   ALT 10 11/26/2022 1449   ALKPHOS 55 11/26/2022 1449   BILITOT 0.3 11/26/2022 1449   GFRNONAA 56 (L) 04/28/2023 0412   GFRNONAA >60 11/26/2022 1449  GFRAA >60 11/27/2019 1003     ASSESSMENT & PLAN:  PLAN:  No orders of the defined types were placed in this encounter.     All questions were answered. The patient knows to call the clinic with any problems, questions or concerns. No barriers to learning were detected. I spent *** counseling the patient face to face. The total time spent in the appointment was *** and more than 50% was on counseling, review of test results, and coordination of care.   Santiago Glad, NP-C @DATE @

## 2023-05-04 ENCOUNTER — Telehealth: Payer: Self-pay | Admitting: Nurse Practitioner

## 2023-05-04 ENCOUNTER — Other Ambulatory Visit: Payer: BC Managed Care – PPO

## 2023-05-04 NOTE — Telephone Encounter (Signed)
Rescheduled missed 10/07 appointments, patient has been called and notified of new appointments.

## 2023-05-07 ENCOUNTER — Encounter: Payer: Self-pay | Admitting: Oncology

## 2023-05-07 ENCOUNTER — Other Ambulatory Visit: Payer: Self-pay

## 2023-05-07 ENCOUNTER — Other Ambulatory Visit (HOSPITAL_COMMUNITY): Payer: Self-pay

## 2023-05-11 ENCOUNTER — Other Ambulatory Visit (HOSPITAL_COMMUNITY): Payer: Self-pay

## 2023-05-11 ENCOUNTER — Encounter: Payer: Self-pay | Admitting: Oncology

## 2023-05-18 ENCOUNTER — Telehealth: Payer: Self-pay

## 2023-05-18 DIAGNOSIS — G4733 Obstructive sleep apnea (adult) (pediatric): Secondary | ICD-10-CM

## 2023-05-18 NOTE — Telephone Encounter (Signed)
Notified patient of sleep study results and recommendations. All questions were answered and patient verbalized understanding.CPAP order sent to [facility] today 05/18/23.

## 2023-05-27 ENCOUNTER — Other Ambulatory Visit (HOSPITAL_COMMUNITY): Payer: Self-pay

## 2023-05-29 NOTE — Progress Notes (Deleted)
Patient Care Team: Renaye Rakers, MD as PCP - General (Family Medicine) Bensimhon, Bevelyn Buckles, MD as PCP - Cardiology (Cardiology) Quintella Reichert, MD as PCP - Sleep Medicine (Cardiology) Malachy Mood, MD as Consulting Physician (Hematology and Oncology)   CHIEF COMPLAINT: Follow-up IDA and MGUS  CURRENT THERAPY: Oral and IV iron last given 10/16/2022  INTERVAL HISTORY Ms Layden presents as a transfer from Dr. Angelene Giovanni, last seen by him 01/29/2023.  She recently underwent a hysterectomy by Dr. Vincente Poli 04/28/2023.   ROS   Past Medical History:  Diagnosis Date   AICD (automatic cardioverter/defibrillator) present    Anemia    2024 iron infusions x 2, Follows w/ Dr. Leatha Gilding, hematology/oncology.   Asthma    asymptomatic   Atrial fibrillation (HCC)    hx of   CHF (congestive heart failure) (HCC)    Follows w/ Dr. Derenda Fennel, @ HF clinic. 01/29/23 Echo EF 45 - 50%   Chronic bronchitis (HCC)    2024 - Patient states she has not had bronchitis in many years.   Chronic systolic CHF (congestive heart failure), NYHA class 2 (HCC) 01/2006   Has MDT Defibrillator, EF had improved by 2017 echo   Gestational diabetes 2013   History of hiatal hernia    repaired 11/24/16   Hypertension    Follows w/ cardiologist.   Migraines    Has not had a migraine in many years per pt on 04/14/23.   Other primary cardiomyopathies    Paroxysmal ventricular tachycardia (HCC)    Pneumonia 09/04/2016   Community-acquired   Sleep apnea    Follows w/ Dr. Armanda Magic, cardiology. No cpap at the present (03/2023). Patient states she is waiting to be scheduled for a sleep study.   Varicose veins 1998   Developed during last pregnancy     Past Surgical History:  Procedure Laterality Date   CARDIAC CATHETERIZATION     EF 30-35%   CARDIAC CATHETERIZATION N/A 05/04/2015   Procedure: Left Heart Cath and Coronary Angiography;  Surgeon: Lyn Records, MD;  Location: [facility] INVASIVE CV LAB;  Service: Cardiovascular;   Laterality: N/A;   CARDIAC DEFIBRILLATOR PLACEMENT  2007   CESAREAN SECTION     1998 & 2014   HYSTERECTOMY ABDOMINAL WITH SALPINGECTOMY Bilateral 04/27/2023   Procedure: HYSTERECTOMY ABDOMINAL WITH SALPINGECTOMY;  Surgeon: Marcelle Overlie, MD;  Location: [facility];  Service: Gynecology;  Laterality: Bilateral;   IMPLANTABLE CARDIOVERTER DEFIBRILLATOR (ICD) GENERATOR CHANGE N/A 09/10/2014   Procedure: ICD GENERATOR CHANGE;  Surgeon: Marinus Maw, MD;  Location: [facility] CATH LAB;  Service: Cardiovascular;  Laterality: N/A;   INSERT / REPLACE / REMOVE PACEMAKER  02/10/2006   Status post implantable cardioverter-defibrillator insertion  .Marland KitchenMarland KitchenMarland KitchenThe Medtronic Maximo VR, model 7232, single chamber   LAPAROSCOPIC GASTRIC SLEEVE RESECTION WITH HIATAL HERNIA REPAIR N/A 11/24/2016   Procedure: LAPAROSCOPIC GASTRIC SLEEVE RESECTION WITH HIATAL HERNIA REPAIR, UPPER ENDO;  Surgeon: Gaynelle Adu, MD;  Location: [facility];  Service: General;  Laterality: N/A;   LAPAROSCOPIC OVARIAN CYSTECTOMY Bilateral 11/24/2016   Procedure: LAPAROSCOPIC BILATERAL OVARIAN CYSTECTOMY;  Surgeon: Myna Hidalgo, DO;  Location: [facility];  Service: Gynecology;  Laterality: Bilateral;   REDUCTION MAMMAPLASTY Bilateral 1995     Outpatient Encounter Medications as of 06/01/2023  Medication Sig   bisoprolol (ZEBETA) 5 MG tablet Take 1 tablet (5 mg total) by mouth at bedtime. (Patient taking differently: Take 2.5 mg by mouth at bedtime.)   Calcium-Vitamin D-Vitamin K (VIACTIV CALCIUM PLUS D) 650-12.5-40 MG-MCG-MCG CHEW  Chew 1 each by mouth in the morning and at bedtime.   cetirizine (ZYRTEC) 10 MG tablet Take 10 mg by mouth at bedtime as needed for allergies.   hydrALAZINE (APRESOLINE) 25 MG tablet Take 1 tablet (25 mg total) by mouth 3 (three) times daily. (Patient taking differently: Take 25 mg by mouth 2 (two) times daily.)   HYDROmorphone (DILAUDID) 2 MG tablet Take 1 tablet (2 mg total) by mouth every 3 (three) hours as needed for severe pain  ((when tolerating fluids)).   Iron-FA-B Cmp-C-Biot-Probiotic (FUSION PLUS) CAPS Take 1 capsule by mouth daily.   isosorbide mononitrate (IMDUR) 30 MG 24 hr tablet Take 1 tablet (30 mg total) by mouth daily. (Patient taking differently: Take 30 mg by mouth at bedtime.)   Multiple Vitamin (MULTIVITAMIN WITH MINERALS) TABS tablet Take 2 tablets by mouth daily. Chewable   pantoprazole (PROTONIX) 40 MG tablet Take 1 tablet (40 mg total) by mouth 2 (two) times daily before meals   potassium chloride SA (KLOR-CON M20) 20 MEQ tablet Take 1.5 tablets (30 mEq total) by mouth 2 (two) times daily.   sacubitril-valsartan (ENTRESTO) 97-103 MG Take 1 tablet by mouth 2 (two) times daily.   spironolactone (ALDACTONE) 25 MG tablet Take 1 tablet (25 mg total) by mouth daily.   torsemide (DEMADEX) 20 MG tablet Take 1 tablet (20 mg total) by mouth daily.   [DISCONTINUED] progesterone (PROMETRIUM) 200 MG capsule Take 1 capsule (200 mg total) by mouth daily. (Patient taking differently: Take 200 mg by mouth at bedtime.)   No facility-administered encounter medications on file as of 06/01/2023.     There were no vitals filed for this visit. There is no height or weight on file to calculate BMI.   PHYSICAL EXAM GENERAL:alert, no distress and comfortable SKIN: no rash  EYES: sclera clear NECK: without mass LYMPH:  no palpable cervical or supraclavicular lymphadenopathy  LUNGS: clear with normal breathing effort HEART: regular rate & rhythm, no lower extremity edema ABDOMEN: abdomen soft, non-tender and normal bowel sounds NEURO: alert & oriented x 3 with fluent speech, no focal motor/sensory deficits Breast exam:  PAC without erythema    CBC    Component Value Date/Time   WBC 9.4 04/28/2023 0412   RBC 3.89 04/28/2023 0412   HGB 11.1 (L) 04/28/2023 0412   HGB 12.2 11/26/2022 1458   HCT 35.0 (L) 04/28/2023 0412   PLT 235 04/28/2023 0412   PLT 250 11/26/2022 1458   MCV 90.0 04/28/2023 0412   MCH 28.5  04/28/2023 0412   MCHC 31.7 04/28/2023 0412   RDW 14.9 04/28/2023 0412   LYMPHSABS 2.9 01/29/2023 0842   MONOABS 0.5 01/29/2023 0842   EOSABS 0.2 01/29/2023 0842   BASOSABS 0.0 01/29/2023 0842     CMP     Component Value Date/Time   NA 139 04/28/2023 0412   K 4.1 04/28/2023 0412   CL 104 04/28/2023 0412   CO2 26 04/28/2023 0412   GLUCOSE 123 (H) 04/28/2023 0412   BUN 18 04/28/2023 0412   CREATININE 1.19 (H) 04/28/2023 0412   CREATININE 1.10 (H) 11/26/2022 1449   CALCIUM 8.1 (L) 04/28/2023 0412   PROT 6.9 11/26/2022 1449   ALBUMIN 3.7 11/26/2022 1449   AST 11 (L) 11/26/2022 1449   ALT 10 11/26/2022 1449   ALKPHOS 55 11/26/2022 1449   BILITOT 0.3 11/26/2022 1449   GFRNONAA 56 (L) 04/28/2023 0412   GFRNONAA >60 11/26/2022 1449   GFRAA >60 11/27/2019 1003  ASSESSMENT & PLAN:  PLAN:  No orders of the defined types were placed in this encounter.     All questions were answered. The patient knows to call the clinic with any problems, questions or concerns. No barriers to learning were detected. I spent *** counseling the patient face to face. The total time spent in the appointment was *** and more than 50% was on counseling, review of test results, and coordination of care.   Santiago Glad, NP-C @DATE @

## 2023-06-01 ENCOUNTER — Other Ambulatory Visit: Payer: Self-pay | Admitting: Nurse Practitioner

## 2023-06-01 ENCOUNTER — Inpatient Hospital Stay: Payer: BC Managed Care – PPO | Admitting: Nurse Practitioner

## 2023-06-01 DIAGNOSIS — D472 Monoclonal gammopathy: Secondary | ICD-10-CM

## 2023-06-11 ENCOUNTER — Telehealth: Payer: Self-pay

## 2023-06-11 NOTE — Telephone Encounter (Signed)
-----   Message from Armanda Magic sent at 06/11/2023  8:06 AM EST ----- Minimal drop in O2 sats on CPAP.  No indication at this time for supplemental oxygen

## 2023-06-11 NOTE — Telephone Encounter (Signed)
Call to patient to discuss oximetry results. No answer, left detailed message per [facility] regarding minimal drop in O2 sats on CPAP.  Advised that there is no indication at this time for supplemental oxygen and to call our office with any questions.

## 2023-07-26 ENCOUNTER — Encounter: Payer: Self-pay | Admitting: Oncology

## 2023-08-02 ENCOUNTER — Other Ambulatory Visit (HOSPITAL_COMMUNITY): Payer: Self-pay

## 2023-08-03 ENCOUNTER — Other Ambulatory Visit (HOSPITAL_COMMUNITY): Payer: Self-pay

## 2023-08-10 ENCOUNTER — Other Ambulatory Visit: Payer: Self-pay

## 2023-08-11 ENCOUNTER — Other Ambulatory Visit (HOSPITAL_COMMUNITY): Payer: Self-pay

## 2023-08-20 ENCOUNTER — Other Ambulatory Visit (HOSPITAL_COMMUNITY): Payer: Self-pay

## 2023-08-23 ENCOUNTER — Other Ambulatory Visit (HOSPITAL_COMMUNITY): Payer: Self-pay

## 2023-08-30 ENCOUNTER — Other Ambulatory Visit (HOSPITAL_COMMUNITY): Payer: Self-pay

## 2023-08-30 ENCOUNTER — Encounter (HOSPITAL_COMMUNITY): Payer: Self-pay

## 2023-09-14 ENCOUNTER — Other Ambulatory Visit (HOSPITAL_COMMUNITY): Payer: Self-pay

## 2023-10-12 ENCOUNTER — Telehealth (HOSPITAL_COMMUNITY): Payer: Self-pay

## 2023-10-12 ENCOUNTER — Encounter: Payer: Self-pay | Admitting: Oncology

## 2023-10-12 NOTE — Telephone Encounter (Signed)
 Called to confirm/remind patient of their appointment [facility] on 10[facility]13/[facility].   Patient reminded to bring all medications and/or complete list.  Confirmed patient has transportation. Gave directions, instructed to utilize valet parking.  Confirmed appointment prior to ending call.

## 2023-10-12 NOTE — Progress Notes (Signed)
 Advanced [facility] Failure Clinic Note   PCP: Dr. Kellie Shropshire  OB:  Dr. Pennie Rushing  EP: Dr. Ladona Ridgel HF Cardiologist: Gala Romney  HPI: [name] is a 48 y.o. female with h/o obesity, chronic systolic heart failure due to nonischemic cardiomyopathy (cath 2007. No CAD) s/p Medtronic ICD implantation - now with recovered EF, asthma/chronic bronchitic, gastric sleeve, and severe HTN  Echo 7/18 EF 60-65%   Echo 10/19 EF 25-30% (in setting of stopping meds). Meds were resumed. Echo repeated 09/2018 and EF improved to 45-50%.   Follow up 9/22, stable NYHA II and volume stable.  On good GDMT, planned for follow up with repeat echo x 6 months, unfortunately lost to follow up.  Echo 01/29/23, EF 45-50%, normal RV  S/p abdominal hysterectomy with bilateral salpingectomy 10/24.  HST 9/24 showed mild OSA AHI, 8.4/hr  Today she returns for HF follow up. Overall feeling fine. Busy with work at [employer]. Feels more SOB, weight up 7-8 lbs. SHe is SOB walking up steps, has knee effusion that limits her physically. Holding fluid in belly and legs. Occasional dizziness. Denies palpitations, abnormal bleeding, CP, dizziness, or PND/Orthopnea.Has early satiety. Weight at home 224 pounds. Taking all medications. Has CPAP, not wearing. Working out at [employer] and walking outside for exercise, weight not budging.    Cardiac Testing  - Echo (7/24): EF 45-50%, normal RV - Echo (5/21): EF 45-50% - Echo (3/20): EF 45-50% - Echo (10/19): EF 25-30% (2/2 to stopping GDMT) - Echo (7/18): EF 60-65% - Echo (3/17): EF 55-60%, grade I DD - Echo (1/16): EF 25-30% - Echo (5/14): EF 35% - Echo (2/14): EF 50% - Echo (1/14): EF 40% - Echo (12/13): EF 40% - Echo (1013): EF 40% - Echo (1/13): EF 55-60% - Echo (10/12): EF 45-50%                                    - CPX 01/2014: Resting HR 75, Peak HR 121 Peak VO2 15.2, 74.4% predicted VE/VCO2 slope: 26.4 OUES: 1.93 Peak RER 1.15 VE/MVV 61.7%  SH: Married. Lives  in [place], 2 children. Working at [employer]. No ETOH or smoking  FH: Mother living: DM2, HTN, no CAD        Father living: DM2, HTN  Review of systems complete and found to be negative unless listed in HPI.   Past Medical History:  Diagnosis Date   AICD (automatic cardioverter/defibrillator) present    Anemia    2024 iron infusions x 2, Follows w/ Dr. Leatha Gilding, hematology/oncology.   Asthma    asymptomatic   Atrial fibrillation (HCC)    hx of   CHF (congestive heart failure) (HCC)    Follows w/ Dr. Derenda Fennel, @ HF clinic. 01/29/23 Echo EF 45 - 50%   Chronic bronchitis (HCC)    2024 - Patient states she has not had bronchitis in many years.   Chronic systolic CHF (congestive heart failure), NYHA class 2 (HCC) 01/2006   Has MDT Defibrillator, EF had improved by 2017 echo   Gestational diabetes 2013   History of hiatal hernia    repaired 11/24/16   Hypertension    Follows w/ cardiologist.   Migraines    Has not had a migraine in many years per pt on 04/14/23.   Other primary cardiomyopathies    Paroxysmal ventricular tachycardia (HCC)    Pneumonia 09/04/2016   Community-acquired  Sleep apnea    Follows w/ Dr. Armanda Magic, cardiology. No cpap at the present (03/2023). Patient states she is waiting to be scheduled for a sleep study.   Varicose veins 1998   Developed during last pregnancy   Current Outpatient Medications  Medication Sig Dispense Refill   bisoprolol (ZEBETA) 5 MG tablet Take 1 tablet (5 mg total) by mouth at bedtime. (Patient taking differently: Take 2.5 mg by mouth at bedtime.) 30 tablet 6   cetirizine (ZYRTEC) 10 MG tablet Take 10 mg by mouth at bedtime as needed for allergies.     hydrALAZINE (APRESOLINE) 25 MG tablet Take 1 tablet (25 mg total) by mouth 3 (three) times daily. (Patient taking differently: Take 25 mg by mouth 2 (two) times daily.) 270 tablet 3   isosorbide mononitrate (IMDUR) 30 MG 24 hr tablet Take 1 tablet (30 mg total) by mouth  daily. (Patient taking differently: Take 30 mg by mouth at bedtime.) 30 tablet 6   Multiple Vitamin (MULTIVITAMIN WITH MINERALS) TABS tablet Take 2 tablets by mouth daily. Chewable     potassium chloride SA (KLOR-CON M20) 20 MEQ tablet Take 1.5 tablets (30 mEq total) by mouth 2 (two) times daily. 90 tablet 6   sacubitril-valsartan (ENTRESTO) 97-103 MG Take 1 tablet by mouth 2 (two) times daily. 180 tablet 3   spironolactone (ALDACTONE) 25 MG tablet Take 1 tablet (25 mg total) by mouth daily. 30 tablet 11   torsemide (DEMADEX) 20 MG tablet Take 1 tablet (20 mg total) by mouth daily. 30 tablet 11   No current facility-administered medications for this encounter.   BP (!) 138/94   Pulse 77   Wt 102.7 kg (226 lb 6.4 oz)   SpO2 98%   BMI 42.43 kg/m    Wt Readings from Last 3 Encounters:  10/13/23 102.7 kg (226 lb 6.4 oz)  04/27/23 99.3 kg (219 lb)  04/21/23 99.4 kg (219 lb 2 oz)   PHYSICAL EXAM: General:  NAD. No resp difficulty, walked into clinic HEENT: Normal Neck: Supple. No JVD. Cor: Regular rate & rhythm. No rubs, gallops or murmurs. Lungs: Clear Abdomen: Soft, obese, nontender, nondistended.  Extremities: No cyanosis, clubbing, rash, edema Neuro: Alert & oriented x 3, moves all 4 extremities w/o difficulty. Affect pleasant.  ECG (personally reviewed): NSR 76 bpm  ReDs reading: 33 %, normal  Device interrogation (personally reviewed): OptiVol stable, no VT, 2.6 hr/day activity, < 0.1% VP  ASSESSMENT & PLAN:  1) Chronic combined systolic/diastolic heart failure  - NICM  - s/p ICD likely r/t HTN,  - EF in 2016 25-30%.  - Cath 8/16 showed normal cors - Echo (7/18): EF 55-60% - Echo (10/19): EF 25-30% in setting of medication noncompliance - Echo (3/20): EF 45-50% - Echo (5/21): EF 45-50% - Echo  (01/29/23): EF 45-50%  - NYHA II (stable), volume ok on exam, ReDs normal at 33%, but weight up 7-8 lbs - Off Farxiga with frequent yeast infections, will trial Jardiance 10 mg  daily. I asked her to call if she has GU symptoms - Continue torsemide 20 mg daily + 30 KCL bid for now, may be able to pull back later - Continue Entresto 97/103 bid - Continue spiro 25 mg daily - Continue bisoprolol 2.5 mg at bedtime  - Continue hydralazine 25 mg tid (has been taking bid) + Imdur 30 mg daily. - Labs today.  2) OSA  - Does not wear CPAP  - HST 9/24 showed mild OSA, AHI 8.4/hr -  Follows with Dr. [name] - Encouraged her to wear her CPAP  3) HTN - BP up a bit - GDMT as above - I asked her to take her hydralazine tid  - Weight loss should help (see below)  4) Obesity - S/p laparoscopic sleeve gastrectomy with hiatal hernia repair, upper endoscopy.  - Body mass index is 42.43 kg/m. - We discussed GLP1RA, she is interested. Refer to PharmD for tirzepatide vs semaglutide (check A1C, she has OSA so hopefully insurance will approve).  5) Anemia - Felt to be 2/2 heavy menstrual bleeding - s/p abd hysterectomy  Follow up in 6 months with Dr. Gala Romney.  Jacklynn Ganong, FNP  8:48 AM

## 2023-10-13 ENCOUNTER — Ambulatory Visit (HOSPITAL_COMMUNITY)
Admission: RE | Admit: 2023-10-13 | Discharge: 2023-10-13 | Disposition: A | Discharge status: Home / Self Care | Payer: Self-pay | Source: Ambulatory Visit | Attending: Family Medicine | Admitting: Family Medicine

## 2023-10-13 ENCOUNTER — Encounter: Payer: Self-pay | Admitting: Oncology

## 2023-10-13 ENCOUNTER — Encounter (HOSPITAL_COMMUNITY): Payer: Self-pay

## 2023-10-13 ENCOUNTER — Other Ambulatory Visit (HOSPITAL_COMMUNITY): Payer: Self-pay

## 2023-10-13 ENCOUNTER — Telehealth (HOSPITAL_COMMUNITY): Payer: Self-pay

## 2023-10-13 VITALS — BP 138/94 | HR 77 | Wt 226.4 lb

## 2023-10-13 DIAGNOSIS — I10 Essential (primary) hypertension: Secondary | ICD-10-CM

## 2023-10-13 DIAGNOSIS — I110 Hypertensive heart disease with heart failure: Secondary | ICD-10-CM | POA: Insufficient documentation

## 2023-10-13 DIAGNOSIS — I428 Other cardiomyopathies: Secondary | ICD-10-CM | POA: Insufficient documentation

## 2023-10-13 DIAGNOSIS — D649 Anemia, unspecified: Secondary | ICD-10-CM | POA: Diagnosis not present

## 2023-10-13 DIAGNOSIS — J4489 Other specified chronic obstructive pulmonary disease: Secondary | ICD-10-CM | POA: Insufficient documentation

## 2023-10-13 DIAGNOSIS — Z95810 Presence of automatic (implantable) cardiac defibrillator: Secondary | ICD-10-CM | POA: Insufficient documentation

## 2023-10-13 DIAGNOSIS — Z6841 Body Mass Index (BMI) 40.0 and over, adult: Secondary | ICD-10-CM | POA: Diagnosis not present

## 2023-10-13 DIAGNOSIS — I5022 Chronic systolic (congestive) heart failure: Secondary | ICD-10-CM

## 2023-10-13 DIAGNOSIS — I5042 Chronic combined systolic (congestive) and diastolic (congestive) heart failure: Secondary | ICD-10-CM | POA: Insufficient documentation

## 2023-10-13 DIAGNOSIS — Z9884 Bariatric surgery status: Secondary | ICD-10-CM | POA: Diagnosis not present

## 2023-10-13 DIAGNOSIS — G4733 Obstructive sleep apnea (adult) (pediatric): Secondary | ICD-10-CM | POA: Diagnosis not present

## 2023-10-13 DIAGNOSIS — D500 Iron deficiency anemia secondary to blood loss (chronic): Secondary | ICD-10-CM

## 2023-10-13 DIAGNOSIS — I5032 Chronic diastolic (congestive) heart failure: Secondary | ICD-10-CM

## 2023-10-13 LAB — BASIC METABOLIC PANEL
Anion gap: 11 (ref 5–15)
BUN: 12 mg/dL (ref 6–20)
CO2: 28 mmol/L (ref 22–32)
Calcium: 8.9 mg/dL (ref 8.9–10.3)
Chloride: 102 mmol/L (ref 98–111)
Creatinine, Ser: 1.11 mg/dL — ABNORMAL HIGH (ref 0.44–1.00)
GFR, Estimated: >60 mL/min (ref >60)
Glucose, Bld: 105 mg/dL — ABNORMAL HIGH (ref 70–99)
Potassium: 4 mmol/L (ref 3.5–5.1)
Sodium: 141 mmol/L (ref 135–145)

## 2023-10-13 LAB — HEMOGLOBIN A1C
Hgb A1c MFr Bld: 6.6 % — ABNORMAL HIGH (ref 4.8–5.6)
Mean Plasma Glucose: 142.72 mg/dL

## 2023-10-13 LAB — BRAIN NATRIURETIC PEPTIDE: B Natriuretic Peptide: 62.9 pg/mL (ref 0.0–100.0)

## 2023-10-13 MED ORDER — EMPAGLIFLOZIN 10 MG PO TABS
10.0000 mg | ORAL_TABLET | Freq: Every day | ORAL | 6 refills | Status: DC
  Filled 2023-10-13: qty 30, 30d supply, fill #0

## 2023-10-13 NOTE — Progress Notes (Signed)
 ReDS Vest / Clip - 10/13/23 0900       ReDS Vest / Clip   Station Marker B    Ruler Value 36.5    ReDS Value Range Low volume    ReDS Actual Value 33

## 2023-10-13 NOTE — Addendum Note (Signed)
 Encounter addended by: Faythe Casa, CMA on: 10/13/2023 9:47 AM  Actions taken: Visit diagnoses modified, Order list changed, Diagnosis association updated, Flowsheet accepted, Clinical Note Signed, Charge Capture section accepted

## 2023-10-13 NOTE — Patient Instructions (Signed)
 Medication Changes:  START Jardiance 10mg  (1 tab) daily  Lab Work:  Labs done today, your results will be available in MyChart, we will contact you for abnormal readings.  Referrals:  You have been referred to the Heart Failure Pharmacist. They will get in contact with you in order to schedule an appointment.    Follow-Up in: Please follow up with the [facility] Clinic in 6 months with Dr. Gala Romney. We currently do not have that schedule. Please contact us in August in order to schedule your appointment for September.  At the [facility], you and your health needs are our priority. We have a designated team specialized in the treatment of Heart Failure. This Care Team includes your primary Heart Failure Specialized Cardiologist (physician), Advanced Practice Providers (APPs- Physician Assistants and Nurse Practitioners), and Pharmacist who all work together to provide you with the care you need, when you need it.   You may see any of the following providers on your designated Care Team at your next follow up:  Dr. Arvilla Meres Dr. Marca Ancona Dr. Dorthula Nettles Dr. Theresia Bough Tonye Becket, NP Robbie Lis, [place] [facility] [place], [place] Brynda Peon, NP [place] Lee, NP Karle Plumber, PharmD   Please be sure to bring in all your medications bottles to every appointment.   Need to Contact us:  If you have any questions or concerns before your next appointment please send [place] a message through [place] or call our office at [phone].    TO LEAVE A MESSAGE FOR THE NURSE SELECT OPTION 2, PLEASE LEAVE A MESSAGE INCLUDING: YOUR NAME DATE OF BIRTH CALL BACK NUMBER REASON FOR CALL**this is important as we prioritize the call backs  YOU WILL RECEIVE A CALL BACK THE SAME DAY AS LONG AS YOU CALL BEFORE 4:00 PM

## 2023-10-13 NOTE — Telephone Encounter (Signed)
 Advanced Heart Failure Patient Advocate Encounter  Test billing for Jardiance returns $30 for 30 days, $90 for 90 days. This patient is eligible to use copay savings card.  Burnell Blanks, CPhT Rx Patient Advocate Phone: [phone]

## 2023-10-13 NOTE — Addendum Note (Signed)
 Encounter addended by: Nicole Cella, RN on: 10/13/2023 11:03 AM  Actions taken: Charge Capture section accepted

## 2023-10-14 ENCOUNTER — Telehealth (HOSPITAL_COMMUNITY): Payer: Self-pay

## 2023-10-14 NOTE — Telephone Encounter (Signed)
-----   Message from Jacklynn Ganong sent at 10/13/2023  4:15 PM EDT ----- Labs stable but A1C now in diabetes range. Hopefully she will get approved for GLP1RA med (Ozempic, Mounjaro) In mean time, would like her to follow up with her PCP regarding further management

## 2023-10-14 NOTE — Telephone Encounter (Signed)
 Spoke with patient regarding the following results. Patient made aware and patient verbalized understanding.   Patient would like labs sent to PCP- this has been faxed via Epic fax function.

## 2023-10-20 ENCOUNTER — Encounter: Payer: Self-pay | Admitting: Internal Medicine

## 2023-10-29 ENCOUNTER — Other Ambulatory Visit (HOSPITAL_COMMUNITY): Payer: Self-pay

## 2023-10-29 ENCOUNTER — Encounter: Payer: Self-pay | Admitting: Oncology

## 2023-10-29 MED ORDER — MOUNJARO 2.5 MG/0.5ML ~~LOC~~ SOAJ
2.5000 mg | SUBCUTANEOUS | 0 refills | Status: AC
Start: 2023-10-29 — End: ?
  Filled 2023-10-29 – 2024-01-27 (×6): qty 2, 28d supply, fill #0

## 2023-11-01 ENCOUNTER — Other Ambulatory Visit (HOSPITAL_COMMUNITY): Payer: Self-pay

## 2023-11-08 ENCOUNTER — Other Ambulatory Visit (HOSPITAL_COMMUNITY): Payer: Self-pay

## 2023-11-08 ENCOUNTER — Telehealth (HOSPITAL_COMMUNITY): Payer: Self-pay

## 2023-11-08 MED ORDER — FLUCONAZOLE 150 MG PO TABS
150.0000 mg | ORAL_TABLET | Freq: Once | ORAL | 0 refills | Status: AC
  Filled 2023-11-08: qty 1, 1d supply, fill #0

## 2023-11-08 NOTE — Telephone Encounter (Signed)
 Ruddy Corral M, PA-C  You32 minutes ago (3:21 PM)    Can prescribe Diflucan x 1 dose 150 mg x 1.    Called and spoke with patient, and made her aware. Medication sent to patient preferred pharmacy. Jardiance removed from medication list.

## 2023-11-08 NOTE — Telephone Encounter (Signed)
 Received phone message from patient who reports that she has a yeast infection since starting the Jardiance. Patient reports that she has stopped the Jardiance but reports that she does need something to help treat the yeast infection and is requesting diflucan.    Will forward to provider for review and advice.

## 2023-12-22 ENCOUNTER — Other Ambulatory Visit (HOSPITAL_COMMUNITY): Payer: Self-pay | Admitting: Family Medicine

## 2023-12-22 ENCOUNTER — Other Ambulatory Visit: Payer: Self-pay

## 2023-12-22 ENCOUNTER — Other Ambulatory Visit (HOSPITAL_COMMUNITY): Payer: Self-pay | Admitting: Internal Medicine

## 2023-12-24 ENCOUNTER — Encounter: Payer: Self-pay | Admitting: Oncology

## 2023-12-24 ENCOUNTER — Other Ambulatory Visit (HOSPITAL_COMMUNITY): Payer: Self-pay

## 2023-12-24 MED ORDER — POTASSIUM CHLORIDE CRYS ER 20 MEQ PO TBCR
30.0000 meq | EXTENDED_RELEASE_TABLET | Freq: Two times a day (BID) | ORAL | 1 refills | Status: DC
  Filled 2023-12-24: qty 90, 30d supply, fill #0
  Filled 2024-01-22: qty 90, 30d supply, fill #1

## 2023-12-24 MED ORDER — HYDRALAZINE HCL 25 MG PO TABS
25.0000 mg | ORAL_TABLET | Freq: Three times a day (TID) | ORAL | 1 refills | Status: DC
  Filled 2023-12-24 (×2): qty 270, 90d supply, fill #0
  Filled 2024-03-30: qty 270, 90d supply, fill #1

## 2023-12-27 ENCOUNTER — Other Ambulatory Visit (HOSPITAL_COMMUNITY): Payer: Self-pay

## 2023-12-29 ENCOUNTER — Other Ambulatory Visit (HOSPITAL_COMMUNITY): Payer: Self-pay

## 2024-01-10 ENCOUNTER — Other Ambulatory Visit (HOSPITAL_COMMUNITY): Payer: Self-pay

## 2024-01-10 MED ORDER — NYSTATIN-TRIAMCINOLONE 100000-0.1 UNIT/GM-% EX CREA
1.0000 | TOPICAL_CREAM | Freq: Two times a day (BID) | CUTANEOUS | 3 refills | Status: AC
  Filled 2024-01-10: qty 30, 15d supply, fill #0
  Filled 2024-01-22: qty 30, 15d supply, fill #1
  Filled 2024-02-29: qty 30, 15d supply, fill #2
  Filled 2024-05-03: qty 30, 15d supply, fill #3

## 2024-01-12 ENCOUNTER — Other Ambulatory Visit (HOSPITAL_COMMUNITY): Payer: Self-pay

## 2024-01-21 ENCOUNTER — Telehealth (HOSPITAL_COMMUNITY): Payer: Self-pay | Admitting: Cardiology

## 2024-01-21 DIAGNOSIS — I5022 Chronic systolic (congestive) heart failure: Secondary | ICD-10-CM

## 2024-01-21 NOTE — Telephone Encounter (Signed)
 Patient called to request another referral for GLP1 medications Reports she was referred in March and did not keep the appt, reports her PCP was attempting to get medication at the same time so went with pcp.  PCP failed to submit proper documentation and has never started medication, would like HF provider to referral.  New referral placed

## 2024-01-22 ENCOUNTER — Other Ambulatory Visit: Payer: Self-pay

## 2024-01-24 ENCOUNTER — Encounter: Payer: Self-pay | Admitting: Oncology

## 2024-01-24 ENCOUNTER — Other Ambulatory Visit: Payer: Self-pay

## 2024-01-24 ENCOUNTER — Other Ambulatory Visit (HOSPITAL_COMMUNITY): Payer: Self-pay

## 2024-01-27 ENCOUNTER — Other Ambulatory Visit (HOSPITAL_COMMUNITY): Payer: Self-pay

## 2024-01-27 MED ORDER — METHYLPREDNISOLONE 4 MG PO TBPK
ORAL_TABLET | ORAL | 0 refills | Status: AC
  Filled 2024-01-27 – 2024-02-29 (×3): qty 21, 6d supply, fill #0

## 2024-01-27 MED ORDER — CYCLOBENZAPRINE HCL 10 MG PO TABS
5.0000 mg | ORAL_TABLET | Freq: Three times a day (TID) | ORAL | 1 refills | Status: AC | PRN
  Filled 2024-01-27 (×2): qty 30, 10d supply, fill #0

## 2024-02-22 ENCOUNTER — Other Ambulatory Visit (HOSPITAL_COMMUNITY): Payer: Self-pay

## 2024-02-22 MED ORDER — MOUNJARO 5 MG/0.5ML ~~LOC~~ SOAJ
5.0000 mg | SUBCUTANEOUS | 0 refills | Status: AC
  Filled 2024-02-22: qty 2, 28d supply, fill #0

## 2024-02-29 ENCOUNTER — Other Ambulatory Visit (HOSPITAL_COMMUNITY): Payer: Self-pay | Admitting: Family Medicine

## 2024-02-29 ENCOUNTER — Other Ambulatory Visit (HOSPITAL_COMMUNITY): Payer: Self-pay | Admitting: Cardiology

## 2024-02-29 ENCOUNTER — Other Ambulatory Visit (HOSPITAL_COMMUNITY): Payer: Self-pay | Admitting: Internal Medicine

## 2024-03-01 ENCOUNTER — Other Ambulatory Visit (HOSPITAL_COMMUNITY): Payer: Self-pay

## 2024-03-01 ENCOUNTER — Telehealth (HOSPITAL_COMMUNITY): Payer: Self-pay

## 2024-03-01 ENCOUNTER — Encounter: Payer: Self-pay | Admitting: Oncology

## 2024-03-01 ENCOUNTER — Other Ambulatory Visit: Payer: Self-pay

## 2024-03-01 MED ORDER — POTASSIUM CHLORIDE CRYS ER 20 MEQ PO TBCR
30.0000 meq | EXTENDED_RELEASE_TABLET | Freq: Two times a day (BID) | ORAL | 1 refills | Status: DC
  Filled 2024-03-01: qty 90, 30d supply, fill #0
  Filled 2024-03-30: qty 90, 30d supply, fill #1

## 2024-03-01 MED ORDER — SACUBITRIL-VALSARTAN 97-103 MG PO TABS
1.0000 | ORAL_TABLET | Freq: Two times a day (BID) | ORAL | 3 refills | Status: DC
  Filled 2024-03-01 – 2024-03-02 (×2): qty 180, 90d supply, fill #0
  Filled 2024-05-27: qty 180, 90d supply, fill #1

## 2024-03-01 MED ORDER — BISOPROLOL FUMARATE 5 MG PO TABS
5.0000 mg | ORAL_TABLET | Freq: Every evening | ORAL | 6 refills | Status: DC
  Filled 2024-03-01: qty 30, 30d supply, fill #0
  Filled 2024-03-30: qty 30, 30d supply, fill #1
  Filled 2024-05-03: qty 30, 30d supply, fill #2

## 2024-03-01 MED ORDER — SPIRONOLACTONE 25 MG PO TABS
25.0000 mg | ORAL_TABLET | Freq: Every day | ORAL | 6 refills | Status: DC
  Filled 2024-03-01: qty 30, 30d supply, fill #0
  Filled 2024-03-30: qty 30, 30d supply, fill #1
  Filled 2024-05-03: qty 30, 30d supply, fill #2

## 2024-03-01 MED ORDER — ISOSORBIDE MONONITRATE ER 30 MG PO TB24
30.0000 mg | ORAL_TABLET | Freq: Every day | ORAL | 6 refills | Status: DC
  Filled 2024-03-01: qty 30, 30d supply, fill #0
  Filled 2024-03-30: qty 30, 30d supply, fill #1
  Filled 2024-05-03: qty 30, 30d supply, fill #2

## 2024-03-02 ENCOUNTER — Other Ambulatory Visit (HOSPITAL_COMMUNITY): Payer: Self-pay

## 2024-03-02 ENCOUNTER — Encounter: Payer: Self-pay | Admitting: Oncology

## 2024-03-02 NOTE — Telephone Encounter (Signed)
 Advanced Heart Failure Patient Advocate Encounter  Prior authorization for Entresto  has been submitted and approved. Test billing returns $30 for 30 day supply, $90 for 90 days. This patient is eligible to use copay savings cards that would reduce cost of medication.  Key: [id number] Effective: 03/02/2024 to 03/02/2025  Rachel DEL, CPhT Rx Patient Advocate Phone: [phone]

## 2024-03-22 ENCOUNTER — Other Ambulatory Visit (HOSPITAL_COMMUNITY): Payer: Self-pay

## 2024-03-22 MED ORDER — MONTELUKAST SODIUM 10 MG PO TABS
10.0000 mg | ORAL_TABLET | Freq: Every day | ORAL | 1 refills | Status: AC
  Filled 2024-03-22 – 2024-05-27 (×3): qty 60, 60d supply, fill #0

## 2024-03-22 MED ORDER — MOUNJARO 7.5 MG/0.5ML ~~LOC~~ SOAJ
7.5000 mg | SUBCUTANEOUS | 0 refills | Status: DC
  Filled 2024-03-22 – 2024-04-19 (×2): qty 2, 28d supply, fill #0

## 2024-03-23 ENCOUNTER — Encounter: Payer: Self-pay | Admitting: Oncology

## 2024-03-23 ENCOUNTER — Other Ambulatory Visit (HOSPITAL_COMMUNITY): Payer: Self-pay

## 2024-04-19 ENCOUNTER — Other Ambulatory Visit (HOSPITAL_COMMUNITY): Payer: Self-pay

## 2024-04-19 MED ORDER — CITALOPRAM HYDROBROMIDE 20 MG PO TABS
ORAL_TABLET | ORAL | 0 refills | Status: DC
  Filled 2024-04-19: qty 85, 90d supply, fill #0

## 2024-04-19 MED ORDER — MOUNJARO 10 MG/0.5ML ~~LOC~~ SOAJ
10.0000 mg | SUBCUTANEOUS | 1 refills | Status: AC
  Filled 2024-04-19 – 2024-04-20 (×2): qty 2, 28d supply, fill #0

## 2024-04-20 ENCOUNTER — Other Ambulatory Visit (HOSPITAL_COMMUNITY): Payer: Self-pay

## 2024-05-03 ENCOUNTER — Other Ambulatory Visit (HOSPITAL_COMMUNITY): Payer: Self-pay | Admitting: Internal Medicine

## 2024-05-03 ENCOUNTER — Other Ambulatory Visit (HOSPITAL_COMMUNITY): Payer: Self-pay | Admitting: Cardiology

## 2024-05-04 ENCOUNTER — Other Ambulatory Visit: Payer: Self-pay

## 2024-05-04 ENCOUNTER — Other Ambulatory Visit (HOSPITAL_COMMUNITY): Payer: Self-pay

## 2024-05-04 MED ORDER — POTASSIUM CHLORIDE CRYS ER 20 MEQ PO TBCR
30.0000 meq | EXTENDED_RELEASE_TABLET | Freq: Two times a day (BID) | ORAL | 0 refills | Status: DC
  Filled 2024-05-04: qty 90, 30d supply, fill #0

## 2024-05-04 MED ORDER — HYDRALAZINE HCL 25 MG PO TABS
25.0000 mg | ORAL_TABLET | Freq: Three times a day (TID) | ORAL | 0 refills | Status: DC
  Filled 2024-05-04: qty 270, 90d supply, fill #0

## 2024-05-04 MED ORDER — TORSEMIDE 20 MG PO TABS
20.0000 mg | ORAL_TABLET | Freq: Every day | ORAL | 0 refills | Status: DC
  Filled 2024-05-04: qty 30, 30d supply, fill #0

## 2024-05-10 ENCOUNTER — Encounter (HOSPITAL_COMMUNITY): Payer: Self-pay

## 2024-05-10 ENCOUNTER — Other Ambulatory Visit (HOSPITAL_COMMUNITY): Payer: Self-pay

## 2024-05-10 MED ORDER — MOUNJARO 12.5 MG/0.5ML ~~LOC~~ SOAJ
12.5000 mg | SUBCUTANEOUS | 0 refills | Status: AC
  Filled 2024-05-10 – 2024-05-15 (×2): qty 2, 28d supply, fill #0

## 2024-05-16 ENCOUNTER — Other Ambulatory Visit (HOSPITAL_COMMUNITY): Payer: Self-pay

## 2024-05-27 ENCOUNTER — Other Ambulatory Visit: Payer: Self-pay

## 2024-05-29 ENCOUNTER — Other Ambulatory Visit (HOSPITAL_COMMUNITY): Payer: Self-pay

## 2024-05-31 ENCOUNTER — Other Ambulatory Visit (HOSPITAL_COMMUNITY): Payer: Self-pay

## 2024-05-31 MED ORDER — MOUNJARO 15 MG/0.5ML ~~LOC~~ SOAJ
15.0000 mg | SUBCUTANEOUS | 1 refills | Status: AC
  Filled 2024-05-31 – 2024-06-12 (×3): qty 6, 84d supply, fill #0

## 2024-06-01 ENCOUNTER — Telehealth (HOSPITAL_COMMUNITY): Payer: Self-pay | Admitting: Cardiology

## 2024-06-01 NOTE — Telephone Encounter (Signed)
 Received a message from office staff that DR Benjamine would like to speak with Dr Cherrie directly   Returned  call to facilitate and or assist in any way-LMOM   Will route to provider Office number [phone]

## 2024-06-05 NOTE — Telephone Encounter (Signed)
 Patient called for update on provider to provider call. Advised at this time unable to confirm if Dr Cherrie was able to speak with Dr Benjamine    Patient reports b/p has been running low 75/53 83/57  Weight is down, currently 189 previously 226  Reports dizziness at times and increased fatigue  Follow up scheduled 11/14  Advised would forward to triage providers if changes are needed before upcoming appt

## 2024-06-06 ENCOUNTER — Ambulatory Visit

## 2024-06-06 DIAGNOSIS — I428 Other cardiomyopathies: Secondary | ICD-10-CM | POA: Diagnosis not present

## 2024-06-06 NOTE — Telephone Encounter (Signed)
 Pt aware and voiced understanding  Med list updated  Message to device clinic to assist with transmission

## 2024-06-06 NOTE — Telephone Encounter (Signed)
 Would you mind helping pt with manual transmission this send back to triage? Looks like her most recent transmission was not processed.   Thanks

## 2024-06-07 ENCOUNTER — Telehealth (HOSPITAL_COMMUNITY): Payer: Self-pay | Admitting: Cardiology

## 2024-06-07 NOTE — Progress Notes (Incomplete)
 Advanced Heart Failure Clinic Note   PCP: Dr. Luke Lamer  OB:  Dr. Raeanne  EP: Dr. Waddell HF Cardiologist: Dr. Cherrie  HPI: [name] is a 48 y.o. female with h/o obesity, chronic systolic heart failure due to nonischemic cardiomyopathy (cath 2007. No CAD) s/p Medtronic ICD implantation - now with recovered EF, asthma/chronic bronchitic, gastric sleeve, and severe HTN  Echo 7/18 EF 60-65%   Echo 10/19 EF 25-30% (in setting of stopping meds). Meds were resumed. Echo repeated 09/2018 and EF improved to 45-50%.   Follow up 9/22, stable NYHA II and volume stable.  On good GDMT, planned for follow up with repeat echo x 6 months, unfortunately lost to follow up.  S/p abdominal hysterectomy with bilateral salpingectomy 10/24.  HST 9/24 showed mild OSA AHI, 8.4/hr  Reports that since stopping her imdur , she has noticed an increase in jaw, as well as chest pain. Has history of similar pain that was attributed to hypervolemia.  She returns today for heart failure follow up. Overall feeling frustarted. She has been struggling with chest pain and jaw pain for the last 3 years, unrelated to activity. It has gotten worse sense stopping imdur . NYHA I-II.  Denies dyspnea, fatigue, palpitations, and dizziness. Able to perform ADLs. Appetite okay. Weight at home stable. SBP at home has been 100-110. Compliant with all medications.  Cardiac Testing  - Echo (7/24): EF 45-50%, normal RV - Echo (5/21): EF 45-50% - Echo (3/20): EF 45-50% - Echo (10/19): EF 25-30% (2/2 to stopping GDMT) - Echo (7/18): EF 60-65% - Echo (3/17): EF 55-60%, grade I DD - Echo (1/16): EF 25-30% - Echo (5/14): EF 35% - Echo (2/14): EF 50% - Echo (1/14): EF 40% - Echo (12/13): EF 40% - Echo (1013): EF 40% - Echo (1/13): EF 55-60% - Echo (10/12): EF 45-50% - CPX 01/2014: Resting HR 75, Peak HR 121 Peak VO2 15.2, 74.4% predicted VE/VCO2 slope: 26.4 OUES: 1.93 Peak RER 1.15 VE/MVV 61.7%  SH: Married. Lives in  [place], 2 children. Working at [employer]. No ETOH or smoking  FH: Mother living: DM2, HTN, no CAD        Father living: DM2, HTN  Review of systems complete and found to be negative unless listed in HPI.   Past Medical History:  Diagnosis Date   AICD (automatic cardioverter/defibrillator) present    Anemia    2024 iron  infusions x 2, Follows w/ Dr. Guillermina Perla, hematology/oncology.   Asthma    asymptomatic   Atrial fibrillation (HCC)    hx of   CHF (congestive heart failure) (HCC)    Follows w/ Dr. Kathyanne, @ HF clinic. 01/29/23 Echo EF 45 - 50%   Chronic bronchitis (HCC)    2024 - Patient states she has not had bronchitis in many years.   Chronic systolic CHF (congestive heart failure), NYHA class 2 (HCC) 01/2006   Has MDT Defibrillator, EF had improved by 2017 echo   Gestational diabetes 2013   History of hiatal hernia    repaired 11/24/16   Hypertension    Follows w/ cardiologist.   Migraines    Has not had a migraine in many years per pt on 04/14/23.   Other primary cardiomyopathies    Paroxysmal ventricular tachycardia (HCC)    Pneumonia 09/04/2016   Community-acquired   Sleep apnea    Follows w/ Dr. Wilbert Bihari, cardiology. No cpap at the present (03/2023). Patient states she is waiting to be scheduled for a sleep  study.   Varicose veins 1998   Developed during last pregnancy   Current Outpatient Medications  Medication Sig Dispense Refill   bisoprolol  (ZEBETA ) 5 MG tablet Take 1 tablet (5 mg total) by mouth at bedtime. (Patient taking differently: Take 2.5 mg by mouth at bedtime.) 30 tablet 6   cetirizine (ZYRTEC) 10 MG tablet Take 10 mg by mouth at bedtime as needed for allergies.     citalopram  (CELEXA ) 20 MG tablet Take 1/2 tablets (10 mg total) by mouth daily for 10 days, THEN 1 tablet (20 mg total) daily. (Patient taking differently: Patient taking 1.5 tablets daily 30 mg for over 1 month) 85 tablet 0   cyclobenzaprine  (FLEXERIL ) 10 MG tablet Take 1/2-1  tablet (5-10 mg total) by mouth 2-3 times daily as needed. 30 tablet 1   isosorbide  mononitrate (IMDUR ) 30 MG 24 hr tablet Take 30 mg by mouth daily.     montelukast  (SINGULAIR ) 10 MG tablet Take 1 tablet (10 mg total) by mouth daily. 60 tablet 1   Multiple Vitamin (MULTIVITAMIN WITH MINERALS) TABS tablet Take 2 tablets by mouth daily. Chewable     nystatin -triamcinolone  (MYCOLOG II) cream Apply 1 application topically twice a day as directed for 14 days. (Patient taking differently: Apply 1 Application topically daily as needed.) 30 g 3   potassium chloride  SA (KLOR-CON  M20) 20 MEQ tablet Take 1 & 1/2 tablets (30 mEq total) by mouth 2 (two) times daily. 90 tablet 0   sacubitril -valsartan  (ENTRESTO ) 97-103 MG Take 1 tablet by mouth 2 (two) times daily. 180 tablet 3   spironolactone  (ALDACTONE ) 25 MG tablet Take 1 tablet (25 mg total) by mouth daily. 30 tablet 6   tirzepatide  (MOUNJARO ) 15 MG/0.5ML Pen Inject 15 mg into the skin once a week. 6 mL 1   torsemide  (DEMADEX ) 20 MG tablet Take 1 tablet (20 mg total) by mouth daily. 30 tablet 0   tirzepatide  (MOUNJARO ) 10 MG/0.5ML Pen Inject 10 mg into the skin once a week. (Patient not taking: Reported on 06/09/2024) 2 mL 1   tirzepatide  (MOUNJARO ) 12.5 MG/0.5ML Pen Inject 12.5 mg into the skin once a week. (Patient not taking: Reported on 06/09/2024) 2 mL 0   tirzepatide  (MOUNJARO ) 2.5 MG/0.5ML Pen Inject 2.5 mg into the skin once a week. (Patient not taking: Reported on 06/09/2024) 2 mL 0   tirzepatide  (MOUNJARO ) 5 MG/0.5ML Pen Inject 5 mg into the skin once a week. (Patient not taking: Reported on 06/09/2024) 2 mL 0   No current facility-administered medications for this encounter.   BP 100/70   Pulse 95   Ht 5' 1.25 (1.556 m)   Wt 87.5 kg (193 lb)   SpO2 98%   BMI 36.17 kg/m   Wt Readings from Last 3 Encounters:  06/09/24 87.5 kg (193 lb)  10/13/23 102.7 kg (226 lb 6.4 oz)  04/27/23 99.3 kg (219 lb)   PHYSICAL EXAM: General: Well  appearing. No distress  Cardiac: JVP flat. No murmurs  Abdomen: Soft, non-distended.  Extremities: Warm and dry.  No edema.  Neuro: A&O x3. Affect pleasant.   ECG (personally reviewed): NSR 91bpm, T wave abnorbalities  Device interrogation (personally reviewed): Optivol stable, no impedence. 0 VT/vf, 0 AF, <0.1% vp  ASSESSMENT & PLAN:  1. Chronic combined systolic/diastolic heart failure  - NICM  - s/p ICD likely r/t HTN,  - EF in 2016 25-30%.  - Cath 8/16 showed normal cors - Echo (7/18): EF 55-60% - Echo (10/19): EF 25-30% in setting  of medication noncompliance - Echo (3/20): EF 45-50% - Echo (5/21): EF 45-50% - Echo  (01/29/23): EF 45-50%  - NYHA II, euvolemic - Off SGLT2i with yeast infexctoins - continue torsemide  20 mg daily + 30 KCL bid  - decrease entresto  to 49/51 mg bid to increase imdur  and add renexa for chest pain - continue spiro 25 mg daily - continue bisoprolol  2.5 mg at bedtime  - repeat echo ordered - Labs stable  2. OSA  - Does not wear CPAP  - HST 9/24 showed mild OSA, AHI 8.4/hr - Follows with Dr. Shlomo  3. HTN - has been low, was taken off imdur ; plan as above - GDMT as above - Weight loss should help (see below)  4. Obesity - S/p laparoscopic sleeve gastrectomy with hiatal hernia repair, upper endoscopy.  - Body mass index is 36.17 kg/m. - Now on Mounjaro   5. Anemia - Felt to be 2/2 heavy menstrual bleeding - s/p abd hysterectomy - received IV Fe last year  6. Atypical Chest pain - radiates to jaw; not associated withactivity - previously noted during episodes of hypovolemic - last cath 2016 no CAD - ECG with T wave abnormalities - restart imdur  30 mg daily - start renexa 500 mg daily - refer to [facility] for further chest pain work up; ?cor vasospasm  7. SDOH - just lost her job, will lose coverage at the end of the month - consult to SW - refill medications for 90d supply  Follow up in 2 months with APP  Dezaree Tracey, NP  8:58 AM

## 2024-06-07 NOTE — Telephone Encounter (Signed)
 Patient called to report since stopping imdur  and hydralazine  has noticed an increase in CP,jaw pain, and arm pain  -patient reports above symptoms have been discussed with provider in the past (Jessica,NP) -questioned if she should restart imdur    Please advise

## 2024-06-08 ENCOUNTER — Other Ambulatory Visit (HOSPITAL_COMMUNITY): Payer: Self-pay

## 2024-06-08 ENCOUNTER — Telehealth (HOSPITAL_COMMUNITY): Payer: Self-pay

## 2024-06-08 LAB — CUP PACEART REMOTE DEVICE CHECK
Battery Remaining Longevity: 27 mo
Battery Voltage: 2.94 V
Brady Statistic RV Percent Paced: 0.01 %
Date Time Interrogation Session: 20251111103943
HighPow Impedance: 45 Ohm
HighPow Impedance: 54 Ohm
Implantable Lead Connection Status: 753985
Implantable Lead Implant Date: 20070718
Implantable Lead Location: 753860
Implantable Pulse Generator Implant Date: 20160215
Lead Channel Impedance Value: 285 Ohm
Lead Channel Impedance Value: 342 Ohm
Lead Channel Pacing Threshold Amplitude: 2.5 V
Lead Channel Pacing Threshold Pulse Width: 0.4 ms
Lead Channel Sensing Intrinsic Amplitude: 14.5 mV
Lead Channel Sensing Intrinsic Amplitude: 14.5 mV
Lead Channel Setting Pacing Amplitude: 3.5 V
Lead Channel Setting Pacing Pulse Width: 0.7 ms
Lead Channel Setting Sensing Sensitivity: 0.3 mV

## 2024-06-08 NOTE — Telephone Encounter (Signed)
 Called to confirm/remind patient of their appointment at the [facility] on 06/09/24 8:30.   Appointment:   [x] Confirmed  [] Left mess   [] No answer/No voice mail  [] VM Full/unable to leave message  [] Phone not in service  Patient reminded to bring all medications and/or complete list.  Confirmed patient has transportation. Gave directions, instructed to utilize valet parking.

## 2024-06-09 ENCOUNTER — Other Ambulatory Visit: Payer: Self-pay

## 2024-06-09 ENCOUNTER — Ambulatory Visit (HOSPITAL_COMMUNITY)
Admission: RE | Admit: 2024-06-09 | Discharge: 2024-06-09 | Disposition: A | Discharge status: Home / Self Care | Source: Ambulatory Visit | Attending: Cardiology | Admitting: Cardiology

## 2024-06-09 ENCOUNTER — Encounter (HOSPITAL_COMMUNITY): Payer: Self-pay

## 2024-06-09 ENCOUNTER — Other Ambulatory Visit (HOSPITAL_COMMUNITY): Payer: Self-pay

## 2024-06-09 ENCOUNTER — Ambulatory Visit (HOSPITAL_COMMUNITY): Payer: Self-pay | Admitting: Cardiology

## 2024-06-09 VITALS — BP 100/70 | HR 95 | Ht 61.25 in | Wt 193.0 lb

## 2024-06-09 DIAGNOSIS — I5021 Acute systolic (congestive) heart failure: Secondary | ICD-10-CM

## 2024-06-09 DIAGNOSIS — Z9884 Bariatric surgery status: Secondary | ICD-10-CM | POA: Insufficient documentation

## 2024-06-09 DIAGNOSIS — R6884 Jaw pain: Secondary | ICD-10-CM | POA: Insufficient documentation

## 2024-06-09 DIAGNOSIS — I428 Other cardiomyopathies: Secondary | ICD-10-CM | POA: Insufficient documentation

## 2024-06-09 DIAGNOSIS — I5042 Chronic combined systolic (congestive) and diastolic (congestive) heart failure: Secondary | ICD-10-CM | POA: Insufficient documentation

## 2024-06-09 DIAGNOSIS — I110 Hypertensive heart disease with heart failure: Secondary | ICD-10-CM | POA: Diagnosis not present

## 2024-06-09 DIAGNOSIS — G4733 Obstructive sleep apnea (adult) (pediatric): Secondary | ICD-10-CM | POA: Insufficient documentation

## 2024-06-09 DIAGNOSIS — R0789 Other chest pain: Secondary | ICD-10-CM | POA: Diagnosis not present

## 2024-06-09 DIAGNOSIS — D500 Iron deficiency anemia secondary to blood loss (chronic): Secondary | ICD-10-CM

## 2024-06-09 DIAGNOSIS — Z95810 Presence of automatic (implantable) cardiac defibrillator: Secondary | ICD-10-CM | POA: Insufficient documentation

## 2024-06-09 DIAGNOSIS — E669 Obesity, unspecified: Secondary | ICD-10-CM | POA: Diagnosis not present

## 2024-06-09 DIAGNOSIS — Z79899 Other long term (current) drug therapy: Secondary | ICD-10-CM | POA: Diagnosis not present

## 2024-06-09 DIAGNOSIS — J4489 Other specified chronic obstructive pulmonary disease: Secondary | ICD-10-CM | POA: Insufficient documentation

## 2024-06-09 DIAGNOSIS — Z6836 Body mass index (BMI) 36.0-36.9, adult: Secondary | ICD-10-CM | POA: Diagnosis not present

## 2024-06-09 LAB — BASIC METABOLIC PANEL WITH GFR
Anion gap: 10 (ref 5–15)
BUN: 20 mg/dL (ref 6–20)
CO2: 26 mmol/L (ref 22–32)
Calcium: 8.9 mg/dL (ref 8.9–10.3)
Chloride: 105 mmol/L (ref 98–111)
Creatinine, Ser: 1.56 mg/dL — ABNORMAL HIGH (ref 0.44–1.00)
GFR, Estimated: 40 mL/min — ABNORMAL LOW (ref >60)
Glucose, Bld: 90 mg/dL (ref 70–99)
Potassium: 4.5 mmol/L (ref 3.5–5.1)
Sodium: 141 mmol/L (ref 135–145)

## 2024-06-09 MED ORDER — SPIRONOLACTONE 25 MG PO TABS
25.0000 mg | ORAL_TABLET | Freq: Every day | ORAL | 3 refills | Status: DC
  Filled 2024-06-09: qty 90, 90d supply, fill #0

## 2024-06-09 MED ORDER — SACUBITRIL-VALSARTAN 49-51 MG PO TABS
1.0000 | ORAL_TABLET | Freq: Two times a day (BID) | ORAL | 3 refills | Status: DC
  Filled 2024-06-09: qty 180, 90d supply, fill #0

## 2024-06-09 MED ORDER — TORSEMIDE 20 MG PO TABS
20.0000 mg | ORAL_TABLET | Freq: Every day | ORAL | 0 refills | Status: DC
  Filled 2024-06-09: qty 30, 30d supply, fill #0

## 2024-06-09 MED ORDER — ISOSORBIDE MONONITRATE ER 30 MG PO TB24
30.0000 mg | ORAL_TABLET | Freq: Every day | ORAL | 3 refills | Status: DC
  Filled 2024-06-09: qty 90, 90d supply, fill #0

## 2024-06-09 MED ORDER — RANOLAZINE ER 500 MG PO TB12
500.0000 mg | ORAL_TABLET | Freq: Two times a day (BID) | ORAL | 3 refills | Status: AC
  Filled 2024-06-09: qty 180, 90d supply, fill #0

## 2024-06-09 MED ORDER — BISOPROLOL FUMARATE 5 MG PO TABS
2.5000 mg | ORAL_TABLET | Freq: Every evening | ORAL | 3 refills | Status: DC
  Filled 2024-06-09: qty 45, 90d supply, fill #0

## 2024-06-09 MED ORDER — SPIRONOLACTONE 25 MG PO TABS
25.0000 mg | ORAL_TABLET | Freq: Every day | ORAL | 6 refills | Status: DC
  Filled 2024-06-09: qty 30, 30d supply, fill #0

## 2024-06-09 MED ORDER — POTASSIUM CHLORIDE CRYS ER 20 MEQ PO TBCR
30.0000 meq | EXTENDED_RELEASE_TABLET | Freq: Two times a day (BID) | ORAL | 3 refills | Status: DC
  Filled 2024-06-09: qty 90, 30d supply, fill #0

## 2024-06-09 NOTE — Progress Notes (Signed)
 H&V Care Navigation CSW Progress Note  Clinical Social Worker informed pt losing insurance at the end of this month and wondering how to move forward getting medications.  Pt states that she lost her job in Oct and will lose insurance at the end of this month.   Clinic providing with 90 day of med refills to assist with bridging gap.  Pt hopeful to get new employment with benefits but currently working 12 hours a week for $10/hour.  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  She will speak with Medicaid case workers to assess when she might meet medicaid eligibility.  Discussed [employer] insurance and encouraged to check on pricing but any copay would be difficult given her low income at this time.  Encouraged pt to call us  when she runs low on medications and we could assist with HF fund on eligible medications.  Also encouraged her to reach out to other providers to discuss what options will be available for assistance with medications they prescribe.  CSW will continue to follow and assist as needed   Briana HILARIO Leech, LCSW Clinical Social Worker [facility] Clinic Desk#: [phone] Cell#: [phone]

## 2024-06-09 NOTE — Addendum Note (Signed)
 Encounter addended by: Shaletha Humble, NP on: 06/09/2024 10:19 AM  Actions taken: Clinical Note Signed

## 2024-06-09 NOTE — Patient Instructions (Addendum)
 CHANGE Entresto  to 49/51 mg Twice daily  RESTART Imdur  at 30 mg daily.  START Ranexa 500 mg Twice daily  Labs done today, your results will be available in MyChart, we will contact you for abnormal readings.  Your physician has requested that you have an echocardiogram. Echocardiography is a painless test that uses sound waves to create images of your heart. It provides your doctor with information about the size and shape of your heart and how well your heart's chambers and valves are working. This procedure takes approximately one hour. There are no restrictions for this procedure. Please do NOT wear cologne, perfume, aftershave, or lotions (deodorant is allowed). Please arrive 15 minutes prior to your appointment time.  Please note: We ask at that you not bring children with you during ultrasound (echo/ vascular) testing. Due to room size and safety concerns, children are not allowed in the ultrasound rooms during exams. Our front office staff cannot provide observation of children in our lobby area while testing is being conducted. An adult accompanying a patient to their appointment will only be allowed in the ultrasound room at the discretion of the ultrasound technician under special circumstances. We apologize for any inconvenience.  You have been referred to the Interventional Cardiology. They will call you to arrange your appointment.  Your physician recommends that you schedule a follow-up appointment in: 2 months.  If you have any questions or concerns before your next appointment please send us  a message through [place] or call our office at [phone].    TO LEAVE A MESSAGE FOR THE NURSE SELECT OPTION 2, PLEASE LEAVE A MESSAGE INCLUDING: YOUR NAME DATE OF BIRTH CALL BACK NUMBER REASON FOR CALL**this is important as we prioritize the call backs  YOU WILL RECEIVE A CALL BACK THE SAME DAY AS LONG AS YOU CALL BEFORE 4:00 PM  At the [facility], you and your  health needs are our priority. As part of our continuing mission to provide you with exceptional heart care, we have created designated Provider Care Teams. These Care Teams include your primary Cardiologist (physician) and Advanced Practice Providers (APPs- Physician Assistants and Nurse Practitioners) who all work together to provide you with the care you need, when you need it.   You may see any of the following providers on your designated Care Team at your next follow up: Dr Toribio Fuel Dr Ezra Shuck Dr. Morene Brownie Greig Mosses, NP Caffie Shed, [place] [facility] [place], [place] Beckey Coe, NP Jordan Lee, NP Ellouise Class, NP Tinnie Redman, PharmD Jaun Bash, PharmD   Please be sure to bring in all your medications bottles to every appointment.    Thank you for choosing [facility]

## 2024-06-09 NOTE — Progress Notes (Signed)
 Remote ICD Transmission

## 2024-06-11 ENCOUNTER — Encounter (HOSPITAL_COMMUNITY): Payer: Self-pay

## 2024-06-11 ENCOUNTER — Ambulatory Visit: Payer: Self-pay | Admitting: Internal Medicine

## 2024-06-12 ENCOUNTER — Other Ambulatory Visit (HOSPITAL_COMMUNITY): Payer: Self-pay

## 2024-06-12 ENCOUNTER — Other Ambulatory Visit (HOSPITAL_COMMUNITY): Payer: Self-pay | Admitting: Cardiology

## 2024-06-12 MED ORDER — TORSEMIDE 20 MG PO TABS
20.0000 mg | ORAL_TABLET | Freq: Every day | ORAL | 0 refills | Status: DC
  Filled 2024-06-12: qty 90, 90d supply, fill #0

## 2024-06-12 MED ORDER — POTASSIUM CHLORIDE CRYS ER 20 MEQ PO TBCR
30.0000 meq | EXTENDED_RELEASE_TABLET | Freq: Two times a day (BID) | ORAL | 0 refills | Status: DC
  Filled 2024-06-12: qty 270, 90d supply, fill #0

## 2024-06-12 NOTE — Telephone Encounter (Signed)
 Patient seen in clinic 11/13 Ok to close encounter

## 2024-06-12 NOTE — Telephone Encounter (Signed)
 Patient seen in office 11/13 Ok to close encounter

## 2024-06-20 ENCOUNTER — Other Ambulatory Visit (HOSPITAL_COMMUNITY): Payer: Self-pay

## 2024-06-20 ENCOUNTER — Ambulatory Visit (HOSPITAL_COMMUNITY)
Admission: RE | Admit: 2024-06-20 | Discharge: 2024-06-20 | Disposition: A | Discharge status: Home / Self Care | Source: Ambulatory Visit | Attending: Family Medicine | Admitting: Family Medicine

## 2024-06-20 DIAGNOSIS — Z950 Presence of cardiac pacemaker: Secondary | ICD-10-CM | POA: Diagnosis not present

## 2024-06-20 DIAGNOSIS — I5021 Acute systolic (congestive) heart failure: Secondary | ICD-10-CM | POA: Insufficient documentation

## 2024-06-20 LAB — ECHOCARDIOGRAM COMPLETE
Calc EF: 50.1 %
MV VTI: 5.22 cm2
S' Lateral: 3.1 cm
Single Plane A2C EF: 48.1 %
Single Plane A4C EF: 53.8 %

## 2024-06-20 MED ORDER — CITALOPRAM HYDROBROMIDE 20 MG PO TABS
ORAL_TABLET | ORAL | 0 refills | Status: AC
  Filled 2024-06-20: qty 45, 90d supply, fill #0
  Filled 2024-06-23: qty 85, 90d supply, fill #0
  Filled 2024-07-06: qty 90, 90d supply, fill #0

## 2024-06-20 NOTE — Progress Notes (Signed)
  Echocardiogram 2D Echocardiogram has been performed.  [name] 06/20/2024, 8:29 AM

## 2024-06-23 ENCOUNTER — Other Ambulatory Visit (HOSPITAL_COMMUNITY): Payer: Self-pay

## 2024-07-04 ENCOUNTER — Encounter (HOSPITAL_COMMUNITY): Payer: Self-pay | Admitting: *Deleted

## 2024-07-06 ENCOUNTER — Other Ambulatory Visit (HOSPITAL_COMMUNITY): Payer: Self-pay

## 2024-07-06 ENCOUNTER — Encounter: Payer: Self-pay | Admitting: Oncology

## 2024-07-17 ENCOUNTER — Encounter: Payer: Self-pay | Admitting: Internal Medicine

## 2024-07-21 ENCOUNTER — Encounter

## 2024-08-02 ENCOUNTER — Encounter: Payer: Self-pay | Admitting: Oncology

## 2024-08-08 ENCOUNTER — Telehealth (HOSPITAL_COMMUNITY): Payer: Self-pay

## 2024-08-08 NOTE — Telephone Encounter (Signed)
 Called to confirm/remind patient of their appointment at the [facility] on 08/09/24.   Appointment:   [x] Confirmed  [] Left mess   [] No answer/No voice mail  [] VM Full/unable to leave message  [] Phone not in service  Patient reminded to bring all medications and/or complete list.  Confirmed patient has transportation. Gave directions, instructed to utilize valet parking.

## 2024-08-08 NOTE — Progress Notes (Signed)
 "   [facility] Note   PCP: Dr. Luke Lamer  OB:  Dr. Raeanne  EP: Dr. Waddell HF Cardiologist: Dr. Cherrie  HPI: [name] is a 49 y.o. female with h/o obesity, chronic systolic heart failure due to nonischemic cardiomyopathy (cath 2007. No CAD) s/p Med[employer] ICD implantation - now with recovered EF, asthma/chronic bronchitic, gastric sleeve, and severe HTN  Echo 7/18 EF 60-65%   Echo 10/19 EF 25-30% (in setting of stopping meds). Meds were resumed. Echo repeated 09/2018 and EF improved to 45-50%.   Follow up 9/22, stable NYHA II and volume stable.  On good GDMT, planned for follow up with repeat echo x 6 months, unfortunately lost to follow up.  S/p abdominal hysterectomy with bilateral salpingectomy 10/24.  HST 9/24 showed mild OSA AHI, 8.4/hr  Reports that since stopping her imdur , she has noticed an increase in jaw, as well as chest pain. Has history of similar pain that was attributed to hypervolemia.  She returns today for heart failure follow up. Overall feeling frustarted. She has been struggling with chest pain and jaw pain for the last 3 years, unrelated to activity. It has gotten worse sense stopping imdur . NYHA I-II.  Denies dyspnea, fatigue, palpitations, and dizziness. Able to perform ADLs. Appetite okay. Weight at home stable. SBP at home has been 100-110. Compliant with all medications.  Cardiac Testing  - Echo (7/24): EF 45-50%, normal RV - Echo (5/21): EF 45-50% - Echo (3/20): EF 45-50% - Echo (10/19): EF 25-30% (2/2 to stopping GDMT) - Echo (7/18): EF 60-65% - Echo (3/17): EF 55-60%, grade I DD - Echo (1/16): EF 25-30% - Echo (5/14): EF 35% - Echo (2/14): EF 50% - Echo (1/14): EF 40% - Echo (12/13): EF 40% - Echo (1013): EF 40% - Echo (1/13): EF 55-60% - Echo (10/12): EF 45-50% - CPX 01/2014: Resting HR 75, Peak HR 121 Peak VO2 15.2, 74.4% predicted VE/VCO2 slope: 26.4 OUES: 1.93 Peak RER 1.15 VE/MVV 61.7%  SH: Married. Lives in  [place], 2 children. Working at [employer]. No ETOH or smoking  FH: Mother living: DM2, HTN, no CAD        Father living: DM2, HTN  Review of systems complete and found to be negative unless listed in HPI.   Past Medical History:  Diagnosis Date   AICD (automatic cardioverter/defibrillator) present    Anemia    2024 iron  infusions x 2, Follows w/ Dr. Guillermina Perla, hematology/oncology.   Asthma    asymptomatic   Atrial fibrillation (HCC)    hx of   CHF (congestive heart failure) (HCC)    Follows w/ Dr. Kathyanne, @ HF clinic. 01/29/23 Echo EF 45 - 50%   Chronic bronchitis (HCC)    2024 - Patient states she has not had bronchitis in many years.   Chronic systolic CHF (congestive heart failure), NYHA class 2 (HCC) 01/2006   Has MDT Defibrillator, EF had improved by 2017 echo   Gestational diabetes 2013   History of hiatal hernia    repaired 11/24/16   Hypertension    Follows w/ cardiologist.   Migraines    Has not had a migraine in many years per pt on 04/14/23.   Other primary cardiomyopathies    Paroxysmal ventricular tachycardia (HCC)    Pneumonia 09/04/2016   Community-acquired   Sleep apnea    Follows w/ Dr. Wilbert Bihari, cardiology. No cpap at the present (03/2023). Patient states she is waiting to be scheduled for  a sleep study.   Varicose veins 1998   Developed during last pregnancy   Current Outpatient Medications  Medication Sig Dispense Refill   bisoprolol  (ZEBETA ) 5 MG tablet Take 1/2 tablets (2.5 mg total) by mouth at bedtime. 45 tablet 3   cetirizine (ZYRTEC) 10 MG tablet Take 10 mg by mouth at bedtime as needed for allergies.     citalopram  (CELEXA ) 20 MG tablet Take 1/2 tablet (10 mg total) by mouth daily for 10 days, THEN 1 tablet (20 mg total) daily. 90 tablet 0   cyclobenzaprine  (FLEXERIL ) 10 MG tablet Take 1/2-1 tablet (5-10 mg total) by mouth 2-3 times daily as needed. 30 tablet 1   isosorbide  mononitrate (IMDUR ) 30 MG 24 hr tablet Take 1 tablet (30 mg  total) by mouth daily. 90 tablet 3   montelukast  (SINGULAIR ) 10 MG tablet Take 1 tablet (10 mg total) by mouth daily. 60 tablet 1   Multiple Vitamin (MULTIVITAMIN WITH MINERALS) TABS tablet Take 2 tablets by mouth daily. Chewable     nystatin -triamcinolone  (MYCOLOG II) cream Apply 1 application topically twice a day as directed for 14 days. (Patient taking differently: Apply 1 Application topically daily as needed.) 30 g 3   potassium chloride  SA (KLOR-CON  M20) 20 MEQ tablet Take 1 & 1/2 tablets (30 mEq total) by mouth 2 (two) times daily. 270 tablet 0   ranolazine  (RANEXA ) 500 MG 12 hr tablet Take 1 tablet (500 mg total) by mouth 2 (two) times daily. 180 tablet 3   sacubitril -valsartan  (ENTRESTO ) 49-51 MG Take 1 tablet by mouth 2 (two) times daily. 180 tablet 3   spironolactone  (ALDACTONE ) 25 MG tablet Take 1 tablet (25 mg total) by mouth daily. 90 tablet 3   tirzepatide  (MOUNJARO ) 10 MG/0.5ML Pen Inject 10 mg into the skin once a week. (Patient not taking: Reported on 06/09/2024) 2 mL 1   tirzepatide  (MOUNJARO ) 12.5 MG/0.5ML Pen Inject 12.5 mg into the skin once a week. (Patient not taking: Reported on 06/09/2024) 2 mL 0   tirzepatide  (MOUNJARO ) 15 MG/0.5ML Pen Inject 15 mg into the skin once a week. 6 mL 1   tirzepatide  (MOUNJARO ) 2.5 MG/0.5ML Pen Inject 2.5 mg into the skin once a week. (Patient not taking: Reported on 06/09/2024) 2 mL 0   tirzepatide  (MOUNJARO ) 5 MG/0.5ML Pen Inject 5 mg into the skin once a week. (Patient not taking: Reported on 06/09/2024) 2 mL 0   torsemide  (DEMADEX ) 20 MG tablet Take 1 tablet (20 mg total) by mouth daily. 90 tablet 0   No current facility-administered medications for this visit.   There were no vitals taken for this visit.  Wt Readings from Last 3 Encounters:  06/09/24 87.5 kg (193 lb)  10/13/23 102.7 kg (226 lb 6.4 oz)  04/27/23 99.3 kg (219 lb)   PHYSICAL EXAM: General: Well appearing. No distress  Cardiac: JVP flat. No murmurs  Abdomen: Soft,  non-distended.  Extremities: Warm and dry.  No edema.  Neuro: A&O x3. Affect pleasant.   ECG (personally reviewed): NSR 91bpm, T wave abnorbalities  Device interrogation (personally reviewed): Optivol stable, no impedence. 0 VT/vf, 0 AF, <0.1% vp  ASSESSMENT & PLAN:  1. Chronic combined systolic/diastolic heart failure  - NICM  - s/p ICD likely r/t HTN,  - EF in 2016 25-30%.  - Cath 8/16 showed normal cors - Echo (7/18): EF 55-60% - Echo (10/19): EF 25-30% in setting of medication noncompliance - Echo (3/20): EF 45-50% - Echo (5/21): EF 45-50% - Echo  (  01/29/23): EF 45-50%  - NYHA II, euvolemic - Off SGLT2i with yeast infexctoins - continue torsemide  20 mg daily + 30 KCL bid  - decrease entresto  to 49/51 mg bid to increase imdur  and add renexa for chest pain - continue spiro 25 mg daily - continue bisoprolol  2.5 mg at bedtime  - repeat echo ordered - Labs stable  2. OSA  - Does not wear CPAP  - HST 9/24 showed mild OSA, AHI 8.4/hr - Follows with Dr. Shlomo  3. HTN - has been low, was taken off imdur ; plan as above - GDMT as above - Weight loss should help (see below)  4. Obesity - S/p laparoscopic sleeve gastrectomy with hiatal hernia repair, upper endoscopy.  - There is no height or weight on file to calculate BMI. - Now on Mounjaro   5. Anemia - Felt to be 2/2 heavy menstrual bleeding - s/p abd hysterectomy - received IV Fe last year  6. Atypical Chest pain - radiates to jaw; not associated withactivity - previously noted during episodes of hypovolemic - last cath 2016 no CAD - ECG with T wave abnormalities - restart imdur  30 mg daily - start renexa 500 mg daily - refer to [facility] for further chest pain work up; ?cor vasospasm  7. SDOH - just lost her job, will lose coverage at the end of the month - consult to SW - refill medications for 90d supply  Follow up in 2 months with APP  Harlene CHRISTELLA Gainer, FNP  9:32 AM "

## 2024-08-09 ENCOUNTER — Ambulatory Visit (HOSPITAL_COMMUNITY)
Admission: RE | Admit: 2024-08-09 | Discharge: 2024-08-09 | Disposition: A | Discharge status: Home / Self Care | Payer: Self-pay | Source: Ambulatory Visit | Attending: Family Medicine | Admitting: Family Medicine

## 2024-08-09 ENCOUNTER — Other Ambulatory Visit (HOSPITAL_COMMUNITY): Payer: Self-pay

## 2024-08-09 ENCOUNTER — Ambulatory Visit (HOSPITAL_COMMUNITY)
Admission: RE | Admit: 2024-08-09 | Discharge: 2024-08-09 | Disposition: A | Discharge status: Home / Self Care | Payer: Self-pay | Source: Ambulatory Visit | Attending: Cardiology | Admitting: Cardiology

## 2024-08-09 ENCOUNTER — Ambulatory Visit (HOSPITAL_COMMUNITY): Payer: Self-pay | Admitting: Family Medicine

## 2024-08-09 ENCOUNTER — Other Ambulatory Visit (HOSPITAL_COMMUNITY): Payer: Self-pay | Admitting: Cardiology

## 2024-08-09 ENCOUNTER — Encounter (HOSPITAL_COMMUNITY): Payer: Self-pay

## 2024-08-09 VITALS — BP 100/74 | HR 92 | Wt 176.0 lb

## 2024-08-09 DIAGNOSIS — I5022 Chronic systolic (congestive) heart failure: Secondary | ICD-10-CM

## 2024-08-09 DIAGNOSIS — I428 Other cardiomyopathies: Secondary | ICD-10-CM | POA: Insufficient documentation

## 2024-08-09 DIAGNOSIS — R002 Palpitations: Secondary | ICD-10-CM

## 2024-08-09 DIAGNOSIS — Z95810 Presence of automatic (implantable) cardiac defibrillator: Secondary | ICD-10-CM | POA: Insufficient documentation

## 2024-08-09 DIAGNOSIS — D500 Iron deficiency anemia secondary to blood loss (chronic): Secondary | ICD-10-CM

## 2024-08-09 DIAGNOSIS — Z139 Encounter for screening, unspecified: Secondary | ICD-10-CM

## 2024-08-09 DIAGNOSIS — G4733 Obstructive sleep apnea (adult) (pediatric): Secondary | ICD-10-CM | POA: Insufficient documentation

## 2024-08-09 DIAGNOSIS — I110 Hypertensive heart disease with heart failure: Secondary | ICD-10-CM | POA: Insufficient documentation

## 2024-08-09 DIAGNOSIS — Z5971 Insufficient health insurance coverage: Secondary | ICD-10-CM | POA: Insufficient documentation

## 2024-08-09 DIAGNOSIS — Z79899 Other long term (current) drug therapy: Secondary | ICD-10-CM | POA: Insufficient documentation

## 2024-08-09 DIAGNOSIS — R0609 Other forms of dyspnea: Secondary | ICD-10-CM | POA: Insufficient documentation

## 2024-08-09 DIAGNOSIS — Z9884 Bariatric surgery status: Secondary | ICD-10-CM | POA: Insufficient documentation

## 2024-08-09 DIAGNOSIS — I10 Essential (primary) hypertension: Secondary | ICD-10-CM

## 2024-08-09 DIAGNOSIS — I5042 Chronic combined systolic (congestive) and diastolic (congestive) heart failure: Secondary | ICD-10-CM | POA: Insufficient documentation

## 2024-08-09 DIAGNOSIS — J45909 Unspecified asthma, uncomplicated: Secondary | ICD-10-CM | POA: Insufficient documentation

## 2024-08-09 DIAGNOSIS — D649 Anemia, unspecified: Secondary | ICD-10-CM | POA: Insufficient documentation

## 2024-08-09 DIAGNOSIS — R0789 Other chest pain: Secondary | ICD-10-CM | POA: Insufficient documentation

## 2024-08-09 LAB — CBC
HCT: 40.2 % (ref 36.0–46.0)
Hemoglobin: 13.2 g/dL (ref 12.0–15.0)
MCH: 29.1 pg (ref 26.0–34.0)
MCHC: 32.8 g/dL (ref 30.0–36.0)
MCV: 88.7 fL (ref 80.0–100.0)
Platelets: 227 K/uL (ref 150–400)
RBC: 4.53 MIL/uL (ref 3.87–5.11)
RDW: 14.9 % (ref 11.5–15.5)
WBC: 5.5 K/uL (ref 4.0–10.5)
nRBC: 0 % (ref 0.0–0.2)

## 2024-08-09 LAB — BASIC METABOLIC PANEL WITH GFR
Anion gap: 12 (ref 5–15)
BUN: 24 mg/dL — ABNORMAL HIGH (ref 6–20)
CO2: 25 mmol/L (ref 22–32)
Calcium: 9.5 mg/dL (ref 8.9–10.3)
Chloride: 103 mmol/L (ref 98–111)
Creatinine, Ser: 1.87 mg/dL — ABNORMAL HIGH (ref 0.44–1.00)
GFR, Estimated: 32 mL/min — ABNORMAL LOW
Glucose, Bld: 93 mg/dL (ref 70–99)
Potassium: 5.1 mmol/L (ref 3.5–5.1)
Sodium: 140 mmol/L (ref 135–145)

## 2024-08-09 LAB — FERRITIN: Ferritin: 388 ng/mL — ABNORMAL HIGH (ref 11–307)

## 2024-08-09 LAB — IRON AND TIBC
Iron: 66 ug/dL (ref 28–170)
Saturation Ratios: 26 % (ref 10.4–31.8)
TIBC: 259 ug/dL (ref 250–450)
UIBC: 193 ug/dL

## 2024-08-09 LAB — TSH: TSH: 2.09 u[IU]/mL (ref 0.350–4.500)

## 2024-08-09 MED ORDER — SPIRONOLACTONE 25 MG PO TABS
25.0000 mg | ORAL_TABLET | Freq: Every day | ORAL | 3 refills | Status: AC
  Filled 2024-08-09: qty 90, 90d supply, fill #0

## 2024-08-09 MED ORDER — POTASSIUM CHLORIDE CRYS ER 20 MEQ PO TBCR
30.0000 meq | EXTENDED_RELEASE_TABLET | Freq: Two times a day (BID) | ORAL | 3 refills | Status: DC
  Filled 2024-08-09: qty 270, 90d supply, fill #0

## 2024-08-09 MED ORDER — BISOPROLOL FUMARATE 5 MG PO TABS
2.5000 mg | ORAL_TABLET | Freq: Every evening | ORAL | 3 refills | Status: AC
  Filled 2024-08-09: qty 45, 90d supply, fill #0

## 2024-08-09 MED ORDER — TORSEMIDE 20 MG PO TABS
20.0000 mg | ORAL_TABLET | Freq: Every day | ORAL | 3 refills | Status: DC
  Filled 2024-08-09: qty 90, 90d supply, fill #0

## 2024-08-09 MED ORDER — ISOSORBIDE MONONITRATE ER 30 MG PO TB24
30.0000 mg | ORAL_TABLET | Freq: Every day | ORAL | 3 refills | Status: AC
  Filled 2024-08-09: qty 90, 90d supply, fill #0

## 2024-08-09 MED ORDER — SACUBITRIL-VALSARTAN 24-26 MG PO TABS: 1.0000 | ORAL_TABLET | Freq: Two times a day (BID) | ORAL | Status: AC

## 2024-08-09 NOTE — Progress Notes (Signed)
 H&V Care Navigation CSW Progress Note  Clinical Social Worker met with pt to discuss insurance.  Patient now without insurance- thinks she might qualify for Medicaid now so will plan to go see [employer] workers at [facility] to apply.  Getting meds through HF Fund.  Provided with [employer] to assist with [facility] bills- informed how to apply and offered to assist in turning in if she wanted to bring back to clinic when finished.  Will continue to follow and assist as needed  Briana HILARIO Leech, LCSW Clinical Social Worker [facility] Desk#: [phone] Cell#: [phone]

## 2024-08-09 NOTE — Patient Instructions (Signed)
 Decrease Entresto  to 24/26 mg twice daily - updated Rx sent (asked them not to fill at this time). Refills sent for all cardiac meds requesting Heart Failure fund to pay for them - see below. Labs today - will call you if abnormal. Heart monitor supplied today. Your provider has recommended that  you wear a Zio Patch for 3 days.  This monitor will record your heart rhythm for our review.  IF you have any symptoms while wearing the monitor please press the button.  If you have any issues with the patch or you notice a red or orange light on it please call the company at [phone].  Once you remove the patch please mail it back to the company as soon as possible so we can get the results. Return to see Dr. Rolan in 6 months.  PLEASE CALL [phone] END OF JUNE TO SCHEDULE THIS APPOINTMENT IF YOU HAVE NOT HEARD FROM OUR OFFICE. Please call us  at [phone] if any questions or concerns prior to your next appointment.    Zio patch placed onto patient.  All instructions and information reviewed with patient, they verbalize understanding with no questions.

## 2024-08-11 ENCOUNTER — Other Ambulatory Visit (HOSPITAL_COMMUNITY): Payer: Self-pay

## 2024-08-11 MED ORDER — POTASSIUM CHLORIDE CRYS ER 20 MEQ PO TBCR
20.0000 meq | EXTENDED_RELEASE_TABLET | ORAL | 3 refills | Status: AC | PRN
  Filled 2024-08-11: qty 270, fill #0

## 2024-08-11 MED ORDER — TORSEMIDE 20 MG PO TABS: 20.0000 mg | ORAL_TABLET | ORAL | Status: AC | PRN

## 2024-08-11 NOTE — Telephone Encounter (Addendum)
 Pt aware, agreeable, and verbalized understanding   ----- Message from Harlene CHRISTELLA Gainer sent at 08/11/2024 11:14 AM EST ----- Renal function elevated.  Change torsemide  20 mg to PRN only, change 20 KCL to PRN only when taking torsemide  (previously was on 30 KCL daily)

## 2024-08-24 ENCOUNTER — Ambulatory Visit (HOSPITAL_COMMUNITY): Payer: Self-pay | Admitting: Cardiology

## 2024-08-24 NOTE — Telephone Encounter (Addendum)
 Pt aware, agreeable, and verbalized understanding  ----- Message from Ezra Shuck, MD sent at 08/24/2024 11:14 AM EST ----- 2 short NSVT runs.  Continue current medical therapy.

## 2024-08-28 ENCOUNTER — Telehealth (HOSPITAL_COMMUNITY): Payer: Self-pay | Admitting: Licensed Clinical Social Worker

## 2024-08-28 NOTE — Telephone Encounter (Signed)
 H&V Care Navigation CSW Progress Note  Clinical Social Worker called pt to check in regarding lack of insurance.  States she attempted to go by [facility] but they were not open.  Now working till 5 so would not be able to go in person easily.  Agreeable to CSW emailing [employer] workers to reach out for [employer] app- email sent.  Will continue to follow and assist as needed  Andriette HILARIO Leech, LCSW Clinical Social Worker [facility] Desk#: [phone] Cell#: [phone]

## 2024-09-05 ENCOUNTER — Ambulatory Visit: Payer: Self-pay

## 2024-10-20 ENCOUNTER — Encounter

## 2024-12-05 ENCOUNTER — Ambulatory Visit: Payer: Self-pay

## 2025-01-19 ENCOUNTER — Encounter

## 2025-03-06 ENCOUNTER — Ambulatory Visit: Payer: Self-pay

## 2025-06-05 ENCOUNTER — Ambulatory Visit: Payer: Self-pay

## 2025-09-04 ENCOUNTER — Ambulatory Visit: Payer: Self-pay
